# Patient Record
Sex: Female | Born: 1935 | Race: Black or African American | Hispanic: No | Smoking: Never smoker
Health system: Southern US, Community
[De-identification: ages and names within clinical notes are randomized; demographics above are authoritative.]

## PROBLEM LIST (undated history)

## (undated) DIAGNOSIS — IMO0001 Reserved for inherently not codable concepts without codable children: Secondary | ICD-10-CM

## (undated) DIAGNOSIS — F32A Depression, unspecified: Secondary | ICD-10-CM

## (undated) DIAGNOSIS — I701 Atherosclerosis of renal artery: Secondary | ICD-10-CM

## (undated) DIAGNOSIS — M549 Dorsalgia, unspecified: Secondary | ICD-10-CM

## (undated) DIAGNOSIS — D649 Anemia, unspecified: Secondary | ICD-10-CM

## (undated) DIAGNOSIS — I2510 Atherosclerotic heart disease of native coronary artery without angina pectoris: Secondary | ICD-10-CM

## (undated) DIAGNOSIS — F329 Major depressive disorder, single episode, unspecified: Secondary | ICD-10-CM

## (undated) DIAGNOSIS — K219 Gastro-esophageal reflux disease without esophagitis: Secondary | ICD-10-CM

## (undated) DIAGNOSIS — G8929 Other chronic pain: Secondary | ICD-10-CM

## (undated) DIAGNOSIS — E785 Hyperlipidemia, unspecified: Secondary | ICD-10-CM

## (undated) DIAGNOSIS — Z9049 Acquired absence of other specified parts of digestive tract: Secondary | ICD-10-CM

## (undated) DIAGNOSIS — Z794 Long term (current) use of insulin: Secondary | ICD-10-CM

## (undated) DIAGNOSIS — E119 Type 2 diabetes mellitus without complications: Secondary | ICD-10-CM

## (undated) DIAGNOSIS — I509 Heart failure, unspecified: Secondary | ICD-10-CM

## (undated) DIAGNOSIS — Z9089 Acquired absence of other organs: Secondary | ICD-10-CM

## (undated) DIAGNOSIS — I10 Essential (primary) hypertension: Secondary | ICD-10-CM

## (undated) DIAGNOSIS — G44209 Tension-type headache, unspecified, not intractable: Secondary | ICD-10-CM

## (undated) HISTORY — DX: Tension-type headache, unspecified, not intractable: G44.209

## (undated) HISTORY — DX: Hyperlipidemia, unspecified: E78.5

## (undated) HISTORY — DX: Reserved for inherently not codable concepts without codable children: IMO0001

## (undated) HISTORY — DX: Atherosclerosis of renal artery: I70.1

## (undated) HISTORY — DX: Acquired absence of other specified parts of digestive tract: Z90.49

## (undated) HISTORY — DX: Atherosclerotic heart disease of native coronary artery without angina pectoris: I25.10

## (undated) HISTORY — PX: TONSILLECTOMY: SUR1361

## (undated) HISTORY — DX: Dorsalgia, unspecified: M54.9

## (undated) HISTORY — DX: Anemia, unspecified: D64.9

## (undated) HISTORY — DX: Depression, unspecified: F32.A

## (undated) HISTORY — DX: Essential (primary) hypertension: I10

## (undated) HISTORY — DX: Gastro-esophageal reflux disease without esophagitis: K21.9

## (undated) HISTORY — DX: Acquired absence of other organs: Z90.89

## (undated) HISTORY — DX: Other chronic pain: G89.29

## (undated) HISTORY — DX: Major depressive disorder, single episode, unspecified: F32.9

## (undated) HISTORY — PX: CHOLECYSTECTOMY: SHX55

## (undated) HISTORY — DX: Type 2 diabetes mellitus without complications: E11.9

## (undated) HISTORY — DX: Long term (current) use of insulin: Z79.4

---

## 1997-11-25 ENCOUNTER — Ambulatory Visit: Admission: RE | Admit: 1997-11-25 | Discharge: 1997-11-25 | Payer: Self-pay | Admitting: Emergency Medicine

## 1998-03-01 ENCOUNTER — Other Ambulatory Visit: Admission: RE | Admit: 1998-03-01 | Discharge: 1998-03-01 | Payer: Self-pay | Admitting: Obstetrics and Gynecology

## 1998-04-03 ENCOUNTER — Ambulatory Visit (HOSPITAL_COMMUNITY): Admission: RE | Admit: 1998-04-03 | Discharge: 1998-04-03 | Payer: Self-pay | Admitting: Obstetrics and Gynecology

## 1998-05-17 ENCOUNTER — Ambulatory Visit (HOSPITAL_COMMUNITY): Admission: RE | Admit: 1998-05-17 | Discharge: 1998-05-17 | Payer: Self-pay | Admitting: Gastroenterology

## 1999-03-27 ENCOUNTER — Other Ambulatory Visit: Admission: RE | Admit: 1999-03-27 | Discharge: 1999-03-27 | Payer: Self-pay | Admitting: Internal Medicine

## 1999-11-13 ENCOUNTER — Emergency Department (HOSPITAL_COMMUNITY): Admission: EM | Admit: 1999-11-13 | Discharge: 1999-11-13 | Payer: Self-pay | Admitting: Emergency Medicine

## 2000-08-07 ENCOUNTER — Encounter: Admission: RE | Admit: 2000-08-07 | Discharge: 2000-08-07 | Payer: Self-pay | Admitting: Internal Medicine

## 2000-08-07 ENCOUNTER — Encounter: Payer: Self-pay | Admitting: Internal Medicine

## 2001-05-18 ENCOUNTER — Encounter: Payer: Self-pay | Admitting: Internal Medicine

## 2001-05-18 ENCOUNTER — Emergency Department (HOSPITAL_COMMUNITY): Admission: EM | Admit: 2001-05-18 | Discharge: 2001-05-18 | Payer: Self-pay | Admitting: Emergency Medicine

## 2001-08-10 ENCOUNTER — Encounter: Payer: Self-pay | Admitting: Internal Medicine

## 2001-08-10 ENCOUNTER — Encounter: Admission: RE | Admit: 2001-08-10 | Discharge: 2001-08-10 | Payer: Self-pay | Admitting: Internal Medicine

## 2002-08-26 ENCOUNTER — Encounter: Payer: Self-pay | Admitting: Internal Medicine

## 2002-08-26 ENCOUNTER — Encounter: Admission: RE | Admit: 2002-08-26 | Discharge: 2002-08-26 | Payer: Self-pay | Admitting: Internal Medicine

## 2002-09-25 ENCOUNTER — Emergency Department (HOSPITAL_COMMUNITY): Admission: EM | Admit: 2002-09-25 | Discharge: 2002-09-25 | Payer: Self-pay | Admitting: Emergency Medicine

## 2003-02-27 ENCOUNTER — Emergency Department (HOSPITAL_COMMUNITY): Admission: EM | Admit: 2003-02-27 | Discharge: 2003-02-27 | Payer: Self-pay | Admitting: Emergency Medicine

## 2003-02-27 ENCOUNTER — Encounter: Payer: Self-pay | Admitting: Emergency Medicine

## 2003-05-10 ENCOUNTER — Encounter: Payer: Self-pay | Admitting: General Surgery

## 2003-05-17 ENCOUNTER — Encounter: Payer: Self-pay | Admitting: General Surgery

## 2003-05-17 ENCOUNTER — Encounter (INDEPENDENT_AMBULATORY_CARE_PROVIDER_SITE_OTHER): Payer: Self-pay

## 2003-05-17 ENCOUNTER — Observation Stay (HOSPITAL_COMMUNITY): Admission: RE | Admit: 2003-05-17 | Discharge: 2003-05-18 | Payer: Self-pay | Admitting: General Surgery

## 2003-10-12 ENCOUNTER — Encounter: Admission: RE | Admit: 2003-10-12 | Discharge: 2003-10-12 | Payer: Self-pay | Admitting: Internal Medicine

## 2003-10-27 ENCOUNTER — Encounter: Admission: RE | Admit: 2003-10-27 | Discharge: 2003-10-27 | Payer: Self-pay | Admitting: Internal Medicine

## 2003-11-10 ENCOUNTER — Inpatient Hospital Stay (HOSPITAL_COMMUNITY): Admission: EM | Admit: 2003-11-10 | Discharge: 2003-11-12 | Payer: Self-pay | Admitting: Emergency Medicine

## 2004-09-17 ENCOUNTER — Ambulatory Visit: Payer: Self-pay | Admitting: Cardiology

## 2004-09-17 ENCOUNTER — Ambulatory Visit: Payer: Self-pay | Admitting: Internal Medicine

## 2004-09-17 ENCOUNTER — Inpatient Hospital Stay (HOSPITAL_COMMUNITY): Admission: AD | Admit: 2004-09-17 | Discharge: 2004-09-20 | Payer: Self-pay | Admitting: Internal Medicine

## 2004-09-26 ENCOUNTER — Ambulatory Visit: Payer: Self-pay

## 2004-12-05 ENCOUNTER — Encounter: Admission: RE | Admit: 2004-12-05 | Discharge: 2004-12-05 | Payer: Self-pay | Admitting: Internal Medicine

## 2005-01-23 ENCOUNTER — Ambulatory Visit: Payer: Self-pay | Admitting: Internal Medicine

## 2005-01-23 ENCOUNTER — Ambulatory Visit (HOSPITAL_COMMUNITY): Admission: RE | Admit: 2005-01-23 | Discharge: 2005-01-23 | Payer: Self-pay | Admitting: Internal Medicine

## 2005-03-20 ENCOUNTER — Ambulatory Visit: Payer: Self-pay | Admitting: Internal Medicine

## 2005-03-20 ENCOUNTER — Inpatient Hospital Stay (HOSPITAL_COMMUNITY): Admission: EM | Admit: 2005-03-20 | Discharge: 2005-03-23 | Payer: Self-pay | Admitting: Emergency Medicine

## 2005-03-22 ENCOUNTER — Ambulatory Visit: Payer: Self-pay | Admitting: Cardiology

## 2005-03-28 ENCOUNTER — Ambulatory Visit: Payer: Self-pay | Admitting: Cardiology

## 2005-04-01 ENCOUNTER — Ambulatory Visit: Payer: Self-pay | Admitting: Cardiology

## 2005-05-20 ENCOUNTER — Ambulatory Visit: Payer: Self-pay | Admitting: Cardiology

## 2005-05-29 ENCOUNTER — Ambulatory Visit: Payer: Self-pay | Admitting: Internal Medicine

## 2005-06-24 ENCOUNTER — Ambulatory Visit: Payer: Self-pay | Admitting: Cardiology

## 2005-07-18 ENCOUNTER — Ambulatory Visit: Payer: Self-pay | Admitting: Internal Medicine

## 2005-09-04 ENCOUNTER — Ambulatory Visit: Payer: Self-pay | Admitting: Internal Medicine

## 2005-09-11 ENCOUNTER — Ambulatory Visit: Payer: Self-pay | Admitting: Cardiology

## 2005-11-26 ENCOUNTER — Inpatient Hospital Stay (HOSPITAL_COMMUNITY): Admission: EM | Admit: 2005-11-26 | Discharge: 2005-11-28 | Payer: Self-pay | Admitting: Emergency Medicine

## 2005-11-26 ENCOUNTER — Ambulatory Visit: Payer: Self-pay | Admitting: Cardiology

## 2005-12-02 ENCOUNTER — Ambulatory Visit: Payer: Self-pay | Admitting: Internal Medicine

## 2006-02-05 ENCOUNTER — Encounter: Admission: RE | Admit: 2006-02-05 | Discharge: 2006-02-05 | Payer: Self-pay | Admitting: Internal Medicine

## 2006-02-05 LAB — HM MAMMOGRAPHY: HM Mammogram: NORMAL

## 2006-03-31 ENCOUNTER — Ambulatory Visit: Payer: Self-pay | Admitting: Internal Medicine

## 2006-06-19 ENCOUNTER — Ambulatory Visit: Payer: Self-pay | Admitting: Cardiology

## 2006-06-23 ENCOUNTER — Ambulatory Visit: Payer: Self-pay | Admitting: Internal Medicine

## 2006-09-11 ENCOUNTER — Ambulatory Visit: Payer: Self-pay | Admitting: Internal Medicine

## 2006-09-11 LAB — CONVERTED CEMR LAB
Rheumatoid Fact: <20 intl units/mL — ABNORMAL LOW (ref 0.0–20.0)
Total CK: 188 units/L — ABNORMAL HIGH (ref 7–177)

## 2006-10-01 ENCOUNTER — Ambulatory Visit: Payer: Self-pay | Admitting: Internal Medicine

## 2007-01-14 ENCOUNTER — Ambulatory Visit: Payer: Self-pay | Admitting: Internal Medicine

## 2007-02-08 ENCOUNTER — Emergency Department (HOSPITAL_COMMUNITY): Admission: EM | Admit: 2007-02-08 | Discharge: 2007-02-08 | Payer: Self-pay | Admitting: Emergency Medicine

## 2007-02-12 ENCOUNTER — Ambulatory Visit: Payer: Self-pay | Admitting: Internal Medicine

## 2007-03-26 ENCOUNTER — Ambulatory Visit: Payer: Self-pay | Admitting: Internal Medicine

## 2007-03-30 ENCOUNTER — Encounter: Payer: Self-pay | Admitting: Internal Medicine

## 2007-03-30 LAB — CONVERTED CEMR LAB
Collection Interval-CRCL: 24 hr
Creatinine Clearance: 38 mL/min — ABNORMAL LOW (ref 75–115)
Total Protein: 7.5 g/dL (ref 6.0–8.3)

## 2007-07-24 ENCOUNTER — Encounter: Payer: Self-pay | Admitting: *Deleted

## 2007-07-24 DIAGNOSIS — I10 Essential (primary) hypertension: Secondary | ICD-10-CM | POA: Insufficient documentation

## 2007-07-24 DIAGNOSIS — F329 Major depressive disorder, single episode, unspecified: Secondary | ICD-10-CM

## 2007-07-24 DIAGNOSIS — E785 Hyperlipidemia, unspecified: Secondary | ICD-10-CM | POA: Insufficient documentation

## 2007-07-24 DIAGNOSIS — Z794 Long term (current) use of insulin: Secondary | ICD-10-CM

## 2007-07-24 DIAGNOSIS — K219 Gastro-esophageal reflux disease without esophagitis: Secondary | ICD-10-CM

## 2007-07-24 DIAGNOSIS — E119 Type 2 diabetes mellitus without complications: Secondary | ICD-10-CM

## 2007-07-24 DIAGNOSIS — Z9089 Acquired absence of other organs: Secondary | ICD-10-CM

## 2007-09-09 ENCOUNTER — Encounter: Payer: Self-pay | Admitting: Internal Medicine

## 2007-09-17 ENCOUNTER — Ambulatory Visit: Payer: Self-pay | Admitting: Internal Medicine

## 2007-09-17 DIAGNOSIS — M549 Dorsalgia, unspecified: Secondary | ICD-10-CM | POA: Insufficient documentation

## 2007-09-17 DIAGNOSIS — D649 Anemia, unspecified: Secondary | ICD-10-CM | POA: Insufficient documentation

## 2007-09-17 DIAGNOSIS — M109 Gout, unspecified: Secondary | ICD-10-CM

## 2007-09-17 LAB — CONVERTED CEMR LAB
Alkaline Phosphatase: 216 units/L — ABNORMAL HIGH (ref 39–117)
BUN: 41 mg/dL — ABNORMAL HIGH (ref 6–23)
Basophils Relative: 0.2 % (ref 0.0–1.0)
Bilirubin, Direct: 0.1 mg/dL (ref 0.0–0.3)
CO2: 29 meq/L (ref 19–32)
Eosinophils Absolute: 0.2 10*3/uL (ref 0.0–0.6)
GFR calc Af Amer: 35 mL/min
GFR calc non Af Amer: 29 mL/min
Glucose, Bld: 61 mg/dL — ABNORMAL LOW (ref 70–99)
Hemoglobin: 9.9 g/dL — ABNORMAL LOW (ref 12.0–15.0)
Lymphocytes Relative: 19.4 % (ref 12.0–46.0)
MCV: 84.4 fL (ref 78.0–100.0)
Monocytes Absolute: 0.8 10*3/uL — ABNORMAL HIGH (ref 0.2–0.7)
Monocytes Relative: 10.7 % (ref 3.0–11.0)
Neutro Abs: 5 10*3/uL (ref 1.4–7.7)
Potassium: 4.4 meq/L (ref 3.5–5.1)
Total Protein: 7.4 g/dL (ref 6.0–8.3)

## 2007-09-18 ENCOUNTER — Encounter: Payer: Self-pay | Admitting: Internal Medicine

## 2007-10-26 ENCOUNTER — Ambulatory Visit: Payer: Self-pay | Admitting: Internal Medicine

## 2007-12-08 ENCOUNTER — Encounter: Payer: Self-pay | Admitting: Internal Medicine

## 2008-02-02 ENCOUNTER — Ambulatory Visit: Payer: Self-pay | Admitting: Endocrinology

## 2008-02-02 DIAGNOSIS — R109 Unspecified abdominal pain: Secondary | ICD-10-CM | POA: Insufficient documentation

## 2008-02-03 LAB — CONVERTED CEMR LAB
ALT: 166 units/L — ABNORMAL HIGH (ref 0–35)
AST: 170 units/L — ABNORMAL HIGH (ref 0–37)
Albumin: 3.9 g/dL (ref 3.5–5.2)
Amylase: 162 units/L — ABNORMAL HIGH (ref 27–131)
BUN: 47 mg/dL — ABNORMAL HIGH (ref 6–23)
Basophils Relative: 0.3 % (ref 0.0–1.0)
CO2: 30 meq/L (ref 19–32)
Chloride: 109 meq/L (ref 96–112)
Creatinine, Ser: 1.7 mg/dL — ABNORMAL HIGH (ref 0.4–1.2)
Eosinophils Relative: 2.4 % (ref 0.0–5.0)
Glucose, Bld: 100 mg/dL — ABNORMAL HIGH (ref 70–99)
Lymphocytes Relative: 25.7 % (ref 12.0–46.0)
Neutrophils Relative %: 60.6 % (ref 43.0–77.0)
RBC: 3.81 M/uL — ABNORMAL LOW (ref 3.87–5.11)
Total Protein: 7 g/dL (ref 6.0–8.3)
WBC: 5.5 10*3/uL (ref 4.5–10.5)

## 2008-02-10 ENCOUNTER — Encounter: Admission: RE | Admit: 2008-02-10 | Discharge: 2008-02-10 | Payer: Self-pay | Admitting: Endocrinology

## 2008-02-17 ENCOUNTER — Encounter: Payer: Self-pay | Admitting: Internal Medicine

## 2008-04-12 ENCOUNTER — Ambulatory Visit: Payer: Self-pay | Admitting: Cardiology

## 2008-04-12 LAB — CONVERTED CEMR LAB
GFR calc Af Amer: 33 mL/min
GFR calc non Af Amer: 27 mL/min
Glucose, Bld: 57 mg/dL — ABNORMAL LOW (ref 70–99)
HCT: 32.7 % — ABNORMAL LOW (ref 36.0–46.0)
Lymphocytes Relative: 22.7 % (ref 12.0–46.0)
Monocytes Absolute: 0.7 10*3/uL (ref 0.1–1.0)
Monocytes Relative: 10 % (ref 3.0–12.0)
Platelets: 198 10*3/uL (ref 150–400)
Potassium: 4.1 meq/L (ref 3.5–5.1)
RDW: 24.4 % — ABNORMAL HIGH (ref 11.5–14.6)
Sodium: 144 meq/L (ref 135–145)

## 2008-04-13 ENCOUNTER — Encounter: Payer: Self-pay | Admitting: Internal Medicine

## 2008-04-25 ENCOUNTER — Encounter: Payer: Self-pay | Admitting: Cardiology

## 2008-04-25 ENCOUNTER — Encounter: Payer: Self-pay | Admitting: Internal Medicine

## 2008-04-25 ENCOUNTER — Ambulatory Visit: Payer: Self-pay

## 2008-05-19 ENCOUNTER — Ambulatory Visit: Payer: Self-pay

## 2008-06-13 ENCOUNTER — Inpatient Hospital Stay (HOSPITAL_COMMUNITY): Admission: EM | Admit: 2008-06-13 | Discharge: 2008-06-24 | Payer: Self-pay | Admitting: Emergency Medicine

## 2008-06-13 ENCOUNTER — Ambulatory Visit: Payer: Self-pay | Admitting: Critical Care Medicine

## 2008-06-13 ENCOUNTER — Ambulatory Visit: Payer: Self-pay | Admitting: Cardiology

## 2008-06-13 ENCOUNTER — Ambulatory Visit: Payer: Self-pay | Admitting: Internal Medicine

## 2008-07-13 ENCOUNTER — Encounter: Payer: Self-pay | Admitting: Internal Medicine

## 2008-07-22 ENCOUNTER — Ambulatory Visit: Payer: Self-pay | Admitting: Internal Medicine

## 2008-07-22 DIAGNOSIS — G44209 Tension-type headache, unspecified, not intractable: Secondary | ICD-10-CM

## 2008-07-23 ENCOUNTER — Telehealth: Payer: Self-pay | Admitting: Internal Medicine

## 2008-08-15 ENCOUNTER — Ambulatory Visit: Payer: Self-pay | Admitting: Cardiovascular Disease

## 2008-08-25 ENCOUNTER — Encounter: Payer: Self-pay | Admitting: Internal Medicine

## 2008-11-25 ENCOUNTER — Encounter: Payer: Self-pay | Admitting: Internal Medicine

## 2008-12-05 ENCOUNTER — Telehealth: Payer: Self-pay | Admitting: Internal Medicine

## 2008-12-26 ENCOUNTER — Encounter: Payer: Self-pay | Admitting: Internal Medicine

## 2009-02-10 DIAGNOSIS — I701 Atherosclerosis of renal artery: Secondary | ICD-10-CM

## 2009-02-16 ENCOUNTER — Ambulatory Visit: Payer: Self-pay | Admitting: Cardiovascular Disease

## 2009-02-16 DIAGNOSIS — I2510 Atherosclerotic heart disease of native coronary artery without angina pectoris: Secondary | ICD-10-CM | POA: Insufficient documentation

## 2009-02-21 ENCOUNTER — Encounter: Payer: Self-pay | Admitting: Internal Medicine

## 2009-04-18 ENCOUNTER — Encounter: Payer: Self-pay | Admitting: Internal Medicine

## 2009-04-26 LAB — HM DIABETES EYE EXAM

## 2009-06-13 ENCOUNTER — Encounter: Payer: Self-pay | Admitting: Internal Medicine

## 2009-07-18 ENCOUNTER — Ambulatory Visit: Payer: Self-pay | Admitting: Internal Medicine

## 2009-07-19 DIAGNOSIS — N183 Chronic kidney disease, stage 3 (moderate): Secondary | ICD-10-CM

## 2009-07-19 DIAGNOSIS — H409 Unspecified glaucoma: Secondary | ICD-10-CM | POA: Insufficient documentation

## 2009-08-07 ENCOUNTER — Encounter: Payer: Self-pay | Admitting: Internal Medicine

## 2009-09-06 ENCOUNTER — Ambulatory Visit: Payer: Self-pay | Admitting: Cardiovascular Disease

## 2009-09-12 ENCOUNTER — Encounter: Payer: Self-pay | Admitting: Internal Medicine

## 2009-11-09 ENCOUNTER — Encounter: Payer: Self-pay | Admitting: Internal Medicine

## 2010-01-03 ENCOUNTER — Encounter: Payer: Self-pay | Admitting: Internal Medicine

## 2010-02-27 ENCOUNTER — Encounter: Payer: Self-pay | Admitting: Internal Medicine

## 2010-03-13 ENCOUNTER — Ambulatory Visit: Payer: Self-pay | Admitting: Cardiovascular Disease

## 2010-04-11 ENCOUNTER — Encounter: Payer: Self-pay | Admitting: Internal Medicine

## 2010-09-27 ENCOUNTER — Ambulatory Visit: Payer: Self-pay | Admitting: Cardiovascular Disease

## 2010-09-27 ENCOUNTER — Encounter: Payer: Self-pay | Admitting: Cardiovascular Disease

## 2010-09-27 DIAGNOSIS — R0602 Shortness of breath: Secondary | ICD-10-CM | POA: Insufficient documentation

## 2010-09-27 LAB — CONVERTED CEMR LAB
AST: 29 units/L (ref 0–37)
Albumin: 4.3 g/dL (ref 3.5–5.2)
BUN: 41 mg/dL — ABNORMAL HIGH (ref 6–23)
Calcium: 9.9 mg/dL (ref 8.4–10.5)
Cholesterol: 157 mg/dL (ref 0–200)
GFR calc non Af Amer: 37.13 mL/min — ABNORMAL LOW (ref >60.00)
Glucose, Bld: 87 mg/dL (ref 70–99)
HDL: 37 mg/dL — ABNORMAL LOW (ref >39.00)
Potassium: 4.3 meq/L (ref 3.5–5.1)
Sodium: 143 meq/L (ref 135–145)
Total Bilirubin: 0.7 mg/dL (ref 0.3–1.2)
Total CHOL/HDL Ratio: 4
Triglycerides: 233 mg/dL — ABNORMAL HIGH (ref 0.0–149.0)
VLDL: 46.6 mg/dL — ABNORMAL HIGH (ref 0.0–40.0)

## 2010-10-17 ENCOUNTER — Other Ambulatory Visit: Payer: Self-pay | Admitting: Cardiovascular Disease

## 2010-10-17 ENCOUNTER — Ambulatory Visit: Admission: RE | Admit: 2010-10-17 | Discharge: 2010-10-17 | Payer: Self-pay | Source: Home / Self Care

## 2010-10-17 ENCOUNTER — Ambulatory Visit (HOSPITAL_COMMUNITY)
Admission: RE | Admit: 2010-10-17 | Discharge: 2010-10-17 | Payer: Self-pay | Source: Home / Self Care | Attending: Cardiovascular Disease | Admitting: Cardiovascular Disease

## 2010-10-18 ENCOUNTER — Encounter: Payer: Self-pay | Admitting: Internal Medicine

## 2010-11-04 ENCOUNTER — Encounter: Payer: Self-pay | Admitting: Internal Medicine

## 2010-11-04 ENCOUNTER — Encounter: Payer: Self-pay | Admitting: Endocrinology

## 2010-11-15 NOTE — Assessment & Plan Note (Signed)
Summary: F1Y   Visit Type:  Follow-up Primary Provider:  Jacques Navy MD  CC:  ROV; SOB/DOE.  History of Present Illness: [name] is a pleasant 77 year old African American female with a past medical history significant for hypertension, hyperlipidemia, known coronary artery disease status post placement of a drug-eluting stent in the LAD in 2006, renal artery stenosis, diabetes mellitus, anemia, GERD, osteoarthritis, and chronic kidney disease who presents today for routine cardiac follow up. She has been doing well except for her chronic back pain. She has had no chest pain, SOB, palpitations, near syncope or syncope.  She does have mild dyspnea with exertion.  She has no lower ext edema. Overall, feeling well. Her back is her only complaint. I reviewed old records. She had imaging of the L spine in 2008 and had OA/DDD. She has been using Tramadol as needed per Dr. Debby Bud.     Current Medications (verified): 1)  Furosemide 80 Mg  Tabs (Furosemide) .... Take One and One-Half Tablets Daily 2)  Gabapentin 600 Mg  Tabs (Gabapentin) .... Take One Tablet 3 Atimes Daily 3)  Colchicine 0.6 Mg  Tabs (Colchicine) .Marland Kitchen.. 1 Q 2 Hours As Needed For Gout Flare. 4)  Diazepam 5 Mg  Tabs (Diazepam) .... Take As Needed 5)  Nitroquick 0.4 Mg  Subl (Nitroglycerin) .... Take Sl As Needed For Chest Pain 6)  Lantus 100 Unit/ml  Soln (Insulin Glargine) .... 36 Units At Bedtime 7)  Lumigan 0.03 %  Soln (Bimatoprost) .... As Directed 8)  Lactulose   Soln (Lactulose) .... 60 Cc Two Times A Day.  Disp X 14 Days 9)  Hydralazine Hcl 100 Mg Tabs (Hydralazine Hcl) .... Take One Tablet By Mouth Three Times A Day 10)  Metoprolol Tartrate 25 Mg Tabs (Metoprolol Tartrate) .... Take 3 Tablets By Mouth Two Times A Day 11)  Pravastatin Sodium 80 Mg Tabs (Pravastatin Sodium) .... Take One Tablet By Mouth Daily At Bedtime 12)  Zemplar 1 Mcg Caps (Paricalcitol) .... Take 1 By Mouth Once Daily 13)  Humalog 100 Unit/ml Soln  (Insulin Lispro (Human)) .... As Directed 14)  Aspirin 81 Mg Tbec (Aspirin) .... Take One Tablet By Mouth Daily 15)  Calcitriol 0.25 Mcg Caps (Calcitriol) .Marland Kitchen.. 1 Cap Once Daily 16)  Allopurinol 300 Mg Tabs (Allopurinol) .... Take 1 By Mouth Once Daily 17)  Citalopram Hydrobromide 20 Mg Tabs (Citalopram Hydrobromide) .... Take 1 By Mouth Once Daily  Allergies (verified): No Known Drug Allergies  Past History:  Past Medical History: Reviewed history from 07/18/2009 and no changes required. RENAL ARTERY STENOSIS (ICD-440.1) CORONARY ARTERY DISEASE (ICD-414.00) HYPERTENSION (ICD-401.9) DYSLIPIDEMIA (ICD-272.4) GERD (ICD-530.81) HEADACHE, TENSION (ICD-307.81) ABDOMINAL PAIN (ICD-789.00) UNSPECIFIED ANEMIA (ICD-285.9) GOUT (ICD-274.9) BACK PAIN, CHRONIC (ICD-724.5) TONSILLECTOMY, HX OF (ICD-V45.79) CHOLECYSTECTOMY, HX OF (ICD-V45.79) DEPRESSION (ICD-311) IDDM (ICD-250.01)   Physician Roster:             Endo- Kumar             cardo - McAlhaney             opthal - Groat               Social History: Reviewed history from 09/17/2007 and no changes required. 8th grade - went to work married over 50 years 4 sons ; 2 daughters retired.  Review of Systems       The patient complains of joint pain.  The patient denies fatigue, malaise, fever, weight gain/loss, vision loss, decreased hearing, hoarseness, chest  pain, palpitations, shortness of breath, prolonged cough, wheezing, sleep apnea, coughing up blood, abdominal pain, blood in stool, nausea, vomiting, diarrhea, heartburn, incontinence, blood in urine, muscle weakness, leg swelling, rash, skin lesions, headache, fainting, dizziness, depression, anxiety, enlarged lymph nodes, easy bruising or bleeding, and environmental allergies.    Vital Signs:  Patient profile:   77 year old female Height:      62 inches Weight:      217 pounds BMI:     39.83 Pulse rate:   59 / minute Pulse rhythm:   regular BP sitting:   130 / 60   (left arm) Cuff size:   large  Vitals Entered By: Stanton Kidney, EMT-P (September 27, 2010 9:01 AM)  Physical Exam  General:  General: Well developed, well nourished, NAD Musculoskeletal: Muscle strength 5/5 all ext Psychiatric: Mood and affect normal Neck: No JVD Lungs:Clear bilaterally, no wheezes, rhonci, crackles CV: RRR no murmurs, gallops rubs Abdomen: soft, NT, ND, BS present Extremities: Trace bilateral lower ext  edema, pulses 2+.   EKG  Procedure date:  09/27/2010  Findings:      Sinus brady, o/w normal.   Impression & Recommendations:  Problem # 1:  CAD, NATIVE VESSEL (ICD-414.01) Stable. Continue ASA, statin, beta blocker.  Will check echo to assess LV function.   Her updated medication list for this problem includes:    Nitroquick 0.4 Mg Subl (Nitroglycerin) .Marland Kitchen... Take sl as needed for chest pain    Metoprolol Tartrate 25 Mg Tabs (Metoprolol tartrate) .Marland Kitchen... Take 3 tablets by mouth two times a day    Aspirin 81 Mg Tbec (Aspirin) .Marland Kitchen... Take one tablet by mouth daily  Orders: TLB-BMP (Basic Metabolic Panel-BMET) (80048-METABOL) TLB-Hepatic/Liver Function Pnl (80076-HEPATIC) TLB-Lipid Panel (80061-LIPID) Echocardiogram (Echo) EKG w/ Interpretation (93000)  Problem # 2:  DYSPNEA (ICD-786.05) Echo as above.   The following medications were removed from the medication list:    Aldactone 25 Mg Tabs (Spironolactone) .Marland Kitchen... Take 1 tablet by mouth every other day Her updated medication list for this problem includes:    Furosemide 80 Mg Tabs (Furosemide) .Marland Kitchen... Take one and one-half tablets daily    Metoprolol Tartrate 25 Mg Tabs (Metoprolol tartrate) .Marland Kitchen... Take 3 tablets by mouth two times a day    Aspirin 81 Mg Tbec (Aspirin) .Marland Kitchen... Take one tablet by mouth daily  Orders: TLB-BMP (Basic Metabolic Panel-BMET) (80048-METABOL) TLB-Hepatic/Liver Function Pnl (80076-HEPATIC) TLB-Lipid Panel (80061-LIPID) Echocardiogram (Echo) EKG w/ Interpretation (93000)  Problem  # 3:  HYPERTENSION (ICD-401.9) BP well controlled.   The following medications were removed from the medication list:    Aldactone 25 Mg Tabs (Spironolactone) .Marland Kitchen... Take 1 tablet by mouth every other day Her updated medication list for this problem includes:    Furosemide 80 Mg Tabs (Furosemide) .Marland Kitchen... Take one and one-half tablets daily    Hydralazine Hcl 100 Mg Tabs (Hydralazine hcl) .Marland Kitchen... Take one tablet by mouth three times a day    Metoprolol Tartrate 25 Mg Tabs (Metoprolol tartrate) .Marland Kitchen... Take 3 tablets by mouth two times a day    Aspirin 81 Mg Tbec (Aspirin) .Marland Kitchen... Take one tablet by mouth daily  Problem # 4:  DYSLIPIDEMIA (ICD-272.4) Check lipids, BMET, LFTs today.   Her updated medication list for this problem includes:    Pravastatin Sodium 80 Mg Tabs (Pravastatin sodium) .Marland Kitchen... Take one tablet by mouth daily at bedtime  Patient Instructions: 1)  Your physician recommends that you schedule a follow-up appointment in: 6 months. 2)  Your physician  recommends that you continue on your current medications as directed. Please refer to the Current Medication list given to you today. 3)  Your physician has requested that you have an echocardiogram.  Echocardiography is a painless test that uses sound waves to create images of your heart. It provides your doctor with information about the size and shape of your heart and how well your heart's chambers and valves are working.  This procedure takes approximately one hour. There are no restrictions for this procedure. Prescriptions: NITROQUICK 0.4 MG  SUBL (NITROGLYCERIN) Take sl as needed for chest pain  #21 x 3   Entered by:   Stanton Kidney, EMT-P   Authorized by:   Verne Carrow, MD   Signed by:   Stanton Kidney, EMT-P on 09/27/2010   Method used:   Electronically to        [facility] [phone]* (retail)       [address]       [place], [place]  [id number]       Ph: [phone] or [phone]        Fax: [phone]   RxID:   [id number]

## 2010-11-15 NOTE — Assessment & Plan Note (Signed)
Summary: 6 month rov/sl   Visit Type:  6 mo f/u Primary Provider:  Norins  CC:  dizziness.  History of Present Illness: [name] is a pleasant 77 year old African American female with a past medical history significant for hypertension, hyperlipidemia, known coronary artery disease status post placement of a drug-eluting stent in the LAD in 2006, renal artery stenosis, diabetes mellitus, anemia, GERD, osteoarthritis, and chronic kidney disease who presents today for routine cardiac follow up. She has been doing well except for her chronic back pain. She has had no chest pain, SOB, palpitations, near syncope or syncope.  She has rare episodes of dizziness when cleaning her house. Overall, feeling well.   At the last visit, she was found to be on Labetalol and Lopressor. I asked her to stop the Labetalol but she has continued to take this medication. Her HR has been in the low 60s.   Recent LDL 83 in Dr. Emelda Brothers office. Liver function normal. Creatinine 1.9.     Current Medications (verified): 1)  Furosemide 80 Mg  Tabs (Furosemide) .... Take One and One-Half Tablets Daily 2)  Gabapentin 600 Mg  Tabs (Gabapentin) .... Take One Tablet 3 Atimes Daily 3)  Colchicine 0.6 Mg  Tabs (Colchicine) .Marland Kitchen.. 1 Q 2 Hours As Needed For Gout Flare. 4)  Diazepam 5 Mg  Tabs (Diazepam) .... Take As Needed 5)  Nitroquick 0.4 Mg  Subl (Nitroglycerin) .... Take Sl As Needed For Chest Pain 6)  Lantus 100 Unit/ml  Soln (Insulin Glargine) .... 36 Units At Bedtime 7)  Aldactone 25 Mg  Tabs (Spironolactone) .... Take 1 Tablet By Mouth Every Other Day 8)  Lumigan 0.03 %  Soln (Bimatoprost) .... As Directed 9)  Lactulose   Soln (Lactulose) .... 60 Cc Two Times A Day.  Disp X 14 Days 10)  Hydralazine Hcl 100 Mg Tabs (Hydralazine Hcl) .... Take One Tablet By Mouth Three Times A Day 11)  Metoprolol Tartrate 25 Mg Tabs (Metoprolol Tartrate) .... Take 3 Tablets By Mouth Two Times A Day 12)  Pravastatin Sodium 40 Mg Tabs  (Pravastatin Sodium) .... Take One Tablet By Mouth Daily At Bedtime 13)  Zemplar 1 Mcg Caps (Paricalcitol) .... Take 1 By Mouth Once Daily 14)  Tramadol Hcl 50 Mg Tabs (Tramadol Hcl) .Marland Kitchen.. 1 By Mouth Q6 As Needed Uncontrolled Back Pain. 15)  Humalog 100 Unit/ml Soln (Insulin Lispro (Human)) .... As Directed 16)  Aspirin 81 Mg Tbec (Aspirin) .... Take One Tablet By Mouth Daily 17)  Calcitriol 0.25 Mcg Caps (Calcitriol) .Marland Kitchen.. 1 Cap Once Daily  Allergies (verified): No Known Drug Allergies  Past History:  Past Medical History: Reviewed history from 07/18/2009 and no changes required. RENAL ARTERY STENOSIS (ICD-440.1) CORONARY ARTERY DISEASE (ICD-414.00) HYPERTENSION (ICD-401.9) DYSLIPIDEMIA (ICD-272.4) GERD (ICD-530.81) HEADACHE, TENSION (ICD-307.81) ABDOMINAL PAIN (ICD-789.00) UNSPECIFIED ANEMIA (ICD-285.9) GOUT (ICD-274.9) BACK PAIN, CHRONIC (ICD-724.5) TONSILLECTOMY, HX OF (ICD-V45.79) CHOLECYSTECTOMY, HX OF (ICD-V45.79) DEPRESSION (ICD-311) IDDM (ICD-250.01)   Physician Roster:             Endo- Kumar             cardo - McAlhaney             opthal - Groat               Social History: Reviewed history from 09/17/2007 and no changes required. 8th grade - went to work married over 50 years 4 sons ; 2 daughters retired.  Review of Systems  The patient complains of dizziness.  The patient denies fatigue, malaise, fever, weight gain/loss, vision loss, decreased hearing, hoarseness, chest pain, palpitations, shortness of breath, prolonged cough, wheezing, sleep apnea, coughing up blood, abdominal pain, blood in stool, nausea, vomiting, diarrhea, heartburn, incontinence, blood in urine, muscle weakness, joint pain, leg swelling, rash, skin lesions, headache, fainting, depression, anxiety, enlarged lymph nodes, easy bruising or bleeding, and environmental allergies.    Vital Signs:  Patient profile:   77 year old female Height:      63 inches Weight:      223  pounds BMI:     39.65 Pulse rate:   62 / minute Pulse rhythm:   regular BP sitting:   148 / 70  (left arm) Cuff size:   large  Vitals Entered By: Danielle Rankin, CMA (Mar 13, 2010 8:37 AM)  Physical Exam  General:  General: Well developed, well nourished, NAD Musculoskeletal: Muscle strength 5/5 all ext Psychiatric: Mood and affect normal Neck: No JVD Lungs:Clear bilaterally, no wheezes, rhonci, crackles CV: RRR no murmurs, gallops rubs Abdomen: soft, NT, ND, BS present Extremities: Trace bilateral lower ext  edema, pulses 2+.    Impression & Recommendations:  Problem # 1:  CAD, NATIVE VESSEL (ICD-414.01) Stable. Continue ASA, Lopressor, statin. We have asked her to remove the Labetalol from her pill bag.   Her updated medication list for this problem includes:    Nitroquick 0.4 Mg Subl (Nitroglycerin) .Marland Kitchen... Take sl as needed for chest pain    Metoprolol Tartrate 25 Mg Tabs (Metoprolol tartrate) .Marland Kitchen... Take 3 tablets by mouth two times a day    Aspirin 81 Mg Tbec (Aspirin) .Marland Kitchen... Take one tablet by mouth daily  Problem # 2:  HYPERTENSION (ICD-401.9) BP relatively well controlled. Will stop Labetalol today secondary to relative bradycardia with dizziness at home. She will have follow up next week as scheduled with Dr. Debby Bud.   Her updated medication list for this problem includes:    Furosemide 80 Mg Tabs (Furosemide) .Marland Kitchen... Take one and one-half tablets daily    Aldactone 25 Mg Tabs (Spironolactone) .Marland Kitchen... Take 1 tablet by mouth every other day    Hydralazine Hcl 100 Mg Tabs (Hydralazine hcl) .Marland Kitchen... Take one tablet by mouth three times a day    Metoprolol Tartrate 25 Mg Tabs (Metoprolol tartrate) .Marland Kitchen... Take 3 tablets by mouth two times a day    Aspirin 81 Mg Tbec (Aspirin) .Marland Kitchen... Take one tablet by mouth daily  Patient Instructions: 1)  Your physician recommends that you schedule a follow-up appointment in: 6 months 2)  Your physician has recommended you make the following change  in your medication: Stop Labetalol

## 2010-11-15 NOTE — Letter (Signed)
Summary: [facility]  [facility]   Imported By: Sherian Rein 11/13/2009 11:49:10  _____________________________________________________________________  External Attachment:    Type:   Image     Comment:   External Document

## 2010-11-15 NOTE — Letter (Signed)
Summary: [facility]  [facility] Diabetes   Imported By: Sherian Rein 01/13/2010 09:16:48  _____________________________________________________________________  External Attachment:    Type:   Image     Comment:   External Document

## 2010-11-15 NOTE — Letter (Signed)
Summary: [name] MD  [name] MD   Imported By: Lennie Odor 04/17/2010 11:24:52  _____________________________________________________________________  External Attachment:    Type:   Image     Comment:   External Document

## 2010-11-15 NOTE — Letter (Signed)
Summary: [name] MD  [name] MD   Imported By: Lester Inola 03/08/2010 08:43:04  _____________________________________________________________________  External Attachment:    Type:   Image     Comment:   External Document

## 2010-11-15 NOTE — Letter (Signed)
Summary: [facility] Endocrinology [facility] Diabetes  [facility] Endocrinology & Diabetes   Imported By: Sherian Rein 10/30/2010 09:56:06  _____________________________________________________________________  External Attachment:    Type:   Image     Comment:   External Document

## 2011-01-22 ENCOUNTER — Other Ambulatory Visit: Payer: Self-pay | Admitting: Internal Medicine

## 2011-01-22 NOTE — Telephone Encounter (Signed)
Last seen in 2010! Who has been filling Rx up to now.

## 2011-01-22 NOTE — Telephone Encounter (Signed)
Ok for refills prn  

## 2011-01-22 NOTE — Telephone Encounter (Signed)
Please Advise refills 

## 2011-01-22 NOTE — Telephone Encounter (Signed)
Please Advise refills for gabapentin last filled in march.

## 2011-01-25 MED ORDER — GABAPENTIN 600 MG PO TABS: 600.0000 mg | ORAL_TABLET | Freq: Three times a day (TID) | ORAL | Status: DC

## 2011-01-25 NOTE — Telephone Encounter (Signed)
Ok for refillx x 11

## 2011-02-26 NOTE — Assessment & Plan Note (Signed)
[facility]                            CARDIOLOGY OFFICE NOTE   NAME:[name], [name]                     MRN:          [id number]  DATE:08/15/2008                            DOB:          June 28, 1928    PRIMARY CARE PHYSICIAN:  [name]. Norins, MD   HISTORY OF PRESENT ILLNESS:  [name] is a pleasant 77 year old  African American female with a past medical history significant for  hypertension, hyperlipidemia, known coronary artery disease status post  placement of a drug-eluting stent in the LAD in 2006, renal artery  stenosis, diabetes mellitus, anemia, GERD, osteoarthritis, and chronic  kidney disease who presents today for a return visit following discharge  from the hospital 2 months ago.  The patient was admitted to [facility] on June 13, 2008, with diabetic ketoacidosis.  The patient  was unresponsive at home and brought into the hospital where she was  found to have diabetic ketoacidosis with subsequent seizure activity and  respiratory failure.  The patient was discharged from hospital on  June 24, 2008.  During the hospitalization, she did have a mild  elevation of her cardiac enzymes and was seen by Cardiology for this  issue.  It was felt that the cardiac markers were most likely secondary  to her metabolic derangement with the diabetic ketoacidosis.  The  patient had normal stress Myoview performed in July 2009.  She did not  complain of any chest pain during the hospitalization and no further  cardiac workup was obtained at that time.   She comes in today and tells me that she has been doing well since her  discharge from the hospital.  She describes some weakness and fatigue;  however, her dizziness that she complained of at the time of the last  visit in this office has resolved.  She has had no recurrent chest pain,  shortness of breath, near syncope, syncope, orthopnea, PND, or change in  her chronic lower  extremity edema.   PAST MEDICAL HISTORY:  Unchanged and is outlined in detail above.   CURRENT MEDICATIONS:  1. Allopurinol 100 mg once daily.  2. Colchicine 0.6 mg once daily.  3. Spironolactone 25 mg once daily.  4. Tylenol once daily.  5. Gabapentin 600 mg 3 times daily.  6. Zemplar 1 mcg once daily.  7. Diazepam 5 mg once daily.  8. Furosemide 80 mg once daily.  9. Enteric-coated aspirin 81 mg once daily.  10.Amlodipine 10 mg once daily.  11.Hydralazine 100 mg 3 times daily.  12.Metoprolol tartrate 75 mg twice daily.  13.Dilantin 150 mg 3 times daily.  14.Omeprazole 20 mg once daily.   REVIEW OF SYSTEMS:  As stated in the history of present illness and is  otherwise negative.   PHYSICAL EXAMINATION:  VITALS:  Blood pressure 157/60, pulse 63 and  regular, respirations 12 and nonlabored.  GENERAL:  She is a pleasant elderly African American female who is in no  acute distress and is alert and oriented x3.  HEENT:  Normal.  NECK:  No JVD.  No carotid bruits.  No  thyromegaly.  No lymphadenopathy.  LUNGS:  Clear to auscultation bilaterally without wheezes, rhonchi, or  crackles noted.  CARDIOVASCULAR:  Regular rate and rhythm with no murmurs, gallops, or  rubs noted.  ABDOMEN:  Soft, nontender.  Bowel sounds are present.  EXTREMITIES:  There is trace to 1+ bilateral lower extremity edema that  is unchanged since her last visit in this office.  Pulses are 1+ in the  dorsalis pedis and posterior tibial arteries bilaterally.  Bilateral  radial artery pulses are 2+.   ASSESSMENT AND PLAN:  [name] is a pleasant 77 year old African  American female with a past medical history significant for diabetes  mellitus, hypertension, hyperlipidemia, chronic renal insufficiency, and  known coronary artery disease who presents today for routine cardiac  followup.  She describes no symptoms today that were suggestive of  angina, arrhythmias, or congestive heart failure.  She tells me  that she  is feeling much better than she did the last time she was seen in this  office.  She tells me that she was also recently seen by Dr. Debby Bud and  has followup scheduled in his office in several weeks.  I will not make  any changes in her cardiac medications at the current time.  Her blood  pressure is slightly elevated today; however, I would like to have her  had this rechecked when she sees Dr. Debby Bud in several weeks.  She is on  multiple medications for hypertension at the current time.  In regards  to her diabetes mellitus, she says that she has had good control of her  blood sugars recently and has had a home health nurse coming in her  house several times per week to help her out.  I would like to see her  back in this office in 6 months.  She is aware that she should let us  know if she has any change in her clinical status in the meantime.     Verne Carrow, MD  Electronically Signed    CM/MedQ  DD: 08/15/2008  DT: 08/16/2008  Job #: [id number]   cc:   [name]. Norins, MD

## 2011-02-26 NOTE — H&P (Signed)
NAME:  [name], [name]              ACCOUNT NO.:  [id number]   MEDICAL RECORD NO.:  [id number]          PATIENT TYPE:  INP   LOCATION:  2115                         FACILITY:  [facility]   PHYSICIAN:  Santina Evans A. Orlin Hilding, M.D.DATE OF BIRTH:  07-03-1928   DATE OF ADMISSION:  06/13/2008  DATE OF DISCHARGE:                              HISTORY & PHYSICAL   CHIEF COMPLAINT:  Altered mental status/seizure.   HISTORY OF PRESENT ILLNESS:  The patient is a 77 year old African  American woman with past medical history significant for diabetes  mellitus, CAD, HTN, HLD, and depression who presents to [facility] with  altered mental status.  The patient was found by her son to be  unresponsive around 10 am on June 13, 2008.  The patient was  unarousable when he was brought into the hospital.  The patient did have  several seizures and was intubated.  She was put on Dilantin and p.r.n.  Ativan.  The patient was found to be in DKA and was diagnosed with  sepsis.  No mention of possible source of infection, no prior history of  substance abuse, prior stroke, prior seizures, or medication  noncompliance.  Her daughter and patient's son was not sure if she was  taking her insulin or not. Neuro has been consulted for the altered  mental status and the seizures.   PAST MEDICAL HISTORY:  1. CAD.  2. Hypertension.  3. DM.  4. Gout.  5. Hyperlipidemia.  6. GERD.  7. Arthritis.  8. Depression.  9. Glaucoma.  10.Anemia.  11.Previous cholecystectomy in 2004.   ALLERGIES:  No known drug allergies.   HOME MEDICATIONS:  1. Neurontin 600 mg p.o. t.i.d.  2. Labetalol 300 mg b.i.d.  3. Colchicine 0.6 mg p.r.n.  4. Caduet 10/80 mg daily.  5. Allopurinol 100 mg daily.  6. Valium 5 mg.  7. Aldactone 25 mg daily.  8. Zemplar 1 mcg daily.  9. Lasix 80 mg daily.  10.Tylenol.  11.Aspirin.   FAMILY HISTORY:  Brother with CVA and a sister with CVA.   SOCIAL HISTORY:  Per daughter, she denies tobacco,  illicit drugs, or  alcohol usage.   REVIEW OF SYSTEMS:  As mentioned in the HPI.   PHYSICAL EXAMINATION:  VITAL SIGNS:  Temperature 98.8, pulse equals 77,  blood pressure equals 122/58, MAP of 81, O2 sat 100% on FIO2 of 30,  respiratory rate was 16.  Vent settings TRBC, FIO2 equals 30%, PEEP  equals 5, tidal volume equals 550, vent rate equals 16.  GENERAL:  Intubated, tracks with eyes, responses to pain, not following  commands, moves all 4s spontaneously.  HEENT:  AT, [place], intubated.  CV:  S1, S2, regular rate rhythm.  LUNGS:  With breath sounds.  ABDOMEN:  Soft, nontender, positive bowel sounds.  EYES:  Right eye blind, left eye was cataract, pupil responsive in the  left eye.  NEURO:  The patient opens eyes to commands and pain, moves all 4s to  painful stimuli, tracks with eye, not following commands.  No observable  asymmetric deficits.  The Babinski is also down going.  EXTREMITIES:  No edema.  SKIN:  No jaundice, cyanosis, or visible rashes.   ADMISSION LABS:  Sodium 122, potassium 6.5, chloride 104, bicarb of 9,  BUN 65, creatinine 2.9, glucose greater than 700.  Hemoglobin 15,  hematocrit 44, ionized calcium equals 1.18, wbc 13.1, platelets 138, ANC  10.8.  UA; cloudy, glucose greater than 1000, bili negative, ketones  equals 15, blood is moderate, proteins equals 100, nitrite negative,  leukocyte negative, wbc 0-2, rbc 0-2, Dilantin 8.5. Chest x-rays showed  ETT  in good position.  Low lung volumes with retrocardiac atelectasis.  CT of the head showed no acute or focal findings.   ASSESSMENT AND PLAN:  Status epilepticus, resolved.  CT of the head was  negative for any structural defects, acute bleed, or lesion.  Most  likely secondary to diabetic ketoacidosis.  Infection also is a  possibility given increased wbc's and fever to 103.   We will continue Dilantin, p.r.n. Ativan, agree with EEG, continue  antibiotics, and correct diabetic ketoacidosis.  Consider MRI if   symptoms persist after extubation/resolution of underlying  abnormalities.  We will also consider urine drug screen, although  unlikely cause of seizure given other more likely causes.  Consider  benzo withdrawal, although daughter reports the patient was not using  very often. If she responds quickly and returns to baseline without  further complications, would receommend continuing DIlantin for 3 to 6  months and then taper off.      Rufina Falco, M.D.  Electronically Signed      Gustavus Messing. Orlin Hilding, M.D.  Electronically Signed    JY/MEDQ  D:  06/14/2008  T:  06/15/2008  Job:  [id number]

## 2011-02-26 NOTE — Assessment & Plan Note (Signed)
[facility]                            CARDIOLOGY OFFICE NOTE   NAME:[name], [name]                     MRN:          [id number]  DATE:05/19/2008                            DOB:          07-Jan-1928    PRIMARY CARE PHYSICIAN:  [name]. Norins, MD   REASON FOR VISIT:  The patient with coronary artery disease following up  today to review results of recent stress test.   HISTORY OF PRESENT ILLNESS:  [name] is a pleasant 77 year old  African-American female with a past medical history significant for  diabetes mellitus, hypertension, and coronary artery disease, who  presents today to review the results of [name] stress test that was  performed 1 month ago.  [name] was last seen in our office on April 12, 2008, by my colleague, Dr. Nona Dell.  At that time, the patient  had complained of some chest discomfort and dizziness.  [name] does have a  history of coronary artery disease having presenting with unstable  angina in June 2006, at which time, [name] underwent placement of a drug-  eluting stent in the proximal left anterior descending coronary artery.  There had been no ischemic testing done since [name] heart catheterization.  A myocardial perfusion stress study was performed on April 25, 2008.  Adenosine was used for the stress portion of the test.  There was normal  contractility and thickening in all layers of the myocardium.  Overall,  left ventricular function appeared to be normal.  There was normal  perfusion of the myocardium with no evidence of ischemia.  The patient  also had a history of mild anemia.   LABORATORY DATA:  Laboratory values from [name] last visit showed that [name]  hemoglobin was 10.4 and hematocrit was 32.7.  [name] creatinine that had  been 2.2 at a prior test was down to 1.9.  Today, the patient states  that [name] continues to have occasional dizziness.  [name] describes this as  I get shaky when I am cleaning and moving things  around.  [name] denies  any near syncopal or syncopal episodes.  [name] also denies any chest pain,  shortness of breath, palpitations, diaphoresis, nausea, vomiting,  orthopnea, or paroxysmal nocturnal dyspnea.  [name] states that when [name]  becomes shaky while cleaning, [name] sits down and just becomes better  within a matter of few seconds.  [name] does not relate this dizziness to  rising from a seated position.  It seems to occur more when [name] is  bending over with [name] head facing down.  [name] also notes continued mild  swelling of [name] bilateral feet with a slight discoloration of [name] ankles  and feet.   CURRENT MEDICATIONS:  1. Allopurinol 100 mg once daily.  2. Colchicine 0.6 mg daily.  3. Aldactone 25 mg once daily.  4. Tylenol 1 tablet once daily.  5. Gabapentin 600 mg 3 times daily.  6. Zemplar 1 mcg once daily.  7. Diazepam 5 mg once daily.  8. Labetalol 300 mg twice daily.  9. Lasix 81 mg once daily.  10.Enteric-coated aspirin 81 mg  once daily.  11.Amlodipine 10 mg once daily.   PHYSICAL EXAMINATION:  GENERAL:  [name] is a pleasant, well-appearing,  elderly African-American female in no acute distress.  VITAL SIGNS:  [name] blood pressure is 137/65, pulse is 54 and regular, and  respirations are 12 and nonlabored.  NECK:  No JVD.  No carotid bruits noted.  SKIN:  Warm and dry.  There are venostasis changes noted over both lower  extremities extending from the ankle into the dorsum of the feet.  LUNGS:  Clear to auscultation bilaterally with no wheezes, rhonchi, or  crackles noted.  CARDIOVASCULAR:  Bradycardic with normal first and second heart sound.  No murmurs, gallops, or rubs are noted.  ABDOMEN:  Soft.  Bowel sounds are present.  EXTREMITIES:  There is trace to 1+ bilateral lower extremity edema  extending from the ankles into the dorsum of the feet.  There are also  notable venostasis changes in both ankles and both feet.  Pulses are 1-  2+ in the dorsalis pedis and posterior  tibial arteries bilaterally.  Bilateral radial artery pulses are 2+.   DIAGNOSTIC STUDIES:  1. 12-lead electrocardiogram obtained in our office today showed sinus      bradycardia.  There is a right axis deviation noted.  QRS duration      is normal at 84 msec.  PR interval is normal at 158 msec.      Ventricular rate is 54 beats per minute.  QT interval was corrected      at 432 msec.  There is baseline artifact noted on this EKG.  2. Laboratory values from May 12, 2008, showed a white blood cell      count of 6.7, hemoglobin 10.4, hematocrit 32.7, and platelets 198.      Sodium 144, potassium 4.1, creatinine 1.9, and BUN 33.  3. Myocardial perfusion stress study performed on April 25, 2008, with      adenosine shows no significant ST-segment change during adenosine      infusion.  There was normal left ventricular function noted and no      evidence of myocardial ischemia.   ASSESSMENT AND PLAN:  This is a pleasant 77 year old African-American  female with a past medical history significant for diabetes mellitus,  hypertension, and known coronary artery disease, status post placement  of a drug-eluting stent in [name] proximal left anterior descending  coronary artery in June 2006 who returns for a followup visit of [name]  stress test today.  There is no evidence of myocardial ischemia on [name]  stress test.  I think that some of [name] symptoms of shakiness while  working around the house may be related to [name] heart rate of 54 beats  per minute.  I have elected today to decrease the dose of [name] labetalol  to 150 mg twice daily.  I have also encouraged the patient to drink 8  glasses of water per day.  I do not feel that [name] lower extremity edema  is secondary to heart failure.  I have given [name] a prescription for  compression stockings to see if this helps with some venous return of  [name] lower extremities.  [name] understands this and also understands that  [name] should alert our office if [name] has  any changes in  [name] symptoms.  We will plan on seeing [name] on return to the office in  approximately 6 months.  We will continue [name] current medications with  exception of the change in the  labetalol dose.     Verne Carrow, MD  Electronically Signed    CM/MedQ  DD: 05/19/2008  DT: 05/20/2008  Job #: [id number]   cc:   Pricilla Riffle, MD, [facility]  [name]. Norins, MD

## 2011-02-26 NOTE — Procedures (Signed)
CLINICAL HISTORY:  The patient is a 77 year old admitted with altered  mental status, diabetic ketoacidosis, seizures, and acute respiratory  failure.  The patient has had coronary artery disease, hypertension,  hyperlipidemia, and depression.  She is found unresponsive by her son.  Study is being done to look for the presence of seizures. (780.02)   PROCEDURE:  The tracing is carried out on a 32-channel digital Cadwell  recorder reformatted into 16 channel montages with one devoted to EKG.  The patient was awake and drowsy during the recording.  The  International 10/20 system lead placement was used.  Medications include  heparin, Protonix, Dilantin, Zosyn, vancomycin, fentanyl, NovoLog,  Ativan, and Versed.  The International 10/20 system lead placement was  used.   DESCRIPTION OF FINDINGS:  The record begins with the patient drowsy.  Mixed frequency and bifrontal delta range activity of 30-40 microvolts  and centrally predominant 25-30 microvolt 4 Hz activity was seen.  The  patient arouses and has a transition through mixed frequency rhythmic  theta range activity finally to low-voltage alpha range activity with  beta range components.   There is no focal slowing in the background.  There was no interictal  epileptiform activity in the form of spikes or sharp waves.   EKG showed regular sinus rhythm with ventricular response of 72 beats  per minute.  Hyperventilation and photic stimulation were not carried  out in this portable study.   IMPRESSION:  Normal record with the patient awake and asleep.      Deanna Artis. Sharene Skeans, M.D.  Electronically Signed     ZOX:WRUE  D:  06/15/2008 17:18:51  T:  06/16/2008 06:16:20  Job #:  [id number]   cc:   Santina Evans A. Orlin Hilding, M.D.  Fax: [phone]   Charlcie Cradle. Delford Field, MD, FCCP  [address]  [place]  [place] [address]

## 2011-02-26 NOTE — Consult Note (Signed)
NAME:  [name], [name]              ACCOUNT NO.:  [id number]   MEDICAL RECORD NO.:  [id number]          PATIENT TYPE:  INP   LOCATION:  2115                         FACILITY:  [facility]   PHYSICIAN:  Marca Ancona, MD      DATE OF BIRTH:  September 10, 1928   DATE OF CONSULTATION:  06/15/2008  DATE OF DISCHARGE:                                 CONSULTATION   PRIMARY CARDIOLOGIST:  Verne Carrow, MD.   PRIMARY CARE Mairyn Lenahan:  [name]. Norins, MD.   PATIENT PROFILE:  A 77 year old African American female with history of  single-vessel CAD status post stenting of the LAD who presented to the  [facility] ED on June 13, 2008 in the setting of diabetic ketoacidosis  with seizures, acute renal failure, hyperkalemia, and appears to be  noted to have elevated cardiac markers.   PROBLEM LIST:  1. CAD/non-ST elevation MI.      a.     On March 22, 2005, PCI and stenting of LAD with placement of       3.0 x 20 mm TAXUS drug-eluting stent.  She, otherwise, has       nonobstructive disease.  The diagonal which was jailed by the       stent with balloon angioplasty.      b.     On April 25, 2008, adenosine Myoview, normal EF and no       ischemia.  2. Left renal artery stenosis 50% by catheterization on November 11, 2003.  3. Hypertension.  4. Hyperlipidemia.  5. Type 2 diabetes mellitus.  6. Anemia.  7. GERD.  8. Osteoarthritis.  9. Depression.  10.Glaucoma.  11.Chronic kidney disease with baseline creatinine of approximately      1.9.  12.Status post cholecystectomy in 2004.  13.Gout.  14.Seizures during this admission.  15.Left lower lobe pneumonia in this admission.   HISTORY OF PRESENT ILLNESS:  A 77 year old [place] American female with  a history of CAD status post drug-eluting stent to the LAD in June 2006  and PTCA of the jailed diagonal branch.  She recently had a negative  Myoview in July 2009, which was performed secondary to history of chest  pain approximately 2-3 weeks  prior with chronic fatigue.  The patient is  currently intubated; however, her family reports that approximately 3  days prior to admission she began to experience significant diarrhea and  therefore decreased her p.o. intake.  When she was not eating, she  stopped taking her insulin.  She went to bed on Saturday night at about  7 o'clock and she was noted to be sleeping by her son at 7 a.m. the next  morning.  By 10 a.m., she still had not gotten up, so he tried to arouse  her, and she was unarousable.  She was taken to the [facility] ED where  she was noted to have seizures and subsequently intubated.  Lab work  showed blood glucose greater than 700, elevated creatinine,  hyperkalemia, and leukocytosis in the setting of a fever of 103.  The  patient was  admitted to Critical Care Medicine and received vancomycin  and Zosyn.  She has also been treated with insulin drip and has  undergone electrolyte correction.  Chest x-ray from June 14, 2008,  suggested left lower lobe pneumonia and she has been on the above  antibiotics .  Also, blood cultures are normal x1 showing Gram-positive  cocci in clusters.   We were asked to see the patient today secondary to elevated cardiac  markers, peaking on the day of admission with a CK of 58, MB of 10.7,  and troponin I of 1.14.  The family reports that the patient did not  have any complaints of chest discomfort or dyspnea prior to day back to  the weekend.   ALLERGIES:  No known drug allergies.   CURRENT MEDICATIONS:  1. Heparin 5000 mg t.i.d.  2. Protonix 40 mg IV daily.  3. Dilantin 1000 mg IV q.6 h.  4. Zosyn 2.25 mg IV q.8 h.  5. Vancomycin 1 g daily.  6. Insulin drip.   FAMILY HISTORY:  Mother died in her 34s and father died in his 24s.  She  has a brother and a sister with history of CVA.  The daughter is not  really clear how their grandparents died.   SOCIAL HISTORY:  She lives in [name] with her son.  She is retired.  There  is no tobacco, alcohol, or drug use.  She does not routinely  exercise.   REVIEW OF SYSTEMS:  Per patient's family, she has diarrhea with  decreased p.o. intake prior to admission.  There is apparently no  history of chest pain.  Otherwise, all systems are reviewed with the  family and reported negative.   PHYSICAL EXAMINATION:  VITAL SIGNS:  Temperature 99 this morning and  then 99.7, heart rate 65-80, respirations 16, blood pressure 131/55 up  to 170/56, CVP of 9, and pulse oxygen 100% with an FiO2 of 30%.  GENERAL:  Pleasant African American female in no acute distress.  She is  intubated and follows commands.  NEUROLOGIC:  Grossly intact.  SKIN:  Warm and dry without lesions or masses.  HEENT:  Normal.  NECK:  No bruits or JVD.  LUNGS:  Clear to auscultation and unlabored with crackles at the bases  left greater than right.  Scattered rhonchi.  CARDIAC:  Regular S1 and S2.  No S3, S4 murmurs.  ABDOMEN:  Soft, nontender, and nondistended.  Bowel sounds present.  LOWER EXTREMITIES:  No psoriasis, clubbing, cyanosis, or edema.  Dorsalis pedis, posterior tibial pulse is 2+ bilaterally.   Head CT from June 13, 2008, showed no acute abnormal findings.  Chest  x-ray from June 14, 2008 shows  slight diffuse interstitial edema.  Abdominal x-ray showed no acute findings.  EEG is pending.  EKG is sinus  rhythm, rate at 74, normal axis, small inferior Q, and no acute ST-T  changes.   LABORATORY WORK:  Hemoglobin 10.5, hematocrit 32.6, WBC 12.7, and  platelets 116.  Sodium 148, potassium 3.9, chloride 113, CO2 of 17, BUN  44, creatinine 2.23, and glucose 132.  Total bilirubin 2.0, alkaline  phosphatase 146, AST 44, ALT 46, total protein 7.3, and albumin 2.1.  CK  49, MB 8.0, and troponin I 0.34.  Iron 42, TIBC 208, B12 796, folate  5.4, and ferritin is 97.  Urinalysis is negative.  Urine culture is  negative.  Blood culture showed Gram-positive cocci in clusters x1-2.    IMPRESSION:  1. Non-ST elevation  myocardial infarction/coronary artery disease.      The patient presents with elevated cardiac markers in the setting      of diabetic ketoacidosis and renal failure as well as BDIS seizure      activity.  Cardiac markers are trended down during this admission.      Her family noticed her chest pain prior to admission.  She had a      repeat negative Myoview in July 2009.  Her EKG is unremarkable.  In      setting of her other illnesses along with recent nonischemic      Myoview, I would plan medical therapy at this point.  We plan to      add back her aspirin, beta-blockers, and statins.  If she has chest      pain, we will have a low threshold for catheterization at some      point while she is an inpatient or as an outpatient.  2. Hypertension.  Blood pressure has been elevated at times.  She has      been on labetalol as well as Norvasc in the form of Caduet at home.      We have got her back on a beta-blocker which will likely require      titration and then conversion to labetalol.  As we do not want her      pressure to get too high, we will initiate IV nitroglycerin to      maintain systolic less than 140.  3. Diabetic ketoacidosis/diabetes mellitus.  4. Acute-on-chronic kidney disease.  Creatinine improving.  Continue      to hold spironolactone.  5. Hyperlipidemia, continue on statin.  6. Anemia, stable.  7. Gastroesophageal reflux disease.  She is on PPI.  8. Left lower lobe pneumonia, on antibiotics per pulmonary.      Nicolasa Ducking, ANP      Marca Ancona, MD  Electronically Signed    CB/MEDQ  D:  06/15/2008  T:  06/16/2008  Job:  [id number]

## 2011-02-26 NOTE — Discharge Summary (Signed)
NAME:  [name], [name]              ACCOUNT NO.:  [id number]   MEDICAL RECORD NO.:  [id number]          PATIENT TYPE:  INP   LOCATION:  2027                         FACILITY:  [facility]   PHYSICIAN:  [name]. Norins, MD  DATE OF BIRTH:  [birth year]   DATE OF ADMISSION:  06/13/2008  DATE OF DISCHARGE:  06/24/2008                               DISCHARGE SUMMARY   DIAGNOSES AT THE TIME OF DISCHARGE:  1. Diabetes type 2, initially diabetic ketoacidosis on admission.  2. Acute respiratory failure on admission in the setting of diabetic      ketoacidosis, resolved.  3. Seizure, felt secondary to diabetic ketoacidosis.  Dilantin was      increased during this admission.  Will need followup at the skilled      nursing facility.  4. Acute-on-chronic renal insufficiency with baseline creatinine of      1.9, currently at baseline.  5. Hypertension.  6. Dyslipidemia.  7. Normocytic anemia with likely component of chronic disease in the      setting of chronic renal insufficiency.  8. Hypokalemia, resolved status post repletion.  9. Chronic diastolic heart failure, currently euvolemic.  10.History of coronary artery disease, stable on medication      management.  11.Gastroesophageal reflux disease.  No complaints.  Stable at this      time.  12.Osteoarthritis, currently at baseline.  13.History of depression.  Appears stable.  14.History of gout.  15.Metabolic acidosis on admission, felt secondary to diabetic      ketoacidosis and acute respiratory failure, currently resolved.  16.History of secondary hyperparathyroid.   HISTORY OF PRESENT ILLNESS:  [name] is a 77 year old female who was  admitted on June 13, 2008, after being found down at home and  presented with a seizure-type activity and was subsequently intubated.  She was found to be in DKA with renal insufficiency and hypokalemia.  She was admitted to the critical care service.   COURSE OF HOSPITALIZATION:  1. DKA/acute  respiratory failure.  The patient was admitted.  She was      intubated.  She was placed on DKA protocol with a Glucommander      drip.  She also was then transitioned to Lantus with sliding scale      coverage.  She did have some episodes of hypoglycemia during this      admission.  Her Lantus was decreased from 45 units to 35 units      yesterday evening.  She again had some low blood sugars, as low as      47.  We will decrease her Lantus to 30 units subcu q.h.s., and she      will need close monitoring at the  facility and adjustment of her      Lantus as needed.  2. Hypokalemia.  She did have hypokalemia during this admission and      this has been replenished.  She will need followup BMET at the      facility.  Today's potassium is 3.9.  3. History of seizure disorder.  The patient has had no further  seizures this admission.  Her Dilantin was noted to be      subtherapeutic and her dose was increased.  She will need a      followup Dilantin level at the facility.   PHYSICAL EXAMINATION:  VITAL SIGNS:  Blood pressure 132/68, heart rate  64, respiratory rate 18, temperature 99.3, O2 sat 99% on room air.  GENERAL:  The patient is an obese elderly African American female who is  awake, alert, and in no acute distress.  HEENT:  Eyes:  She is noted to have opacification of the right iris and  lens.  Head normocephalic, atraumatic.  CARDIOVASCULAR:  S1 and S2.  Regular rate and rhythm is noted. There is  2+ bilateral lower extremity edema.  RESPIRATORY:  She has breath sounds clear to auscultation bilaterally  without wheezes, rales or rhonchi.  ABDOMEN:  Soft, nontender, nondistended.  Positive bowel sounds are  noted.  NEUROLOGIC:  She is moving all extremities.  Speech is clear.  PSYCHIATRIC:  She is calm and pleasant.  She answers questions  appropriately.   PERTINENT LABORATORIES AT THE TIME OF DISCHARGE:  BUN 12, creatinine  1.76, sodium 138.   DISPOSITION:  She will be  discharged to the skilled nursing facility.   FOLLOWUP:  Follow up on discharge and as needed.  She will need followup  with her primary care Maxim Bedel, Dr. Illene Regulus.   MEDICATIONS AT THE TIME OF DISCHARGE:  1. Heparin 5000 units subcu t.i.d. for DVT prophylaxis.  2. Norvasc 10 mg p.o. daily.  3. Protonix 40 mg p.o. daily.  4. NovoLog sliding scale coverage t.i.d. a.c. meals.  5. Lumigan 0.03% eye drops 1 drop to both eyes daily.  6. Alphagan 0.15% drops to both eyes daily.  7. Timolol 1 drop to both eyes daily.  8. Pilocarpine 1 drop to both eyes daily.  9. Hydralazine 100 mg p.o. q.8 h.  10.Lasix 80 mg p.o. daily.  11.Lantus insulin 30 units subcu q.h.s.  12.Potassium 20 mEq p.o. daily.  13.Dilantin 150 mg p.o. q.8 h.  14.Crestor 40 mg p.o. q.h.s.  15.Aspirin 81 mg p.o. daily.  16.Metoprolol 75 mg p.o. b.i.d.  17.Spironolactone 25 mg p.o. daily.  18.Colchicine 0.6 mg p.o. daily.  19.Allopurinol 100 mg p.o. daily.  20.Tylenol 650 mg p.o. q.6 h p.r.n.   DISPOSITION:  She will be discharged to a skilled nursing facility.   FOLLOWUP:  She will need a followup Dilantin level drawn as well as  followup BMET at the facility and close monitoring of her blood sugars.   Greater than 30 minutes were spent on discharge planning.      Sandford Craze, NP      [name]. Norins, MD  Electronically Signed    MO/MEDQ  D:  06/24/2008  T:  06/24/2008  Job:  [id number]

## 2011-02-26 NOTE — Assessment & Plan Note (Signed)
[facility]                            CARDIOLOGY OFFICE NOTE   NAME:[name], [name]                     MRN:          [id number]  DATE:04/12/2008                            DOB:          May 21, 1928    PRIMARY CARE PHYSICIAN:  [name]. Norins, MD   REASON FOR VISIT:  Coronary artery disease and episode of chest pain.   HISTORY OF PRESENT ILLNESS:  This is my first meeting with [name].  [name] was previously followed by Dr. Samule Ohm and was last seen in the  office back in September 2007.  [name] has a history of coronary artery  disease having presented with unstable angina in June 2006 and underwent  placement at that time of a drug-eluting stent in the proximal left  anterior descending.  [name] was also treated for hypertension, and renal  artery stenosis was ruled out.  I notice that [name] had undergone a  previous Myoview in 2005 that demonstrated anterior ischemia.  [name] has  not had any subsequent ischemic evaluation.   I reviewed the patient's medications today.  [name] is on low-dose aspirin  only.  Dr. Melinda Crutch last note mentions indefinite use of Plavix, and the  patient does not recall any intolerances while taking the medication.  As far as symptoms, [name] states that approximately 2-3 weeks ago,  [name] experienced an episode of chest pain while doing some housework.  [name] states that this resolved in approximately 5 minutes and did not  require any nitroglycerin.  [name] felt a burning sensation in [name] chest.  [name] does feel fatigued, but has not had any recurrent chest pain since  then.  I see a phone message noted in the chart from March 23, 2008,  apparently when the patient called in.  It looks as if [name] was  instructed to call 911, although [name] did not want to do this, and this  office visit was subsequently scheduled.  [name] is comfortable today  without any active chest pain, and [name] electrocardiogram shows sinus  rhythm with occasional  premature atrial complexes and a rightward axis.  Voltage in general is actually better compared to [name] last tracing in  2007.  [name] is not reporting any breathlessness at rest and has had no  cough or hemoptysis.   ALLERGIES:  No known drug allergies.   PRESENT MEDICATIONS:  1. Allopurinol 100 mg p.o. daily.  2. Colchicine 0.6 mg p.o. daily.  3. Spironolactone 25 mg p.o. daily.  4. Tylenol as directed.  5. Gabapentin 600 mg p.o. t.i.d.  6. Zemplar 1 mcg daily.  7. Diazepam 5 mg p.o. daily.  8. Amlodipine 5 mg p.o. daily.  9. Labetalol 300 mg p.o. b.i.d.  10.Lasix 8 mg p.o. daily.  11.Enteric-coated aspirin 81 mg p.o. daily.  12.Nitroglycerin 0.4 mg sublingual p.r.n.   REVIEW OF SYSTEMS:  As described in the present illness.  No  palpitations or syncope.  [name] has chronic lower extremity edema and  venous stasis.  No orthopnea or PND.  No fevers or chills.  Otherwise  negative.   PHYSICAL EXAMINATION:  VITAL SIGNS:  Blood pressure is 183/81, heart  rate is 60, and weight is 209 pounds.  GENERAL:  This is an overweight woman, seated, in no acute distress,  without active chest pain or breathlessness.  HEENT:  Conjunctivae are injected.  Oropharynx clear.  NECK:  Supple.  No elevated jugular venous pressure.  No audible bruits.  LUNGS:  Generally clear with diminished breath sounds.  No wheezing or  labored breathing.  CARDIAC:  Regular rate and rhythm.  Somewhat distant heart sounds.  No  S3, gallop, or pathologic murmur.  ABDOMEN:  Obese.  I cannot palpate liver edge.  Bowel sounds are  present.  EXTREMITIES:  Venous stasis and 1-2+ pitting edema, fairly chronic  appearing, and firm.   IMPRESSION AND RECOMMENDATIONS:  Episode of chest pain approximately 2-3  weeks ago, on a baseline of general fatigue, but no exertional chest  pain in a 77 year old woman with a known history of cardiovascular  disease status post presentation with unstable angina and subsequent   drug-eluting stent placement to the proximal left anterior descending in  June 2006.  [name] has not had followup ischemic testing since that time  and last saw Dr. Samule Ohm in 2007.  Today's electrocardiogram is fairly  nonspecific.  In reviewing Dr. Melinda Crutch previous recommendations, he had  suggested indefinite aspirin and Plavix as of 2007.  In reviewing [name]  chart, I see that [name] was significantly anemic with a hemoglobin of 8.3  in June 2008 and had a BUN and creatinine of 50 and 2.2 at that time.  There is a colonoscopy reported in the chart also from 2005,  demonstrating diverticulosis and internal hemorrhoids.  It is not clear  to me whether the patient's Plavix was stopped due to active bleeding or  not.  The patient does not recall, therefore I will not reinstitute it  at this point.  For further assessment of [name] cardiac status, I have  recommended an adenosine Myoview on medical therapy, and an  echocardiogram.  Based on this, further recommendations can be made.  We  will plan to bring [name] back to the clinic over the next month with Dr.  Clifton James to establish regular cardiology followup going forward given my  transition to the [facility].  In addition, we will also  check a CBC and BMET today in followup of the patient's previously  documented anemia and renal insufficiency.  This may also help guide  futher managament decisions.     Jonelle Sidle, MD  Electronically Signed    SGM/MedQ  DD: 04/12/2008  DT: 04/13/2008  Job #: [id number]   cc:   [name]. Norins, MD

## 2011-02-26 NOTE — H&P (Signed)
NAME:  [name], [name]              ACCOUNT NO.:  [id number]   MEDICAL RECORD NO.:  [id number]          PATIENT TYPE:  INP   LOCATION:  2115                         FACILITY:  [facility]   PHYSICIAN:  Charlcie Cradle. Delford Field, MD, FCCPDATE OF BIRTH:  July 22, 1928   DATE OF ADMISSION:  06/13/2008  DATE OF DISCHARGE:                              HISTORY & PHYSICAL   CHIEF COMPLAINT:  Metabolic acidosis, DKA, and seizure.   HISTORY OF PRESENT ILLNESS:  This is a 77 year old African American  female has previous known history of hypertension, coronary artery  disease, diabetes.  She presented to the emergency room today after  being found out down at home, presented with seizure-type activity and  subsequently required intubation, found to be in DKA with renal  insufficiency and hyperkalemia.  This patient was thus intubated, placed  into the ICU service, and we were asked to admit this patient.   PAST MEDICAL HISTORY:  1. Medical history of known coronary artery disease.  2. Diabetes.  3. Hypertension.  4. Gout.  It appears last hospitalized, February 2007.  5. Hyperlipidemia as well.  6. History of reflux disease.  7. Arthritis.  8. Depression.  9. Glaucoma.  10.Anemia.  11.Previous cholecystectomy in 2004.   MEDICATION ALLERGIES:  None.   MEDICATIONS:  Prior to admission include,  1. Neurontin 600 mg t.i.d.  2. Labetalol 300 mg b.i.d.  3. Colchicine 0.6 mg every 2 hours.  4. Caduet 10/80 daily.  5. Allopurinol 100 mg daily.  6. Valium 5 mg daily.  7. Aldactone 25 mg daily.  8. Zemplar 1 mcg daily.  9. Lasix 80 mg daily.  10.Tylenol.  11.Aspirin.  Also in the medication record is listed in the past on      ACE inhibitor, but she does not appear to be on the ACE inhibitor      at this time.   FAMILY HISTORY:  Otherwise noncontributory.   REVIEW OF SYSTEMS:  Otherwise noncontributory.   PHYSICAL EXAM:  GENERAL:  This is an ill-appearing [place] American  female, orally intubated  on full vent support.  CHEST:  Distant breath sounds, poor air flow.  CARDIAC:  Resting tachycardia without S3, normal S1 and S2.  ABDOMEN:  Soft.  Bowel sounds hypoactive.  EXTREMITIES:  Intermittently is nonfocal.  VITAL SIGNS:  Saturation 100%, pulse 95, blood pressure 145/93, again is  orally intubated.  Vent settings rate of 16, volume at 500, 100% FIO2,  PEEP of 5, PRVC mode.   Chest x-ray showed lower lobe atelectasis, low lung volumes,  endotracheal tube in proper position.   LABORATORY DATA:  Sodium 122, potassium 6.5, chloride 104, BUN 65,  creatinine 2.9, and blood sugars are greater than 700, and ionized  calcium 1.18.  Hemoglobin 15.  PTT 35, INR 1.4, pH 7.28, pCO2 22, and  pO2 517.   IMPRESSION:  Impression is that of status epilepticus, acute respiratory  failure, renal insufficiency, hyperkalemia, diabetic ketoacidosis, fever  with underlying probable sepsis, hyponatremia, and metabolic acidosis.   RECOMMENDATIONS:  Check full sepsis workup.  Gave Valium, gave DKA  protocol, gave the  Zosyn and vancomycin.  Urine cultures have been  obtain.  Blood cultures have been obtained.  We will obtain respiratory  aspirate and tracheal aspirate.  CT of head noted already been negative  and unremarkable.  The patient will be admitted to the Intensive Care  Unit.      Charlcie Cradle Delford Field, MD, [facility]  Electronically Signed     PEW/MEDQ  D:  06/13/2008  T:  06/14/2008  Job:  [id number]   cc:   Georgena Spurling, M.D.

## 2011-03-01 NOTE — Discharge Summary (Signed)
NAME:  Degrazia, Carter              ACCOUNT NO.:  [id number]   MEDICAL RECORD NO.:  [id number]          PATIENT TYPE:  INP   LOCATION:  3735                         FACILITY:  [facility]   PHYSICIAN:  Rene Paci, M.D. LHCDATE OF BIRTH:  13-Jun-1928   DATE OF ADMISSION:  09/17/2004  DATE OF DISCHARGE:  09/20/2004                                 DISCHARGE SUMMARY   DISCHARGE DIAGNOSES:  1.  Left shoulder pain.  2.  Atypical chest pain.  3.  Heme-positive stool.  4.  Anemia.   BRIEF ADMISSION HISTORY:  Ms. Nordling is a 77 year old African-American  female with known coronary disease.  The patient presented to the office  with five to six-day complaints of shoulder pain.  She pointed to her left  upper chest.  This pain radiated to her neck and arm.  It was accompanied by  shortness of breath.   PAST MEDICAL HISTORY:  1.  Coronary artery disease status post catheterization in January of 2005      revealing 60-70% LAD disease that was going to be treated medically.      The patient also had 50% left renal artery stenosis.  2.  Adult-onset diabetes mellitus insulin-requiring.  3.  Gastroesophageal reflux disease.  4.  Hypercholesterolemia.  5.  Arthritis.  6.  Hypertension.  7.  Depression.  8.  History of exploratory laparotomy.  9.  Right corneal implant which failed.  10. Status post cholecystectomy 2004.   HOSPITAL COURSE:  #1 - CARDIOVASCULAR:  The patient presented with known  coronary disease and chest pain.  The patient's serial cardiac enzymes were  elevated, but not indicative of any cardiac ischemia.  However, the patient  has multiple cardiac risk factors including hypertension,  hypercholesterolemia, and diabetes.  The patient also has known coronary  disease that is being treated medically.  It was therefore felt that  cardiology should see the patient.  They did see the patient and recommended  outpatient Cardiolite.  This was to be scheduled by cardiology.   #2  - GASTROINTESTINAL:  The patient had some mild anemia and heme-positive  stool.  Cardiology requested a GI evaluation.  The patient was seen in  consultation by Dr. Marina Goodell.  She underwent a colonoscopy on September 20, 2004  revealing diverticulosis and internal hemorrhoids.  She also had an  endoscopy revealing erosive antral gastritis.  It was recommended she  continue her proton-pump inhibitor.  There was no other pathology and GI  felt that no further GI work-up was required.  They noted that if she turned  out to be H. pylori positive they would recommend treatment.  However, this  is still pending.   #3 - ADULT-ONSET DIABETES MELLITUS:  Stable.   #4 - HYPERCHOLESTEROLEMIA:  The patient's cholesterol was elevated at 209.  The patient noted that she had not been taking her Lipitor for several  weeks.  It appears that cost may be an issue for the patient.   #5 - HYPERTENSION:  Blood pressure was also stable.   DISCHARGE LABORATORIES:  Hemoglobin 10.1, hematocrit 30.1.  CMET was normal  except for an alkaline phosphatase of 153.  Cholesterol 207, triglycerides  192, HDL 35, LDL 134, iron 96, TIBC high at 503, percent saturation mildly  low at 19.  B12, folate, and ferritin were all normal.   DISCHARGE MEDICATIONS:  1.  Prilosec 20 mg daily.  May use over-the-counter Prilosec.  2.  Metformin 500 mg t.i.d.  3.  Labetalol 400 mg b.i.d.  4.  HCTZ 12.5 mg daily.  5.  Quinapril 40 mg daily.  6.  Caduet 10/80 mg daily.  This is new.  7.  Humalog insulin 12 units in the morning, 5 units at lunch, and 10 units      at bedtime.   FOLLOWUP:  Follow up with Dr. Debby Bud in one to two weeks.  Follow up [facility] December 22 at 12 noon.      Laur   LC/MEDQ  D:  09/20/2004  T:  09/21/2004  Job:  [id number]   cc:   Wilmar Bing, M.D.

## 2011-03-01 NOTE — Assessment & Plan Note (Signed)
[facility]                           PRIMARY CARE OFFICE NOTE   NAME:[name], [name]                     MRN:          [id number]  DATE:01/14/2007                            DOB:          1928-01-21    PRIMARY CARE OFFICE NOTE   [name] is a 77 year old African-American woman followed for  multiple medical problems, including hypertension, insulin dependent  diabetes, known glaucoma, headache of a tension type, known degenerative  joint disease.  Her physician roster includes Dr. Lucianne Muss for management  of her diabetes, Dr. Oneita Jolly for gynecology.  The patient was last seen  in the office October 01, 2006 and at that time was doing relatively  well with Neurontin for her spinal stenosis.   The patient reports that she has been having some increasing pain in her  lower back.  It is better when lying down, worse when standing.  She has  had no radiation, no muscle spasm.  She was taking Aleve in addition to  her other medications.   The patient reports that she has had intermittent problems with  abdominal pain, usually in the lower abdominal quadrant, left greater  than right.  It has not hurt her for several weeks.   The patient complains of significant insomnia with sleep latency  problems.   The patient's last hemoglobin A1c on the chart, 7.2% from March of 2007.  This is managed and followed by Dr. Lucianne Muss.   Foot pain, the patient reports she is having pain at the dorsum of her  right foot and there is a small raised area there as well.  She also  complains of having gout-like pain in her great toe where the toe will  get red and hot, and very painful.   CURRENT MEDICATIONS:  1. Metformin 1000 mg b.i.d.  2. Etodolac 500 mg b.i.d.  3. Iron 324 mg t.i.d.  4. Butalbital b.i.d.  5. Lasix 120 mg daily.  6. Lisinopril 40 mg daily.  7. Caduet 10/80 once daily.  8. Humalog as directed.  9. Labetalol 300 mg b.i.d.  10.Gabapentin 600 mg  t.i.d.  11.Aspirin 325 mg daily.  12.Valium 5 mg p.r.n.  13.Hydrocodone 7.5/750 t.i.d. p.r.n.  14.Colchicine on a p.r.n. basis for gout.   REVIEW OF SYSTEMS:  Negative for any fevers, sweats, or chills.  No ENT,  cardiovascular, or respiratory complaints.   LIMITED EXAM:  Temperature was 97.7, blood pressure 156/69, pulse was  49, weight is 215.  GENERAL APPEARANCE:  This is an overweight African-American woman in no  acute distress.  CHEST:  The patient is moving air well with no rales, wheezes, or  rhonchi.  CARDIOVASCULAR:  2+ radial pulses.  Her precordium is quiet.  She had a  regular rate and rhythm.  ABDOMEN:  Obese, soft.  She had positive bowel sounds.  There was no  tenderness to deep palpation.  EXTREMITIES:  The patient has what appears to be a small cyst that is  approximately 2x2.5 cm over the fifth metatarsal of the right foot.  This is slightly flocculent.  The first MTP joint  of the right foot is  without erythema, without heat.  It is nontender at today's visit.   ASSESSMENT AND PLAN:  1. Back pain.  The patient has known spinal stenosis, as well as      degenerative joint disease.  She is already taking both an      antiinflammatory drug as well as the gabapentin.  Would not change      her medications at this time.  I have advised the patient not to      take additional antiinflammatory drugs, specifically, not to take      Aleve, Motrin, or other drugs of that nature.  2. Abdominal pain.  The patient's examination is unrevealing.  No      further evaluation at this time.  3. Insomnia.  The patient with a probable sleep disorder.  Will have      her start taking temazepam 15 mg p.o. nightly for sleep.  4. Diabetes.  The patient is followed by Dr. Lucianne Muss.  Will not order      tests at this time.  5. Blood pressure.  The patient is on a complex regimen with at least      4 agents.  At this time, we will have her continue with these      drugs, although her  blood pressure is slightly elevated at 156.      Will have her check her blood pressure on her own outside the      office to see if her blood pressure is better controlled and      avoiding white coat hypertension.  6. Gout.  The patient does take colchicine p.r.n.  At this point, we      will have her start taking colchicine on a daily basis for      prophylaxis so that she does not have flares of gout.  She does      have a supply on hand at this time.  7. The patient has what appears to be a ganglion cyst on the dorsum of      her right foot.  It is not painful, not interfering with her walk      wearing her shoes.  At this point will not interfere, specifically      avoiding aspiration.   SUMMARY:  The patient with multiple medical problems as outlined above.  Her medications are complicated, but should be giving her adequate  relief.  Again, she is advised not to take additional antiinflammatory  drugs.     [name] Norins, MD  Electronically Signed   MEN/MedQ  DD: 01/14/2007  DT: 01/14/2007  Job #: [id number]   cc:   [name], M.D.

## 2011-03-01 NOTE — Progress Notes (Signed)
[facility]                          PERIPHERAL VASCULAR OFFICE NOTE   NAME:Lovern, BRENDI MCCARROLL                     MRN:          [id number]  DATE:06/19/2006                            DOB:          04-09-1928    HISTORY OF PRESENT ILLNESS:  Ms. Wurth is a 77 year old lady who presented  with unstable angina in June, 2006.  I placed a TAXUS drug-eluting stent in  the left anterior descending artery.  Blood pressure was markedly elevated  at that time and she has had some mild renal insufficiency.  I therefore  performed renal angiography which demonstrated no significant renal artery  stenosis.   Since then, she has done very well from a cardiovascular view point.  She  has had no recurrent chest discomfort.  She denies any exertional dyspnea,  PND, orthopnea, edema, palpitations, syncope and presyncope.   Unfortunately, she continues to be troubled by substantial right leg  sciatica.  In addition, she has had severe discomfort in her left shoulder  for the past 3 days.  It has been constant for these 3 days and is unlike  the discomfort which was her unstable angina.  The pain is worse if she lays  on her left shoulder and markedly worse if she attempts to abduct her arm.  She recalls no trauma.  She has had no jaw claudication or headache.   CURRENT MEDICATIONS:  1. Metformin 1000 mg twice per day.  2. Etodolac 500 mg twice per day.  3. Iron 324 mg three times per day.  4. Butalbital twice per day.  5. Lasix 40 mg per day.  6. Lisinopril 40 mg per day.  7. Caduet 10/80 one per day.  8. Plavix 75 mg per day.  9. Humalog insulin 12 units in the morning, 10 units at lunch and 12 units      in the evening.   PHYSICAL EXAMINATION:  She is generally well appearing, in no distress with  a heart rate 67, blood pressure 120/72 and a weight of 241 pounds.  She has  no jugular venous distention and no thyromegaly.  HEENT:  Normal.  Specifically,  there is no temporal arterial tenderness or  erythema.  LUNGS:  Clear to auscultation.  Respiratory effort is normal.  She has a  nondisplaced point of maximal cardiac impulse.  There is a regular rate and  rhythm without murmur, rub, or gallop.  ABDOMEN:  Obese, soft, nondistended, nontender.  Unable to assess  hepatosplenomegaly due to habitus.  No evident midline pulsatile mass,  though exam is limited due to habitus.  Bowel sounds are normal and there is  no abdominal bruit.  EXTREMITIES:  Warm without clubbing, cyanosis, edema, or ulceration.  Bilateral radial pulses are 2+ bilaterally.  Carotid pulses are 2+  bilaterally without bruit.  DP pulses are 2+ bilaterally.   ELECTROCARDIOGRAM:  Demonstrates normal sinus rhythm with minor nonspecific  ST-T abnormalities.   IMPRESSION RECOMMENDATIONS:  1. Coronary disease:  No prior myocardial infarction and preserved      ejection fraction.  Drug eluting stent in the left anterior descending.  Continue aspirin and Plavix indefinitely.  2. Hypertension:  Nicely controlled.  Continue current medications.  3. Hypercholesterolemia:  Managed by Dr. Debby Bud.  The LDL was close to      goal at check in March.  4. Shoulder pain:  Appears to be musculoskeletal.  Will check erythrocyte      sedimentation rate to exclude polymyalgia rheumatica.  I have arranged      followup with Dr. Debby Bud shortly.                                   Salvadore Farber, MD   WED/MedQ  DD:  06/19/2006  DT:  06/19/2006  Job #:  [id number]   cc:   [name], M.D.  [name] Norins, MD

## 2011-03-01 NOTE — Consult Note (Signed)
NAME:  [name], [name]              ACCOUNT NO.:  [id number]   MEDICAL RECORD NO.:  [id number]          PATIENT TYPE:  INP   LOCATION:  1827                         FACILITY:  [facility]   PHYSICIAN:  [name], M.D.       DATE OF BIRTH:  1928/06/07   DATE OF CONSULTATION:  11/26/2005  DATE OF DISCHARGE:                                   CONSULTATION   CHIEF COMPLAINT:  Low blood sugar.   HISTORY:  This is a 77 year old, African-American lady who is being admitted  by the cardiology service for chest pain.  The patient says she has been  having this chest pain off and on, but this morning she was starting with  chest and intrascapular pain.  The patient apparently arrived at 2:10 a.m.  She thinks her blood sugar was 128 last night, but when her labs were drawn  at 5:44 a.m. her glucose was 25.  She was given dextrose intravenously with  D50 bolus and followed by D5 IV, with which her blood sugars have been just  over 100 as of 11:00 this morning.  The patient says she did not take her  Lantus insulin this morning or any NovoLog.   This patient's diabetes dates back to 50.  She currently has been on  insulin for at least 10 years and seems to have had better control with that  and metformin. She had marked fluctuation in her blood sugars with NPH and  Humalog previously and has been taking Lantus and Humalog for 3-4 months at  least.  Her fasting blood sugars at home are generally near normal, but at  other times they do fluctuate and maybe occasionally are as high as 350.  The patient does not have her recent blood sugar records available at this  time.  She states she does not have excessive hypoglycemia.  She does,  however, understand her instructions somewhat poorly, although she checks  her blood sugar quite regularly.   The patient has had difficulty losing weight.  She weighs about 210 pounds.  She does not exercise much and has a variable diet.  Recently, she states  she has  not had much appetite.   CURRENT MEDICATIONS:  Metformin; Lantus; Humalog; Lipitor; lisinopril;  Norvasc; Lasix; labetalol; aspirin; Plavix.   ALLERGIES:  No known allergies.   FAMILY HISTORY:  Positive for CVA and diabetes.   PERSONAL HISTORY:  She lives by herself.  She is a nonsmoker.   REVIEW OF SYSTEMS:  She has had right eye blindness and problems with  hypercholesterolemia, hypertension, anemia which is partly related to iron  deficiency, and nonspecific joint pains, as well as depression.   PHYSICAL EXAMINATION:  GENERAL:  The patient is alert and oriented.  She is  currently not in acute distress.  She does not look unusually pale.  VITAL SIGNS:  The last blood pressure was 125/60; pulse is 56; respirations  normal; temperature normal.  HEENT:  Mucous membranes are moist.  HEART:  Sounds normal.  LUNGS:  Clear.  ABDOMEN:  Nontender.  EXTREMITIES:  No edema.  ASSESSMENT:  Since this patient has had hypoglycemia last night, her Lantus  dose will have to be reduced, and also because of her decreased appetite her  NovoLog will also be decreased and adjusted as needed while she is in the  hospital.   Thank you for the consultation.           ______________________________  [name], M.D.     AK/MEDQ  D:  11/26/2005  T:  11/27/2005  Job:  [id number]

## 2011-03-01 NOTE — Discharge Summary (Signed)
NAME:  Lamartina, Yeng              ACCOUNT NO.:  [id number]   MEDICAL RECORD NO.:  [id number]          PATIENT TYPE:  INP   LOCATION:  4709                         FACILITY:  [facility]   PHYSICIAN:  Salvadore Farber, M.D. LHCDATE OF BIRTH:  August 03, 1928   DATE OF ADMISSION:  11/26/2005  DATE OF DISCHARGE:  11/28/2005                                 DISCHARGE SUMMARY   PRIMARY CARDIOLOGIST:  Dr. Randa Evens.   PRINCIPAL DIAGNOSES:  1.  Musculoskeletal chest pain.  2.  Insulin-requiring diabetes mellitus.      1.  Marked hypoglycemia on admission.  3.  Renal insufficiency.  4.  Iron deficiency anemia.  5.  Small left pleural effusion.   SECONDARY DIAGNOSES:  1.  Coronary artery disease.      1.  Status post drug eluting stenting left anterior descending June          2006.  2.  Hyperlipidemia.  3.  Hypertension.   PROCEDURES:  None.   REASON FOR ADMISSION:  Ms. Vanrossum is a 77 year old female, with known  coronary artery disease, who presented to Dr. Dietrich Pates in the emergency room  with complaint of mid scapular and shoulder pain.  Her symptoms were felt to  be atypical for active ischemia and she was started on Naprosyn and admitted  for rule out of myocardial infarction.  Of note, she was markedly  hypoglycemic (25) on admission requiring treatment with IV glucose.   HOSPITAL COURSE:  The patient ruled out for myocardial infarction with all  serial cardiac markers within normal limits.   The patient reported a clinical response with Naprosyn for treatment of  musculoskeletal pain.   The patient was referred to her endocrinologist, Dr. Lucianne Muss, for assistance  and management of her diabetes.  Hemoglobin A1c was 6.2 and followup glucose  was 107 by time of discharge.   Dr. Lucianne Muss adjusted the diabetic regimen and put her on Lantus 40 q.h.s.   The patient was noted to have anemia during this brief stay.  Iron profile  notable for decreased iron level 27 with decreased  percent sat 9.  The  patient reports no prior history of anemia and will be sent home with  hemoccult cards.   Of note, the patient had been a statin in the past, but this was  discontinued in the recent past.  LDL was 100 this admission and the patient  will follow up with Dr. Lucianne Muss regarding resumption of the stain.   MEDICATION ADJUSTMENTS THIS ADMISSION:  A down titration of aspirin to 81, a  down titration of Lasix to 40 daily and addition of supplemental iron  sulfate.   MEDICATIONS AT DISCHARGE:  1.  Aspirin 81 daily.  2.  Lasix 40 daily.  3.  Lisinopril 40 daily.  4.  Labetalol 300 b.i.d.  5.  Naprosyn 375 t.i.d. (x5 days).  6.  Dyazide 1 tablet daily.  7.  Iron sulfate 325 t.i.d.  8.  Metformin 500 mg b.i.d.  9.  Lantus insulin 40 units q.h.s.  10. NovoLog sliding scale 6-8 units at breakfast/lunch and 4-6 units at  supper.   INSTRUCTIONS:  1.  Followup BMET on Monday, February 19-results forwarded to Dr. Illene Regulus.  2.  The patient is instructed to arrange follow up with Dr. Illene Regulus      next week.  She will need follow up for her anemia and will be      discharged on hemoccult cards.  3.  Follow up with Dr. [name] on March 6, as previously scheduled.  4.  Follow up with Dr. Randa Evens in 1 year.   DISCHARGE DURATION:  Less than 30 minutes.      Rozell Searing, P.A. [facility]      Salvadore Farber, M.D. [facility]  Electronically Signed    GS/MEDQ  D:  11/28/2005  T:  11/28/2005  Job:  [id number]   cc:   [name]. Norins, M.D. [facility]  [address]  [place]  [place] [address]   [name], M.D.  Fax: [phone]

## 2011-03-01 NOTE — Cardiovascular Report (Signed)
NAME:  [name], [name]              ACCOUNT NO.:  [id number]   MEDICAL RECORD NO.:  [id number]          PATIENT TYPE:  INP   LOCATION:  6522                         FACILITY:  [facility]   PHYSICIAN:  Salvadore Farber, M.D. LHCDATE OF BIRTH:  Mar 03, 1928   DATE OF PROCEDURE:  03/22/2005  DATE OF DISCHARGE:                              CARDIAC CATHETERIZATION   PROCEDURE:  Left heart catheterization, coronary angiography, selective  bilateral renal angiography, intravascular ultrasound of the left anterior  descending, drug eluting stent placement in the proximal left anterior  descending, kissing balloon angioplasty of the first diagonal branch.   INDICATIONS FOR PROCEDURE:  Ms. Worsley is a 77 year old lady with coronary  artery disease.  She underwent cardiac catheterization in January 2005  demonstrating nonobstructive moderate disease, particularly of the proximal  LAD.  She has been managed medically since.  A functional study in the  interim demonstrated a small area of anterior ischemia.  Beginning  approximately three weeks ago, she has experienced 5 out of 10 substernal  chest discomfort associated with nausea and light headedness occurring while  cleaning her house and working in her yard.  The most recent episode lasted  for more than 30 minutes after the cessation of exercise.  She was then  hospitalized and ruled out for myocardial infarction by serial enzymes and  electrocardiograms.  Given her known disease, she was referred for  diagnostic angiography.   PROCEDURE TECHNIQUE:  Informed consent was obtained.  The patient was pre-  treated with sodium bicarbonate to prevent contrast nephropathy.  Under 1%  lidocaine local anesthesia, a 5 French sheath was placed in the right common  femoral artery using the modified Seldinger technique.  Diagnostic  angiography was performed using JL4 and JR4 catheters.  The JR4 catheter was  advanced into the left ventricle.  Pressures were  measured.  This catheter  was then pulled back and used to selectively engage the single renal  arteries bilaterally.  Angiography of these was performed by hand injection.   Diagnostic angiographic images demonstrated a region of at least moderate  and probably severe stenosis of the proximal LAD.  To better assess the  severity of the stenosis, I proceeded to intravascular ultrasound.  Anticoagulation was initiated with 15 units per kg of heparin to achieve an  ACT of greater than 250 seconds.  The sheath was upsized over a wire to 6  [place].  A 6 French TLS 3.5 guide was advanced over a wire and engaged in  the ostium of the left main.  Prowater wire was advanced to the distal LAD  without difficulty.  Intravascular ultrasound was performed by automated  pull back.  This demonstrated diffuse plaque of the proximal LAD with a  focal severe stenosis with residual lumen measuring 1.8 by 1.6 mm.  The  plaque continued up to the origin of the LAD.  The decision was made to  proceed with percutaneous revascularization.   ReoPro was administered.  The ACT was maintained at greater than 200  seconds.  The lesion was directly stented using a 3 by 20 mm Taxus  deployed  at 12 atmospheres.  Repeat angiography demonstrated plaque shift into the  ostium of the jailed diagonal resulting in a 90% stenosis.  The patient had  no pain or electrocardiographic abnormalities.  Flow remained TIMI III in  the diagonal.  A BMW was advanced out the side of the stent into the  diagonal.  I then post dilated the LAD using a 3.25 mm Powersail at 18  atmospheres for two sequential inflations.  The diagonal was then dilated  using a 2.5 by 9 mm Maverick at 8 atmospheres.  I then performed kissing  balloon angioplasty using a 3 mm Maverick in the LAD and a 2.5 mm Maverick  in the diagonal.  With this, the LAD balloon tended to migrate.  I,  therefore, pulled back the diagonal balloon and finished with an inflation   in the LAD only using a 3 mm balloon to 10 atmospheres.  Repeat angiography  demonstrated no residual stenosis in the LAD and less than 50% residual  stenosis in the diagonal.  The patient tolerated the procedure well and was  transferred to the holding room in stable condition.   COMPLICATIONS:  None.   FINDINGS:  1.  Left ventricle 205/18/23.  No left ventriculogram done.  2.  No aortic stenosis on pull back.  3.  Left main:  Angiographically normal.  4.  LAD:  Large vessel giving rise to a very large proximal diagonal that      functions as a ramus intermedius.  There is a 50% stenosis just after      the take off of the diagonal and then an 80% stenosis approximately 8 mm      down the stream.  These were both covered using a Taxus drug eluting      stent resulting in no residual stenosis.  The proximal portion of the      stent is at the ostium of the LAD.  5.  Circumflex:  Moderate size vessel giving rise to a single large obtuse      marginal.  It is angiographically normal.  6.  RCA:  Large, dominant vessel.  It is angiographically normal.  7.  Renal arteries:  Single vessels bilaterally, both are normal.   IMPRESSION/PLAN:  Successful drug eluting stent placement in the proximal  LAD with kissing balloon angioplasty of the jailed diagonal. Will be on  Plavix for at least a year, aspirin will be continued indefinitely.  Will  switch Zocor to Lipitor.  Will work on improving her blood pressure control.       WED/MEDQ  D:  03/22/2005  T:  03/22/2005  Job:  [id number]   cc:   [name]. Norins, M.D. [facility]

## 2011-03-01 NOTE — Discharge Summary (Signed)
NAME:  [name], [name]              ACCOUNT NO.:  [id number]   MEDICAL RECORD NO.:  [id number]          PATIENT TYPE:  INP   LOCATION:  6522                         FACILITY:  [facility]   PHYSICIAN:  Olga Millers, M.D. LHCDATE OF BIRTH:  [birth year]   DATE OF ADMISSION:  03/20/2005  DATE OF DISCHARGE:  03/23/2005                                 DISCHARGE SUMMARY   DISCHARGE DIAGNOSES:  1.  Chest pain with negative cardiac enzymes status post cardiac      catheterization with drug-eluting stent to the proximal left anterior      descending with kissing balloon percutaneous transluminal coronary      angioplasty of gel diagonal.  Plavix therapy x1 year.  2.  History of coronary artery disease with ejection fraction of 65%.  3.  Hypertension.  4.  Hyperlipidemia initiating Lipitor at discharge.  5.  Type 2 diabetes, stable.  6.  Anemia, stable.  7.  Hematuria, questionable secondary to trauma with Foley catheter      insertion.   PAST MEDICAL HISTORY:  1.  GERD.  2.  Arthritis.  3.  Depression.  4.  Glaucoma.  5.  Status post cholecystectomy in 2004.   DISPOSITION:  [name] is being discharged home after being seen by Dr.  Jens Som.  Diet as previous, low fat, low salt, diabetic.  Activity:  No  driving for two days, no lifting over 10 pounds x4 weeks, increase activity  slowly.  Patient may shower, no tub bathing x2 days.   MEDICATIONS:  1.  Lipitor 80 mg daily.  2.  Plavix 75 mg daily x1 year.  3.  Aspirin 325 mg daily.  4.  Lisinopril increase to 40 mg daily.  5.  Labetalol 300 mg p.o. b.i.d.  6.  Triamterene/HCTZ 37.5/25 mg daily.  7.  Patient to use nitroglycerin for chest discomfort.  8.  She may resume her metformin on March 25, 2005 p.m.  9.  She is to continue her insulin as previously taken.  10. We have discontinued her Lasix at this time.  Will follow up outpatient      if needed.   FOLLOWUP:  1.  She is to call our office for any fever, any pain or swelling  from her      cath site.  2.  She is to follow up with Dr. Debby Bud in two to four weeks for her anemia.      She also needs to be worked up for her increased LFTs.  This can be done      with Dr. Debby Bud also.  3.  We will have patient follow up with Dr. Samule Ohm in two weeks and check a      urine also secondary to hematuria.  4.  The patient will be scheduled for a BMET at our office in one week      secondary to mild renal insufficiency.   HOSPITAL COURSE:  [name] is a 77 year old African-American female with  history of CAD, who presented to [facility] with complaints of chest pain.  Initially her blood pressure was mildly  elevated.  Cardiac enzymes were  cycled.  The patient scheduled for cardiac catheterization for further  evaluation of chest discomfort.   Blood work showing a SGOT of 50 and a SGPT of 47 with a BUN of 33 and a  creatinine 1.5 on March 21, 2005.  Total cholesterol 216, triglycerides 294,  HDL of 32 and a LDL of 125.  Thyroid panel normal.   Patient to the cath lab on the 9th results as stated above.  Patient  tolerated the procedure without complications.  Post cath, hemoglobin 9.8  with hematocrit 29.6, BUN 19, creatinine 1.2.  Sinus bradycardia in the 50-  60s.   Patient being discharged home with medications as stated above.  Follow up  with Dr. Debby Bud for elevated LFTs and anemia.  Follow up with Dr. Samule Ohm  post intervention.  Check a UA and BMET in one week.      Mic   MB/MEDQ  D:  03/23/2005  T:  03/23/2005  Job:  [id number]

## 2011-03-01 NOTE — Op Note (Signed)
NAME:  [name], [name]                        ACCOUNT NO.:  [id number]   MEDICAL RECORD NO.:  [id number]                   PATIENT TYPE:  AMB   LOCATION:  DAY                                  FACILITY:  [facility]   PHYSICIAN:  Adolph Pollack, M.D.            DATE OF BIRTH:  1927-11-15   DATE OF PROCEDURE:  05/17/2003  DATE OF DISCHARGE:                                 OPERATIVE REPORT   PREOPERATIVE DIAGNOSIS:  Symptomatic cholelithiasis.   POSTOPERATIVE DIAGNOSIS:  Symptomatic cholecystitis.   PROCEDURE:  Laparoscopic cholecystectomy with intraoperative cholangiogram.   SURGEON:  Adolph Pollack, M.D.   ASSISTANT:  Rose Phi. Maple Hudson, M.D.   ANESTHESIA:  General.   INDICATION:  Ms. Chilton is a 77 year old female with diabetes, who is  having biliary colic type symptoms.  She has known gallstones.  She now  presents for elective cholecystectomy.  The procedure and the risks were  explained to her in detail preoperatively.   TECHNIQUE:  She was seen in the holding area, then brought to the operating  room, placed supine on the operating table, and a general anesthetic was  administered.  Her abdomen was then sterilely prepped and draped.  Local  anesthetic consisting of dilute Marcaine was infiltrated in the subumbilical  region, and a small subumbilical incision was made through the skin and  subcutaneous tissue until midline fascia was identified.  A small incision  was then made in the midline fascia, and the peritoneal cavity was entered  sharply and under direct vision.  A pursestring suture of 0 Vicryl was  placed around the fascial edges.  A Hasson trocar was introduced into the  peritoneal cavity, and pneumoperitoneum was created by insufflation of CO2  gas.   She was then placed in a reverse Trendelenburg position and partial left  lateral decubitus.  The laparoscope was introduced.  Under direct vision, 10  mm trocar was placed through an epigastric incision, and  two 5 mm trocars  were placed in the right abdomen.  The fundus of the gallbladder was  grasped.  No acute inflammation was noted.  It was retracted toward the  right shoulder.  The infundibulum was then grasped and retracted laterally  and using blunt dissection, the infundibulum was mobilized.  The cystic duct  at its junction with the gallbladder was identified and a window created  around it.  A clip was placed just above the cystic duct gallbladder  junction.  I then made an incision at the cystic duct gallbladder junction  and inserted a Cholangiocath into this which came through the anterior  abdominal wall.  A cholangiogram was then performed.   Using dilute contrast material, it was injected into the cystic duct.  It  was a long cystic duct.  The common hepatic, right and left hepatic, and  common bile ducts all opacified, and the contrast spilled rapidly into the  duodenum without obvious  evidence of obstruction.  Final report is pending  the radiologist's interpretation.   The cholangiocatheter was then removed.  The cystic duct was clipped three  times on the staying end side and divided.  I identified an anterior branch  and a posterior branch of the cystic artery, and these were clipped and  divided as well.  I then dissected the gallbladder free from the liver bed  intact with the cautery.  I irrigated out the gallbladder fossa, and then  bleeding points were controlled with cautery.  Once hemostasis was adequate,  I then removed the gallbladder through the subumbilical port and closed the  subumbilical fascial defect under laparoscopic vision by tightening up and  tying down the pursestring suture.  The perihepatic area was then irrigated  and fluid evacuated and was clear.  The remaining trocars were removed.  The  pneumoperitoneum was released, and the skin incisions were closed with 4-0  Monocryl subcuticular stitches.  Steri-Strips and sterile dressings were  applied.   She tolerated the procedure well without any apparent  complications and was taken to the recovery room in satisfactory condition.                                               Adolph Pollack, M.D.    Kari Baars  D:  05/17/2003  T:  05/17/2003  Job:  [id number]   cc:   [name]. Norins, M.D. [facility]

## 2011-03-01 NOTE — Discharge Summary (Signed)
NAME:  [name], [name]                        ACCOUNT NO.:  [id number]   MEDICAL RECORD NO.:  [id number]                   PATIENT TYPE:  INP   LOCATION:  3737                                 FACILITY:  [facility]   PHYSICIAN:  Lonzo Cloud. Kriste Basque, M.D. [facility]            DATE OF BIRTH:  1928/04/09   DATE OF ADMISSION:  11/10/2003  DATE OF DISCHARGE:  11/12/2003                                 DISCHARGE SUMMARY   FINAL DIAGNOSES:  1. Admitted November 10, 2003 by Dr. Debby Bud with chest pain.  Cardiac     catheterization this admission reveals moderate coronary artery disease     with 60-70% left anterior descending artery lesion.  Medical therapy     recommended by cardiology.  2. Atherosclerotic peripheral vascular disease with 50% left renal artery     stenosis also noted at catheterization.  3. Hypertension:  medications adjusted this admission.  4. History of hyperlipidemia with cholesterol 274 and LDL 180 this     admission.  Patient had stopped her Lipitor therapy.  Restarted it at     discharge.  5. Insulin-dependent diabetes mellitus:  She has recently been started on     Humulin N 2 shots a day.  Hemoglobin A1C 6.9 this admission.  6. Anemia:  Hemoglobin 12.3 on admission, 10.5 on recheck with MCV of 84.     This was not further evaluated this admission due to the short     hospitalization and will need to be worked up as an outpatient, where     records are available to indicate previous colon screening, etc.  7. Anxiety:  Patient under moderate stress due to family situation.  8. Status post gallbladder surgery, August, 2004.   BRIEF HISTORY AND PHYSICAL:  Patient is a 77 year old black female patient  of Dr. Debby Bud with history of hypertension, hyperlipidemia, and insulin-  dependent diabetes.  She was admitted on November 10, 2003 with chest pain.  The pain was felt to be atypical and without associated nausea, vomiting, or  diaphoresis.  She was admitted for EKG, enzymes, and  cardiac  catheterization.   PAST MEDICAL HISTORY:  As noted, she has a history of severe hypertension  and has been taking Accupril, labetalol, Norvasc, and Lasix.  The patient  did not know her doses or the names of the medications.  Blood pressure was  elevated on admission, and medications were adjusted this admission.  She  also has a history of insulin-dependent diabetes and has been on two shots  of Humulin N daily.  There is also indication of hyperlipidemia, but she  does not know her values and was on Lipitor but ran out some time ago.  She  does not recall any history of anemia but states she had gallbladder surgery  last summer.  She has been under a good bit of stress due to family  situation.   PHYSICAL EXAMINATION:  VITAL SIGNS:  Blood pressure 166/90, pulse 62 and  regular, respirations 20 per minute, not labored, temperature 98.4.  GENERAL:  An elderly black female in no acute distress.  HEENT:  Unremarkable.  CHEST:  Clear to auscultation and percussion.  HEART:  Normal S1 and S2 without murmurs, rubs or gallops heard.  ABDOMEN:  Soft and nontender without evidence of organomegaly or masses.  EXTREMITIES:  No clubbing, cyanosis or edema.  NEUROLOGIC:  Intact.   LABORATORY DATA:  EKG showed sinus bradycardia and nonspecific ST/T wave  changes.   Chest x-ray showed cardiomegaly and some mild vascular congestion.   Hemoglobin 12.3, repeat 10.5.  Hematocrit 36.9.  MCV 84.  White count 7200  with a normal differential.  Platelet count 194,000.  Pro time 14.6, INR  1.2.  PTT on heparin 191.  Sodium 141, potassium 4.1, chloride 110, CO2 24,  BUN 20, creatinine 11.0, blood sugar 96.  Follow-up blood sugar 128.  Total  bilirubin 0.7.  Alk phos 127.  SGOT 37, SGPT 41, total protein 7.9, albumin  4.2, calcium 9.8.  Hemoglobin A1C 6.9.  CPK 297 with 2% MB and negative  troponin.  Cholesterol 274, triglycerides 262, HDL 42, LDL 180.  TSH 2.25.   HOSPITAL COURSE:  Patient was  admitted with chest pain and ruled out for an  MI.  She was given morphine and Tylenol for discomfort.  She was seen by  cardiology and had a cardiac catheterization by Dr. Samule Ohm.  This revealed  good LV function and moderate coronary artery disease with a 60-70% stenosis  in the LAD.  There was also a 50% stenosis in the left renal artery.  He  recommended medical therapy, and her labetalol and Norvasc were adjusted.   Patient was also started back on her Lipitor and will need careful followup  in the office with modification of her risk factors.   Additional lab studies showed mild anemia with a hemoglobin of 12.3,  decreased to 10.5 after hydration with an MCV of 84.  She did not have  further evaluation due to the short hospitalization but will need workup as  an outpatient.   DISCHARGE MEDICATIONS:  1. Labetalol 200 mg tablets 2 tablets p.o. b.i.d.  2. Accupril 20 mg p.o. q.d.  3. Norvasc 10 mg p.o. q.d.  4. Protonix 40 mg q.d.  5. Lasix 40 mg p.o. q.d.  6. Aspirin 81 mg p.o. q.d.  7. Lipitor 40 mg p.o. q.h.s.  8. Humulin N insulin 10 units in the morning and 20 units in the evening, as     taken before.  9. She will use Tylenol for pain.  10.      Was given nitroglycerin to use as needed for substernal chest pain.   She will follow up with Dr. Debby Bud in one week.   CONDITION ON DISCHARGE:  Improved.                                                Lonzo Cloud. Kriste Basque, M.D. [facility]    SMN/MEDQ  D:  11/12/2003  T:  11/13/2003  Job:  [id number]   cc:   [name]. Norins, M.D. [facility]

## 2011-03-01 NOTE — Consult Note (Signed)
NAME:  [name], [name]              ACCOUNT NO.:  [id number]   MEDICAL RECORD NO.:  [id number]          PATIENT TYPE:  INP   LOCATION:  3735                         FACILITY:  [facility]   PHYSICIAN:  Arturo Morton. Riley Kill, M.D. [facility] OF BIRTH:  February 07, 1928   DATE OF CONSULTATION:  DATE OF DISCHARGE:                                   CONSULTATION   CHIEF COMPLAINT:  Chest pain.   HISTORY OF PRESENT ILLNESS:  The patient is a 77 year old female who  presents with a history of known coronary artery disease in January 2005.  She recently has had some upper left chest pain that happened about thee  weeks while on the treadmill.  It lasted about 45 minutes and was relieved  by rest.  She has had two to thee episodes since, one of then which woke her  two weeks ago.  The pain did not radiate much.  It has been helped by Aleve.  She contacted her primary care doctor and was sent here for admission.  She  has had some dyspnea of effort which has been worse recently and has had  some orthopnea.  She denies any PND.  Importantly, the patient apparently  has stopped taking some medications because she has been unable to afford  her medicines.   PAST MEDICAL HISTORY:  1.  Remarkable for catheterization in January 2005, at which time she had a      60-70% ostial left anterior descending artery and a 50% left renal      artery stenosis.  She has a hx2o f hypertension.  She also has diabetes      which is insulin requiring.  She has dyslipidemia.  2.  The patient has had prior cholecystectomy.  3.  Has had cataracts.   ALLERGIES:  No known drug allergies.   MEDICATIONS:  1.  Aleve.  2.  Labetalol 200 mg 2 tablets p.o. b.i.d.  3.  Valium.  4.  Tylenol.  5.  Humulin N 12 q.a.m., 5 at lunch, and 6 at bedtime.  6.  No other treatment at present.   SOCIAL HISTORY:  She lives in [place] and is a widow.  She has been  financially strapped since the death of her husband two to three years ago.   FAMILY HISTORY:  Mother died in her 30s.  She had no known coronary disease.  Her father died at 63 of an MI.  She had 13 siblings, and the history is  unclear.   REVIEW OF SYSTEMS:  The patient has had a weight gain of about 35 pounds  over the past six months.  She has had some headache.  There has been  shortness of breath and chest pain.  It has been nonradiating.  There are  some arthralgias as well as some depression and a lot of anxiety and stress  related to her bills.  All other review of systems are negative.   Importantly, the patient denies any specific GI complaints.  She has had no  obvious blood in her stool.   PHYSICAL EXAMINATION:  TEMPERATURE:  98.2.  PULSE:  58.  RESPIRATORY RATE:  28.  BLOOD PRESSURE:  204/105; now 150/80.  GENERAL:  She is well developed and slightly obese, in no acute distress.  NECK:  No carotid bruits.  Neck is supple.  LYMPHATIC:  No obvious lymphadenopathy.  LUNGS:  Lung fields are clear.  CARDIAC:  Rhythm is regular, with a PMI that is nondisplaced.  Normal first  and second heart sounds.  ABDOMEN:  Soft.  RECTAL:  Done by Lavella Hammock and is trace heme positive.  EXTREMITIES:  Reveal no obvious edema.   Electrocardiogram demonstrates normal sinus rhythm.  There are minor  nonspecific ST-T abnormalities.  No acute changes noted.   Hemoglobin 10.1, hematocrit 30.4, MCV 80, white count 7,300, platelet count  214.  BUN 13, creatinine 1.1, glucose 91, alkaline phosphatase 153.   IMPRESSION:  1.  Chest pain.  The patient's chest pain could be related to ischemia.  She      has no ischemic changes.  She does have a mild elevation in CK-MB, but      this was present previously, and the troponin is negative.  There is      some exertional component.  This could be related to ischemia.  She is      also anemic.  2.  Anemia, microcytic, with positive stools.  3.  Hypertension.  4.  Hyperlipidemia.  5.  Diabetes.  6.  Elevated alkaline  phosphatase.   RECOMMENDATIONS:  At the present time, I would get a better handle on her  microcytic anemia.  I would get iron studies.  She potentially may need to  have GI evaluation first.  All of this will be important as we try to decide  whether or not she will need repeat catheterization when the GI studies are  complete.  She did not have critical disease previously, but currently could  have some progression of disease.  Finally, some of the symptoms could be  from a combination of both anemia and not taking her medications.  This  issue will need to be addressed.       TDS/MEDQ  D:  09/17/2004  T:  09/17/2004  Job:  [id number]   cc:   [name]. Norins, M.D. [facility]   Salvadore Farber, M.D. [facility]  [address]  [address]  [place]  [place] [address]

## 2011-03-01 NOTE — H&P (Signed)
NAME:  Earnest, [name]              ACCOUNT NO.:  [id number]   MEDICAL RECORD NO.:  [id number]          PATIENT TYPE:  INP   LOCATION:  3735                         FACILITY:  [facility]   PHYSICIAN:  [name]. Norins, M.D. [facility] OF BIRTH:  1928-05-30   DATE OF ADMISSION:  09/17/2004  DATE OF DISCHARGE:                                HISTORY & PHYSICAL   HISTORY OF PRESENT ILLNESS:  Ms. Andalon is a 77 year old, African-American  woman with a known history of coronary artery disease by cardiac  catheterization in January 2005, which revealed a 60-70%% LAD lesion.  There  is also a 60% stenosis in the mid vessel.  The patient presents to the  office today reporting that she has had 5-6 days of what she referred to as  shoulder pain, but points to her left upper chest with radiation of pain to  her neck and arm accompanied by shortness of breath.  She reports that the  discomfort is enough to wake her up at night.  Cardiac risk profile with  known disease of diabetic, hypertensive, overweight and post menopausal.  The patient is now admitted to [facility] for cardiac evaluation  including cardiac consultation.   PAST SURGICAL HISTORY:  1.  Exploratory laparotomy, remote.  2.  Right corneal transplant, remote, which failed.  3.  Cholecystectomy in 2004.   PAST MEDICAL HISTORY:  1.  Usual childhood diseases.  2.  Insulin-dependent diabetes mellitus.  3.  GERD.  4.  Hypercholesterolemia.  5.  Arthritis.  6.  Hypertension.  7.  Depression.   FAMILY HISTORY:  Negative for breast cancer, colon cancer or coronary artery  disease.   HABITS:  No tobacco or alcohol.   CURRENT MEDICATIONS:  1.  Metformin 500 mg t.i.d.  2.  Labetalol 400 mg b.i.d.  3.  Hydrochlorothiazide 12.5 mg q.d.  4.  Tramadol 50 mg q.i.d. for pain.  5.  Quinapril 40 mg q.d.  6.  Humalog 12 units a.m., 5 units at lunch, 10 units q.h.s.  7.  Promethazine as needed.  8.  Diazepam as needed.   SOCIAL  HISTORY:  The patient is married x50 years.  She has two daughters,  four sons and three grandchildren.  She is a Press photographer.   REVIEW OF SYSTEMS:  Otherwise unremarkable.   PHYSICAL EXAMINATION:  VITAL SIGNS:  Temperature 99.7, blood pressure  148/82, pulse 64, weight 221.  GENERAL:  Obese, African-American female looking her stated age in no acute  distress.  HEENT:  Conjunctivae and sclerae clear.  Right lens is opacified.  Left lens  is clear and reactive.  Posterior pharynx without erythema or exudate.  NECK:  Supple without thyromegaly.  No adenopathy was noted in the cervical  or supraclavicular regions.  CHEST:  No CVAT.  LUNGS:  Clear to auscultation and percussion.  BREASTS:  Deferred.  CARDIAC:  2+ radial pulses and quiet precordium with regular rate and  rhythm, no murmurs, rubs or gallops.  ABDOMEN:  Obese, soft with no guarding or rebound, no organomegaly noted.  No tenderness was elicited.  PELVIC:  Deferred.  RECTAL:  Deferred.  EXTREMITIES:  Without clubbing, cyanosis, edema or deformity.  NEUROLOGIC:  Nonfocal.   LABORATORY DATA AND X-RAY FINDINGS:  A 12-lead electrocardiogram revealed a  sinus rhythm with extra systoles, extensive ST and T wave changes suggestive  of ischemia.   ASSESSMENT/PLAN:  1.  Cardiovascular.  The patient presents with unstable angina with known      disease of the left anterior descending and several risk factors.  Plan      is to admit to [facility].  Continue her home medications.      Start her on heparin.  Aspirin will be given.  Will get routine cardiac      labs.  Will have cardiology see the patient as a candidate for possible      repeat cardiac catheterization for progressive disease.  2.  Hypertension.  The patient's blood pressure is fairly well-controlled in      the office today at 148/82, but has been high at home, I suspect,      secondary to pain.  Plan is to continue on her home medications.  3.   Diabetes.  The patient is an insulin-dependent diabetic.  She has been      on Metformin which will be held during her hospitalization.  She will be      treated with sliding scale with moderate sensitivities using Humalog      before meals.       MEN/MEDQ  D:  09/17/2004  T:  09/17/2004  Job:  [id number]

## 2011-03-01 NOTE — Cardiovascular Report (Signed)
NAME:  [name], [name]                        ACCOUNT NO.:  [id number]   MEDICAL RECORD NO.:  [id number]                   PATIENT TYPE:  INP   LOCATION:  3737                                 FACILITY:  [facility]   PHYSICIAN:  Salvadore Farber, M.D.             DATE OF BIRTH:  01-23-1928   DATE OF PROCEDURE:  11/11/2003  DATE OF DISCHARGE:                              CARDIAC CATHETERIZATION   PROCEDURE:  Left heart catheterization, left ventriculography, coronary  angiography, selective left renal angiography.   INDICATIONS:  [name] is a 77 year old lady with hypertension, diabetes  mellitus, and dyslipidemia.  She has had exertional chest, shoulder, and arm  discomfort over the past 3 years.  Symptoms have been somewhat worse over  the past month and are most prominent in the morning.  She has severe  hypertension despite substantial doses of labetalol, Accupril, Norvasc, and  Lasix.  The plan is, therefore, for abdominal aortography and possible  selective renal angiography.   PROCEDURAL TECHNIQUE:  Informed consent was obtained.  Under 1% lidocaine  local anesthesia, a 6-French sheath was placed in the right femoral artery  using the modified Seldinger technique.  Diagnostic angiography was  performed using JL4 and JR4 catheters.  Ventriculography and left heart  catheterization were performed using a pigtail catheter.  The pigtail  catheter was then pulled back to the suprarenal abdominal aorta.  Abdominal  aortography was performed by a power injection.  This demonstrated a normal  right renal artery.  The single left renal artery had at least moderate  stenosis.  Decision was, therefore, made to perform selective left renal  angiography.  The JR4 was reintroduced and selectively engaged in the left  renal artery.  Angiography was performed by a hand injection.  The patient  tolerated the procedure well and was transferred to the holding room in  stable condition.  During  the case intravenous labetalol was administered in  an effort to control blood pressure and thereby facilitate sheath removal.   COMPLICATIONS:  None.   FINDINGS:  1. LV:  214/20/22.  EF 65% without regional wall motion abnormality.  2. No aortic stenosis or mitral regurgitation.  3. Single renal arteries bilaterally.  The right is normal.  The left has     approximately 50% stenosis.  There is less than 20% translesional     gradient when assessed with a 6-French catheter pullback.  4. Left main:  Angiographically normal.  5. LAD:  The LAD gives off a high diagonal versus ramus intermedius.  The     LAD just after the takeoff of this has a 60-70% ostial stenosis.  There     is a 60% stenosis of the mid vessel as well.  6. Ramus intermedius:  Large vessel with luminal irregularities.  7. Circumflex:  Moderate-sized vessel giving rise to a single obtuse     marginal which is angiographically normal.  8. RCA:  Large, dominant vessel.  There is a 20% stenosis of the proximal     vessel.   IMPRESSION/RECOMMENDATIONS:  The patient has moderate coronary artery  disease with 60-70% ostial left anterior descending stenosis with preserved  left ventricular systolic function.  She has extreme hypertension despite  her medicines.  I think that her hypertension is the predominant driver of  her angina.  Will, therefore, plan on  medical therapy of her coronary disease and aggressive medical therapy of  her hypertension.  The renal artery stenosis is not significant on  hemodynamic or angiographic __________.  Will increase her Norvasc to 10 mg  per day and increase her labetalol to 400 mg twice per day.                                               Salvadore Farber, M.D.    WED/MEDQ  D:  11/11/2003  T:  11/11/2003  Job:  [id number]   cc:   [place] Bing, M.D.

## 2011-03-01 NOTE — H&P (Signed)
NAME:  [name], [name]              ACCOUNT NO.:  [id number]   MEDICAL RECORD NO.:  [id number]          PATIENT TYPE:  INP   LOCATION:  1827                         FACILITY:  [facility]   PHYSICIAN:  Caldwell Bing, M.D. [facility] OF BIRTH:  [birth year]   DATE OF ADMISSION:  11/26/2005  DATE OF DISCHARGE:                                HISTORY & PHYSICAL   REFERRING:  Dr. Lynelle Doctor   PRIMARY CARE PHYSICIAN:  Dr. Debby Bud   PRIMARY CARDIOLOGIST:  Dr. Samule Ohm   HISTORY OF PRESENT ILLNESS:  A 77 year old woman who presented to the  emergency department after 4 days of primarily shoulder and back pain. Ms.  [name] has known coronary artery disease, first identified in 2005 when  catheterization revealed moderately-severe left anterior descending coronary  disease with no significant focal disease in the circumflex and RCA.  Although a stress test revealed ischemia in this region, she was treated  medically until June 2006 when she returned with recurrent chest discomfort.  A drug-eluting stent was placed in the LAD with resolution of her symptoms.  She has done well since until approximately 4 days ago when she developed  moderately-severe discomfort in the left subscapular region. This was  exacerbated with movement of the trunk and arm and made it difficult for her  to get out of bed. She subsequently developed pain in the right shoulder as  well. She has had some dull anterior chest discomfort. There has been no  associated dyspnea nor diaphoresis. She came to the emergency room because  of increasing pain and now is markedly improved after initial treatment with  intravenous morphine.   [name] has multiple contributors to her cardiovascular disease  including hypertension, hyperlipidemia and diabetes. Blood sugar was  recorded as 25 in the emergency department. The patient reports burning in  her back, which she feels is her hypoglycemic equivalent. She has been given  D50 with  improvement.   Past medical history is otherwise notable for GERD, arthritis, depression,  glaucoma, anemia and a cholecystectomy in 2004.   The patient has no known allergies.   Recent medications include:  1.  Labetalol 300 mg b.i.d.  2.  Furosemide 80 mg daily.  3.  Dyazide one daily.  4.  Lisinopril 40 mg daily.  5.  Metformin 500 mg b.i.d.  6.  She also uses Demerol occasionally as needed for headache.  7.  Humulin N 12 units q.a.m. and 40 units q.p.m.   She previously was treated with a statin drug, but reports Dr. Lucianne Muss advised  her to discontinue it.   SOCIAL HISTORY:  Lives in [place] alone; six adult children; no tobacco  nor alcohol use.   FAMILY HISTORY:  Father died at age 55 with CVA; mother had no chronic  illnesses. Multiple siblings, some of whom have diabetes and/or cardiac  disease.   REVIEW OF SYSTEMS:  Visual loss in the right eye; occasional migraine  headache. Relatively sedentary lifestyle. All other systems reviewed and are  negative.   PHYSICAL EXAMINATION:  GENERAL:  Pleasant overweight woman in no acute  distress.  VITAL SIGNS:  The blood pressure is 150/65, heart rate 65 and regular,  respirations 16.  HEENT:  EOMs full; sclera anicteric.  NECK:  No jugular venous distension; normal carotid upstrokes without  bruits.  ENDOCRINE:  No thyromegaly.  HEMATOPOIETIC:  No adenopathy.  SKIN:  No significant lesions.  LUNGS:  Clear.  CARDIAC:  Normal first and second heart sounds; normal PMI; fourth heart  sound present.  ABDOMEN:  Soft and nontender; obese; no organomegaly; normal bowel sounds  without bruits.  EXTREMITIES:  Distal pulses intact; one-half-plus ankle edema.  NEUROMUSCULAR:  Symmetric strength and tone; normal cranial nerves.  MUSCULOSKELETAL:  Movement with slight discomfort; tenderness in the left  infrascapular region; full range of motion in the shoulders without  discomfort.  PSYCHIATRIC:  Normal affect; normal  orientation.   EKG:  Normal sinus rhythm with PACs; otherwise, unremarkable.   Initial laboratory shows a white blood cell count elevated at 13,100 and  anemia with a hemoglobin of 10.3; hematocrit of 30.4; and an MCV of 79.  Electrolytes are normal. Creatinine is 1.5. BNP 68. The point-of-care  markers are negative.   IMPRESSION:  [name] presents with noncardiac back and shoulder pain.  Her symptoms are most consistent with a musculoskeletal etiology. Because  she requires pain control and because of associated dull, substernal aching,  she will be admitted for serial cardiac markers, EKGs and initial medical  therapy. Naprosyn will be started at a dose of 375 mg t.i.d.. Renal function  must be watched since creatinine is borderline. Additional morphine will be  given as needed. If symptoms persist, x-ray studies and orthopedic  consultation will be obtained as appropriate.   [name] was reportedly hypoglycemic in the emergency department. She  will be treated with intravenous D5W for a few hours and then all  intravenous fluids will be discontinued. We will monitor capillary blood  glucose values off therapy. Hemoglobin A1c is pending. Metformin will be  discontinued due to marginal regional function. We will ask Dr. Lucianne Muss to see  the patient to readjust her insulin regimen.   Control of hypertension appears slightly suboptimal at present. Medications  will be adjusted as appropriate.   In all likelihood, she would benefit from treatment with a statin  medication. A fasting lipid profile will be obtained. We will seek Dr.  Remus Blake records to determine if he actually told her to discontinue this  class of drug. If not, one will be added to her medical regime.   The patient has a moderate anemia. Hemoglobin and hematocrit were similar in  June 2006. This requires evaluation. Stool for hemoccult testing and iron studies will be obtained. She may require hemoglobin  electrophoresis or  hematologic consultation.      [place] Bing, M.D. [facility]  Electronically Signed     RR/MEDQ  D:  11/26/2005  T:  11/26/2005  Job:  [id number]

## 2011-03-01 NOTE — H&P (Signed)
NAME:  [name], [name].                       ACCOUNT NO.:  [id number]   MEDICAL RECORD NO.:  [id number]                   PATIENT TYPE:  INP   LOCATION:                                       FACILITY:  [facility]   PHYSICIAN:  [name]. Norins, M.D. [facility]         DATE OF BIRTH:  10/26/1927   DATE OF ADMISSION:  11/10/2003  DATE OF DISCHARGE:  11/12/2003                                HISTORY & PHYSICAL   ADMISSION DIAGNOSIS:  Unstable angina.   HISTORY OF PRESENT ILLNESS:  [name] is a 77 year old African-American  woman who presented to the office complaining of a four to five-week history  of pain in her left shoulder with radiation to her jaw and her left arm  accompanied by substernal chest pressure and shortness of breath.  The  patient reports that she does have increased discomfort with exertion.  She  reports that over the last several weeks she has increasing discomfort with  less exertion.   CARDIAC RISK FACTORS:  Postmenopausal, female, obese, with hypertension,  hyperlipidemia, diabetes.   The patient did have a stress Cardiolite in June 2004, which was read out as  normal with an ejection fraction of 60%.  The patient now has high risk,  presenting with unstable angina, and is admitted for cardiac evaluation and  probable cardiac catheterization.   PAST MEDICAL HISTORY:  Surgical:  Exploratory laparotomy, remote; right  corneal transplant, remote; cholecystectomy in 2004.   Medical illnesses:  The usual childhood diseases.  The patient had  noninsulin-dependent diabetes, GERD, hypercholesterolemia, arthritis,  hypertension, and history of depression.   FAMILY HISTORY:  Negative for breast cancer, colon cancer, or coronary  artery disease.  Positive for diabetes.   HABITS:  Tobacco:  None.  Alcohol:  None.   MEDICATIONS:  1. Hydrochlorothiazide with triamterene 37.5/25 mg once daily.  2. Humalog 6 units q.a.m., 30 units q.p.m.  3. Labetalol 300 mg b.i.d.  4.  Accupril 20 mg daily.  5. Norvasc 5 mg daily.  6. Prevacid 30 mg daily.  7. Lasix 40 mg daily.   SOCIAL HISTORY:  The patient has been married for 50 years.  She has two  daughters, four sons, three grandchildren.  She is a Press photographer.   REVIEW OF SYSTEMS:  Negative for any constitutional, cardiovascular,  respiratory, GI, or GU problems except as noted above.   PHYSICAL EXAMINATION:  VITAL SIGNS:  Temperature was 99.1, blood pressure  160/90, pulse 68, weight 211.  GENERAL:  This is an obese black female who looks her stated age.  She is in  no acute distress.  HEENT:  Normocephalic, atraumatic. EACs and TMs were normal.  The patient's  oropharynx was without lesions.  Conjunctivae are somewhat injected.  Pupils  equal, round, and reactive.  NECK:  Supple.  There was no thyromegaly.  The patient does have an enlarged  submental salivary gland which is nontender.  Posterior pharynx  without  erythema or exudate.  Neck supple without thyromegaly.  NODES:  No adenopathy was noted in the cervical or supraclavicular regions.  CHEST:  No CVA tenderness.  Lungs were clear to auscultation and percussion  with good breath sounds.  CARDIOVASCULAR:  2+ radial pulses, no JVD, no carotid bruits.  There is a  quiet precordium with a regular rate and rhythm but her heart sounds were  distant.  ABDOMEN:  Obese, nontender, with no guarding or rebound.  No  organosplenomegaly.  PELVIC, RECTAL:  Deferred.  EXTREMITIES:  Without clubbing, cyanosis, edema, or deformity.  NEUROLOGIC:  Nonfocal.   Twelve-lead electrocardiogram revealed a sinus rhythm.  It looks like she  had an interventricular conduction delay.  There is no acute change, but  there is evidence of ST-T wave flattening suggestive of a possible inferior  or lateral ischemia.   </ASSESSMENT AND PLAN>  1. Cardiovascular:  The patient is at high risk with multiple risk factors     as listed above, who presents with symptoms that  are classic for     exertional angina.  Plan:  The patient is admitted to a telemetry unit.     I will get routine laboratories.  Will start her on IV nitroglycerin.     Will add beta blocker, aspirin, heparin.  Cardiology has been notified     and will see her in consultation.  2. Diabetes:  The patient is followed by [name], M.D.  Last hemoglobin     A1C in October 2004 was 8.4%.  Plan:  The patient will be put on a     sliding scale while in hospital.  3. Hypertension:  The patient has been poorly controlled.  At this time will     continue her present medications with final adjustments at time of     discharge.                                                [name] Norins, M.D. [facility]    MEN/MEDQ  D:  11/10/2003  T:  11/10/2003  Job:  [id number]

## 2011-03-01 NOTE — H&P (Signed)
NAME:  [name], [name]              ACCOUNT NO.:  [id number]   MEDICAL RECORD NO.:  [id number]          PATIENT TYPE:  EMS   LOCATION:  MAJO                         FACILITY:  [facility]   PHYSICIAN:  Olga Millers, M.D. LHCDATE OF BIRTH:  1927-12-16   DATE OF ADMISSION:  03/20/2005  DATE OF DISCHARGE:                                HISTORY & PHYSICAL   PRIMARY CARE PHYSICIAN:  Dr. Illene Regulus.   PRIMARY CARDIOLOGIST:  Dr. Randa Evens.   PATIENT PROFILE:  A 77 year old African-American female with history of CAD  who presents with chest pain.   PROBLEM LIST/PAST MEDICAL HISTORY:  1.  CAD.      1.  November 11, 2003 cardiac catheterization:  EF 65%; left renal artery          50%; left main normal; LAD 60-70% ostial, 60% mid; ramus minor          irregularity; circumflex normal; OM-1 normal; RCA dominant with 20%          lesion proximally.      2.  September 26, 2004 adenosine Myoview:  EF 61%, mild reversibility in          the anterior wall.  2.  Hypertension.  3.  Hyperlipidemia.  4.  Type 2 diabetes mellitus.  5.  GERD.  6.  Arthritis.  7.  Depression.  8.  Glaucoma.  9.  Anemia.  10. Status post cholecystectomy in 2004.   HISTORY OF PRESENT ILLNESS:  A 77 year old African-American female with  history of CAD, hypertension, hyperlipidemia, and diabetes who was evaluated  with catheterization January 2005 with catheterization showing medically-  manageable disease. She presented again in December 2005 and ruled out and  was discharged and underwent an outpatient functional study which suggested  a small area of anterior ischemia. She never followed up after this. She  says that she did well since December until approximately 3 weeks ago when  she experienced 5/10 substernal chest tightness associated with nausea and  lightheadedness that occurred while cleaning up around her house. Symptoms  lasted about 30 minutes and were relieved with rest. She is not real clear  as  to how frequently she has been having symptoms since then, but just last  night she had just finished mowing and then raking her yard and developed  5/10 substernal chest pressure and tightness associated with nausea and  lightheadedness that lasted about 30 or 60 minutes and were relieved with  rest. This morning she saw her PCP and she was sent on to the ED for  evaluation. She is currently pain free.   ALLERGIES:  No known drug allergies.   HOME MEDICATIONS:  1.  Labetalol 300 mg b.i.d.  2.  Lasix 80 mg daily.  3.  Hydrochlorothiazide/triamterene 25/37.5 mg daily.  4.  Valium 5 mg daily.  5.  Accupril 20 mg daily.  6.  Metformin 500 mg b.i.d.  7.  Bromfenex one tablet b.i.d. p.r.n.   FAMILY HISTORY:  Mother died at age 89 of old age. Father died at age 22 of  CVA. Siblings:  She  had 12 brothers and sisters. There was diabetes and  heart disease in her siblings.   SOCIAL HISTORY:  She lives in [place] by herself and is retired. She has  six grown children. She never smoked or used alcohol or drugs. She says she  uses a StairMaster twice a week.   REVIEW OF SYSTEMS:  Positive for right eye vision loss status post retinal  transplant which failed. She has glaucoma in her left eye. Positive for  chest pain, positive for nausea, positive for diabetes. All other systems  reviewed and are negative.   PHYSICAL EXAMINATION:  VITAL SIGNS:  Temperature 97.1, heart rate 51,  respirations 16, blood pressure 147/67, pulse oximetry 97% on room air.  GENERAL:  Pleasant African-American female in no acute distress. Awake,  alert, and oriented x3.  HEENT:  She has erythema noted of the left eye/sclera. The pupil is clouded  on the right. Otherwise, normocephalic and atraumatic.  NECK:  Normal carotid upstrokes. No bruits or JVD.  LUNGS:  Respirations regular and unlabored, clear to auscultation.  CARDIAC:  Regular, S1, S2. No S3, S4, murmurs.  ABDOMEN:  Obese, soft, nontender,  nondistended. Bowel sounds present x4.  EXTREMITIES:  Warm, dry, pink, with trace lower extremity edema. Dorsalis  pedis and posterior tibial pulses 2+ and equal bilaterally. No femoral  bruits are noted.   Chest x-ray is pending. ECG shows sinus rhythm. All of her labs are pending.   ASSESSMENT AND PLAN:  1.  Chest pain/coronary artery disease. The patient has been having      exertional chest pain with nausea and lightheadedness that is relieved      with rest. Symptoms are concerning for unstable angina. She does have      known LAD disease with a mildly positive functional study in December      2005 which she never followed up on. Will plan to rule her out and admit      her with plans for catheterization in the a.m. Will add aspirin, statin,      and heparin. Continue beta blocker, ACE inhibitor. Will hold her      Glucophage.  2.  Hypertension. Blood pressure is mildly elevated. Will continue her home      regimen and titrate if necessary. We are going to hold all of her      diuretics at this time - it is not clear why she is on three diuretics.  3.  Hyperlipidemia.  Will check her lipid status and add statin therapy. It      is not clear why this diabetic with history of CAD was not on statin      previously.  4.  Type 2 diabetes mellitus. Check hemoglobin A1c. Will hold her metformin      and add sliding scale insulin.  5.  Depression, stable. She does not currently take anything at home for      this.  6.  Anemia. Her labs are currently pending but will follow this closely.  7.  Glaucoma. The patient is not sure of what eyedrops she takes but will      have to resume these. Her son is going to bring a list.  8.  Arthritis stable without complaints.      CRB/MEDQ  D:  03/20/2005  T:  03/20/2005  Job:  [id number]

## 2011-03-01 NOTE — Consult Note (Signed)
NAME:  [name]                        ACCOUNT NO.:  [id number]   MEDICAL RECORD NO.:  [id number]                   PATIENT TYPE:  INP   LOCATION:  3737                                 FACILITY:  [facility]   PHYSICIAN:  Lincolnia Bing, M.D.               DATE OF BIRTH:  01-May-1928   DATE OF CONSULTATION:  11/10/2003  DATE OF DISCHARGE:                                   CONSULTATION   HISTORY OF PRESENT ILLNESS:  This is a 77 year old woman with multiple  cardiovascular risk factors including hypertension, diabetes and  hyperlipidemia, presenting with recurrent episodes of chest tightness  associated with left neck, left shoulder and left arm discomfort.  Ms.  [name] dates the onset of her symptoms to approximately three years ago.  She typically experiences discomfort when she is anxious or upset.  She has  a sedentary lifestyle, but there is no relationship to exertion.  There is  no associated dyspnea, diaphoresis nor nausea.  The pain is relieved by  arising from a recumbent position and moving about.  She frequently  experiences these symptoms in the morning - they resolve within one hour or  so.  A stress Cardiolite study was performed in 2004 and was reportedly  negative.  She is unable to characterize exactly how often she experiences  symptoms, but it is well less than daily.  She called yesterday to report  increasing discomfort and was seen in the office today and admitted  specifically for cardiac catheterization.   CURRENT MEDICATIONS:  1. Aspirin 325 mg q. d.  2. Labetalol 300 mg b.i.d.  3. Accupril 20 mg q. d.  4. Norvasc 5 mg q. d.  5. Protonix 40 mg q. d.  6. Furosemide 40 mg q. d.   Hypertension has been present for the past ten years or so.  She has had  diabetes for 20 years.  She reports hyperlipidemia, but does not seem to be  receiving pharmacologic therapy.  She has chronic back pain.   PAST SURGICAL HISTORY:  Cholecystectomy last year.   SOCIAL  HISTORY:  She lives in [place] with her son; widowed; homemaker.  She has six children.  No prior use of tobacco products nor significant  alcohol.   FAMILY HISTORY:  Mother died at an advanced age; father died at age 35 due  to myocardial infarction.  She has 13 siblings, none of whom have known  coronary disease.   REVIEW OF SYMPTOMS:  PSYCHOLOGIC:  Notable for depression and anxiety,  particularly since the death of her husband three years ago.  EXTREMITIES:  She has occasional muscle cramps.  She notes some ankle edema.  GENERAL:  She has occasional dizziness, particularly when she is upset.  All other  systems have been reviewed and are negative.   PHYSICAL EXAMINATION:  GENERAL:  This is an overweight, pleasant woman in no  acute distress.  TEMPERATURE:  98.4  HEART RATE:  60 and regular.  RESPIRATIONS:  20  BLOOD PRESSURE:  165/90  HEENT:  Clouding of left cornea.  NECK:  No jugulovenous distension; no carotid bruits.  HEMATOPOIETIC:  No adenopathy.  ENDOCRINE:  No thyromegaly.  CARDIOVASCULAR:  Normal first and second heart sounds; modest early systolic  ejection murmur.  LUNGS:  Clear with decreased breath sounds at the bases.  ABDOMEN:  Soft and nontender; no organomegaly; aortic pulsation not  appreciated; no bruits.  SKIN:  No significant lesions.  NEUROMUSCULAR:  Symmetric strength and tone.  MUSCULOSKELETAL:  Full range of motion in all joints.   INITIAL LABORATORY:  Notable for normal CBC.  Cardiac markers are pending.  EKG:  normal sinus rhythm; within normal limits.   IMPRESSION:  [name] presents with chest pain with a worrisome quality,  but nonetheless which must be considered atypical due to the fact that it is  not related to exertion.  Her symptoms have resisted empiric therapy and she  has already had a negative stress Cardiolite study, but required admission  for her symptoms.  Accordingly, coronary angiography is warranted to  establish a  definite diagnosis and to guide therapy.  The risks and benefits  of this procedure were discussed with [name].  She is not eager to  undergo the test, but agrees to proceed.   We will reassess her cardiovascular risk factors and adjust her medications  accordingly.  If she does have any significant hyperlipidemia, she certainly  merits treatment with a statin.  Blood pressure control is suboptimal, but  she is experiencing considerable stress in the emergency department.  No  change will be made in her medication at present.  Initial therapy will be  heparin, intravenous nitroglycerin and continuation of aspirin.  Bradycardia  precludes significant beta blocker use.                                               Shiloh Bing, M.D.    RR/MEDQ  D:  11/10/2003  T:  11/11/2003  Job:  [id number]

## 2011-04-02 ENCOUNTER — Encounter: Payer: Self-pay | Admitting: Cardiovascular Disease

## 2011-04-03 ENCOUNTER — Encounter: Payer: Self-pay | Admitting: Cardiovascular Disease

## 2011-04-03 ENCOUNTER — Ambulatory Visit (INDEPENDENT_AMBULATORY_CARE_PROVIDER_SITE_OTHER): Payer: Medicare Other | Admitting: Cardiovascular Disease

## 2011-04-03 VITALS — BP 136/64 | HR 65 | Ht 64.0 in | Wt 212.0 lb

## 2011-04-03 DIAGNOSIS — I2510 Atherosclerotic heart disease of native coronary artery without angina pectoris: Secondary | ICD-10-CM

## 2011-04-03 NOTE — Patient Instructions (Signed)
Your physician recommends that you schedule a follow-up appointment in: 1 year  

## 2011-04-03 NOTE — Assessment & Plan Note (Signed)
Stable. Continue current meds including statin, ASA and beta blocker.

## 2011-04-03 NOTE — Progress Notes (Signed)
History of Present Illness:[name] is a pleasant 77 year old African American female with a past medical history significant for  hypertension, hyperlipidemia, known coronary artery disease status post placement of a drug-eluting stent in the LAD in 2006, renal artery stenosis, diabetes mellitus, anemia, GERD, osteoarthritis, and chronic kidney disease who presents today for routine cardiac follow up.   She has been doing well except for her chronic back pain. She notes soreness all over her body. She has had no chest pain, SOB, palpitations, near syncope or syncope. She does have mild dyspnea with exertion. She has no lower ext edema. Overall, feeling well.  I reviewed old records. She had imaging of the L spine in 2008 and had OA/DDD. She has been using Tramadol as needed per Dr. Debby Bud. She also uses Tylenol as needed.    Past Medical History  Diagnosis Date  . Coronary artery disease   . Renal artery stenosis   . Hypertension   . Dyslipidemia   . GERD (gastroesophageal reflux disease)   . Tension headache   . Abdominal pain   . Anemia, unspecified   . Gout   . Back pain, chronic   . History of tonsillectomy   . History of cholecystectomy   . Depression   . IDDM (insulin dependent diabetes mellitus)     Past Surgical History  Procedure Date  . Tonsillectomy   . Cholecystectomy     Current Outpatient Prescriptions  Medication Sig Dispense Refill  . allopurinol (ZYLOPRIM) 300 MG tablet Take 300 mg by mouth daily.        Marland Kitchen aspirin 81 MG tablet Take 81 mg by mouth daily.        . bimatoprost (LUMIGAN) 0.03 % ophthalmic solution Take as directed.       Marland Kitchen CALCITRIOL PO Take by mouth daily.        . citalopram (CELEXA) 20 MG tablet Take 20 mg by mouth daily.        . colchicine 0.6 MG tablet Take 0.6 mg by mouth daily.        . diazepam (VALIUM) 5 MG tablet Take 5 mg by mouth every 6 (six) hours as needed.        . ergocalciferol (VITAMIN D2) 50000 UNITS capsule Take 50,000  Units by mouth once a week.        . furosemide (LASIX) 80 MG tablet Take 80 mg by mouth. 1 and 1/2 po daily       . gabapentin (NEURONTIN) 600 MG tablet Take 1 tablet (600 mg total) by mouth 3 (three) times daily.  90 tablet  2  . hydrALAZINE (APRESOLINE) 100 MG tablet Take 100 mg by mouth 3 (three) times daily.        . insulin glargine (LANTUS) 100 UNIT/ML injection Inject 36 Units into the skin at bedtime.        . insulin lispro (HUMALOG) 100 UNIT/ML injection Inject into the skin. Take as directed.       . metoprolol tartrate (LOPRESSOR) 25 MG tablet Take 75 mg by mouth 2 (two) times daily.        . nitroGLYCERIN (NITROSTAT) 0.4 MG SL tablet Place 0.4 mg under the tongue as needed. For chest pain.       . pravastatin (PRAVACHOL) 80 MG tablet Take 80 mg by mouth at bedtime.        Marland Kitchen DISCONTD: Lactulose SOLN 60 oz by Does not apply route 2 (two) times daily. Disp x14 days.       Marland Kitchen  DISCONTD: paricalcitol (ZEMPLAR) 1 MCG capsule Take 1 mcg by mouth daily.          No Known Allergies  History   Social History  . Marital Status: Widowed    Spouse Name: N/A    Number of Children: 6  . Years of Education: N/A   Occupational History  . Retired    Social History Main Topics  . Smoking status: Not on file  . Smokeless tobacco: Not on file  . Alcohol Use:   . Drug Use:   . Sexually Active:    Other Topics Concern  . Not on file   Social History Narrative   Married over 50 years.8th grade-went to work.    Family History  Problem Relation Age of Onset  . Coronary artery disease Father   . Hypertension Father   . Heart attack Father   . Diabetes Sister   . Hypertension Sister   . Hypertension Brother   . Diabetes Brother   . Cancer Neg Hx     Breast and colon    Review of Systems:  As stated in the HPI and otherwise negative.   BP 136/64  Pulse 65  Ht 5\' 4"  (1.626 m)  Wt 212 lb (96.163 kg)  BMI 36.39 kg/m2  Physical Examination: General: Well developed, well  nourished, NAD HEENT: OP clear, mucus membranes moist SKIN: warm, dry. No rashes. Neuro: No focal deficits Musculoskeletal: Muscle strength 5/5 all ext Psychiatric: Mood and affect normal Neck: No JVD, no carotid bruits, no thyromegaly, no lymphadenopathy. Lungs:Clear bilaterally, no wheezes, rhonci, crackles Cardiovascular: Regular rate and rhythm. No murmurs, gallops or rubs. Abdomen:Soft. Bowel sounds present. Non-tender.  Extremities: No lower extremity edema. Pulses are 2 + in the bilateral DP/PT.

## 2011-04-08 ENCOUNTER — Ambulatory Visit (INDEPENDENT_AMBULATORY_CARE_PROVIDER_SITE_OTHER)
Admission: RE | Admit: 2011-04-08 | Discharge: 2011-04-08 | Disposition: A | Discharge status: Home / Self Care | Payer: Medicare Other | Source: Ambulatory Visit | Attending: Internal Medicine | Admitting: Internal Medicine

## 2011-04-08 ENCOUNTER — Ambulatory Visit (INDEPENDENT_AMBULATORY_CARE_PROVIDER_SITE_OTHER): Payer: Medicare Other | Admitting: Internal Medicine

## 2011-04-08 ENCOUNTER — Telehealth: Payer: Self-pay | Admitting: *Deleted

## 2011-04-08 ENCOUNTER — Encounter: Payer: Self-pay | Admitting: Internal Medicine

## 2011-04-08 VITALS — BP 144/78 | HR 58 | Temp 99.2°F | Resp 16 | Wt 223.8 lb

## 2011-04-08 DIAGNOSIS — M549 Dorsalgia, unspecified: Secondary | ICD-10-CM

## 2011-04-08 DIAGNOSIS — I10 Essential (primary) hypertension: Secondary | ICD-10-CM

## 2011-04-08 DIAGNOSIS — E109 Type 1 diabetes mellitus without complications: Secondary | ICD-10-CM

## 2011-04-08 MED ORDER — DIAZEPAM 5 MG PO TABS: 5.0000 mg | ORAL_TABLET | Freq: Four times a day (QID) | ORAL | Status: DC | PRN

## 2011-04-08 NOTE — Telephone Encounter (Signed)
Pharm called - Pt was given lorazepam bid # 30 from Dr Lucianne Muss. Spoke w/Dr Norins and patient. She is clear that both meds are not to be taken together and she must choose one. She will complete lorazepam RX and then fill diazepam. She will no longer take lorazepam after this current rx. Pharmacist is aware.

## 2011-04-08 NOTE — Progress Notes (Signed)
Subjective:    Patient ID: [name], female    DOB: September 25, 1928, 77 y.o.   MRN: [id number]  HPI Mrs. Uvaldo Rising presents for multiple problems. She has chronic back pain that is getting worse. The pain interferes with sleep and prevents her from doing many of her ADLs, especially housework. She has not had an evaluation by surgery. Last xrays lumbar spine in 2008. She also c/o dizziness  and finds that she will have to sit down. She has not fallen. She reports that she will have headaches with the dizziness.   For her diabetes she follows with Dr. Lucianne Muss. He checks her A1C and other related labs. She reports that her blood sugar has been all right. She would like to see a dietician to help with a weight loss program.  Past Medical History  Diagnosis Date  . Coronary artery disease   . Renal artery stenosis   . Hypertension   . Dyslipidemia   . GERD (gastroesophageal reflux disease)   . Tension headache   . Abdominal pain   . Anemia, unspecified   . Gout   . Back pain, chronic   . History of tonsillectomy   . History of cholecystectomy   . Depression   . IDDM (insulin dependent diabetes mellitus)    Past Surgical History  Procedure Date  . Tonsillectomy   . Cholecystectomy    Family History  Problem Relation Age of Onset  . Coronary artery disease Father   . Hypertension Father   . Heart attack Father   . Hyperlipidemia Father   . Diabetes Sister   . Hypertension Sister   . Hyperlipidemia Sister   . Hypertension Brother   . Diabetes Brother   . Hyperlipidemia Brother   . Cancer Neg Hx     Breast and colon   History   Social History  . Marital Status: Widowed    Spouse Name: N/A    Number of Children: 6  . Years of Education: N/A   Occupational History  . Retired    Social History Main Topics  . Smoking status: Never Smoker   . Smokeless tobacco: Not on file  . Alcohol Use: No  . Drug Use: No  . Sexually Active: Not on file   Other Topics Concern  .  Not on file   Social History Narrative   Married over 50 years.8th grade-went to work.Physician Roster: Endo- KumarCardo- McAlhaneyOpthal- Groat      Review of Systems Review of Systems  Constitutional:  Negative for fever, chills, activity change and unexpected weight change.  HEENT:  Negative for hearing loss, ear pain, congestion, neck stiffness and postnasal drip. Negative for sore throat or swallowing problems. Negative for dental complaints.   Eyes: Positive  for vision loss or change in visual acuity due to cataracts. She is to have cataract surgery in August '12.  Respiratory: Negative for chest tightness and wheezing. DOE  Cardiovascular: Negative for chest pain and palpitation. No decreased exercise tolerance Gastrointestinal: No change in bowel habit. No bloating or gas. No reflux or indigestion Genitourinary: Negative for urgency, frequency, flank pain and difficulty urinating.  Musculoskeletal: Negative for myalgias, back pain, arthralgias and gait problem.  Neurological: Positive for dizziness. Negative tremors, weakness. Positive for headaches.  Hematological: Negative for adenopathy.  Psychiatric/Behavioral: Negative for behavioral problems and dysphoric mood.       Objective:   Physical Exam Vitals noted - very overweight Gen'l - obese AA woman in no distress Chest -  no deformity Pulmonary - normal breath sounds, no increased WOB or shortness of breath. Cor - RRR       Assessment & Plan:

## 2011-04-08 NOTE — Patient Instructions (Signed)
Diabetes- will leave testing and medication management to Dr. Lucianne Muss  Weight management - ask Dr. Lucianne Muss to refer you to the diabetic dietician he has been working with.  Back pain - will get x-rays today. Last x-rays in '08. Will look to see if there is anything that can be done to relieve your pain.   Blood pressure is ok. Continue you medications.   Anxiety (nerves) will call in a refill for your valium.

## 2011-04-09 NOTE — Assessment & Plan Note (Signed)
BP Readings from Last 3 Encounters:  04/08/11 144/78  04/03/11 136/64  09/27/10 130/60   Adequate control of BP on present medications

## 2011-04-09 NOTE — Assessment & Plan Note (Signed)
Per Dr. Lucianne Muss - she is advised to discuss diabetic diet and weight loss with him. There is a dietician associated with his practice that can provide her diet/weight loss counseling

## 2011-04-14 ENCOUNTER — Encounter: Payer: Self-pay | Admitting: Internal Medicine

## 2011-04-15 ENCOUNTER — Other Ambulatory Visit: Payer: Self-pay | Admitting: Internal Medicine

## 2011-06-04 ENCOUNTER — Inpatient Hospital Stay (HOSPITAL_COMMUNITY)
Admission: EM | Admit: 2011-06-04 | Discharge: 2011-06-05 | DRG: 313 | Disposition: A | Discharge status: Home / Self Care | Payer: Medicare Other | Attending: Internal Medicine | Admitting: Internal Medicine

## 2011-06-04 ENCOUNTER — Emergency Department (HOSPITAL_COMMUNITY): Payer: Medicare Other

## 2011-06-04 ENCOUNTER — Inpatient Hospital Stay (HOSPITAL_COMMUNITY): Payer: Medicare Other

## 2011-06-04 ENCOUNTER — Encounter (HOSPITAL_COMMUNITY): Payer: Self-pay

## 2011-06-04 DIAGNOSIS — I509 Heart failure, unspecified: Secondary | ICD-10-CM | POA: Diagnosis present

## 2011-06-04 DIAGNOSIS — R222 Localized swelling, mass and lump, trunk: Secondary | ICD-10-CM | POA: Diagnosis present

## 2011-06-04 DIAGNOSIS — I5032 Chronic diastolic (congestive) heart failure: Secondary | ICD-10-CM | POA: Diagnosis present

## 2011-06-04 DIAGNOSIS — R0602 Shortness of breath: Secondary | ICD-10-CM

## 2011-06-04 DIAGNOSIS — I129 Hypertensive chronic kidney disease with stage 1 through stage 4 chronic kidney disease, or unspecified chronic kidney disease: Secondary | ICD-10-CM | POA: Diagnosis present

## 2011-06-04 DIAGNOSIS — Z9861 Coronary angioplasty status: Secondary | ICD-10-CM

## 2011-06-04 DIAGNOSIS — R0789 Other chest pain: Principal | ICD-10-CM | POA: Diagnosis present

## 2011-06-04 DIAGNOSIS — N189 Chronic kidney disease, unspecified: Secondary | ICD-10-CM | POA: Diagnosis present

## 2011-06-04 DIAGNOSIS — E785 Hyperlipidemia, unspecified: Secondary | ICD-10-CM | POA: Diagnosis present

## 2011-06-04 DIAGNOSIS — M109 Gout, unspecified: Secondary | ICD-10-CM | POA: Diagnosis present

## 2011-06-04 DIAGNOSIS — M1990 Unspecified osteoarthritis, unspecified site: Secondary | ICD-10-CM | POA: Diagnosis present

## 2011-06-04 DIAGNOSIS — E669 Obesity, unspecified: Secondary | ICD-10-CM | POA: Diagnosis present

## 2011-06-04 DIAGNOSIS — E119 Type 2 diabetes mellitus without complications: Secondary | ICD-10-CM | POA: Diagnosis present

## 2011-06-04 DIAGNOSIS — I517 Cardiomegaly: Secondary | ICD-10-CM

## 2011-06-04 DIAGNOSIS — K219 Gastro-esophageal reflux disease without esophagitis: Secondary | ICD-10-CM | POA: Diagnosis present

## 2011-06-04 DIAGNOSIS — Z79899 Other long term (current) drug therapy: Secondary | ICD-10-CM

## 2011-06-04 DIAGNOSIS — I2510 Atherosclerotic heart disease of native coronary artery without angina pectoris: Secondary | ICD-10-CM | POA: Diagnosis present

## 2011-06-04 DIAGNOSIS — R609 Edema, unspecified: Secondary | ICD-10-CM

## 2011-06-04 HISTORY — DX: Heart failure, unspecified: I50.9

## 2011-06-04 LAB — CARDIAC PANEL(CRET KIN+CKTOT+MB+TROPI)
Relative Index: INVALID (ref 0.0–2.5)
Relative Index: INVALID (ref 0.0–2.5)
Relative Index: INVALID (ref 0.0–2.5)
Total CK: 97 U/L (ref 7–177)
Total CK: 89 U/L (ref 7–177)
Total CK: 80 U/L (ref 7–177)
Troponin I: <0.3 ng/mL (ref <0.30)

## 2011-06-04 LAB — URINALYSIS, ROUTINE W REFLEX MICROSCOPIC
Bilirubin Urine: NEGATIVE
Glucose, UA: NEGATIVE mg/dL
Hgb urine dipstick: NEGATIVE
Protein, ur: 30 mg/dL — AB
Urobilinogen, UA: 0.2 mg/dL (ref 0.0–1.0)

## 2011-06-04 LAB — POCT I-STAT TROPONIN I: Troponin i, poc: 0.02 ng/mL (ref 0.00–0.08)

## 2011-06-04 LAB — CBC
MCHC: 33.6 g/dL (ref 30.0–36.0)
Platelets: 156 10*3/uL (ref 150–400)
RDW: 14.8 % (ref 11.5–15.5)

## 2011-06-04 LAB — COMPREHENSIVE METABOLIC PANEL
ALT: 26 U/L (ref 0–35)
AST: 31 U/L (ref 0–37)
Alkaline Phosphatase: 141 U/L — ABNORMAL HIGH (ref 39–117)
CO2: 29 mEq/L (ref 19–32)
GFR calc Af Amer: 39 mL/min — ABNORMAL LOW (ref >60)
GFR calc non Af Amer: 32 mL/min — ABNORMAL LOW (ref >60)
Glucose, Bld: 101 mg/dL — ABNORMAL HIGH (ref 70–99)
Potassium: 4.4 mEq/L (ref 3.5–5.1)
Sodium: 142 mEq/L (ref 135–145)
Total Protein: 7.8 g/dL (ref 6.0–8.3)

## 2011-06-04 LAB — DIFFERENTIAL
Basophils Absolute: 0 10*3/uL (ref 0.0–0.1)
Basophils Relative: 0 % (ref 0–1)
Eosinophils Absolute: 0.1 10*3/uL (ref 0.0–0.7)
Eosinophils Relative: 2 % (ref 0–5)
Monocytes Absolute: 0.7 10*3/uL (ref 0.1–1.0)
Neutro Abs: 5.2 10*3/uL (ref 1.7–7.7)

## 2011-06-04 LAB — PROTIME-INR
INR: 1.02 (ref 0.00–1.49)
Prothrombin Time: 13.6 seconds (ref 11.6–15.2)

## 2011-06-04 LAB — URIC ACID: Uric Acid, Serum: 4.2 mg/dL (ref 2.4–7.0)

## 2011-06-04 LAB — URINE MICROSCOPIC-ADD ON

## 2011-06-04 LAB — HEMOGLOBIN A1C: Mean Plasma Glucose: 151 mg/dL — ABNORMAL HIGH (ref <117)

## 2011-06-04 LAB — TSH: TSH: 4.464 u[IU]/mL (ref 0.350–4.500)

## 2011-06-04 LAB — GLUCOSE, CAPILLARY: Glucose-Capillary: 159 mg/dL — ABNORMAL HIGH (ref 70–99)

## 2011-06-05 ENCOUNTER — Telehealth: Payer: Self-pay | Admitting: *Deleted

## 2011-06-05 DIAGNOSIS — R079 Chest pain, unspecified: Secondary | ICD-10-CM

## 2011-06-05 DIAGNOSIS — E119 Type 2 diabetes mellitus without complications: Secondary | ICD-10-CM

## 2011-06-05 DIAGNOSIS — N390 Urinary tract infection, site not specified: Secondary | ICD-10-CM

## 2011-06-05 LAB — CBC
HCT: 35.2 % — ABNORMAL LOW (ref 36.0–46.0)
Hemoglobin: 11.2 g/dL — ABNORMAL LOW (ref 12.0–15.0)
MCH: 30.1 pg (ref 26.0–34.0)
MCV: 94.6 fL (ref 78.0–100.0)
Platelets: 157 10*3/uL (ref 150–400)
RBC: 3.72 MIL/uL — ABNORMAL LOW (ref 3.87–5.11)
WBC: 5.9 10*3/uL (ref 4.0–10.5)

## 2011-06-05 LAB — COMPREHENSIVE METABOLIC PANEL
AST: 29 U/L (ref 0–37)
BUN: 45 mg/dL — ABNORMAL HIGH (ref 6–23)
CO2: 28 mEq/L (ref 19–32)
Calcium: 9.8 mg/dL (ref 8.4–10.5)
Chloride: 107 mEq/L (ref 96–112)
Creatinine, Ser: 1.6 mg/dL — ABNORMAL HIGH (ref 0.50–1.10)
GFR calc Af Amer: 37 mL/min — ABNORMAL LOW (ref >60)
GFR calc non Af Amer: 31 mL/min — ABNORMAL LOW (ref >60)
Glucose, Bld: 88 mg/dL (ref 70–99)
Total Bilirubin: 0.5 mg/dL (ref 0.3–1.2)

## 2011-06-05 LAB — GLUCOSE, CAPILLARY: Glucose-Capillary: 122 mg/dL — ABNORMAL HIGH (ref 70–99)

## 2011-06-05 NOTE — Telephone Encounter (Signed)
Spoke with resident at pts home and informed him that pt needs to call ov. Pt is needing hosp follow up for next week

## 2011-06-06 LAB — URINE CULTURE: Culture  Setup Time: 201208220243

## 2011-06-11 NOTE — Discharge Summary (Signed)
NAME:  [name] [name]              ACCOUNT NO.:  [id number]  MEDICAL RECORD NO.:  [id number]  LOCATION:  2040                         FACILITY:  [facility]  PHYSICIAN:  [name] Gess. Crystina Borrayo, MD  DATE OF BIRTH:  Sep 27, 1928  DATE OF ADMISSION:  06/04/2011 DATE OF DISCHARGE:  06/05/2011                              DISCHARGE SUMMARY   ADMITTING DIAGNOSES: 1. Chest pain, rule out myocardial infarction. 2. Diabetes. 3. Abnormal chest x-ray. 4. Gastroesophageal reflux disease.  DISCHARGE DIAGNOSES: 1. Chest pain, rule out myocardial infarction. 2. Diabetes. 3. Abnormal chest x-ray. 4. Gastroesophageal reflux disease.  CONSULTANTS:  None.  PROCEDURES: 1. Admission chest x-ray, which revealed the patient had widening of     the hilum, worrisome for possible mass. 2. CT of the chest, August 21, read out showing possible small     bilateral hilar lymph nodes.  No large hilar mass noted.  Small     mediastinal lymph nodes, which do not meet pathologic CT size     criteria.  Bilateral lower lobe atelectasis.  HISTORY OF PRESENT ILLNESS:  The patient is an 77 year old woman, well known to the practice, followed for CAD, status post stenting, hypertension, hyperlipidemia, gout, diabetes, obesity.  She had been feeling ill for approximately a week, started to feel better, but then on the day of admission, came to the emergency department for chest discomfort.  She was found to be hypertensive, but this was relieved with nitroglycerin paste and morphine.  The patient because of her chest pain was discussed with  Cardiology, recommended medical admission.  Please see the H and P for past medical history, medications, family history, social history, and admission exam.  Although, see attached epic office notes.  HOSPITAL COURSE: 1. Cardiovascular:  The patient was admitted to a telemetry unit.  Her     chest pain was resolved in the emergency department and never     recurred.  The  patient had cardiac enzymes cycled.  Initial point-     of-care troponin was 0.09.  Subsequent troponin in the emergency     department was 0.02.  Cardiac panel #1 with a creatinine 97,     troponin less than 0.3.  Cardiac panels #2, creatinine 89, troponin     less than 0.3.  Cardiac panel #3, CK was 80, troponin less than     0.3.  On telemetry, the patient was unremarkable.  With the patient     being chest pain free with her cardiac enzymes being negative x5,     she is thought to be stable and ready for discharge.  The patient     did have 2-D echo this admission which revealed an ejection     fraction of 55% with mild LVH.  She had a grade 1 diastolic     dysfunction.  It was difficult to assess wall motion abnormalities     due to her obesity.  The patient also had lower extremity venous     Doppler exam which was negative. 2. Diabetes.  The patient was on sliding scale Hospital.  She will     resume her home medications at discharge. 3. Abnormal chest x-ray.  CT was ordered for follow-up of an abnormal     chest x-ray with results as noted above. 4. GERD.  The patient does have a history of GERD.  She will be     continued on proton pump inhibitor.  In summary, this is a very nice woman with multiple cardiac risk factors who was ruled out for MI.  She will be seen in the office in 7-10 days for follow-up.  We will consider discussing outpatient Myoview stress test for dobutamine stress test.  The patient's condition at the time of discharge dictation is stable and improved.     [name] Gess Lido Maske, MD     MEN/MEDQ  D:  06/05/2011  T:  06/05/2011  Job:  [id number]  cc:   [facility] [name], M.D.  Electronically Signed by Illene Regulus MD on 06/11/2011 05:01:17 PM

## 2011-06-18 NOTE — H&P (Signed)
NAME:  [name], [name]              ACCOUNT NO.:  [id number]  MEDICAL RECORD NO.:  [id number]  LOCATION:  2040                         FACILITY:  [facility]  PHYSICIAN:  Eduard Clos, MDDATE OF BIRTH:  1928-01-03  DATE OF ADMISSION:  06/04/2011 DATE OF DISCHARGE:                             HISTORY & PHYSICAL   PRIMARY CARE PHYSICIAN:  [name]. Norins, MD  PRIMARY CARDIOLOGIST:  Verne Carrow, MD of [facility].  CHIEF COMPLAINT:  Generalized body ache and chest pain.  HISTORY OF PRESENT ILLNESS:  An 77 year old female with known history of CAD status post stenting, history of hypertension, hyperlipidemia, gout, diabetes mellitus type 2, obesity, who has been experiencing generalized body ache for the last 1 week which the patient stated got better over the last 2-3 days, but worsened over the last 24 hours and she decided to come to the ER.  In the ER, the patient was found to have high blood pressure which improved after she got some nitroglycerin paste and morphine for pain.  The patient's cardiac enzymes have been negative. Chest x-ray shows possibility of her interstitial edema.  Cardiology on-call was called as patient is known to [facility]. At this time, as the patient's cardiac enzymes have been negative, they requested medical admission.  The patient denies any nausea, vomiting.  Denies any cough or phlegm. Denies any shortness of breath.  Denies any abdominal pain, dysuria, discharge, or diarrhea.  Denies any dizziness, loss of conscious, or any focal deficit.  The patient states the pain is mostly generalized, then it also started to become more localized to the left side of her chest which sometimes radiates to the stomach which is more like a pressure like.  This comes off and on, lasts for a few minutes.  Presently, she is pain free after she got some morphine and nitroglycerin paste.  PAST MEDICAL HISTORY:  History of CAD status  post stenting, history of chronic kidney disease, history of hypertension, history of dyslipidemia, history of chronic diastolic heart failure, last EF measured in January 2012 was 55-65%, history of GERD, history of osteoarthritis, history of gout, history of seizures secondary to DKA, history of respiratory failure requiring ventilator.  PAST SURGICAL HISTORY:  Stent placement for carotid disease and cholecystectomy.  MEDICATIONS PRIOR TO ADMISSION:  The patient is on: 1. Alphagan eye drops. 2. Vitamin D2 50,000 units. 3. Dorzolamide eye drops. 4. Lumigan eye drops. 5. Pilocarpine eye drops. 6. Colchicine 0.6 mg as needed daily p.r.n. for gout. 7. Metoprolol tartrate 50 mg 1-1/2 tablet twice daily. 8. Nitroglycerin. 9. Allopurinol 300 mg daily. 10.Furosemide 80 mg daily. 11.Lorazepam 0.5 mg twice daily. 12.Gabapentin 600 mg 3 times daily. 13.Hydralazine 100 mg 3 times daily.  ALLERGIES:  No known drug allergies.  SOCIAL HISTORY:  The patient lives with her son.  Denies smoking cigarettes, drinking alcohol, or using illegal drugs.  FAMILY HISTORY:  Nothing contributory.  REVIEW OF SYSTEMS:  As per history of presenting illness, nothing else significant.  PHYSICAL EXAMINATION:  GENERAL:  The patient was examined at the bedside, not in acute distress. VITAL SIGNS:  Blood pressure 160/69, pulse is 54 per minute, temperature 97.2, respirations 20 per  minute, O2 sat is 96%. 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  Left eye has vision.  There is no facial asymmetry.  Tongue is midline.  No discharge from ears, eyes, nose or mouth. NECK:  No neck rigidity. CHEST:  Bilateral air entry present.  No rhonchi, no crepitation. HEART:  S1 and S2 heard. ABDOMEN:  Soft, nontender.  Bowel sounds heard. CNS:  The patient is alert, awake, and oriented to time, place, and person.  Moves upper and  lower extremities. EXTREMITIES:  There is 1-2+ edema in both lower extremities.  There is no acute ischemic changes, cyanosis, or clubbing.  LABS:  EKG shows normal sinus rhythm with heart rate around 61 beats per minute with nonspecific ST-T changes.  Chest x-ray shows mildly increased interstitial markings with vascular congestion, vascular crowding, reflecting mild interstitial edema, right hilar prominence, raising concern for underlying mass.  No lymphadenopathy.  CT of the chest would be helpful for further.  CBC; WBC 7.7, hemoglobin 12.9, hematocrit 38.4, platelets 156, neutrophils 68%.  PT/INR 13.6 and 1. Urine straw colored, glucose is negative, ketones negative, nitrites negative, leukocyte esterase trace, epithelial cells few, rbc's 3-6. Basic metabolic panel; sodium 142, potassium 4.4, chloride 106, carbon dioxide 29, anion gap is 7, glucose 101, BUN 45, creatinine 1.5, calcium 10.4.  Total protein 7.8, albumin is 4, AST 31, ALT not available.  BNP is 477.4.  ASSESSMENT: 1. Chest pain. 2. Axillary hypertension. 3. Right hilar prominence per chest x-ray. 4. History of coronary artery disease status post stenting. 5. History of chronic kidney disease. 6. History of gout. 7. Diabetes mellitus type 2. 8. Hyperlipidemia. 9. Obesity.  PLAN: 1. At this time, we will admit the patient to telemetry. 2. For her chest pain, at this time the patient is chest pain free     after the patient was placed on nitroglycerin paste and has     received morphine.  We will cycle cardiac markers.  We will     continue with aspirin.  We will continue present medications     including metoprolol and hydralazine. 3. Per chest x-ray, the patient has right hilar prominence, for which     I am ordering a CT chest without contrast as the patient's     creatinine is high and the patient also has chronic kidney disease.     Further recommendations on this based on the CT chest. 4. Diabetes  mellitus type 2.  At this time, the patient states she     takes Lantus 32 units at bedtime with sliding scale coverage.  I     will be placing him on the same dose.  We need to verify her Lantus     dose and we will closely follow her CBG and hemoglobin A1c. 5. Possibility of UTI.  We will get urine cultures.  At this time, I     am going to empirically place the patient on ceftriaxone.  Further     antibiotic course based on the culture and sensitivity. 6. We are also going to add a lipase level to the labs. 7. Further recommendations based on the test order and clinical     course.     Eduard Clos, MD     ANK/MEDQ  D:  06/04/2011  T:  06/04/2011  Job:  [id number]  cc:   Verne Carrow, MD  Electronically Signed  by Midge Minium MD on 06/18/2011 09:31:05 AM

## 2011-07-03 ENCOUNTER — Other Ambulatory Visit: Payer: Self-pay | Admitting: Cardiovascular Disease

## 2011-07-10 ENCOUNTER — Other Ambulatory Visit: Payer: Self-pay | Admitting: Internal Medicine

## 2011-07-10 NOTE — Telephone Encounter (Signed)
Please Advise refills 

## 2011-07-12 MED ORDER — GABAPENTIN 600 MG PO TABS: 600.0000 mg | ORAL_TABLET | Freq: Three times a day (TID) | ORAL | Status: DC

## 2011-07-12 NOTE — Telephone Encounter (Signed)
k x 11

## 2011-07-17 LAB — GLUCOSE, CAPILLARY
Glucose-Capillary: 100 — ABNORMAL HIGH
Glucose-Capillary: 100 — ABNORMAL HIGH
Glucose-Capillary: 102 — ABNORMAL HIGH
Glucose-Capillary: 104 — ABNORMAL HIGH
Glucose-Capillary: 104 — ABNORMAL HIGH
Glucose-Capillary: 104 — ABNORMAL HIGH
Glucose-Capillary: 108 — ABNORMAL HIGH
Glucose-Capillary: 113 — ABNORMAL HIGH
Glucose-Capillary: 119 — ABNORMAL HIGH
Glucose-Capillary: 121 — ABNORMAL HIGH
Glucose-Capillary: 123 — ABNORMAL HIGH
Glucose-Capillary: 123 — ABNORMAL HIGH
Glucose-Capillary: 123 — ABNORMAL HIGH
Glucose-Capillary: 123 — ABNORMAL HIGH
Glucose-Capillary: 126 — ABNORMAL HIGH
Glucose-Capillary: 126 — ABNORMAL HIGH
Glucose-Capillary: 128 — ABNORMAL HIGH
Glucose-Capillary: 129 — ABNORMAL HIGH
Glucose-Capillary: 130 — ABNORMAL HIGH
Glucose-Capillary: 130 — ABNORMAL HIGH
Glucose-Capillary: 133 — ABNORMAL HIGH
Glucose-Capillary: 133 — ABNORMAL HIGH
Glucose-Capillary: 134 — ABNORMAL HIGH
Glucose-Capillary: 135 — ABNORMAL HIGH
Glucose-Capillary: 135 — ABNORMAL HIGH
Glucose-Capillary: 135 — ABNORMAL HIGH
Glucose-Capillary: 141 — ABNORMAL HIGH
Glucose-Capillary: 141 — ABNORMAL HIGH
Glucose-Capillary: 142 — ABNORMAL HIGH
Glucose-Capillary: 143 — ABNORMAL HIGH
Glucose-Capillary: 143 — ABNORMAL HIGH
Glucose-Capillary: 144 — ABNORMAL HIGH
Glucose-Capillary: 145 — ABNORMAL HIGH
Glucose-Capillary: 149 — ABNORMAL HIGH
Glucose-Capillary: 151 — ABNORMAL HIGH
Glucose-Capillary: 159 — ABNORMAL HIGH
Glucose-Capillary: 163 — ABNORMAL HIGH
Glucose-Capillary: 181 — ABNORMAL HIGH
Glucose-Capillary: 183 — ABNORMAL HIGH
Glucose-Capillary: 185 — ABNORMAL HIGH
Glucose-Capillary: 195 — ABNORMAL HIGH
Glucose-Capillary: 206 — ABNORMAL HIGH
Glucose-Capillary: 305 — ABNORMAL HIGH
Glucose-Capillary: 65 — ABNORMAL LOW
Glucose-Capillary: 79
Glucose-Capillary: 86
Glucose-Capillary: 92
Glucose-Capillary: 93

## 2011-07-17 LAB — BASIC METABOLIC PANEL
BUN: 13
BUN: 20
BUN: 32 — ABNORMAL HIGH
BUN: 48 — ABNORMAL HIGH
BUN: 50 — ABNORMAL HIGH
BUN: 53 — ABNORMAL HIGH
CO2: 14 — ABNORMAL LOW
CO2: 16 — ABNORMAL LOW
CO2: 16 — ABNORMAL LOW
CO2: 17 — ABNORMAL LOW
Calcium: 8.1 — ABNORMAL LOW
Calcium: 8.2 — ABNORMAL LOW
Calcium: 8.3 — ABNORMAL LOW
Calcium: 8.5
Calcium: 8.6
Calcium: 8.7
Chloride: 108
Chloride: 111
Chloride: 111
Chloride: 111
Chloride: 112
Chloride: 115 — ABNORMAL HIGH
Chloride: 120 — ABNORMAL HIGH
Creatinine, Ser: 1.76 — ABNORMAL HIGH
Creatinine, Ser: 2.26 — ABNORMAL HIGH
Creatinine, Ser: 2.31 — ABNORMAL HIGH
Creatinine, Ser: 2.33 — ABNORMAL HIGH
Creatinine, Ser: 2.42 — ABNORMAL HIGH
GFR calc Af Amer: 23 — ABNORMAL LOW
GFR calc Af Amer: 24 — ABNORMAL LOW
GFR calc Af Amer: 28 — ABNORMAL LOW
GFR calc Af Amer: 32 — ABNORMAL LOW
GFR calc Af Amer: 34 — ABNORMAL LOW
GFR calc non Af Amer: 19 — ABNORMAL LOW
GFR calc non Af Amer: 20 — ABNORMAL LOW
GFR calc non Af Amer: 20 — ABNORMAL LOW
GFR calc non Af Amer: 23 — ABNORMAL LOW
GFR calc non Af Amer: 26 — ABNORMAL LOW
GFR calc non Af Amer: 28 — ABNORMAL LOW
Glucose, Bld: 107 — ABNORMAL HIGH
Glucose, Bld: 131 — ABNORMAL HIGH
Glucose, Bld: 132 — ABNORMAL HIGH
Glucose, Bld: 142 — ABNORMAL HIGH
Glucose, Bld: 166 — ABNORMAL HIGH
Glucose, Bld: 79
Potassium: 3.4 — ABNORMAL LOW
Potassium: 3.5
Potassium: 3.6
Potassium: 3.9
Sodium: 135
Sodium: 138
Sodium: 138
Sodium: 139
Sodium: 139
Sodium: 139
Sodium: 141
Sodium: 142

## 2011-07-17 LAB — RENAL FUNCTION PANEL
Albumin: 2.1 — ABNORMAL LOW
CO2: 17 — ABNORMAL LOW
Calcium: 8.2 — ABNORMAL LOW
Chloride: 113 — ABNORMAL HIGH
Chloride: 114 — ABNORMAL HIGH
Creatinine, Ser: 2.47 — ABNORMAL HIGH
GFR calc Af Amer: 23 — ABNORMAL LOW
GFR calc Af Amer: 26 — ABNORMAL LOW
GFR calc non Af Amer: 19 — ABNORMAL LOW
GFR calc non Af Amer: 21 — ABNORMAL LOW
Glucose, Bld: 132 — ABNORMAL HIGH
Potassium: 3.9
Sodium: 138

## 2011-07-17 LAB — CBC
HCT: 28.2 — ABNORMAL LOW
HCT: 29.4 — ABNORMAL LOW
HCT: 34.8 — ABNORMAL LOW
Hemoglobin: 10.5 — ABNORMAL LOW
Hemoglobin: 11.4 — ABNORMAL LOW
Hemoglobin: 9.2 — ABNORMAL LOW
Hemoglobin: 9.5 — ABNORMAL LOW
Hemoglobin: 9.5 — ABNORMAL LOW
MCHC: 31.6
MCHC: 32.6
MCV: 80
Platelets: 131 — ABNORMAL LOW
Platelets: 161
Platelets: 184
RBC: 3.59 — ABNORMAL LOW
RBC: 3.92
RBC: 3.98
RBC: 4.34
RDW: 23.6 — ABNORMAL HIGH
RDW: 23.6 — ABNORMAL HIGH
RDW: 24.2 — ABNORMAL HIGH
WBC: 10.2
WBC: 11.2 — ABNORMAL HIGH
WBC: 12.3 — ABNORMAL HIGH
WBC: 12.7 — ABNORMAL HIGH

## 2011-07-17 LAB — RETICULOCYTES
RBC.: 4.22
Retic Ct Pct: 1.5

## 2011-07-17 LAB — POCT I-STAT 3, ART BLOOD GAS (G3+)
Acid-base deficit: 8 — ABNORMAL HIGH
Bicarbonate: 16.7 — ABNORMAL LOW
O2 Saturation: 100
Patient temperature: 99.1
TCO2: 18
pCO2 arterial: 27.8 — ABNORMAL LOW
pO2, Arterial: 170 — ABNORMAL HIGH

## 2011-07-17 LAB — PHOSPHORUS
Phosphorus: 2.3
Phosphorus: 3.6

## 2011-07-17 LAB — CARDIAC PANEL(CRET KIN+CKTOT+MB+TROPI)
CK, MB: 8 — ABNORMAL HIGH
Relative Index: INVALID
Relative Index: INVALID
Total CK: 54
Total CK: 98
Troponin I: 0.84
Troponin I: 1.14

## 2011-07-17 LAB — IRON AND TIBC
Iron: 42
TIBC: 208 — ABNORMAL LOW

## 2011-07-17 LAB — MAGNESIUM: Magnesium: 2.1

## 2011-07-17 LAB — VITAMIN B12: Vitamin B-12: 796 (ref 211–911)

## 2011-07-17 LAB — HEMOGLOBIN A1C: Mean Plasma Glucose: 341

## 2011-10-10 ENCOUNTER — Other Ambulatory Visit: Payer: Self-pay | Admitting: Cardiovascular Disease

## 2011-11-11 ENCOUNTER — Other Ambulatory Visit: Payer: Self-pay | Admitting: Internal Medicine

## 2011-11-16 ENCOUNTER — Other Ambulatory Visit: Payer: Self-pay | Admitting: Internal Medicine

## 2012-01-16 ENCOUNTER — Other Ambulatory Visit: Payer: Self-pay | Admitting: Cardiovascular Disease

## 2012-02-07 ENCOUNTER — Other Ambulatory Visit: Payer: Self-pay | Admitting: Internal Medicine

## 2012-04-30 ENCOUNTER — Other Ambulatory Visit: Payer: Self-pay | Admitting: Internal Medicine

## 2012-06-10 ENCOUNTER — Other Ambulatory Visit: Payer: Self-pay | Admitting: Cardiovascular Disease

## 2012-07-22 ENCOUNTER — Other Ambulatory Visit: Payer: Self-pay | Admitting: Internal Medicine

## 2012-09-28 ENCOUNTER — Other Ambulatory Visit: Payer: Self-pay | Admitting: Cardiovascular Disease

## 2012-10-13 ENCOUNTER — Other Ambulatory Visit: Payer: Self-pay | Admitting: Internal Medicine

## 2012-11-03 ENCOUNTER — Telehealth: Payer: Self-pay | Admitting: Cardiovascular Disease

## 2012-11-03 NOTE — Telephone Encounter (Signed)
Left message to call back  

## 2012-11-03 NOTE — Telephone Encounter (Signed)
[facility] calling with questions re pt being in the heart care program

## 2012-11-11 NOTE — Telephone Encounter (Signed)
Spoke with nurse at [facility] who report pt is being seen in their Heart Failure Program.  I told her pt has not been seen in office since June 2012. Year follow up was planned at that time.  I told her pt did not have diagnosis of CHF at last office visit. [employer] will follow up with pt and ask her to contact our office for appt.

## 2012-12-06 ENCOUNTER — Other Ambulatory Visit: Payer: Self-pay | Admitting: Cardiovascular Disease

## 2012-12-21 ENCOUNTER — Other Ambulatory Visit: Payer: Self-pay | Admitting: Internal Medicine

## 2012-12-30 ENCOUNTER — Encounter (HOSPITAL_COMMUNITY): Payer: Self-pay | Admitting: Emergency Medicine

## 2012-12-30 ENCOUNTER — Telehealth: Payer: Self-pay | Admitting: Cardiovascular Disease

## 2012-12-30 ENCOUNTER — Emergency Department (HOSPITAL_COMMUNITY): Payer: Medicare Other

## 2012-12-30 ENCOUNTER — Inpatient Hospital Stay (HOSPITAL_COMMUNITY)
Admission: EM | Admit: 2012-12-30 | Discharge: 2013-01-13 | DRG: 689 | Disposition: A | Discharge status: Skilled Nursing Facility | Payer: Medicare Other | Attending: Internal Medicine | Admitting: Internal Medicine

## 2012-12-30 DIAGNOSIS — R109 Unspecified abdominal pain: Secondary | ICD-10-CM | POA: Diagnosis present

## 2012-12-30 DIAGNOSIS — N179 Acute kidney failure, unspecified: Secondary | ICD-10-CM | POA: Diagnosis present

## 2012-12-30 DIAGNOSIS — I120 Hypertensive chronic kidney disease with stage 5 chronic kidney disease or end stage renal disease: Secondary | ICD-10-CM | POA: Diagnosis present

## 2012-12-30 DIAGNOSIS — J810 Acute pulmonary edema: Secondary | ICD-10-CM

## 2012-12-30 DIAGNOSIS — IMO0001 Reserved for inherently not codable concepts without codable children: Secondary | ICD-10-CM | POA: Diagnosis present

## 2012-12-30 DIAGNOSIS — G9340 Encephalopathy, unspecified: Secondary | ICD-10-CM | POA: Diagnosis present

## 2012-12-30 DIAGNOSIS — K219 Gastro-esophageal reflux disease without esophagitis: Secondary | ICD-10-CM | POA: Diagnosis present

## 2012-12-30 DIAGNOSIS — N182 Chronic kidney disease, stage 2 (mild): Secondary | ICD-10-CM

## 2012-12-30 DIAGNOSIS — M109 Gout, unspecified: Secondary | ICD-10-CM

## 2012-12-30 DIAGNOSIS — N390 Urinary tract infection, site not specified: Secondary | ICD-10-CM

## 2012-12-30 DIAGNOSIS — N186 End stage renal disease: Secondary | ICD-10-CM | POA: Diagnosis present

## 2012-12-30 DIAGNOSIS — Z79899 Other long term (current) drug therapy: Secondary | ICD-10-CM

## 2012-12-30 DIAGNOSIS — Z794 Long term (current) use of insulin: Secondary | ICD-10-CM

## 2012-12-30 DIAGNOSIS — I509 Heart failure, unspecified: Secondary | ICD-10-CM | POA: Diagnosis present

## 2012-12-30 DIAGNOSIS — R197 Diarrhea, unspecified: Secondary | ICD-10-CM

## 2012-12-30 DIAGNOSIS — E109 Type 1 diabetes mellitus without complications: Secondary | ICD-10-CM

## 2012-12-30 DIAGNOSIS — I701 Atherosclerosis of renal artery: Secondary | ICD-10-CM | POA: Diagnosis present

## 2012-12-30 DIAGNOSIS — D649 Anemia, unspecified: Secondary | ICD-10-CM

## 2012-12-30 DIAGNOSIS — H409 Unspecified glaucoma: Secondary | ICD-10-CM | POA: Diagnosis present

## 2012-12-30 DIAGNOSIS — E785 Hyperlipidemia, unspecified: Secondary | ICD-10-CM | POA: Diagnosis present

## 2012-12-30 DIAGNOSIS — M549 Dorsalgia, unspecified: Secondary | ICD-10-CM

## 2012-12-30 DIAGNOSIS — E119 Type 2 diabetes mellitus without complications: Secondary | ICD-10-CM | POA: Diagnosis present

## 2012-12-30 DIAGNOSIS — Z992 Dependence on renal dialysis: Secondary | ICD-10-CM

## 2012-12-30 DIAGNOSIS — I10 Essential (primary) hypertension: Secondary | ICD-10-CM | POA: Diagnosis present

## 2012-12-30 DIAGNOSIS — E876 Hypokalemia: Secondary | ICD-10-CM | POA: Diagnosis present

## 2012-12-30 DIAGNOSIS — I2510 Atherosclerotic heart disease of native coronary artery without angina pectoris: Secondary | ICD-10-CM | POA: Diagnosis present

## 2012-12-30 LAB — COMPREHENSIVE METABOLIC PANEL
ALT: 16 U/L (ref 0–35)
AST: 28 U/L (ref 0–37)
Albumin: 3.9 g/dL (ref 3.5–5.2)
Alkaline Phosphatase: 123 U/L — ABNORMAL HIGH (ref 39–117)
BUN: 34 mg/dL — ABNORMAL HIGH (ref 6–23)
CO2: 20 mEq/L (ref 19–32)
Calcium: 10 mg/dL (ref 8.4–10.5)
Chloride: 102 mEq/L (ref 96–112)
Creatinine, Ser: 1.77 mg/dL — ABNORMAL HIGH (ref 0.50–1.10)
GFR calc Af Amer: 30 mL/min — ABNORMAL LOW (ref >90)
GFR calc non Af Amer: 26 mL/min — ABNORMAL LOW (ref >90)
Glucose, Bld: 185 mg/dL — ABNORMAL HIGH (ref 70–99)
Potassium: 2.7 mEq/L — CL (ref 3.5–5.1)
Sodium: 141 mEq/L (ref 135–145)
Total Bilirubin: 0.5 mg/dL (ref 0.3–1.2)
Total Protein: 7.3 g/dL (ref 6.0–8.3)

## 2012-12-30 LAB — CG4 I-STAT (LACTIC ACID): Lactic Acid, Venous: 1.16 mmol/L (ref 0.5–2.2)

## 2012-12-30 LAB — CBC
HCT: 39.8 % (ref 36.0–46.0)
Hemoglobin: 13.3 g/dL (ref 12.0–15.0)
MCH: 27.5 pg (ref 26.0–34.0)
MCHC: 33.4 g/dL (ref 30.0–36.0)
MCV: 82.2 fL (ref 78.0–100.0)
Platelets: 145 10*3/uL — ABNORMAL LOW (ref 150–400)
RBC: 4.84 MIL/uL (ref 3.87–5.11)
RDW: 19.9 % — ABNORMAL HIGH (ref 11.5–15.5)
WBC: 5.8 10*3/uL (ref 4.0–10.5)

## 2012-12-30 LAB — URINALYSIS, ROUTINE W REFLEX MICROSCOPIC
Glucose, UA: NEGATIVE mg/dL
Hgb urine dipstick: NEGATIVE
Ketones, ur: 15 mg/dL — AB
Nitrite: NEGATIVE
Protein, ur: 30 mg/dL — AB
Specific Gravity, Urine: 1.017 (ref 1.005–1.030)
Urobilinogen, UA: 0.2 mg/dL (ref 0.0–1.0)
pH: 5 (ref 5.0–8.0)

## 2012-12-30 LAB — URINE MICROSCOPIC-ADD ON

## 2012-12-30 LAB — LIPASE, BLOOD: Lipase: 143 U/L — ABNORMAL HIGH (ref 11–59)

## 2012-12-30 MED ORDER — ONDANSETRON HCL 4 MG/2ML IJ SOLN
4.0000 mg | Freq: Once | INTRAMUSCULAR | Status: AC
  Administered 2012-12-30: 4 mg via INTRAVENOUS
  Filled 2012-12-30: qty 2

## 2012-12-30 MED ORDER — POTASSIUM CHLORIDE 10 MEQ/100ML IV SOLN
10.0000 meq | Freq: Once | INTRAVENOUS | Status: AC
  Administered 2012-12-30: 10 meq via INTRAVENOUS
  Filled 2012-12-30: qty 100

## 2012-12-30 MED ORDER — DEXTROSE 5 % IV SOLN
1.0000 g | Freq: Once | INTRAVENOUS | Status: AC
  Administered 2012-12-31: 01:00:00 via INTRAVENOUS
  Filled 2012-12-30: qty 10

## 2012-12-30 MED ORDER — ONDANSETRON HCL 4 MG/2ML IJ SOLN
INTRAMUSCULAR | Status: AC
  Filled 2012-12-30: qty 2

## 2012-12-30 MED ORDER — POTASSIUM CHLORIDE CRYS ER 20 MEQ PO TBCR
60.0000 meq | EXTENDED_RELEASE_TABLET | Freq: Once | ORAL | Status: AC
  Administered 2012-12-30: 60 meq via ORAL
  Filled 2012-12-30: qty 3

## 2012-12-30 MED ORDER — SODIUM CHLORIDE 0.9 % IV BOLUS (SEPSIS)
500.0000 mL | Freq: Once | INTRAVENOUS | Status: AC
  Administered 2012-12-30: 500 mL via INTRAVENOUS

## 2012-12-30 MED ORDER — IOHEXOL 300 MG/ML  SOLN
50.0000 mL | Freq: Once | INTRAMUSCULAR | Status: AC | PRN
  Administered 2012-12-30: 50 mL via ORAL

## 2012-12-30 MED ORDER — HYDROMORPHONE HCL PF 1 MG/ML IJ SOLN
1.0000 mg | Freq: Once | INTRAMUSCULAR | Status: AC
  Administered 2012-12-30: 1 mg via INTRAVENOUS
  Filled 2012-12-30: qty 1

## 2012-12-30 MED ORDER — ONDANSETRON HCL 4 MG/2ML IJ SOLN
4.0000 mg | Freq: Once | INTRAMUSCULAR | Status: AC
  Administered 2012-12-30: 4 mg via INTRAVENOUS

## 2012-12-30 NOTE — Telephone Encounter (Signed)
Spoke with Elson Clan at [facility] who is asking if we received prescription for Dr. Clifton James to sign for heart failure program provided by [facility].  I told her we had not received this. I gave her information from phone call dated 11/03/2012 from [facility].

## 2012-12-30 NOTE — ED Provider Notes (Signed)
History    79 year old female with abdominal pain. Gradual onset about a week ago. Progressively worsening. Pain is in the left lower quadrant. Initially intermittent, but now more or less constant. Achy to sharp in character. No appreciable exacerbating/relieving factors. Associated with nausea, vomiting and diarrhea since last night. Nonbilious, nonbloody emesis. No blood in her stool. No melena. No fevers or chills. Feels very weak. No urinary complaints. No sick contacts. No intervention prior to arrival.  CSN: [id number]  Arrival date & time 12/30/12  1914   First MD Initiated Contact with Patient 12/30/12 1924      Chief Complaint  Patient presents with  . Abdominal Pain    (Consider location/radiation/quality/duration/timing/severity/associated sxs/prior treatment) HPI  Past Medical History  Diagnosis Date  . Coronary artery disease   . Renal artery stenosis   . Hypertension   . Dyslipidemia   . GERD (gastroesophageal reflux disease)   . Tension headache   . Abdominal pain   . Anemia, unspecified   . Gout   . Back pain, chronic   . History of tonsillectomy   . History of cholecystectomy   . Depression   . IDDM (insulin dependent diabetes mellitus)   . Diabetes mellitus   . CHF (congestive heart failure)     Past Surgical History  Procedure Laterality Date  . Tonsillectomy    . Cholecystectomy      Family History  Problem Relation Age of Onset  . Coronary artery disease Father   . Hypertension Father   . Heart attack Father   . Hyperlipidemia Father   . Diabetes Sister   . Hypertension Sister   . Hyperlipidemia Sister   . Hypertension Brother   . Diabetes Brother   . Hyperlipidemia Brother   . Cancer Neg Hx     Breast and colon    History  Substance Use Topics  . Smoking status: Never Smoker   . Smokeless tobacco: Not on file  . Alcohol Use: No    OB History   Grav Para Term Preterm Abortions TAB SAB Ect Mult Living                   Review of Systems  All systems reviewed and negative, other than as noted in HPI.   Allergies  Review of patient's allergies indicates no known allergies.  Home Medications   Current Outpatient Rx  Name  Route  Sig  Dispense  Refill  . allopurinol (ZYLOPRIM) 300 MG tablet   Oral   Take 300 mg by mouth daily.           Marland Kitchen amLODipine (NORVASC) 5 MG tablet   Oral   Take 5 mg by mouth daily.         Marland Kitchen aspirin 81 MG tablet   Oral   Take 81 mg by mouth daily.           . furosemide (LASIX) 80 MG tablet      TAKE 1 TABLET EVERY MORNING   30 tablet   10   . hydrALAZINE (APRESOLINE) 100 MG tablet      TAKE 1 TABLET BY MOUTH 3 TIMES A DAY   90 tablet   0   . insulin glargine (LANTUS) 100 UNIT/ML injection   Subcutaneous   Inject 36 Units into the skin at bedtime.           . insulin lispro (HUMALOG) 100 UNIT/ML injection   Subcutaneous   Inject into the skin. Take  as directed.         . metoprolol tartrate (LOPRESSOR) 25 MG tablet      TAKE 3 TABLETS 2 TIMES A DAY   180 tablet   1   . nitroGLYCERIN (NITROSTAT) 0.4 MG SL tablet   Sublingual   Place 0.4 mg under the tongue as needed. For chest pain.          . pantoprazole (PROTONIX) 40 MG tablet      TAKE 1 TABLET BY MOUTH EVERY MORNING   30 tablet   1     PT NEEDS TO SCHEDULE PHYSICAL EXAM FOR FURTHER REF ...   . pravastatin (PRAVACHOL) 80 MG tablet   Oral   Take 80 mg by mouth at bedtime.           . bimatoprost (LUMIGAN) 0.03 % ophthalmic solution      Take as directed.          Marland Kitchen CALCITRIOL PO   Oral   Take by mouth daily.           . citalopram (CELEXA) 20 MG tablet   Oral   Take 20 mg by mouth daily.           . colchicine 0.6 MG tablet   Oral   Take 0.6-1.2 mg by mouth daily. Takes 1-2 tablets daily as needed for gout flareup.         . diazepam (VALIUM) 5 MG tablet   Oral   Take 1 tablet (5 mg total) by mouth every 6 (six) hours as needed.   30 tablet   5    . ergocalciferol (VITAMIN D2) 50000 UNITS capsule   Oral   Take 50,000 Units by mouth once a week.           . gabapentin (NEURONTIN) 600 MG tablet      TAKE 1 TABLET 3 TIMES A DAY   90 tablet   2   . gabapentin (NEURONTIN) 600 MG tablet   Oral   Take 1 tablet (600 mg total) by mouth 3 (three) times daily.   90 tablet   2   . gabapentin (NEURONTIN) 600 MG tablet      TAKE 1 TABLET 3 TIMES A DAY   90 tablet   2     BP 141/79  Pulse 58  Temp(Src) 98.6 F (37 C) (Oral)  Resp 15  SpO2 96%  Physical Exam  Nursing note and vitals reviewed. Constitutional:  Sitting in bed. Mildly uncomfortable appearing. Obese.  HENT:  Head: Normocephalic and atraumatic.  Eyes: Conjunctivae are normal. Pupils are equal, round, and reactive to light. Right eye exhibits no discharge. Left eye exhibits no discharge.  Neck: Neck supple.  Cardiovascular: Normal rate, regular rhythm and normal heart sounds.  Exam reveals no gallop and no friction rub.   No murmur heard. Pulmonary/Chest: Effort normal and breath sounds normal. No respiratory distress.  Abdominal: Soft. She exhibits no distension and no mass. There is tenderness. There is no rebound.  Left lower quadrant pain without rebound or guarding. No distention.  Genitourinary:  No costovertebral angle tenderness  Musculoskeletal: She exhibits no edema and no tenderness.  Neurological: She is alert.  Skin: Skin is warm and dry.  Psychiatric: She has a normal mood and affect. Her behavior is normal. Thought content normal.    ED Course  Procedures (including critical care time)  Angiocath insertion Performed by: Raeford Razor  Consent: Verbal consent obtained. Risks and benefits:  risks, benefits and alternatives were discussed Time out: Immediately prior to procedure a "time out" was called to verify the correct patient, procedure, equipment, support staff and site/side marked as required.  Preparation: Patient was prepped  and draped in the usual sterile fashion.  Vein Location: L EJ  Gauge: 20g  Positional, but blood return and flush without difficulty  Patient tolerance: Patient tolerated the procedure well with no immediate complications.     Labs Reviewed  CBC - Abnormal; Notable for the following:    RDW 19.9 (*)    Platelets 145 (*)    All other components within normal limits  URINALYSIS, ROUTINE W REFLEX MICROSCOPIC - Abnormal; Notable for the following:    APPearance HAZY (*)    Bilirubin Urine SMALL (*)    Ketones, ur 15 (*)    Protein, ur 30 (*)    Leukocytes, UA LARGE (*)    All other components within normal limits  COMPREHENSIVE METABOLIC PANEL - Abnormal; Notable for the following:    Potassium 2.7 (*)    Glucose, Bld 185 (*)    BUN 34 (*)    Creatinine, Ser 1.77 (*)    Alkaline Phosphatase 123 (*)    GFR calc non Af Amer 26 (*)    GFR calc Af Amer 30 (*)    All other components within normal limits  LIPASE, BLOOD - Abnormal; Notable for the following:    Lipase 143 (*)    All other components within normal limits  URINE MICROSCOPIC-ADD ON - Abnormal; Notable for the following:    Squamous Epithelial / LPF MANY (*)    Bacteria, UA MANY (*)    Casts HYALINE CASTS (*)    All other components within normal limits  URINE CULTURE  CG4 I-STAT (LACTIC ACID)   Ct Abdomen Pelvis Wo Contrast  12/31/2012  *RADIOLOGY REPORT*  Clinical Data: 1-week history of abdominal pain.  1-day history of nausea/vomiting and diarrhea.  Surgical history includes cholecystectomy.  CT ABDOMEN AND PELVIS WITHOUT CONTRAST  Technique:  Multidetector CT imaging of the abdomen and pelvis was performed following the standard protocol without intravenous contrast.  Oral contrast was administered.  Comparison: CT abdomen pelvis 09/11/2005 [facility].  Findings: Small hiatal hernia, slightly increased in size since the prior examination.  Stomach otherwise normal in appearance.  Normal- appearing small  bowel.  Colon relatively decompressed, containing liquid stool, consistent with given history of diarrhea.  Diffuse colonic diverticulosis without evidence of acute diverticulitis. Appendix not clearly visualized, but no pericecal inflammation.  No ascites.  Normal unenhanced appearance of the liver, spleen, and adrenal glands.  Mild pancreatic atrophy without focal parenchymal abnormality.  No biliary ductal dilation.  Mild scarring involving both kidneys, without evidence of focal parenchymal abnormality, allowing for the unenhanced technique.  Moderate to severe aorto- iliofemoral atherosclerosis without aneurysm.  No significant lymphadenopathy.  Uterus atrophic consistent with age, containing a calcified, degenerated fundal fibroid.  No adnexal masses or free pelvic fluid.  Urinary bladder unremarkable.  Numerous pelvic phleboliths.  Bone window images demonstrate degenerative changes throughout the lower thoracic and lumbar spine, generalized osseous demineralization, and mild degenerative changes involving the sacroiliac joints.  Scar and atelectasis involving the visualized lung bases.  Heart enlarged.  IMPRESSION:  1.  No acute abnormalities involving the abdomen or pelvis. 2.  Small hiatal hernia. 3.  Diffuse colonic diverticulosis without evidence of acute diverticulitis. 4.  Stable mild scarring involving both kidneys. 5.  Stable pancreatic atrophy. 6.  Atrophic uterus containing a calcified, degenerated fundal fibroid.   Original Report Authenticated By: Hulan Saas, M.D.      1. Abdominal pain   2. Hypokalemia   3. UTI (urinary tract infection)   4. Nausea vomiting and diarrhea       MDM  79 year old female with abdominal pain. CT abdomen pelvis does not show any acute abnormality. UA is not an ideal specimen with multiple squamous cells, but is somewhat suggestive of UTI. Given patient's abdominal pain will treat. Nausea, vomiting diarrhea maybe viral illness. Patient treated  symptomatically. Hypokalemia. This was repleted. Discussed with hospitalist for admission.        Raeford Razor, MD 12/31/12 [phone]

## 2012-12-30 NOTE — Telephone Encounter (Signed)
Plz return call to [facility] Nurse  [phone] ext [phone] regarding documentation

## 2012-12-30 NOTE — ED Notes (Signed)
EMS called out for abdominal pain that had been going on for a week. Became increasingly worse over the last week. N/V/D started last night. Denies chills and fever.

## 2012-12-31 ENCOUNTER — Observation Stay (HOSPITAL_COMMUNITY): Payer: Medicare Other

## 2012-12-31 DIAGNOSIS — I10 Essential (primary) hypertension: Secondary | ICD-10-CM

## 2012-12-31 DIAGNOSIS — E109 Type 1 diabetes mellitus without complications: Secondary | ICD-10-CM

## 2012-12-31 DIAGNOSIS — N182 Chronic kidney disease, stage 2 (mild): Secondary | ICD-10-CM

## 2012-12-31 DIAGNOSIS — N390 Urinary tract infection, site not specified: Principal | ICD-10-CM

## 2012-12-31 DIAGNOSIS — E876 Hypokalemia: Secondary | ICD-10-CM

## 2012-12-31 DIAGNOSIS — R109 Unspecified abdominal pain: Secondary | ICD-10-CM

## 2012-12-31 DIAGNOSIS — H409 Unspecified glaucoma: Secondary | ICD-10-CM

## 2012-12-31 LAB — BASIC METABOLIC PANEL
BUN: 36 mg/dL — ABNORMAL HIGH (ref 6–23)
CO2: 24 mEq/L (ref 19–32)
Calcium: 9.7 mg/dL (ref 8.4–10.5)
GFR calc non Af Amer: 23 mL/min — ABNORMAL LOW (ref >90)
Glucose, Bld: 192 mg/dL — ABNORMAL HIGH (ref 70–99)
Potassium: 3.5 mEq/L (ref 3.5–5.1)
Sodium: 141 mEq/L (ref 135–145)

## 2012-12-31 LAB — CBC
HCT: 37.8 % (ref 36.0–46.0)
Hemoglobin: 12.3 g/dL (ref 12.0–15.0)
MCH: 27 pg (ref 26.0–34.0)
MCHC: 32.5 g/dL (ref 30.0–36.0)
RBC: 4.55 MIL/uL (ref 3.87–5.11)

## 2012-12-31 LAB — GLUCOSE, CAPILLARY
Glucose-Capillary: 161 mg/dL — ABNORMAL HIGH (ref 70–99)
Glucose-Capillary: 165 mg/dL — ABNORMAL HIGH (ref 70–99)
Glucose-Capillary: 196 mg/dL — ABNORMAL HIGH (ref 70–99)
Glucose-Capillary: 205 mg/dL — ABNORMAL HIGH (ref 70–99)

## 2012-12-31 LAB — HEMOGLOBIN A1C: Mean Plasma Glucose: 154 mg/dL — ABNORMAL HIGH (ref <117)

## 2012-12-31 MED ORDER — POLYVINYL ALCOHOL 1.4 % OP SOLN
1.0000 [drp] | Freq: Two times a day (BID) | OPHTHALMIC | Status: DC
  Administered 2012-12-31 – 2013-01-13 (×28): 1 [drp] via OPHTHALMIC
  Filled 2012-12-31: qty 15

## 2012-12-31 MED ORDER — ATORVASTATIN CALCIUM 20 MG PO TABS
20.0000 mg | ORAL_TABLET | Freq: Every day | ORAL | Status: DC
  Administered 2012-12-31 – 2013-01-12 (×9): 20 mg via ORAL
  Filled 2012-12-31 (×14): qty 1

## 2012-12-31 MED ORDER — DORZOLAMIDE HCL-TIMOLOL MAL 2-0.5 % OP SOLN: 1.0000 [drp] | Freq: Two times a day (BID) | OPHTHALMIC | Status: DC

## 2012-12-31 MED ORDER — DIAZEPAM 5 MG PO TABS: 5.0000 mg | ORAL_TABLET | Freq: Four times a day (QID) | ORAL | Status: DC | PRN

## 2012-12-31 MED ORDER — COLCHICINE 0.6 MG PO TABS
0.6000 mg | ORAL_TABLET | Freq: Two times a day (BID) | ORAL | Status: DC | PRN
  Filled 2012-12-31: qty 1

## 2012-12-31 MED ORDER — CITALOPRAM HYDROBROMIDE 20 MG PO TABS
20.0000 mg | ORAL_TABLET | Freq: Every day | ORAL | Status: DC
  Administered 2012-12-31 – 2013-01-13 (×10): 20 mg via ORAL
  Filled 2012-12-31 (×14): qty 1

## 2012-12-31 MED ORDER — METOPROLOL TARTRATE 50 MG PO TABS
75.0000 mg | ORAL_TABLET | Freq: Two times a day (BID) | ORAL | Status: DC
  Administered 2012-12-31 (×2): 75 mg via ORAL
  Filled 2012-12-31 (×5): qty 1

## 2012-12-31 MED ORDER — INSULIN GLARGINE 100 UNIT/ML ~~LOC~~ SOLN
6.0000 [IU] | Freq: Every day | SUBCUTANEOUS | Status: DC
  Administered 2012-12-31 – 2013-01-12 (×12): 6 [IU] via SUBCUTANEOUS
  Filled 2012-12-31 (×15): qty 0.06

## 2012-12-31 MED ORDER — ALLOPURINOL 300 MG PO TABS
300.0000 mg | ORAL_TABLET | Freq: Every day | ORAL | Status: DC
  Administered 2012-12-31 – 2013-01-03 (×4): 300 mg via ORAL
  Filled 2012-12-31 (×4): qty 1

## 2012-12-31 MED ORDER — TRAMADOL HCL 50 MG PO TABS
50.0000 mg | ORAL_TABLET | Freq: Four times a day (QID) | ORAL | Status: DC
  Administered 2012-12-31 – 2013-01-03 (×8): 50 mg via ORAL
  Filled 2012-12-31 (×21): qty 1

## 2012-12-31 MED ORDER — DIFLUPREDNATE 0.05 % OP EMUL
1.0000 [drp] | Freq: Four times a day (QID) | OPHTHALMIC | Status: DC
  Administered 2012-12-31 – 2013-01-04 (×19): 1 [drp] via OPHTHALMIC
  Filled 2012-12-31: qty 0.1

## 2012-12-31 MED ORDER — GABAPENTIN 600 MG PO TABS
600.0000 mg | ORAL_TABLET | Freq: Three times a day (TID) | ORAL | Status: DC
  Administered 2012-12-31 – 2013-01-03 (×10): 600 mg via ORAL
  Filled 2012-12-31 (×12): qty 1

## 2012-12-31 MED ORDER — ATROPINE SULFATE 1 % OP SOLN
1.0000 [drp] | Freq: Two times a day (BID) | OPHTHALMIC | Status: DC
  Administered 2012-12-31 – 2013-01-13 (×28): 1 [drp] via OPHTHALMIC
  Filled 2012-12-31: qty 2

## 2012-12-31 MED ORDER — AMLODIPINE BESYLATE 5 MG PO TABS
5.0000 mg | ORAL_TABLET | Freq: Every day | ORAL | Status: DC
  Administered 2012-12-31 – 2013-01-03 (×4): 5 mg via ORAL
  Filled 2012-12-31 (×4): qty 1

## 2012-12-31 MED ORDER — ASPIRIN EC 81 MG PO TBEC
81.0000 mg | DELAYED_RELEASE_TABLET | Freq: Every day | ORAL | Status: DC
  Administered 2012-12-31 – 2013-01-13 (×9): 81 mg via ORAL
  Filled 2012-12-31 (×15): qty 1

## 2012-12-31 MED ORDER — SIMVASTATIN 40 MG PO TABS
40.0000 mg | ORAL_TABLET | Freq: Every day | ORAL | Status: DC
  Filled 2012-12-31: qty 1

## 2012-12-31 MED ORDER — CEFUROXIME AXETIL 250 MG PO TABS
250.0000 mg | ORAL_TABLET | Freq: Two times a day (BID) | ORAL | Status: DC
  Administered 2012-12-31 – 2013-01-03 (×7): 250 mg via ORAL
  Filled 2012-12-31 (×10): qty 1

## 2012-12-31 MED ORDER — HEPARIN SODIUM (PORCINE) 5000 UNIT/ML IJ SOLN
5000.0000 [IU] | Freq: Three times a day (TID) | INTRAMUSCULAR | Status: DC
  Administered 2012-12-31 – 2013-01-05 (×16): 5000 [IU] via SUBCUTANEOUS
  Filled 2012-12-31 (×19): qty 1

## 2012-12-31 MED ORDER — POTASSIUM CHLORIDE 10 MEQ/100ML IV SOLN
10.0000 meq | INTRAVENOUS | Status: AC
Start: 2012-12-31 — End: 2012-12-31
  Administered 2012-12-31 (×4): 10 meq via INTRAVENOUS
  Filled 2012-12-31 (×4): qty 100

## 2012-12-31 MED ORDER — DORZOLAMIDE HCL-TIMOLOL MAL 2-0.5 % OP SOLN
1.0000 [drp] | Freq: Two times a day (BID) | OPHTHALMIC | Status: DC
  Administered 2012-12-31 – 2013-01-13 (×28): 1 [drp] via OPHTHALMIC
  Filled 2012-12-31: qty 10

## 2012-12-31 MED ORDER — INSULIN ASPART 100 UNIT/ML ~~LOC~~ SOLN
0.0000 [IU] | SUBCUTANEOUS | Status: DC
  Administered 2012-12-31: 1 [IU] via SUBCUTANEOUS
  Administered 2012-12-31 (×4): 2 [IU] via SUBCUTANEOUS
  Administered 2013-01-01: 5 [IU] via SUBCUTANEOUS
  Administered 2013-01-01: 2 [IU] via SUBCUTANEOUS
  Administered 2013-01-01 (×2): 3 [IU] via SUBCUTANEOUS
  Administered 2013-01-02 – 2013-01-03 (×11): 2 [IU] via SUBCUTANEOUS
  Administered 2013-01-03: 3 [IU] via SUBCUTANEOUS
  Administered 2013-01-04 – 2013-01-05 (×7): 2 [IU] via SUBCUTANEOUS
  Administered 2013-01-05 (×2): 3 [IU] via SUBCUTANEOUS
  Administered 2013-01-05 – 2013-01-06 (×2): 1 [IU] via SUBCUTANEOUS
  Administered 2013-01-06: 2 [IU] via SUBCUTANEOUS
  Administered 2013-01-06 – 2013-01-09 (×8): 1 [IU] via SUBCUTANEOUS
  Administered 2013-01-09 (×2): 2 [IU] via SUBCUTANEOUS
  Administered 2013-01-10: 3 [IU] via SUBCUTANEOUS
  Administered 2013-01-10: 5 [IU] via SUBCUTANEOUS
  Administered 2013-01-10 – 2013-01-11 (×2): 2 [IU] via SUBCUTANEOUS
  Administered 2013-01-11: 3 [IU] via SUBCUTANEOUS
  Administered 2013-01-11 (×2): 2 [IU] via SUBCUTANEOUS
  Administered 2013-01-12: 1 [IU] via SUBCUTANEOUS
  Administered 2013-01-12: 3 [IU] via SUBCUTANEOUS
  Administered 2013-01-12: 1 [IU] via SUBCUTANEOUS
  Administered 2013-01-12: 2 [IU] via SUBCUTANEOUS
  Administered 2013-01-12: 3 [IU] via SUBCUTANEOUS
  Administered 2013-01-13: 2 [IU] via SUBCUTANEOUS
  Administered 2013-01-13: 1 [IU] via SUBCUTANEOUS
  Administered 2013-01-13: 2 [IU] via SUBCUTANEOUS

## 2012-12-31 MED ORDER — PANTOPRAZOLE SODIUM 40 MG PO TBEC
40.0000 mg | DELAYED_RELEASE_TABLET | Freq: Every day | ORAL | Status: DC
  Administered 2012-12-31 – 2013-01-03 (×4): 40 mg via ORAL
  Filled 2012-12-31 (×3): qty 1

## 2012-12-31 MED ORDER — DEXTROSE 5 % IV SOLN: 1.0000 g | INTRAVENOUS | Status: DC

## 2012-12-31 MED ORDER — BIMATOPROST 0.01 % OP SOLN
1.0000 [drp] | Freq: Every day | OPHTHALMIC | Status: DC
  Administered 2012-12-31 – 2013-01-13 (×14): 1 [drp] via OPHTHALMIC
  Filled 2012-12-31: qty 2.5

## 2012-12-31 MED ORDER — HYDRALAZINE HCL 50 MG PO TABS
100.0000 mg | ORAL_TABLET | Freq: Three times a day (TID) | ORAL | Status: DC
  Administered 2012-12-31 – 2013-01-03 (×9): 100 mg via ORAL
  Filled 2012-12-31 (×12): qty 2

## 2012-12-31 MED ORDER — ONDANSETRON HCL 4 MG/2ML IJ SOLN
4.0000 mg | Freq: Four times a day (QID) | INTRAMUSCULAR | Status: DC | PRN
  Administered 2013-01-01 – 2013-01-11 (×4): 4 mg via INTRAVENOUS
  Filled 2012-12-31 (×4): qty 2

## 2012-12-31 NOTE — Progress Notes (Signed)
INITIAL NUTRITION ASSESSMENT  DOCUMENTATION CODES Per approved criteria  -Obesity Unspecified   INTERVENTION: Suspect pt will not be able to tolerate oral nutrition supplements at this time. RD to continue to follow nutrition care plan and reassess need for oral nutrition supplements at follow-up.  NUTRITION DIAGNOSIS: Inadequate oral intade related to GI distress as evidenced by pt report.   Goal: Intake to meet >90% of estimated nutrition needs.  Monitor:  weight trends, lab trends, I/O's, PO intake, supplement tolerance  Reason for Assessment: Malnutrition Screening Tool  79 y.o. female  Admitting Dx: UTI (lower urinary tract infection)  ASSESSMENT: Admitted with abdomen pain x 1 week, n/v/d x 1 day. Work-up reveals UTI. Unable to tolerate PO's. CT negative.  Currently advanced to Full Liquids, she states that she has not been able to tolerate her Clear Liquids, unable to keep anything down except for water. Confirmed this with RN. Pt is unable to report usual weight. No recent weights in EPIC, most recent is from 2 years ago. Pt states that she has lost weight but is unable to provide additional details. Family member at bedside is not contributing any information.  Pt is at risk for malnutrition given declining po intake and suspected weight loss.  Height: Ht Readings from Last 1 Encounters:  12/31/12 5\' 5"  (1.651 m)    Weight: Wt Readings from Last 1 Encounters:  12/31/12 193 lb 3.2 oz (87.635 kg)    Ideal Body Weight: 125 lb  % Ideal Body Weight: 155%  Wt Readings from Last 10 Encounters:  12/31/12 193 lb 3.2 oz (87.635 kg)  04/08/11 223 lb 12 oz (101.492 kg)  04/03/11 212 lb (96.163 kg)  09/27/10 217 lb (98.431 kg)  03/13/10 223 lb (101.152 kg)  09/06/09 218 lb (98.884 kg)  07/18/09 220 lb (99.791 kg)  02/16/09 211 lb (95.709 kg)  07/22/08 192 lb (87.091 kg)  02/02/08 217 lb (98.431 kg)    Usual Body Weight: 220 lb  % Usual Body Weight: 87%  BMI:   Body mass index is 32.15 kg/(m^2). Obese Class I.  Estimated Nutritional Needs: Kcal: 1500 - 1650 kcal Protein: 80 - 90 Fluid: 1.8 - 2 liters  Skin: intact  Diet Order: Full Liquid  EDUCATION NEEDS: -No education needs identified at this time  No intake or output data in the 24 hours ending 12/31/12 1029  Last BM: 3/19  Labs:   Recent Labs Lab 12/30/12 2054 12/31/12 0440  NA 141 141  K 2.7* 3.5  CL 102 102  CO2 20 24  BUN 34* 36*  CREATININE 1.77* 1.93*  CALCIUM 10.0 9.7  GLUCOSE 185* 192*    CBG (last 3)   Recent Labs  12/31/12 0227 12/31/12 0411 12/31/12 0751  GLUCAP 196* 205* 165*    Scheduled Meds: . allopurinol  300 mg Oral Daily  . amLODipine  5 mg Oral Daily  . aspirin EC  81 mg Oral Daily  . atropine  1 drop Left Eye BID  . bimatoprost  1 drop Left Eye Daily  . cefUROXime  250 mg Oral BID WC  . citalopram  20 mg Oral Daily  . Difluprednate  1 drop Ophthalmic QID  . dorzolamide-timolol  1 drop Both Eyes BID  . gabapentin  600 mg Oral TID  . heparin  5,000 Units Subcutaneous Q8H  . hydrALAZINE  100 mg Oral TID  . insulin aspart  0-9 Units Subcutaneous Q4H  . insulin glargine  6 Units Subcutaneous QHS  .  metoprolol tartrate  75 mg Oral BID  . pantoprazole  40 mg Oral Daily  . polyvinyl alcohol  1 drop Left Eye BID  . simvastatin  40 mg Oral q1800    Continuous Infusions:   Past Medical History  Diagnosis Date  . Coronary artery disease   . Renal artery stenosis   . Hypertension   . Dyslipidemia   . GERD (gastroesophageal reflux disease)   . Tension headache   . Abdominal pain   . Anemia, unspecified   . Gout   . Back pain, chronic   . History of tonsillectomy   . History of cholecystectomy   . Depression   . IDDM (insulin dependent diabetes mellitus)   . Diabetes mellitus   . CHF (congestive heart failure)     Past Surgical History  Procedure Laterality Date  . Tonsillectomy    . Cholecystectomy      Jarold Motto  MS, RD, LDN Pager: [phone] After-hours pager: [phone]

## 2012-12-31 NOTE — ED Notes (Signed)
Pt. Vomited small amount of clear liquid. MD made aware and Zofran ordered.

## 2012-12-31 NOTE — Progress Notes (Signed)
Pt transferred from the ED around 0230, admitted to Rm 6706. Pt comes from home with son. She is alert and orientedx4 with delayed responses at times. Ambulatory with standby assist, states she does not use any canes or walkers at home but very weak at this time. No skin breakdown noted. Impaired vision to Right eye, pt has cataracts bilaterally. Oriented to room, instructed to call for assistance before getting out of bed. Resting comfortably at this time, will continue to monitor

## 2012-12-31 NOTE — H&P (Signed)
[facility] History and Physical  [name] ZOX:[id number] DOB: [birth year] DOA: 12/30/2012  Referring physician: ED PCP: Illene Regulus, MD  Specialists: Corinda [facility] cards  Chief Complaint: Abdominal pain  HPI: [name] is a 79 y.o. female who presents with a 1 week history of left sided abdomen and flank pain.  Symptoms have progressively worsened over that time.  Nothing makes it better nor worse.  Developed N/V/D since last night, non bilious non bloody.  No sick contacts.  In the ED patient was found to have a UTI and started on rocephin, hospitalist has been asked to admit.  Review of Systems: 12 systems reviewed and otherwise negative.  Past Medical History  Diagnosis Date  . Coronary artery disease   . Renal artery stenosis   . Hypertension   . Dyslipidemia   . GERD (gastroesophageal reflux disease)   . Tension headache   . Abdominal pain   . Anemia, unspecified   . Gout   . Back pain, chronic   . History of tonsillectomy   . History of cholecystectomy   . Depression   . IDDM (insulin dependent diabetes mellitus)   . Diabetes mellitus   . CHF (congestive heart failure)    Past Surgical History  Procedure Laterality Date  . Tonsillectomy    . Cholecystectomy     Social History:  reports that she has never smoked. She does not have any smokeless tobacco history on file. She reports that she does not drink alcohol or use illicit drugs.   No Known Allergies  Family History  Problem Relation Age of Onset  . Coronary artery disease Father   . Hypertension Father   . Heart attack Father   . Hyperlipidemia Father   . Diabetes Sister   . Hypertension Sister   . Hyperlipidemia Sister   . Hypertension Brother   . Diabetes Brother   . Hyperlipidemia Brother   . Cancer Neg Hx     Breast and colon    Prior to Admission medications   Medication Sig Start Date End Date Taking? Authorizing Provider  allopurinol (ZYLOPRIM) 300 MG tablet Take 300 mg  by mouth daily.     Yes Historical Provider, MD  amLODipine (NORVASC) 5 MG tablet Take 5 mg by mouth daily.   Yes Historical Provider, MD  aspirin 81 MG tablet Take 81 mg by mouth daily.     Yes Historical Provider, MD  atropine 1 % ophthalmic solution Place 1 drop into the left eye 2 (two) times daily.   Yes Historical Provider, MD  bimatoprost (LUMIGAN) 0.03 % ophthalmic solution Place 1 drop into the left eye daily.    Yes Historical Provider, MD  CALCITRIOL PO Take by mouth daily.     Yes Historical Provider, MD  citalopram (CELEXA) 20 MG tablet Take 20 mg by mouth daily.     Yes Historical Provider, MD  colchicine 0.6 MG tablet Take 0.6 mg by mouth 2 (two) times daily. Takes 1-2 tablets daily as needed for gout flareup.   Yes Historical Provider, MD  diazepam (VALIUM) 5 MG tablet Take 5 mg by mouth every 6 (six) hours as needed for anxiety. 04/08/11  Yes Jacques Navy, MD  Difluprednate (DUREZOL) 0.05 % EMUL Apply 1 drop to eye 4 (four) times daily.   Yes Historical Provider, MD  dorzolamide-timolol (COSOPT) 22.3-6.8 MG/ML ophthalmic solution Place 1 drop into both eyes 2 (two) times daily.   Yes Historical Provider, MD  ergocalciferol (VITAMIN D2)  50000 UNITS capsule Take 50,000 Units by mouth once a week.     Yes Historical Provider, MD  furosemide (LASIX) 80 MG tablet Take 80 mg by mouth daily.   Yes Historical Provider, MD  gabapentin (NEURONTIN) 600 MG tablet Take 1 tablet (600 mg total) by mouth 3 (three) times daily. 07/12/11  Yes Jacques Navy, MD  hydrALAZINE (APRESOLINE) 100 MG tablet Take 100 mg by mouth 3 (three) times daily.   Yes Historical Provider, MD  insulin glargine (LANTUS) 100 UNIT/ML injection Inject 6 Units into the skin at bedtime.   Yes Historical Provider, MD  insulin lispro (HUMALOG) 100 UNIT/ML injection Inject 4 Units into the skin 3 (three) times daily before meals.   Yes Historical Provider, MD  metoprolol tartrate (LOPRESSOR) 25 MG tablet Take 75 mg by mouth  2 (two) times daily.   Yes Historical Provider, MD  nitroGLYCERIN (NITROSTAT) 0.4 MG SL tablet Place 0.4 mg under the tongue as needed. For chest pain.    Yes Historical Provider, MD  pantoprazole (PROTONIX) 40 MG tablet Take 40 mg by mouth daily.   Yes Historical Provider, MD  pravastatin (PRAVACHOL) 80 MG tablet Take 80 mg by mouth at bedtime.     Yes Historical Provider, MD  sodium chloride (MURO 128) 5 % ophthalmic solution Place 1 drop into the left eye 2 (two) times daily.   Yes Historical Provider, MD   Physical Exam: Filed Vitals:   12/30/12 2030 12/30/12 2130 12/30/12 2230 12/30/12 2330  BP: 146/58 112/46 109/41 107/47  Pulse: 58 50 51 50  Temp:      TempSrc:      Resp: 22 19 15 14   SpO2: 96% 93% 98% 98%    General:  NAD, sick and vomiting while I am in the room Eyes: EOMI, R eye with cloudy cornea ENT: mucous membranes moist Neck: supple w/o JVD Cardiovascular: RRR w/o MRG Respiratory: CTA B Abdomen: soft, LLQ pain with no rebound nor guarding, nd, bs+, no CVA tenderness Skin: no rash nor lesion Musculoskeletal: MAE, full ROM all 4 extremities Psychiatric: normal tone and affect Neurologic: AAOx3, grossly non-focal  Labs on Admission:  Basic Metabolic Panel:  Recent Labs Lab 12/30/12 2054  NA 141  K 2.7*  CL 102  CO2 20  GLUCOSE 185*  BUN 34*  CREATININE 1.77*  CALCIUM 10.0   Liver Function Tests:  Recent Labs Lab 12/30/12 2054  AST 28  ALT 16  ALKPHOS 123*  BILITOT 0.5  PROT 7.3  ALBUMIN 3.9    Recent Labs Lab 12/30/12 2054  LIPASE 143*   No results found for this basename: AMMONIA,  in the last 168 hours CBC:  Recent Labs Lab 12/30/12 2103  WBC 5.8  HGB 13.3  HCT 39.8  MCV 82.2  PLT 145*   Cardiac Enzymes: No results found for this basename: CKTOTAL, CKMB, CKMBINDEX, TROPONINI,  in the last 168 hours  BNP (last 3 results) No results found for this basename: PROBNP,  in the last 8760 hours CBG: No results found for this  basename: GLUCAP,  in the last 168 hours  Radiological Exams on Admission: Ct Abdomen Pelvis Wo Contrast  12/31/2012  *RADIOLOGY REPORT*  Clinical Data: 1-week history of abdominal pain.  1-day history of nausea/vomiting and diarrhea.  Surgical history includes cholecystectomy.  CT ABDOMEN AND PELVIS WITHOUT CONTRAST  Technique:  Multidetector CT imaging of the abdomen and pelvis was performed following the standard protocol without intravenous contrast.  Oral contrast was  administered.  Comparison: CT abdomen pelvis 09/11/2005 [facility].  Findings: Small hiatal hernia, slightly increased in size since the prior examination.  Stomach otherwise normal in appearance.  Normal- appearing small bowel.  Colon relatively decompressed, containing liquid stool, consistent with given history of diarrhea.  Diffuse colonic diverticulosis without evidence of acute diverticulitis. Appendix not clearly visualized, but no pericecal inflammation.  No ascites.  Normal unenhanced appearance of the liver, spleen, and adrenal glands.  Mild pancreatic atrophy without focal parenchymal abnormality.  No biliary ductal dilation.  Mild scarring involving both kidneys, without evidence of focal parenchymal abnormality, allowing for the unenhanced technique.  Moderate to severe aorto- iliofemoral atherosclerosis without aneurysm.  No significant lymphadenopathy.  Uterus atrophic consistent with age, containing a calcified, degenerated fundal fibroid.  No adnexal masses or free pelvic fluid.  Urinary bladder unremarkable.  Numerous pelvic phleboliths.  Bone window images demonstrate degenerative changes throughout the lower thoracic and lumbar spine, generalized osseous demineralization, and mild degenerative changes involving the sacroiliac joints.  Scar and atelectasis involving the visualized lung bases.  Heart enlarged.  IMPRESSION:  1.  No acute abnormalities involving the abdomen or pelvis. 2.  Small hiatal hernia. 3.   Diffuse colonic diverticulosis without evidence of acute diverticulitis. 4.  Stable mild scarring involving both kidneys. 5.  Stable pancreatic atrophy. 6.  Atrophic uterus containing a calcified, degenerated fundal fibroid.   Original Report Authenticated By: Hulan Saas, M.D.     EKG: Independently reviewed.  Assessment/Plan Principal Problem:   UTI (lower urinary tract infection) Active Problems:   IDDM   HYPERTENSION   ABDOMINAL PAIN   1. UTI with abdominal pain - treating empirically with rocephin, admitting patient, fluids at 75 cc/hr for now and holding her home lasix since she is not tolerating anything PO.  CT didn't show any other obvious cause of her abdominal pain.  Could also be cramps due to the hypokalemia. 2. IDDM - holding home insulin and putting on low dose SSI q4h at the moment since she is essentially NPO 3. Hypokalemia - got 1 run of K in ED, giving 4 additional runs, recheck potassium with AM BMP 4. HTN - continue home meds other than lasix 5. Glaucoma - continue home meds    Code Status: Full Code (must indicate code status--if unknown or must be presumed, indicate so) Family Communication: No family in room (indicate person spoken with, if applicable, with phone number if by telephone) Disposition Plan: Admit to inpatient (indicate anticipated LOS)  Time spent: 70 min  GARDNER, JARED M. [facility] Pager [phone]  If 7PM-7AM, please contact night-coverage [email or web address] Password [email or web address] 12/31/2012, 1:11 AM

## 2012-12-31 NOTE — Progress Notes (Signed)
Subjective: [name] has not been to the office since August '12. She is followed by Dr. Lucianne Muss for diabetes and she reports she has been doing well.   She presented with abdominal pain, poor po intake and weakness. She was found to have a UTI and was admitted for IV antibiotic, potassium replacement and IV fluids. This Am she is awake and reports that she feels better. Is willing to try a diet.   Objective: Lab: Lab Results  Component Value Date   WBC 5.5 12/31/2012   HGB 12.3 12/31/2012   HCT 37.8 12/31/2012   MCV 83.1 12/31/2012   PLT 150 12/31/2012   BMET    Component Value Date/Time   NA 141 12/31/2012 0440   K 3.5 12/31/2012 0440   CL 102 12/31/2012 0440   CO2 24 12/31/2012 0440   GLUCOSE 192* 12/31/2012 0440   BUN 36* 12/31/2012 0440   CREATININE 1.93* 12/31/2012 0440   CALCIUM 9.7 12/31/2012 0440   GFRNONAA 23* 12/31/2012 0440   GFRAA 27* 12/31/2012 0440     Imaging: CT abd/pelvis 12/30/12: IMPRESSION:  1. No acute abnormalities involving the abdomen or pelvis.  2. Small hiatal hernia.  3. Diffuse colonic diverticulosis without evidence of acute  diverticulitis.  4. Stable mild scarring involving both kidneys.  5. Stable pancreatic atrophy.  6. Atrophic uterus containing a calcified, degenerated fundal  fibroid.  Scheduled Meds: . allopurinol  300 mg Oral Daily  . amLODipine  5 mg Oral Daily  . aspirin EC  81 mg Oral Daily  . atropine  1 drop Left Eye BID  . bimatoprost  1 drop Left Eye Daily  . cefTRIAXone (ROCEPHIN)  IV  1 g Intravenous Q24H  . citalopram  20 mg Oral Daily  . Difluprednate  1 drop Ophthalmic QID  . dorzolamide-timolol  1 drop Both Eyes BID  . gabapentin  600 mg Oral TID  . heparin  5,000 Units Subcutaneous Q8H  . hydrALAZINE  100 mg Oral TID  . insulin aspart  0-9 Units Subcutaneous Q4H  . metoprolol tartrate  75 mg Oral BID  . pantoprazole  40 mg Oral Daily  . polyvinyl alcohol  1 drop Left Eye BID  . potassium chloride  10 mEq Intravenous Q1  Hr x 4  . simvastatin  40 mg Oral q1800   Continuous Infusions:  PRN Meds:.colchicine, diazepam, ondansetron (ZOFRAN) IV   Physical Exam: Filed Vitals:   12/31/12 0413  BP: 132/51  Pulse: 53  Temp: 97.4 F (36.3 C)  Resp: 18  elderly AA woman in no distress HEENT- C&S clear Cor - quiet precordium, RRR PUlm - normal respirations Abd - protuberant, BS +, soft, no guarding or rebound       Assessment/Plan: 1. ID - patient with UTI. Normal WBC. Urine culture pending.  Plan  will convert to oral ceftin 250 mg bid.  2. GI - abdominal cramping and loss of appetite. CT negative. Exam benign  Plan Will advance to a full liquid diet.  3. DM - will check A1C  Resume low dose lantus  4. HTN - BP ok will continue to hold lasix.    Illene Regulus Ponce IM (o) [phone]; (c) [phone] Call-grp - Patsi Sears IM  Tele: [phone]  12/31/2012, 7:03 AM

## 2012-12-31 NOTE — Progress Notes (Deleted)
Physical Therapy Treatment Patient Details Name: [name] MRN: [id number] DOB: 03/28/1928 Today's Date: 12/31/2012 Time: 7829-5621 PT Time Calculation (min): 18 min  PT Assessment / Plan / Recommendation Comments on Treatment Session       Follow Up Recommendations   (TBD with incr activity)     Does the patient have the potential to tolerate intense rehabilitation     Barriers to Discharge None      Equipment Recommendations  None recommended by PT    Recommendations for Other Services    Frequency Min 3X/week   Plan      Precautions / Restrictions     Pertinent Vitals/Pain abd pain, # not rated; incr with rolling and pt deferred further activity, requesting to lie still    Mobility  Bed Mobility Bed Mobility: Rolling Left Rolling Left: 5: Set up;With rail Details for Bed Mobility Assistance: Pt under many layers of blankets and needed help removing these; then able to roll to her left with rail; pt then complained of stomach pain and refused further activity    Exercises General Exercises - Lower Extremity Ankle Circles/Pumps: AROM;Both;10 reps;Supine Short Arc Quad: AROM;Both;5 reps;Supine Heel Slides: AROM;Both;Supine (3 reps)   PT Diagnosis: Acute pain  PT Problem List: Decreased activity tolerance;Decreased mobility;Decreased knowledge of use of DME;Pain PT Treatment Interventions: DME instruction;Gait training;Functional mobility training;Therapeutic activities;Therapeutic exercise;Patient/family education   PT Goals Acute Rehab PT Goals PT Goal Formulation: With patient Time For Goal Achievement: 01/07/13 Potential to Achieve Goals: Good Pt will Roll Supine to Left Side: with modified independence PT Goal: Rolling Supine to Left Side - Progress: Goal set today Pt will go Supine/Side to Sit: with modified independence PT Goal: Supine/Side to Sit - Progress: Goal set today Pt will go Sit to Supine/Side: with modified independence PT Goal: Sit to  Supine/Side - Progress: Goal set today Pt will go Sit to Stand: with modified independence PT Goal: Sit to Stand - Progress: Goal set today Pt will Ambulate: 51 - 150 feet;with modified independence;with least restrictive assistive device PT Goal: Ambulate - Progress: Goal set today  Visit Information  Last PT Received On: 12/31/12 Assistance Needed: +1    Subjective Data  Subjective: Reports she has gotten up to [facility] and to sit on side of bed today with nursing. Patient Stated Goal: feel better and return home   Cognition  Cognition Overall Cognitive Status: Appears within functional limits for tasks assessed/performed    Balance     End of Session PT - End of Session Activity Tolerance: Treatment limited secondary to medical complications (Comment) (abd pain) Patient left: in bed;with call bell/phone within reach (HOB elevated to 50)   GP Functional Assessment Tool Used: clinical judgement Functional Limitation: Changing and maintaining body position Changing and Maintaining Body Position Current Status (H0865): At least 40 percent but less than 60 percent impaired, limited or restricted Changing and Maintaining Body Position Goal Status (H8469): At least 1 percent but less than 20 percent impaired, limited or restricted   [name] 12/31/2012, 2:30 PM  12/31/2012 Veda Canning, PT Pager: [phone]

## 2013-01-01 DIAGNOSIS — I2510 Atherosclerotic heart disease of native coronary artery without angina pectoris: Secondary | ICD-10-CM

## 2013-01-01 LAB — GLUCOSE, CAPILLARY
Glucose-Capillary: 206 mg/dL — ABNORMAL HIGH (ref 70–99)
Glucose-Capillary: 254 mg/dL — ABNORMAL HIGH (ref 70–99)

## 2013-01-01 LAB — BASIC METABOLIC PANEL
BUN: 39 mg/dL — ABNORMAL HIGH (ref 6–23)
Creatinine, Ser: 2.8 mg/dL — ABNORMAL HIGH (ref 0.50–1.10)
GFR calc Af Amer: 17 mL/min — ABNORMAL LOW (ref >90)
GFR calc non Af Amer: 15 mL/min — ABNORMAL LOW (ref >90)
Potassium: 3.7 mEq/L (ref 3.5–5.1)

## 2013-01-01 MED ORDER — METOPROLOL TARTRATE 25 MG PO TABS
25.0000 mg | ORAL_TABLET | Freq: Two times a day (BID) | ORAL | Status: DC
  Administered 2013-01-01 – 2013-01-03 (×4): 25 mg via ORAL
  Filled 2013-01-01 (×5): qty 1

## 2013-01-01 NOTE — Progress Notes (Signed)
Subjective: [name] reports that she feels to weak to be able to go home. She has not had any abdominal pain, she has tolerate a full liquid diet.  Objective: Lab: Lab Results  Component Value Date   WBC 5.5 12/31/2012   HGB 12.3 12/31/2012   HCT 37.8 12/31/2012   MCV 83.1 12/31/2012   PLT 150 12/31/2012   BMET    Component Value Date/Time   NA 141 01/01/2013 0440   K 3.7 01/01/2013 0440   CL 104 01/01/2013 0440   CO2 25 01/01/2013 0440   GLUCOSE 163* 01/01/2013 0440   BUN 39* 01/01/2013 0440   CREATININE 2.80* 01/01/2013 0440   CALCIUM 9.6 01/01/2013 0440   GFRNONAA 15* 01/01/2013 0440   GFRAA 17* 01/01/2013 0440     Imaging: Renal U/S - normal Scheduled Meds: . allopurinol  300 mg Oral Daily  . amLODipine  5 mg Oral Daily  . aspirin EC  81 mg Oral Daily  . atorvastatin  20 mg Oral q1800  . atropine  1 drop Left Eye BID  . bimatoprost  1 drop Left Eye Daily  . cefUROXime  250 mg Oral BID WC  . citalopram  20 mg Oral Daily  . Difluprednate  1 drop Ophthalmic QID  . dorzolamide-timolol  1 drop Both Eyes BID  . gabapentin  600 mg Oral TID  . heparin  5,000 Units Subcutaneous Q8H  . hydrALAZINE  100 mg Oral TID  . insulin aspart  0-9 Units Subcutaneous Q4H  . insulin glargine  6 Units Subcutaneous QHS  . metoprolol tartrate  75 mg Oral BID  . pantoprazole  40 mg Oral Daily  . polyvinyl alcohol  1 drop Left Eye BID  . traMADol  50 mg Oral Q6H   Continuous Infusions:  PRN Meds:.diazepam, ondansetron (ZOFRAN) IV   Physical Exam: Filed Vitals:   01/01/13 0948  BP: 127/52  Pulse: 49  Temp: 97.9 F (36.6 C)  Resp: 18  No intake or output data in the 24 hours ending 01/01/13 1026  Gen'l - Elderly AA woman in no distress but appears weak HEENT- occlusive cataract right eye.  Cor - 1+ radial pulse, bradycardic but regular PUlm - normal respirations Abd- protuberant, BS +, no guarding or ebound Neuro - A&O, speech is clear.       Assessment/Plan: 1. ID - UTI,  WBC normal, no fever. Ucx - E.Coli - SS pending. Day #2 ceftin Plan Continue ceftin  2. GI - improved with decreased symptoms. Tolerating full liquids PLan Advance to carb modified diet.  3. DM -  CBG (last 3)   Recent Labs  01/01/13 0412 01/01/13 0558 01/01/13 0800  GLUCAP 156* 157* 174*    Adquate control on present regimen  4. HTN - stable. Bradycardic Will hold next dose of metoprolol and reduce dose to 25 mg bid.  Dispo - will advance diet, will get OOB to chair and ambulate to BR. HOme in AM   [employer] IM (o) [phone]; (c) [phone] Call-grp - Patsi Sears IM  Tele: [phone]  01/01/2013, 10:17 AM

## 2013-01-01 NOTE — Progress Notes (Signed)
PT Cancellation Note  Patient Details Name: [name] MRN: [id number] DOB: Nov 24, 1927   Cancelled Treatment:    Reason Eval/Treat Not Completed: Medical issues which prohibited therapy (Pt began vomiting)   Tung Pustejovsky 01/01/2013, 1:42 PM

## 2013-01-01 NOTE — Progress Notes (Signed)
Physical Therapy Evaluation (late entry for 12/31/12; treatment note pulled instead of eval note)   12/31/12 1419  PT Visit Information  Last PT Received On 12/31/12  Assistance Needed +1  PT Time Calculation  PT Start Time 1359  PT Stop Time 1417  PT Time Calculation (min) 18 min  Subjective Data  Subjective Reports she has gotten up to [facility] and to sit on side of bed today with nursing.  Patient Stated Goal feel better and return home  Home Living  Lives With Son  Available Help at Discharge Family;Available 24 hours/day  Type of Home House  Home Access Level entry  Home Layout One level  Bathroom Accessibility Yes  How Accessible Accessible via walker  Home Adaptive Equipment Walker - rolling;Straight cane  Prior Function  Level of Independence Independent with assistive device(s)  Driving Yes  Comments only uses RW when she goes out of house  Communication  Communication No difficulties  Cognition  Overall Cognitive Status Appears within functional limits for tasks assessed/performed  Right Lower Extremity Assessment  RLE ROM/Strength/Tone WFL for tasks assessed  RLE Sensation WFL - Light Touch  RLE Coordination WFL - gross motor  Left Lower Extremity Assessment  LLE ROM/Strength/Tone WFL for tasks assessed  LLE Sensation WFL - Light Touch  LLE Coordination WFL - gross motor  Bed Mobility  Bed Mobility Rolling Left  Rolling Left 5: Set up;With rail  Details for Bed Mobility Assistance Pt under many layers of blankets and needed help removing these; then able to roll to her left with rail; pt then complained of stomach pain and refused further activity  Exercises  Exercises General Lower Extremity  General Exercises - Lower Extremity  Ankle Circles/Pumps AROM;Both;10 reps;Supine  Short Arc Quad AROM;Both;5 reps;Supine  Heel Slides AROM;Both;Supine (3 reps)  PT - End of Session  Activity Tolerance Treatment limited secondary to medical complications (Comment) (abd  pain)  Patient left in bed;with call bell/phone within reach (HOB elevated to 50)  PT Assessment  Clinical Impression Statement 79 yo adm for abd pain with N/V. She reports several day h/o limited mobility due to illness/weakness. Normally is very independent. Will benefit from further PT assessment to determine any d/c needs (assist, DME, or HHPT?).  PT Recommendation/Assessment Patient needs continued PT services  PT Problem List Decreased activity tolerance;Decreased mobility;Decreased knowledge of use of DME;Pain  Barriers to Discharge None  PT Therapy Diagnosis  Acute pain  PT Plan  PT Frequency Min 3X/week  PT Treatment/Interventions DME instruction;Gait training;Functional mobility training;Therapeutic activities;Therapeutic exercise;Patient/family education  PT Recommendation  Follow Up Recommendations (TBD with incr activity)  PT equipment None recommended by PT  Individuals Consulted  Consulted and Agree with Results and Recommendations Patient  Acute Rehab PT Goals  PT Goal Formulation With patient  Time For Goal Achievement 01/07/13  Potential to Achieve Goals Good  Pt will Roll Supine to Left Side with modified independence  PT Goal: Rolling Supine to Left Side - Progress Goal set today  Pt will go Supine/Side to Sit with modified independence  PT Goal: Supine/Side to Sit - Progress Goal set today  Pt will go Sit to Supine/Side with modified independence  PT Goal: Sit to Supine/Side - Progress Goal set today  Pt will go Sit to Stand with modified independence  PT Goal: Sit to Stand - Progress Goal set today  Pt will Ambulate 51 - 150 feet;with modified independence;with least restrictive assistive device  PT Goal: Ambulate - Progress Goal set today  PT G-Codes **NOT FOR INPATIENT CLASS**  Functional Assessment Tool Used clinical judgement  Functional Limitation Changing and maintaining body position  Changing and Maintaining Body Position Current Status (Z6109) CK   Changing and Maintaining Body Position Goal Status (U0454) CI  PT General Charges  $$ ACUTE PT VISIT 1 Procedure  PT Evaluation  $Initial PT Evaluation Tier I 1 Procedure  PT Treatments  $Therapeutic Exercise 8-22 mins   01/01/2013 Veda Canning, PT Pager: [phone]

## 2013-01-01 NOTE — Progress Notes (Signed)
OT Cancellation Note  Patient Details Name: [name] MRN: [id number] DOB: Dec 12, 1927   Cancelled Treatment:    Reason Eval/Treat Not Completed: Medical issues which prohibited therapy--pt with emesis as we entered the room. Will try back at a later date/time  Evette Georges [phone] 01/01/2013, 2:36 PM

## 2013-01-02 DIAGNOSIS — R197 Diarrhea, unspecified: Secondary | ICD-10-CM

## 2013-01-02 DIAGNOSIS — R112 Nausea with vomiting, unspecified: Secondary | ICD-10-CM

## 2013-01-02 LAB — URINE CULTURE: Colony Count: >=100000

## 2013-01-02 LAB — GLUCOSE, CAPILLARY
Glucose-Capillary: 183 mg/dL — ABNORMAL HIGH (ref 70–99)
Glucose-Capillary: 193 mg/dL — ABNORMAL HIGH (ref 70–99)
Glucose-Capillary: 196 mg/dL — ABNORMAL HIGH (ref 70–99)

## 2013-01-02 MED ORDER — SODIUM CHLORIDE 0.9 % IV SOLN
INTRAVENOUS | Status: DC
  Administered 2013-01-02 – 2013-01-04 (×5): via INTRAVENOUS

## 2013-01-02 NOTE — Progress Notes (Signed)
Subjective: Mrs. Uvaldo Rising reports that she continues to have nausea and did have emesis last night. She has been able to eat and reports that her abdominal pain is better. She has been up to the bathroom but reports that she is too weak to manage at home.  Patient has not been able to particiate with PT - no final recommendations made.   Objective: Lab: Lab Results  Component Value Date   WBC 5.5 12/31/2012   HGB 12.3 12/31/2012   HCT 37.8 12/31/2012   MCV 83.1 12/31/2012   PLT 150 12/31/2012   BMET    Component Value Date/Time   NA 141 01/01/2013 0440   K 3.7 01/01/2013 0440   CL 104 01/01/2013 0440   CO2 25 01/01/2013 0440   GLUCOSE 163* 01/01/2013 0440   BUN 39* 01/01/2013 0440   CREATININE 2.80* 01/01/2013 0440   CALCIUM 9.6 01/01/2013 0440   GFRNONAA 15* 01/01/2013 0440   GFRAA 17* 01/01/2013 0440     Imaging:  Scheduled Meds: . allopurinol  300 mg Oral Daily  . amLODipine  5 mg Oral Daily  . aspirin EC  81 mg Oral Daily  . atorvastatin  20 mg Oral q1800  . atropine  1 drop Left Eye BID  . bimatoprost  1 drop Left Eye Daily  . cefUROXime  250 mg Oral BID WC  . citalopram  20 mg Oral Daily  . Difluprednate  1 drop Ophthalmic QID  . dorzolamide-timolol  1 drop Both Eyes BID  . gabapentin  600 mg Oral TID  . heparin  5,000 Units Subcutaneous Q8H  . hydrALAZINE  100 mg Oral TID  . insulin aspart  0-9 Units Subcutaneous Q4H  . insulin glargine  6 Units Subcutaneous QHS  . metoprolol tartrate  25 mg Oral BID  . pantoprazole  40 mg Oral Daily  . polyvinyl alcohol  1 drop Left Eye BID  . traMADol  50 mg Oral Q6H   Continuous Infusions:  PRN Meds:.diazepam, ondansetron (ZOFRAN) IV   Physical Exam: Filed Vitals:   01/02/13 0800  BP: 112/62  Pulse: 64  Temp: 98.1 F (36.7 C)  Resp: 18    Intake/Output Summary (Last 24 hours) at 01/02/13 1016 Last data filed at 01/02/13 0810  Gross per 24 hour  Intake    120 ml  Output      0 ml  Net    120 ml  No I&Os for previous  days in EPIC  gen'l - obese elderly AA woman who is in no acute distress HEENT - no change Cor - II/VI systolic mm heard best at the LSB, regular Pulm - normal respirations. Abd - obese, BS+, soft w/o guarding or rebound Neuro - Awake, lethargic     Assessment/Plan: 1. ID - day #3 ceftin. Micro - e. Coli, ss to ceftriaxone. No fever. Plan Continue ceftin  2. GI - taking a diet. Had an episode of emesis Plan - continue present diet  3. DM -  CBG (last 3)   Recent Labs  01/02/13 0006 01/02/13 0402 01/02/13 0735  GLUCAP 193* 198* 183*    Running CBGs that are a little high.  4. HTN - stable  5. Renal - significant bump in Creatinine 1.9 to 2.8. Question of prerenal Plan Urine Na & Cr, f/u Bmet, 24 hr Urine for Creatinine clearance  Resume IV fluids  May need renal consult  Illene Regulus [place] IM (o) [phone]; (c) [phone] Call-grp - Patsi Sears IM  Tele:  [phone]  01/02/2013, 10:10 AM

## 2013-01-02 NOTE — Progress Notes (Signed)
Physical Therapy Treatment Patient Details Name: [name] MRN: [id number] DOB: [birth year] Today's Date: 01/02/2013 Time:  -     PT Assessment / Plan / Recommendation Comments on Treatment Session  Pt with increased willingness to participate, some very modest progress compared to initial session. Pt still presents with significant deficits in functional mobility. Rec SNF for continued skilled PT to address deficits and maximize function.    Follow Up Recommendations  SNF     Does the patient have the potential to tolerate intense rehabilitation     Barriers to Discharge        Equipment Recommendations  None recommended by PT    Recommendations for Other Services    Frequency Min 3X/week   Plan Discharge plan needs to be updated    Precautions / Restrictions Restrictions Weight Bearing Restrictions: No   Pertinent Vitals/Pain NAD    Mobility  Bed Mobility Bed Mobility: Rolling Right;Rolling Left;Left Sidelying to Sit;Sitting - Scoot to [place] of Bed;Sit to Supine Rolling Right: 5: Set up Rolling Left: 5: Set up Left Sidelying to Sit: 3: Mod assist Sitting - Scoot to Edge of Bed: 4: Min assist Sit to Supine: 4: Min assist Details for Bed Mobility Assistance: Pt with difficulty coordinating movements Transfers Transfers: Sit to Stand;Stand to Sit Sit to Stand: 2: Max assist Stand to Sit: 2: Max assist Details for Transfer Assistance: Pt with one successful attempt out of 4 trials still requiring max assist to complete. Ambulation/Gait Ambulation/Gait Assistance: Not tested (comment)    Exercises General Exercises - Lower Extremity Ankle Circles/Pumps: AROM;Both;10 reps;Supine Short Arc Quad: AROM;Both;5 reps;Supine Long Arc Quad: AROM;Both;5 reps;Seated (EOB) Heel Slides: AROM;Both;5 reps;Supine    PT Goals Acute Rehab PT Goals PT Goal: Rolling Supine to Left Side - Progress: Progressing toward goal PT Goal: Supine/Side to Sit - Progress: Progressing  toward goal PT Goal: Sit to Supine/Side - Progress: Progressing toward goal PT Goal: Sit to Stand - Progress: Progressing toward goal  Visit Information  Last PT Received On: 01/02/13 Assistance Needed: +1 (may need +2 for ambulation)    Subjective Data  Subjective: Pt states I feel very weak Patient Stated Goal: to go home   Cognition  Cognition Overall Cognitive Status: Appears within functional limits for tasks assessed/performed Arousal/Alertness: Awake/alert    Balance  Balance Balance Assessed: Yes Static Sitting Balance Static Sitting - Balance Support: Feet supported Static Sitting - Level of Assistance: 3: Mod assist Static Sitting - Comment/# of Minutes: 4 minutes, patient very shakey and required max multimodal cues to keep feet on floor Static Standing Balance Static Standing - Balance Support: Bilateral upper extremity supported Static Standing - Level of Assistance: 2: Max assist Static Standing - Comment/# of Minutes: approx 20 seconds before knees buckled and patient sat on bed  End of Session PT - End of Session Equipment Utilized During Treatment: Gait belt Activity Tolerance: Patient limited by fatigue Patient left: in bed;with call bell/phone within reach   GP     Fabio Asa 01/02/2013, 12:47 PM Charlotte Crumb, PT DPT  [phone]

## 2013-01-03 DIAGNOSIS — N179 Acute kidney failure, unspecified: Secondary | ICD-10-CM

## 2013-01-03 DIAGNOSIS — M109 Gout, unspecified: Secondary | ICD-10-CM

## 2013-01-03 LAB — SODIUM, URINE, RANDOM: Sodium, Ur: 14 mEq/L

## 2013-01-03 LAB — URINE MICROSCOPIC-ADD ON

## 2013-01-03 LAB — BASIC METABOLIC PANEL
BUN: 52 mg/dL — ABNORMAL HIGH (ref 6–23)
Calcium: 9.5 mg/dL (ref 8.4–10.5)
GFR calc non Af Amer: 6 mL/min — ABNORMAL LOW (ref >90)
Glucose, Bld: 168 mg/dL — ABNORMAL HIGH (ref 70–99)

## 2013-01-03 LAB — CREATININE, URINE, RANDOM
Creatinine, Urine: 191.4 mg/dL
Creatinine, Urine: 259.52 mg/dL

## 2013-01-03 LAB — URINALYSIS, ROUTINE W REFLEX MICROSCOPIC
Glucose, UA: 100 mg/dL — AB
Hgb urine dipstick: NEGATIVE
Ketones, ur: 15 mg/dL — AB
Protein, ur: NEGATIVE mg/dL

## 2013-01-03 LAB — GLUCOSE, CAPILLARY: Glucose-Capillary: 155 mg/dL — ABNORMAL HIGH (ref 70–99)

## 2013-01-03 MED ORDER — SODIUM CHLORIDE 0.9 % IV BOLUS (SEPSIS)
1000.0000 mL | Freq: Once | INTRAVENOUS | Status: AC
  Administered 2013-01-03: 1000 mL via INTRAVENOUS

## 2013-01-03 MED ORDER — ALLOPURINOL 100 MG PO TABS
200.0000 mg | ORAL_TABLET | Freq: Every day | ORAL | Status: DC
  Administered 2013-01-04 – 2013-01-13 (×6): 200 mg via ORAL
  Filled 2013-01-03 (×11): qty 2

## 2013-01-03 MED ORDER — GABAPENTIN 600 MG PO TABS
600.0000 mg | ORAL_TABLET | Freq: Two times a day (BID) | ORAL | Status: DC
  Filled 2013-01-03: qty 1

## 2013-01-03 NOTE — Progress Notes (Signed)
Subjective: Pharmacy recommendations noted: allopurinol decreased to 200 mg daily; gabapentin stepped down to bid and will reduce to qd if no significant increase in pain.   PT eval noted and appreciated.  Mrs. Vanwyk is more lethargic than usual with slower mentation. No signs of distress  Objective: Lab: Lab Results  Component Value Date   WBC 5.5 12/31/2012   HGB 12.3 12/31/2012   HCT 37.8 12/31/2012   MCV 83.1 12/31/2012   PLT 150 12/31/2012   BMET    Component Value Date/Time   NA 137 01/03/2013 0440   K 4.4 01/03/2013 0440   CL 97 01/03/2013 0440   CO2 22 01/03/2013 0440   GLUCOSE 168* 01/03/2013 0440   BUN 52* 01/03/2013 0440   CREATININE 6.24* 01/03/2013 0440   CALCIUM 9.5 01/03/2013 0440   GFRNONAA 6* 01/03/2013 0440   GFRAA 6* 01/03/2013 0440   Urine for creatinine clearance pending, urine lytes pending.   Imaging:  Scheduled Meds: . [START ON 01/04/2013] allopurinol  200 mg Oral Daily  . amLODipine  5 mg Oral Daily  . aspirin EC  81 mg Oral Daily  . atorvastatin  20 mg Oral q1800  . atropine  1 drop Left Eye BID  . bimatoprost  1 drop Left Eye Daily  . cefUROXime  250 mg Oral BID WC  . citalopram  20 mg Oral Daily  . Difluprednate  1 drop Ophthalmic QID  . dorzolamide-timolol  1 drop Both Eyes BID  . gabapentin  600 mg Oral BID  . heparin  5,000 Units Subcutaneous Q8H  . hydrALAZINE  100 mg Oral TID  . insulin aspart  0-9 Units Subcutaneous Q4H  . insulin glargine  6 Units Subcutaneous QHS  . metoprolol tartrate  25 mg Oral BID  . pantoprazole  40 mg Oral Daily  . polyvinyl alcohol  1 drop Left Eye BID  . traMADol  50 mg Oral Q6H   Continuous Infusions: . sodium chloride 50 mL/hr at 01/02/13 2203   PRN Meds:.diazepam, ondansetron (ZOFRAN) IV   Physical Exam: Filed Vitals:   01/03/13 0402  BP: 124/43  Pulse: 58  Temp: 97.4 F (36.3 C)  Resp: 19    Intake/Output Summary (Last 24 hours) at 01/03/13 1109 Last data filed at 01/02/13 2300  Gross per 24  hour  Intake 714.17 ml  Output      0 ml  Net 714.17 ml   Total this adm: + 834 Gen'l - elderly AA woman in no acute distress HEENT_ opaque right lens, heavy cataract OS, edentulous Cor- 2+ radial pulse RRR Pulm - normal respirations, no rales or wheezes Abd- morbidly obese, non-tender Neuro - psycho-motor retardation, myoclonic jerks w/o asterixis Derm - Clear       Assessment/Plan: 1. ID - day #4 ceftin. No fever. No leukocytosis  2. GI - taking a diet. She does not complain of abdominal pain today  3. DM -  CBG (last 3)   Recent Labs  01/03/13 0026 01/03/13 0401 01/03/13 0759  GLUCAP 203* 169* 155*    Continue sliding sclae  4. HTN - stable  5. Renal - no urine output recorded!! Acute renal failure. Question of post-renal with obstruction. Urine studies pending. In '08 Creatinine clearance was 38 m//min.  Plan Reduce IVF to [facility]  Foley catheter now.  Renal consult placed.  Dispo - per PT will plan for SNF when medically stable for d/c.   Casimiro Needle [facility] [place] IM ([phone]; [phone] Call-grp -  Andrey Farmer  Tele: [phone]  01/03/2013, 10:57 AM

## 2013-01-03 NOTE — Consult Note (Signed)
SHIREE ALTEMUS 01/03/2013 Akansha Wyche D Requesting Physician:  Dr Debby Bud  Reason for Consult:  Acute on chronic renal failure HPI: The patient is a 79 y.o. year-old with hx of CKD, HTN, gout, DM2 presented on 3/20 with abdominal and flank pain, with N/V/D.  In ED UA showed possible UTI and pt was admitted and treated with Rocephin then switched to po Ceftin.   Creat on admission was 1.9, the next day up to 2.8, no lab yesterday and today creat is 6.24.  A foley was placed today returning about 200cc of dark amber urine. Patient is lethargic after Tramadol, was interacting with staff prior to Tramadol, not answering questions at this time.   During hospital stay over last 3 days, BP's have been normal, lowest in the 100's, no fever, WBC normal.  Inpatient meds have been norvasc, allopurinol, asa, lipitor, eyedrops, Rocephin IV > po Ceftin, Celexa, Neurontin 600 tid, sq heparin, hydralazine,insulin, metoprolol po, PPI and tramadol. She had CT scan on 3/19 with oral contrast only. No shock, no NSAID"S , no ACEI or ARB.   ROS  not available   Past Medical History:  Past Medical History  Diagnosis Date  . Coronary artery disease   . Renal artery stenosis   . Hypertension   . Dyslipidemia   . GERD (gastroesophageal reflux disease)   . Tension headache   . Abdominal pain   . Anemia, unspecified   . Gout   . Back pain, chronic   . History of tonsillectomy   . History of cholecystectomy   . Depression   . IDDM (insulin dependent diabetes mellitus)   . Diabetes mellitus   . CHF (congestive heart failure)     Past Surgical History:  Past Surgical History  Procedure Laterality Date  . Tonsillectomy    . Cholecystectomy      Family History:  Family History  Problem Relation Age of Onset  . Coronary artery disease Father   . Hypertension Father   . Heart attack Father   . Hyperlipidemia Father   . Diabetes Sister   . Hypertension Sister   . Hyperlipidemia Sister   .  Hypertension Brother   . Diabetes Brother   . Hyperlipidemia Brother   . Cancer Neg Hx     Breast and colon   Social History:  reports that she has never smoked. She does not have any smokeless tobacco history on file. She reports that she does not drink alcohol or use illicit drugs.  Allergies: No Known Allergies  Home medications: Prior to Admission medications   Medication Sig Start Date End Date Taking? Authorizing Provider  allopurinol (ZYLOPRIM) 300 MG tablet Take 300 mg by mouth daily.     Yes Historical Provider, MD  amLODipine (NORVASC) 5 MG tablet Take 5 mg by mouth daily.   Yes Historical Provider, MD  aspirin 81 MG tablet Take 81 mg by mouth daily.     Yes Historical Provider, MD  atropine 1 % ophthalmic solution Place 1 drop into the left eye 2 (two) times daily.   Yes Historical Provider, MD  bimatoprost (LUMIGAN) 0.03 % ophthalmic solution Place 1 drop into the left eye daily.    Yes Historical Provider, MD  citalopram (CELEXA) 20 MG tablet Take 20 mg by mouth daily.     Yes Historical Provider, MD  colchicine 0.6 MG tablet Take 0.6 mg by mouth 2 (two) times daily. Takes 1-2 tablets daily as needed for gout flareup.   Yes Historical  Provider, MD  diazepam (VALIUM) 5 MG tablet Take 5 mg by mouth every 6 (six) hours as needed for anxiety. 04/08/11  Yes Jacques Navy, MD  Difluprednate (DUREZOL) 0.05 % EMUL Apply 1 drop to eye 4 (four) times daily.   Yes Historical Provider, MD  dorzolamide-timolol (COSOPT) 22.3-6.8 MG/ML ophthalmic solution Place 1 drop into both eyes 2 (two) times daily.   Yes Historical Provider, MD  ergocalciferol (VITAMIN D2) 50000 UNITS capsule Take 50,000 Units by mouth once a week.     Yes Historical Provider, MD  furosemide (LASIX) 80 MG tablet Take 80 mg by mouth daily.   Yes Historical Provider, MD  gabapentin (NEURONTIN) 600 MG tablet Take 1 tablet (600 mg total) by mouth 3 (three) times daily. 07/12/11  Yes Jacques Navy, MD  hydrALAZINE  (APRESOLINE) 100 MG tablet Take 100 mg by mouth 3 (three) times daily.   Yes Historical Provider, MD  insulin glargine (LANTUS) 100 UNIT/ML injection Inject 6 Units into the skin at bedtime.   Yes Historical Provider, MD  insulin lispro (HUMALOG) 100 UNIT/ML injection Inject 4 Units into the skin 3 (three) times daily before meals.   Yes Historical Provider, MD  metoprolol tartrate (LOPRESSOR) 25 MG tablet Take 75 mg by mouth 2 (two) times daily.   Yes Historical Provider, MD  nitroGLYCERIN (NITROSTAT) 0.4 MG SL tablet Place 0.4 mg under the tongue as needed. For chest pain.    Yes Historical Provider, MD  pantoprazole (PROTONIX) 40 MG tablet Take 40 mg by mouth daily.   Yes Historical Provider, MD  pravastatin (PRAVACHOL) 80 MG tablet Take 80 mg by mouth at bedtime.     Yes Historical Provider, MD  sodium chloride (MURO 128) 5 % ophthalmic solution Place 1 drop into the left eye 2 (two) times daily.   Yes Historical Provider, MD    Labs: Basic Metabolic Panel:  Recent Labs Lab 12/30/12 2054 12/31/12 0440 01/01/13 0440 01/03/13 0440  NA 141 141 141 137  K 2.7* 3.5 3.7 4.4  CL 102 102 104 97  CO2 20 24 25 22   GLUCOSE 185* 192* 163* 168*  BUN 34* 36* 39* 52*  CREATININE 1.77* 1.93* 2.80* 6.24*  CALCIUM 10.0 9.7 9.6 9.5   Liver Function Tests:  Recent Labs Lab 12/30/12 2054  AST 28  ALT 16  ALKPHOS 123*  BILITOT 0.5  PROT 7.3  ALBUMIN 3.9    Recent Labs Lab 12/30/12 2054  LIPASE 143*   No results found for this basename: AMMONIA,  in the last 168 hours CBC:  Recent Labs Lab 12/30/12 2103 12/31/12 0440  WBC 5.8 5.5  HGB 13.3 12.3  HCT 39.8 37.8  MCV 82.2 83.1  PLT 145* 150   PT/INR: @LABRCNTIP (inr:5) Cardiac Enzymes: ) Recent Labs Lab 12/31/12 0114  TROPONINI <0.30   CBG:  Recent Labs Lab 01/02/13 2034 01/03/13 0026 01/03/13 0401 01/03/13 0759 01/03/13 1155  GLUCAP 171* 203* 169* 155* 183*     Physical Exam:  Blood pressure 104/41, pulse  72, temperature 97.4 F (36.3 C), temperature source Oral, resp. rate 18, height 5\' 5"  (1.651 m), weight 90.4 kg (199 lb 4.7 oz), SpO2 92.00%.  Gen: lethargic after Tramadol Skin: no rash, cyanosis HEENT:  EOMI, sclera anicteric, throat moist Neck: no JVD, no LAN, +transmitted murmur to R carotid Chest: crackles R base, L clear , no wheezing CV: regular, no rub or gallop, 2-3/6 SEM RUSB to neck, pedal pulses weak bilat Abdomen: soft, obese, nontender, +  BS, no HSM or ascites Ext: trace-1+ pretibial edema bilat, no joint effusion or deformity, +brawny skin changes with darkening discoloration bilat, no gangrene or ulceration Neuro: sedated after pain meds, moves all 4 ext, mild myoclonic jerking with attempt to move arms  UA 3/19 1.017, 5.0, 30 prot, 21-50 wbc, no rbc, many bact and epi's CT abd 3/20 for abd pain, NO contrast >> mild panc atrophy, mild scarring bilat kidneys w/o hydro, diverticulosis, no ascites, atrophic uterus with fibroid, mod-severe aorto-iliofemoral disease ECHO Jan '12 >> normal LVEF, mild pulm HTN 44mm Renal US 12/31/12 >> 9.9/10.4 cm, no hydro, mild bilat cortical thinning  Date  Creat  eGFR 2009  1.7-2.5  19-28 2012  1.5-1.6  31-32 12/30/12 1.77  26 01/02/13 2.80  15 01/03/13 6.24  6   Impression/Plan 1. Acute on chronic renal failure- unclear etiology, no nephrotoxins, BP relatively low and prob is volume depleted. No IV contrast. Could have ATN, AIN due to meds (PPI, ceph) or vol depletion alone. No obstruction. Plan stop all BP meds, d/c PPI and Ceftin for ?AIN, increase IVF's, foley placed, UNa, Ucreat, repeat UA, check cpk, d/c sedating meds (neurontin, valium), cautious with tramadol. Will follow closely. 2. UTI E Coli 3. DM2 4. HTN on multiple BP meds at home   Vinson Moselle  MD [facility] [phone] pgr    [phone] cell 01/03/2013, 2:51 PM

## 2013-01-04 ENCOUNTER — Inpatient Hospital Stay (HOSPITAL_COMMUNITY): Payer: Medicare Other

## 2013-01-04 LAB — RENAL FUNCTION PANEL
CO2: 20 mEq/L (ref 19–32)
Calcium: 8.9 mg/dL (ref 8.4–10.5)
Chloride: 100 mEq/L (ref 96–112)
GFR calc Af Amer: 5 mL/min — ABNORMAL LOW (ref >90)
GFR calc non Af Amer: 4 mL/min — ABNORMAL LOW (ref >90)
Sodium: 139 mEq/L (ref 135–145)

## 2013-01-04 LAB — GLUCOSE, CAPILLARY
Glucose-Capillary: 185 mg/dL — ABNORMAL HIGH (ref 70–99)
Glucose-Capillary: 162 mg/dL — ABNORMAL HIGH (ref 70–99)
Glucose-Capillary: 189 mg/dL — ABNORMAL HIGH (ref 70–99)

## 2013-01-04 MED ORDER — BOOST / RESOURCE BREEZE PO LIQD
1.0000 | Freq: Two times a day (BID) | ORAL | Status: DC
  Administered 2013-01-04 – 2013-01-13 (×10): 1 via ORAL

## 2013-01-04 NOTE — Progress Notes (Signed)
I spoke to pts daughter [name], son Ivar Drape and son Jonny Ruiz as well as a daughter in law regarding situation.  Ms. Hollice Gong was living at home independently PTA with just needing help with heavy household tasks.   I am still hopeful that kidney function will eventually recover, however, given that she has been oliguric for almost 48 hours and has decreased MS almost certainly related to uremia, family is agreeable that if no significant UOP over the next 12 hours that they wish to proceed with short term hemodialysis until recovery.  On the chance that she does not recover renal function, the family is quite certain that she would not opt for chronic hemodialysis therapy as it would change her lifestyle too much.  I also took the opportunity to speak with them about code status because they were quite confident  that Ms. Giarratano would not want to be "hooked to machines"  They have agreed to DNR status  Jeannene Tschetter A

## 2013-01-04 NOTE — Progress Notes (Signed)
Subjective: Arousable but lethargic. Denies any pain: no abdominal or chest pain.  Objective: Lab: Lab Results  Component Value Date   WBC 5.5 12/31/2012   HGB 12.3 12/31/2012   HCT 37.8 12/31/2012   MCV 83.1 12/31/2012   PLT 150 12/31/2012   BMET    Component Value Date/Time   NA 137 01/03/2013 0440   K 4.4 01/03/2013 0440   CL 97 01/03/2013 0440   CO2 22 01/03/2013 0440   GLUCOSE 168* 01/03/2013 0440   BUN 52* 01/03/2013 0440   CREATININE 6.24* 01/03/2013 0440   CALCIUM 9.5 01/03/2013 0440   GFRNONAA 6* 01/03/2013 0440   GFRAA 6* 01/03/2013 0440   U Na 14, Urine Cr  260  FE Na   0.25% = prerenal  Imaging:  Scheduled Meds: . allopurinol  200 mg Oral Daily  . aspirin EC  81 mg Oral Daily  . atorvastatin  20 mg Oral q1800  . atropine  1 drop Left Eye BID  . bimatoprost  1 drop Left Eye Daily  . citalopram  20 mg Oral Daily  . Difluprednate  1 drop Ophthalmic QID  . dorzolamide-timolol  1 drop Both Eyes BID  . heparin  5,000 Units Subcutaneous Q8H  . insulin aspart  0-9 Units Subcutaneous Q4H  . insulin glargine  6 Units Subcutaneous QHS  . polyvinyl alcohol  1 drop Left Eye BID  . traMADol  50 mg Oral Q6H   Continuous Infusions: . sodium chloride 125 mL/hr at 01/03/13 2219   PRN Meds:.ondansetron (ZOFRAN) IV   Physical Exam: Filed Vitals:   01/04/13 0353  BP: 113/62  Pulse: 53  Temp: 98.4 F (36.9 C)  Resp: 20    Intake/Output Summary (Last 24 hours) at 01/04/13 1610 Last data filed at 01/04/13 0300  Gross per 24 hour  Intake   2235 ml  Output    200 ml  Net   2035 ml   Total this adm - 2,869 Gen'l - elderly AA woman looking older than her stated age HEENT - Opague lens OD, cataract OS; lips are bright red, edentulous Neck - no thyromegaly Cor - 2+ radial pulse, quiet precordium, distant heart sounds, RRR, II/VII systolic murmur best at RSB Pul - no increased WOB, difficult exam - poor breath sounds, no rales or wheezes Abd- obese, soft, no guarding or  rebound Neuro - somnolent but arousable, knows she is in the hospital. Has frequent myoclonic movements. Derm - distal LE with changes of chronic venous statis, no skin breakdown.      Assessment/Plan: 1. ID - #4/4 ceftin for UTI - antibiotics stopped secondary to acute renal failure. No fever.   2. GI - had been taking a diet. No complaints of abdominal pain  3. DM -  CBG (last 3)   Recent Labs  01/03/13 2004 01/04/13 01/04/13 0352  GLUCAP 186* 172* 185*    Continue sliding scale.  4. HTN - meds held secondary to acute renal failure. BP is soft.  Plan - continue to hold meds  5. Renal - AM labs pending. U cr, Na back: FE-Na = <1% suggesting prerenal source of failure. Renal U/S done several days ago - normal. Has been getting IVF at 125 but UOP is negligible. She has increased mytonic movements but no uremic frost. Question of uremic encephalopathy. She is approximately 3 liters ahead  Plan PCXR given poor breath sounds and positive IO.  Continue to hold all possible offending agents  Per Renal consult.  Of note patient when asked had never considered HD and has no ACP documents.   Illene Regulus [place] [facility] (o) [phone]; (c) [phone] Call-grp - Patsi Sears IM  Tele: [phone]  01/04/2013, 6:20 AM

## 2013-01-04 NOTE — Progress Notes (Signed)
[facility] - PROGRESS NOTE Resident Note   Please see below for attending addendum to resident note.  Subjective:   79 year old African American female sleeping but briefly arousable this morning. Afebrile. Denies any chest or abdominal pain. Foley output dark amber in color. BP 104-124/ 41-62. Cr today up to 7.53 from 6.24 yesterday. Ceph and BP meds d/c'd yesterday due to worsening Cr. Not arousable to my exam. Not much UOP  Objective:    Vital Signs:   Temp:  [97.4 F (36.3 C)-98.4 F (36.9 C)] 98.4 F (36.9 C) (03/24 0353) Pulse Rate:  [52-72] 53 (03/24 0353) Resp:  [18-20] 20 (03/24 0353) BP: (104-124)/(41-62) 113/62 mmHg (03/24 0353) SpO2:  [91 %-94 %] 94 % (03/24 0353) Last BM Date: 01/01/13  Weight trends: Filed Weights   12/31/12 0234 01/01/13 2200  Weight: 87.635 kg (193 lb 3.2 oz) 90.4 kg (199 lb 4.7 oz)    Intake/Output:  03/23 0701 - 03/24 0700 In: 2735 [P.O.:60; I.V.:2675] Out: 200 [Urine:200]  Physical Exam: General: Vital signs reviewed and noted. Well-developed, well-nourished, in no acute distress; lethargic and drowsy on examination.  HEENT Normocephalic, atraumatic, edentulous  Neck No thyromegaly, no JVD appreciated.  Lungs:  Difficult- diminished breath sounds; no crackles or wheezes appreciated.  Heart: Distant heart sounds; RRR. II/VI systolic murmur best heart at the RSB.  Abdomen:  Obese, soft, nondistended.  No masses or organomegaly.  Extremities: Pretibial hyperpigmentation bilaterally, no edema.     Labs: Basic Metabolic Panel:  Recent Labs Lab 01/01/13 0440 01/03/13 0440 01/04/13 0535  NA 141 137 139  K 3.7 4.4 4.5  CL 104 97 100  CO2 25 22 20   GLUCOSE 163* 168* 170*  BUN 39* 52* 61*  CREATININE 2.80* 6.24* 7.53*  CALCIUM 9.6 9.5 8.9  PHOS  --   --  8.9*    Liver Function Tests:  Recent Labs Lab 12/30/12 2054 01/04/13 0535  AST 28  --   ALT 16  --   ALKPHOS 123*  --   BILITOT 0.5  --   PROT 7.3  --    ALBUMIN 3.9 3.4*    Recent Labs Lab 12/30/12 2054  LIPASE 143*   No results found for this basename: AMMONIA,  in the last 168 hours  CBC:  Recent Labs Lab 12/30/12 2103 12/31/12 0440  WBC 5.8 5.5  HGB 13.3 12.3  HCT 39.8 37.8  MCV 82.2 83.1  PLT 145* 150    Cardiac Enzymes:  Recent Labs Lab 12/31/12 0114 01/03/13 0440  CKTOTAL  --  101  TROPONINI <0.30  --     BNP: No components found with this basename: POCBNP,   CBG:  Recent Labs Lab 01/03/13 1155 01/03/13 1636 01/03/13 2004 01/04/13 01/04/13 0352  GLUCAP 183* 163* 186* 172* 185*    Microbiology: Results for orders placed during the hospital encounter of 12/30/12  URINE CULTURE     Status: None   Collection Time    12/30/12 10:12 PM      Result Value Range Status   Specimen Description URINE, CATHETERIZED   Final   Special Requests NONE   Final   Culture  Setup Time 12/30/2012 23:10   Final   Colony Count >=100,000 COLONIES/ML   Final   Culture ESCHERICHIA COLI   Final   Report Status 01/02/2013 FINAL   Final   Organism ID, Bacteria ESCHERICHIA COLI   Final    Coagulation Studies: No results found for this basename: LABPROT,  INR,  in the last 72 hours  Urinalysis:    Component Value Date/Time   COLORURINE ORANGE* 01/03/2013 1650   APPEARANCEUR CLOUDY* 01/03/2013 1650   LABSPEC 1.028 01/03/2013 1650   PHURINE 5.0 01/03/2013 1650   GLUCOSEU 100* 01/03/2013 1650   HGBUR NEGATIVE 01/03/2013 1650   BILIRUBINUR MODERATE* 01/03/2013 1650   KETONESUR 15* 01/03/2013 1650   PROTEINUR NEGATIVE 01/03/2013 1650   UROBILINOGEN 0.2 01/03/2013 1650   NITRITE NEGATIVE 01/03/2013 1650   LEUKOCYTESUR MODERATE* 01/03/2013 1650     Imaging: Dg Chest Port 1 View  01/04/2013  *RADIOLOGY REPORT*  Clinical Data: Shortness of breath.  PORTABLE CHEST - 1 VIEW  Comparison: Chest x-ray 06/04/2011.  Findings: Lung volumes are low.  There are extensive midlung and bibasilar opacities which may reflect areas of  atelectasis and/or consolidation.  Probable small bilateral pleural effusions. Pulmonary venous congestion.  Slight indistinctness of interstitial markings.  Mild cardiomegaly. The patient is rotated to the left on today's exam, resulting in distortion of the mediastinal contours and reduced diagnostic sensitivity and specificity for mediastinal pathology.  Atherosclerosis in the thoracic aorta.  IMPRESSION: 1.  Overall, the appearance of chest as detailed above is most suggestive of congestive heart failure.  However, the possibility of superimposed multilobar pneumonia or sequelae of aspiration is not excluded given the appearance.  Clinical correlation is recommended.   Original Report Authenticated By: Trudie Reed, M.D.       Medications:    Infusions: . sodium chloride 125 mL/hr at 01/04/13 1478    Scheduled Medications: . allopurinol  200 mg Oral Daily  . aspirin EC  81 mg Oral Daily  . atorvastatin  20 mg Oral q1800  . atropine  1 drop Left Eye BID  . bimatoprost  1 drop Left Eye Daily  . citalopram  20 mg Oral Daily  . Difluprednate  1 drop Ophthalmic QID  . dorzolamide-timolol  1 drop Both Eyes BID  . heparin  5,000 Units Subcutaneous Q8H  . insulin aspart  0-9 Units Subcutaneous Q4H  . insulin glargine  6 Units Subcutaneous QHS  . polyvinyl alcohol  1 drop Left Eye BID  . traMADol  50 mg Oral Q6H    PRN Medications: ondansetron (ZOFRAN) IV   Assessment/ Plan:    Pt is a 79 y.o. yo female with a PMHx of CKD, HTN, DM II, gout who was admitted on 12/30/2012 for abdominal/flank pain and UTI. Renal service is consulted for worsening Cr.  1) A/C Renal Failure. Thought maybe due to hypotension vs UTI  Continue to hold BP meds and neurontin 2/2 renal failure. Continue to hold abx 2/2 renal failure. Fena = (0.3%) = <1% suggesting prerenal source. Pt has been getting IV fluids at 125 but UOP is negligible. ? Encephalopathy. No uremic frost or evidence of bleeding. GFR = 6, may  consider HD. This is certainly troubling that she is oliguric and creatinine cont to rise.  She does not have acute indications for HD but her MS is most likely due to uremia.  I have made contact with one son today Fayrene Fearing and have told him that I would like to try IVF another 24 hours but if that is not successful I want to meet with he and his siblings tomorrow to discuss the future.  I think patient would be marginal candidate for chronic dialysis but need to feel the family out on this.    2) HTN/vol- agree with continuing IVF at 125 per hour,  does not seem wet to me.   3) Anemia- not an issue that has required treatment yet.    Length of Stay: 5 days  Patient history and plan of care reviewed with attending, Dr. Kathrene Bongo.   Winnifred Friar, Student-PA  01/04/2013, 8:51 AM  Patient seen and examined, agree with above note with above modifications.  Annie Sable, MD 01/04/2013

## 2013-01-04 NOTE — Progress Notes (Signed)
PT Cancellation Note  Patient Details Name: [name] MRN: [id number] DOB: [birth year]   Cancelled Treatment:    Reason Eval/Treat Not Completed: Patient's level of consciousness. Pt very sleepy today, Unable to rouse for PT. Nursing aware. Will check back tomorrow   Donnetta Hail 01/04/2013, 11:03 AM

## 2013-01-04 NOTE — Progress Notes (Signed)
NUTRITION FOLLOW UP  DOCUMENTATION CODES  Per approved criteria  -Obesity Unspecified   Intervention:   - Resource Breeze BID. Each supplement provides 250 kcal and 9 grams of protein.  - Suspect intake to not improve until uremia resolves.  Nutrition Dx:   Inadequate oral intade related to GI distress as evidenced by pt report. - ongoing   Goal:   Pt to meet >90% of estimated nutrition needs - unmet   Monitor:   weight trends, lab trends, I/O's, PO intake, supplement tolerance   Assessment:   Pt admitted with abdomen pain x 1 week, n/v/d x 1 day. Pt admitted with UTI. Pt with acute on chronic renal failure with PMH of CKD, HTN, gout, and T2DM. Pt currently with symptoms of uremia.   Dietetic Intern visited pt.  Pt confused and unable to answer questions. Pt with lunch tray untouched. Intern spoke with Nurse tech about intake; tech reports pt has not been eating or drinking today. Pt's po intake declining since the weekend.   Suspect ongoing lack of oral intake likely due to uremia.    Height: Ht Readings from Last 1 Encounters:  12/31/12 5\' 5"  (1.651 m)    Weight Status:   Wt Readings from Last 1 Encounters:  01/01/13 199 lb 4.7 oz (90.4 kg)  12/31/12 193 lbs  Re-estimated needs:  Kcal: 1500-1650 kcal   Protein: 80-90 gm Fluid: 1.8 - 2 L   Skin: intact   Diet Order: Carb Control   Intake/Output Summary (Last 24 hours) at 01/04/13 1307 Last data filed at 01/04/13 0900  Gross per 24 hour  Intake   2735 ml  Output    200 ml  Net   2535 ml    Last BM: 01/01/2013   Labs:   Recent Labs Lab 01/01/13 0440 01/03/13 0440 01/04/13 0535  NA 141 137 139  K 3.7 4.4 4.5  CL 104 97 100  CO2 25 22 20   BUN 39* 52* 61*  CREATININE 2.80* 6.24* 7.53*  CALCIUM 9.6 9.5 8.9  PHOS  --   --  8.9*  GLUCOSE 163* 168* 170*    CBG (last 3)   Recent Labs  01/04/13 0352 01/04/13 0856 01/04/13 1117  GLUCAP 185* 162* 187*    Scheduled Meds: . allopurinol  200 mg  Oral Daily  . aspirin EC  81 mg Oral Daily  . atorvastatin  20 mg Oral q1800  . atropine  1 drop Left Eye BID  . bimatoprost  1 drop Left Eye Daily  . citalopram  20 mg Oral Daily  . Difluprednate  1 drop Ophthalmic QID  . dorzolamide-timolol  1 drop Both Eyes BID  . heparin  5,000 Units Subcutaneous Q8H  . insulin aspart  0-9 Units Subcutaneous Q4H  . insulin glargine  6 Units Subcutaneous QHS  . polyvinyl alcohol  1 drop Left Eye BID    Continuous Infusions: . sodium chloride 125 mL/hr at 01/04/13 1610    Belenda Cruise  Dietetic Intern Pager: [phone]  I agree with the above information. Jarold Motto MS, RD, LDN Pager: [phone] After-hours pager: [phone]

## 2013-01-04 NOTE — Progress Notes (Signed)
OT Cancellation Note  Patient Details Name: [name] MRN: [id number] DOB: [birth year]   Cancelled Treatment:    Reason Eval/Treat Not Completed: Other (comment) Pt to D/C to SNF. Will defer OT  To SNF. If eval needed, or D/C plan changes to home, please reorder. Thank you. [facility] Carroll Lingelbach, OTR/L  [phone] 01/04/2013 01/04/2013, 10:16 AM

## 2013-01-04 NOTE — Clinical Social Work Note (Signed)
CSW received consult regarding patient needing SNF for rehab. CSW will follow-up with patient/family to assess regarding SNF placement.  Genelle Bal, MSW, LCSW [phone]

## 2013-01-05 ENCOUNTER — Inpatient Hospital Stay (HOSPITAL_COMMUNITY): Payer: Medicare Other

## 2013-01-05 ENCOUNTER — Encounter (HOSPITAL_COMMUNITY): Payer: Self-pay | Admitting: Radiology

## 2013-01-05 DIAGNOSIS — I129 Hypertensive chronic kidney disease with stage 1 through stage 4 chronic kidney disease, or unspecified chronic kidney disease: Secondary | ICD-10-CM

## 2013-01-05 DIAGNOSIS — J810 Acute pulmonary edema: Secondary | ICD-10-CM

## 2013-01-05 LAB — APTT: aPTT: 35 seconds (ref 24–37)

## 2013-01-05 LAB — GLUCOSE, CAPILLARY
Glucose-Capillary: 137 mg/dL — ABNORMAL HIGH (ref 70–99)
Glucose-Capillary: 186 mg/dL — ABNORMAL HIGH (ref 70–99)
Glucose-Capillary: 193 mg/dL — ABNORMAL HIGH (ref 70–99)
Glucose-Capillary: 203 mg/dL — ABNORMAL HIGH (ref 70–99)

## 2013-01-05 LAB — CBC
HCT: 31.7 % — ABNORMAL LOW (ref 36.0–46.0)
Hemoglobin: 10.5 g/dL — ABNORMAL LOW (ref 12.0–15.0)
MCH: 27.5 pg (ref 26.0–34.0)
MCHC: 33.1 g/dL (ref 30.0–36.0)
MCV: 83 fL (ref 78.0–100.0)

## 2013-01-05 LAB — PROTIME-INR: INR: 1.2 (ref 0.00–1.49)

## 2013-01-05 LAB — RENAL FUNCTION PANEL
BUN: 79 mg/dL — ABNORMAL HIGH (ref 6–23)
CO2: 16 mEq/L — ABNORMAL LOW (ref 19–32)
Calcium: 9.1 mg/dL (ref 8.4–10.5)
Creatinine, Ser: 8.59 mg/dL — ABNORMAL HIGH (ref 0.50–1.10)
GFR calc non Af Amer: 4 mL/min — ABNORMAL LOW (ref >90)
Glucose, Bld: 217 mg/dL — ABNORMAL HIGH (ref 70–99)

## 2013-01-05 MED ORDER — ALTEPLASE 2 MG IJ SOLR: 2.0000 mg | Freq: Once | INTRAMUSCULAR | Status: DC | PRN

## 2013-01-05 MED ORDER — SODIUM CHLORIDE 0.9 % IV SOLN: 100.0000 mL | INTRAVENOUS | Status: DC | PRN

## 2013-01-05 MED ORDER — LIDOCAINE-PRILOCAINE 2.5-2.5 % EX CREA: 1.0000 "application " | TOPICAL_CREAM | CUTANEOUS | Status: DC | PRN

## 2013-01-05 MED ORDER — HEPARIN SODIUM (PORCINE) 1000 UNIT/ML DIALYSIS
20.0000 [IU]/kg | INTRAMUSCULAR | Status: DC | PRN
  Filled 2013-01-05: qty 2

## 2013-01-05 MED ORDER — LIDOCAINE HCL (PF) 1 % IJ SOLN: 5.0000 mL | INTRAMUSCULAR | Status: DC | PRN

## 2013-01-05 MED ORDER — NEPRO/CARBSTEADY PO LIQD: 237.0000 mL | ORAL | Status: DC | PRN

## 2013-01-05 MED ORDER — FENTANYL CITRATE 0.05 MG/ML IJ SOLN
INTRAMUSCULAR | Status: AC | PRN
  Administered 2013-01-05: 25 ug via INTRAVENOUS

## 2013-01-05 MED ORDER — CEFAZOLIN SODIUM-DEXTROSE 2-3 GM-% IV SOLR
2.0000 g | INTRAVENOUS | Status: AC
  Administered 2013-01-05: 2 g via INTRAVENOUS
  Filled 2013-01-05: qty 50

## 2013-01-05 MED ORDER — HEPARIN SODIUM (PORCINE) 1000 UNIT/ML DIALYSIS
1000.0000 [IU] | INTRAMUSCULAR | Status: DC | PRN
  Filled 2013-01-05: qty 1

## 2013-01-05 MED ORDER — PENTAFLUOROPROP-TETRAFLUOROETH EX AERO: 1.0000 "application " | INHALATION_SPRAY | CUTANEOUS | Status: DC | PRN

## 2013-01-05 MED ORDER — HEPARIN SODIUM (PORCINE) 5000 UNIT/ML IJ SOLN
5000.0000 [IU] | Freq: Three times a day (TID) | INTRAMUSCULAR | Status: DC
  Administered 2013-01-06 – 2013-01-13 (×21): 5000 [IU] via SUBCUTANEOUS
  Filled 2013-01-05 (×27): qty 1

## 2013-01-05 MED ORDER — DIFLUPREDNATE 0.05 % OP EMUL
1.0000 [drp] | Freq: Four times a day (QID) | OPHTHALMIC | Status: DC
  Administered 2013-01-05 – 2013-01-13 (×31): 1 [drp] via OPHTHALMIC

## 2013-01-05 MED ORDER — MIDAZOLAM HCL 2 MG/2ML IJ SOLN
INTRAMUSCULAR | Status: AC | PRN
  Administered 2013-01-05: 1 mg via INTRAVENOUS

## 2013-01-05 MED ORDER — ALTEPLASE 2 MG IJ SOLR
2.0000 mg | Freq: Once | INTRAMUSCULAR | Status: DC | PRN
  Filled 2013-01-05: qty 2

## 2013-01-05 MED ORDER — DEXTROSE 5 % IV SOLN
1.0000 g | INTRAVENOUS | Status: AC
  Administered 2013-01-05 – 2013-01-08 (×4): 1 g via INTRAVENOUS
  Filled 2013-01-05 (×4): qty 10

## 2013-01-05 NOTE — H&P (Signed)
[name] is an 79 y.o. female.   Chief Complaint: Worsening renal functions- 79/8.6 Oliguric x 2-3 days Wbc 12.1; afeb Need for urgent dialysis  MD ordered and family agree to tunneled HD cath [facility] for recovery May not pursue permanent dialysis if needed HPI: CAD; HTN; HLD; IDDM; CHF; renal failure  Past Medical History  Diagnosis Date  . Coronary artery disease   . Renal artery stenosis   . Hypertension   . Dyslipidemia   . GERD (gastroesophageal reflux disease)   . Tension headache   . Abdominal pain   . Anemia, unspecified   . Gout   . Back pain, chronic   . History of tonsillectomy   . History of cholecystectomy   . Depression   . IDDM (insulin dependent diabetes mellitus)   . Diabetes mellitus   . CHF (congestive heart failure)     Past Surgical History  Procedure Laterality Date  . Tonsillectomy    . Cholecystectomy      Family History  Problem Relation Age of Onset  . Coronary artery disease Father   . Hypertension Father   . Heart attack Father   . Hyperlipidemia Father   . Diabetes Sister   . Hypertension Sister   . Hyperlipidemia Sister   . Hypertension Brother   . Diabetes Brother   . Hyperlipidemia Brother   . Cancer Neg Hx     Breast and colon   Social History:  reports that she has never smoked. She does not have any smokeless tobacco history on file. She reports that she does not drink alcohol or use illicit drugs.  Allergies: No Known Allergies  Medications Prior to Admission  Medication Sig Dispense Refill  . allopurinol (ZYLOPRIM) 300 MG tablet Take 300 mg by mouth daily.        Marland Kitchen amLODipine (NORVASC) 5 MG tablet Take 5 mg by mouth daily.      Marland Kitchen aspirin 81 MG tablet Take 81 mg by mouth daily.        Marland Kitchen atropine 1 % ophthalmic solution Place 1 drop into the left eye 2 (two) times daily.      . bimatoprost (LUMIGAN) 0.03 % ophthalmic solution Place 1 drop into the left eye daily.       . citalopram (CELEXA) 20 MG tablet Take 20 mg  by mouth daily.        . colchicine 0.6 MG tablet Take 0.6 mg by mouth 2 (two) times daily. Takes 1-2 tablets daily as needed for gout flareup.      . diazepam (VALIUM) 5 MG tablet Take 5 mg by mouth every 6 (six) hours as needed for anxiety.      . Difluprednate (DUREZOL) 0.05 % EMUL Apply 1 drop to eye 4 (four) times daily.      . dorzolamide-timolol (COSOPT) 22.3-6.8 MG/ML ophthalmic solution Place 1 drop into both eyes 2 (two) times daily.      . ergocalciferol (VITAMIN D2) 50000 UNITS capsule Take 50,000 Units by mouth once a week.        . furosemide (LASIX) 80 MG tablet Take 80 mg by mouth daily.      Marland Kitchen gabapentin (NEURONTIN) 600 MG tablet Take 1 tablet (600 mg total) by mouth 3 (three) times daily.  90 tablet  2  . hydrALAZINE (APRESOLINE) 100 MG tablet Take 100 mg by mouth 3 (three) times daily.      . insulin glargine (LANTUS) 100 UNIT/ML injection Inject 6 Units into  the skin at bedtime.      . insulin lispro (HUMALOG) 100 UNIT/ML injection Inject 4 Units into the skin 3 (three) times daily before meals.      . metoprolol tartrate (LOPRESSOR) 25 MG tablet Take 75 mg by mouth 2 (two) times daily.      . nitroGLYCERIN (NITROSTAT) 0.4 MG SL tablet Place 0.4 mg under the tongue as needed. For chest pain.       . pantoprazole (PROTONIX) 40 MG tablet Take 40 mg by mouth daily.      . pravastatin (PRAVACHOL) 80 MG tablet Take 80 mg by mouth at bedtime.        . sodium chloride (MURO 128) 5 % ophthalmic solution Place 1 drop into the left eye 2 (two) times daily.        Results for orders placed during the hospital encounter of 12/30/12 (from the past 48 hour(s))  CREATININE, URINE, RANDOM     Status: None   Collection Time    01/03/13 11:41 AM      Result Value Range   Creatinine, Urine 191.40    GLUCOSE, CAPILLARY     Status: Abnormal   Collection Time    01/03/13 11:55 AM      Result Value Range   Glucose-Capillary 183 (*) 70 - 99 mg/dL   Comment 1 Documented in Chart     Comment  2 Notify RN    GLUCOSE, CAPILLARY     Status: Abnormal   Collection Time    01/03/13  4:36 PM      Result Value Range   Glucose-Capillary 163 (*) 70 - 99 mg/dL   Comment 1 Notify RN    SODIUM, URINE, RANDOM     Status: None   Collection Time    01/03/13  4:50 PM      Result Value Range   Sodium, Ur 14    URINALYSIS, ROUTINE W REFLEX MICROSCOPIC     Status: Abnormal   Collection Time    01/03/13  4:50 PM      Result Value Range   Color, Urine ORANGE (*) YELLOW   Comment: BIOCHEMICALS MAY BE AFFECTED BY COLOR   APPearance CLOUDY (*) CLEAR   Specific Gravity, Urine 1.028  1.005 - 1.030   pH 5.0  5.0 - 8.0   Glucose, UA 100 (*) NEGATIVE mg/dL   Hgb urine dipstick NEGATIVE  NEGATIVE   Bilirubin Urine MODERATE (*) NEGATIVE   Ketones, ur 15 (*) NEGATIVE mg/dL   Protein, ur NEGATIVE  NEGATIVE mg/dL   Urobilinogen, UA 0.2  0.0 - 1.0 mg/dL   Nitrite NEGATIVE  NEGATIVE   Leukocytes, UA MODERATE (*) NEGATIVE  CREATININE, URINE, RANDOM     Status: None   Collection Time    01/03/13  4:50 PM      Result Value Range   Creatinine, Urine 259.52    URINE MICROSCOPIC-ADD ON     Status: None   Collection Time    01/03/13  4:50 PM      Result Value Range   Squamous Epithelial / LPF RARE  RARE   WBC, UA 0-2  <3 WBC/hpf   Bacteria, UA RARE  RARE   Urine-Other LESS THAN 10 mL OF URINE SUBMITTED     Comment: MICROSCOPIC EXAM PERFORMED ON UNCONCENTRATED URINE  GLUCOSE, CAPILLARY     Status: Abnormal   Collection Time    01/03/13  8:04 PM      Result Value Range  Glucose-Capillary 186 (*) 70 - 99 mg/dL  GLUCOSE, CAPILLARY     Status: Abnormal   Collection Time    01/04/13 12:00 AM      Result Value Range   Glucose-Capillary 172 (*) 70 - 99 mg/dL  GLUCOSE, CAPILLARY     Status: Abnormal   Collection Time    01/04/13  3:52 AM      Result Value Range   Glucose-Capillary 185 (*) 70 - 99 mg/dL   Comment 1 Notify RN    RENAL FUNCTION PANEL     Status: Abnormal   Collection Time     01/04/13  5:35 AM      Result Value Range   Sodium 139  135 - 145 mEq/L   Potassium 4.5  3.5 - 5.1 mEq/L   Chloride 100  96 - 112 mEq/L   CO2 20  19 - 32 mEq/L   Glucose, Bld 170 (*) 70 - 99 mg/dL   BUN 61 (*) 6 - 23 mg/dL   Creatinine, Ser 5.28 (*) 0.50 - 1.10 mg/dL   Calcium 8.9  8.4 - 41.3 mg/dL   Phosphorus 8.9 (*) 2.3 - 4.6 mg/dL   Albumin 3.4 (*) 3.5 - 5.2 g/dL   GFR calc non Af Amer 4 (*) >90 mL/min   GFR calc Af Amer 5 (*) >90 mL/min   Comment:            The eGFR has been calculated     using the CKD EPI equation.     This calculation has not been     validated in all clinical     situations.     eGFR's persistently     <90 mL/min signify     possible Chronic Kidney Disease.  GLUCOSE, CAPILLARY     Status: Abnormal   Collection Time    01/04/13  8:56 AM      Result Value Range   Glucose-Capillary 162 (*) 70 - 99 mg/dL   Comment 1 Documented in Chart     Comment 2 Notify RN    GLUCOSE, CAPILLARY     Status: Abnormal   Collection Time    01/04/13 11:17 AM      Result Value Range   Glucose-Capillary 187 (*) 70 - 99 mg/dL   Comment 1 Documented in Chart     Comment 2 Notify RN    GLUCOSE, CAPILLARY     Status: Abnormal   Collection Time    01/04/13  4:20 PM      Result Value Range   Glucose-Capillary 187 (*) 70 - 99 mg/dL   Comment 1 Documented in Chart     Comment 2 Notify RN    GLUCOSE, CAPILLARY     Status: Abnormal   Collection Time    01/04/13  8:27 PM      Result Value Range   Glucose-Capillary 189 (*) 70 - 99 mg/dL  GLUCOSE, CAPILLARY     Status: Abnormal   Collection Time    01/05/13 12:52 AM      Result Value Range   Glucose-Capillary 186 (*) 70 - 99 mg/dL  GLUCOSE, CAPILLARY     Status: Abnormal   Collection Time    01/05/13  4:14 AM      Result Value Range   Glucose-Capillary 193 (*) 70 - 99 mg/dL  RENAL FUNCTION PANEL     Status: Abnormal   Collection Time    01/05/13  5:45 AM      Result  Value Range   Sodium 138  135 - 145 mEq/L    Potassium 5.3 (*) 3.5 - 5.1 mEq/L   Chloride 101  96 - 112 mEq/L   CO2 16 (*) 19 - 32 mEq/L   Glucose, Bld 217 (*) 70 - 99 mg/dL   BUN 79 (*) 6 - 23 mg/dL   Creatinine, Ser 9.14 (*) 0.50 - 1.10 mg/dL   Calcium 9.1  8.4 - 78.2 mg/dL   Phosphorus 9.1 (*) 2.3 - 4.6 mg/dL   Albumin 3.3 (*) 3.5 - 5.2 g/dL   GFR calc non Af Amer 4 (*) >90 mL/min   GFR calc Af Amer 4 (*) >90 mL/min   Comment:            The eGFR has been calculated     using the CKD EPI equation.     This calculation has not been     validated in all clinical     situations.     eGFR's persistently     <90 mL/min signify     possible Chronic Kidney Disease.  CBC     Status: Abnormal   Collection Time    01/05/13  5:45 AM      Result Value Range   WBC 12.1 (*) 4.0 - 10.5 K/uL   RBC 3.82 (*) 3.87 - 5.11 MIL/uL   Hemoglobin 10.5 (*) 12.0 - 15.0 g/dL   HCT 95.6 (*) 21.3 - 08.6 %   MCV 83.0  78.0 - 100.0 fL   MCH 27.5  26.0 - 34.0 pg   MCHC 33.1  30.0 - 36.0 g/dL   RDW 57.8 (*) 46.9 - 62.9 %   Platelets 139 (*) 150 - 400 K/uL  GLUCOSE, CAPILLARY     Status: Abnormal   Collection Time    01/05/13  7:57 AM      Result Value Range   Glucose-Capillary 203 (*) 70 - 99 mg/dL   Dg Chest Port 1 View  01/04/2013  *RADIOLOGY REPORT*  Clinical Data: Shortness of breath.  PORTABLE CHEST - 1 VIEW  Comparison: Chest x-ray 06/04/2011.  Findings: Lung volumes are low.  There are extensive midlung and bibasilar opacities which may reflect areas of atelectasis and/or consolidation.  Probable small bilateral pleural effusions. Pulmonary venous congestion.  Slight indistinctness of interstitial markings.  Mild cardiomegaly. The patient is rotated to the left on today's exam, resulting in distortion of the mediastinal contours and reduced diagnostic sensitivity and specificity for mediastinal pathology.  Atherosclerosis in the thoracic aorta.  IMPRESSION: 1.  Overall, the appearance of chest as detailed above is most suggestive of congestive  heart failure.  However, the possibility of superimposed multilobar pneumonia or sequelae of aspiration is not excluded given the appearance.  Clinical correlation is recommended.   Original Report Authenticated By: Trudie Reed, M.D.     Review of Systems  Constitutional: Negative for fever.  Respiratory: Positive for shortness of breath.   Gastrointestinal: Negative for nausea and vomiting.  Neurological: Positive for weakness.  Psychiatric/Behavioral:       AMS    Blood pressure 136/36, pulse 58, temperature 98.1 F (36.7 C), temperature source Axillary, resp. rate 16, height 5\' 5"  (1.651 m), weight 207 lb 10.8 oz (94.2 kg), SpO2 94.00%. Physical Exam  Cardiovascular: Normal rate, regular rhythm and normal heart sounds.   No murmur heard. Respiratory: Effort normal. She has wheezes.  GI: Soft. Bowel sounds are normal. She exhibits distension. There is no tenderness.  Neurological:  Pt  groggy and lethargic; barely responding  Skin: Skin is warm.     Assessment/Plan Worsening renal failure Oliguric Scheduled for tunneled HD cath now pts family aware of procedure benefits and risks and agreeable to proceed Consent to be signed by dtr (RN knows consent is in front of chart) Will hold Hep inj rest of day   [name] A 01/05/2013, 11:10 AM

## 2013-01-05 NOTE — Progress Notes (Signed)
Physical Therapy Treatment Patient Details Name: MAURISHA MONGEAU MRN: [id number] DOB: [birth year] Today's Date: 01/05/2013 Time: 9562-1308 PT Time Calculation (min): 24 min  PT Assessment / Plan / Recommendation Comments on Treatment Session  pt presents with UTI and N/V.  pt with decreased level of arousal affecting participation in mobility.      Follow Up Recommendations  SNF     Does the patient have the potential to tolerate intense rehabilitation     Barriers to Discharge        Equipment Recommendations  None recommended by PT    Recommendations for Other Services    Frequency Min 2X/week   Plan Discharge plan remains appropriate;Frequency needs to be updated    Precautions / Restrictions Precautions Precautions: Fall Restrictions Weight Bearing Restrictions: No   Pertinent Vitals/Pain Does not indicate pain.      Mobility  Bed Mobility Bed Mobility: Supine to Sit;Sitting - Scoot to [place] of Bed;Sit to Supine;Scooting to [facility] Supine to Sit: 1: +2 Total assist Supine to Sit: Patient Percentage: 0% Sitting - Scoot to Edge of Bed: 1: +2 Total assist Sitting - Scoot to Edge of Bed: Patient Percentage: 0% Sit to Supine: 1: +2 Total assist Sit to Supine: Patient Percentage: 10% Scooting to HOB: 1: +2 Total assist Scooting to [facility]: Patient Percentage: 0% Details for Bed Mobility Assistance: pt with very limited participation due to arousal level.   Transfers Transfers: Not assessed Ambulation/Gait Ambulation/Gait Assistance: Not tested (comment) Stairs: No Wheelchair Mobility Wheelchair Mobility: No    Exercises     PT Diagnosis:    PT Problem List:   PT Treatment Interventions:     PT Goals Acute Rehab PT Goals Time For Goal Achievement: 01/07/13 Potential to Achieve Goals: Fair PT Goal: Supine/Side to Sit - Progress: Progressing toward goal PT Goal: Sit to Supine/Side - Progress: Progressing toward goal  Visit Information  Last PT Received On:  01/05/13 Assistance Needed: +2    Subjective Data  Subjective: pt with only minimal responses while sitting and no verbalizations in supine.     Cognition  Cognition Overall Cognitive Status: Difficult to assess Difficult to assess due to: Level of arousal    Balance  Balance Balance Assessed: Yes Static Sitting Balance Static Sitting - Balance Support: Bilateral upper extremity supported;Feet supported Static Sitting - Level of Assistance: 4: Min assist;3: Mod assist Static Sitting - Comment/# of Minutes: pt sat EOB x with fluctuating levels of arousal and A level required to maintain balance.  pt follows a few simple directions once sitting, but needs constant stimulation to maintain arousal level.  pt with posterior/right jerking motion intermittantly while sitting.    End of Session PT - End of Session Equipment Utilized During Treatment: Oxygen Activity Tolerance: Treatment limited secondary to medical complications (Comment) (Decreased arousal.) Patient left: in bed;with call bell/phone within reach;with nursing in room Nurse Communication: Mobility status   GP     Sunny Schlein, [place] [phone] 01/05/2013, 9:37 AM

## 2013-01-05 NOTE — Progress Notes (Signed)
Subjective: Appreciate Dr. Jon Gills help. Code status noted as well as plans for short term HD if needed.  Patient is very hard to awaken - with sternal rub she will arouse briefly.   Objective: Lab: Lab Results  Component Value Date   WBC 5.5 12/31/2012   HGB 12.3 12/31/2012   HCT 37.8 12/31/2012   MCV 83.1 12/31/2012   PLT 150 12/31/2012   BMET    Component Value Date/Time   NA 139 01/04/2013 0535   K 4.5 01/04/2013 0535   CL 100 01/04/2013 0535   CO2 20 01/04/2013 0535   GLUCOSE 170* 01/04/2013 0535   BUN 61* 01/04/2013 0535   CREATININE 7.53* 01/04/2013 0535   CALCIUM 8.9 01/04/2013 0535   GFRNONAA 4* 01/04/2013 0535   GFRAA 5* 01/04/2013 0535     Imaging: cxr 3/24 - IMPRESSION:  1. Overall, the appearance of chest as detailed above is most  suggestive of congestive heart failure. However, the possibility  of superimposed multilobar pneumonia or sequelae of aspiration is  not excluded given the appearance. Clinical correlation is  recommended.  Scheduled Meds: . allopurinol  200 mg Oral Daily  . aspirin EC  81 mg Oral Daily  . atorvastatin  20 mg Oral q1800  . atropine  1 drop Left Eye BID  . bimatoprost  1 drop Left Eye Daily  . citalopram  20 mg Oral Daily  . Difluprednate  1 drop Ophthalmic QID  . dorzolamide-timolol  1 drop Both Eyes BID  . feeding supplement  1 Container Oral BID BM  . heparin  5,000 Units Subcutaneous Q8H  . insulin aspart  0-9 Units Subcutaneous Q4H  . insulin glargine  6 Units Subcutaneous QHS  . polyvinyl alcohol  1 drop Left Eye BID   Continuous Infusions: . sodium chloride 125 mL/hr at 01/04/13 1639   PRN Meds:.ondansetron (ZOFRAN) IV   Physical Exam: Filed Vitals:   01/05/13 0545  BP: 136/36  Pulse: 58  Temp: 98.1 F (36.7 C)  Resp: 16    Intake/Output Summary (Last 24 hours) at 01/05/13 0650 Last data filed at 01/04/13 1639  Gross per 24 hour  Intake 1706.25 ml  Output     50 ml  Net 1656.25 ml   Gen'l=- elderly AA  woman who is not arousable except with sternal rub HEENT- red lips, C&S clear Cor - RRR, III/VI systolic mm best at RSB Pulm - slow respirations - 12, no rales, no wheezing, no increased WOB Abd- protuberant, BS+ x 4, no guarding or rebound Ext- no pitting edema, chronic stasis skin changes Neuro - not arousable     Assessment/Plan: 1. ID - off antibiotics no fever, WBC 3/24 nl  2. GI - poor intake. Normal exam of abdomen  3. DM   CBG (last 3)   Recent Labs  01/04/13 2027 01/05/13 0052 01/05/13 0414  GLUCAP 189* 186* 193*    Continue slidding scale  4. HTN - BP is stable  5. Renal - remains anuric. AM labs are pending.   Plan  Per Nephrology - for short term HD if needed.  6. Cardiac - ahead on fluids. CXR c/w pulmonary edema. No clinical evidence to suggest pneumonia.  Plan Decrease IVF to [facility]  HD to mobilize fluid with f/u CXR   Illene Regulus [facility] (o) [phone]; (c) [phone] Call-grp - [facility]  Tele: [phone]  01/05/2013, 6:40 AM

## 2013-01-05 NOTE — Clinical Social Work Psychosocial (Signed)
Clinical Social Work Department BRIEF PSYCHOSOCIAL ASSESSMENT 01/05/2013  Patient:  [name], [name]     Account Number:  [id number]     Admit date:  12/30/2012  Clinical Social Worker:  Delmer Islam  Date/Time:  01/05/2013 02:51 PM  Referred by:  Physician  Date Referred:  01/04/2013 Referred for  SNF Placement   Other Referral:   Interview type:  Family Other interview type:   Patient's Daughter: [name] [phone])    PSYCHOSOCIAL DATA Living Status:  WITH ADULT CHILDREN Admitted from facility:   Level of care:   Primary support name:  [name] Primary support relationship to patient:  CHILD, ADULT Degree of support available:   Patient's daughter [name] and grandson [name] at beside. Family appears to show a great deal of concern for the patient's heatlh and plans for discharge.    CURRENT CONCERNS Current Concerns  Post-Acute Placement   Other Concerns:    SOCIAL WORK ASSESSMENT / PLAN CSW intern advised by MD to speak with the patient's family about SNF placement. CSW intern introduced self and acknowledged the patient. Patient appeared to be sleep with no interaction with CSW intern. Daughter [name] and [name] at bedside. CSW intern spoke with patient's daughter about discharge plans and informed her of MD recommendation for SNF placement. Patient's daughter is agreeable to short term rehab for the patient and advised CSW intern to seek placement in [facility]. Daughter expressed to CSW intern that the patient has previously been at [facility] for short term rehab however, she does not want the patient to return to this facility. No facility preferences provided at this time. CSW intern informed the patient's daughter that CSW will send the patient's information to facilities in [facility]. CSW intern will then follow up to provide facility responses for the patient.   Assessment/plan status:  Psychosocial Support/Ongoing  Assessment of Needs Other assessment/ plan:   CSW intern will follow up with patient and family to provide bed offers.   Information/referral to community resources:   CSW intern provided the patient's daughter with a list of SNF in [place].    PATIENT'S/FAMILY'S RESPONSE TO PLAN OF CARE: Family was very appreciative of the services provided by CSW intern.     Fernande Boyden, Social Work Intern 01/05/2013

## 2013-01-05 NOTE — Progress Notes (Signed)
Utilization review completed.  

## 2013-01-05 NOTE — Procedures (Signed)
Successful placement of tunneled HD catheter with tips terminating within the superior aspect of the right atrium.   The patient tolerated the procedure well without immediate post procedural complication. The catheter is ready for immediate use.  

## 2013-01-05 NOTE — Clinical Social Work Placement (Addendum)
Clinical Social Work Department CLINICAL SOCIAL WORK PLACEMENT NOTE 01/05/2013  Patient:  [name], [name]  Account Number:  [id number] Admit date:  12/30/2012  Clinical Social Worker:  [name] Bal, LCSW  Date/time:  01/05/2013 03:16 PM  Clinical Social Work is seeking post-discharge placement for this patient at the following level of care:   SKILLED NURSING   (*CSW will update this form in Epic as items are completed)   01/05/2013  Patient/family provided with [facility] Department of Clinical Social Work's list of facilities offering this level of care within the geographic area requested by the patient (or if unable, by the patient's family).  01/05/2013  Patient/family informed of their freedom to choose among providers that offer the needed level of care, that participate in Medicare, Medicaid or managed care program needed by the patient, have an available bed and are willing to accept the patient.    Patient/family informed of [facility]' ownership interest in [facility], as well as of the fact that they are under no obligation to receive care at this facility.  PASARR submitted to EDS on [id number] PASARR number received from EDS on [id number]  FL2 transmitted to all facilities in geographic area requested by pt/family on   FL2 transmitted to all facilities within larger geographic area on   Patient informed that his/her managed care company has contracts with or will negotiate with  certain facilities, including the following:     Patient/family informed of bed offers received: 01/07/13  Patient chooses bed at [facility] Physician recommends and patient chooses bed at    Patient to be transferred to [facility] on 01/13/13 - family aware of discharge. Patient to be transferred to facility by ambulance  The following physician request were entered in Epic:   Additional Comments: 01/07/13 - CSW Intern Fernande Boyden gave offers to daughter at the bedside and  daughter Bradie Sangiovanni by phone. 01/11/13: CSW contacted [facility] after talking with daughter Pragya Lofaso for SNF decision and [facility] can they take patient. Probable d/c date per MD is Tuesday, 01/12/13. 01/12/13: Patient is medically stable for discharge today, 01/12/2013. Facility notified and family contacted to complete paperwork at the facility. 01/12/2013:  Discharge delayed. Patient and family informed. Discharge summary dictated by MD but is not yet available. Facility deadline for discharge summary was 4:00pm.    Fernande Boyden, Social Work Intern 01/05/2013

## 2013-01-05 NOTE — Progress Notes (Signed)
[facility] - PROGRESS NOTE Resident Note   Please see below for attending addendum to resident note.  Subjective:   79 year old BF not arousable to exam this morning. Afebrile. Diastolic pressures low (36-51). Not much UOP. BUN 79, Cr 8.59 (up from 7.53 yesterday), both continue to rise. Weight continues to rise.  Need to have PC placed and start HD today, family [name]) aware  Objective:    Vital Signs:   Temp:  [97.9 F (36.6 C)-98.1 F (36.7 C)] 98.1 F (36.7 C) (03/25 0545) Pulse Rate:  [49-58] 58 (03/25 0545) Resp:  [16-20] 16 (03/25 0545) BP: (114-136)/(36-51) 136/36 mmHg (03/25 0545) SpO2:  [93 %-95 %] 94 % (03/25 0545) Weight:  [94.2 kg (207 lb 10.8 oz)] 94.2 kg (207 lb 10.8 oz) (03/24 2029) Last BM Date: 01/01/13  24-hour weight change: Weight change:   Weight trends: Filed Weights   12/31/12 0234 01/01/13 2200 01/04/13 2029  Weight: 87.635 kg (193 lb 3.2 oz) 90.4 kg (199 lb 4.7 oz) 94.2 kg (207 lb 10.8 oz)    Intake/Output:  03/24 0701 - 03/25 0700 In: 2206.3 [I.V.:2206.3] Out: 50 [Urine:50]  Physical Exam: General: Vital signs reviewed and noted. Well-developed, well-nourished, in no acute distress; obtunded on exam  Neck No JVD appreciated  Lungs:  Difficult exam- diminished breath sounds, no crackles or wheezes.  Heart: Distant heart sounds; RRR. II/VI systolic murmur best hear at RSB.   Abdomen:  BS normoactive. Soft, Nondistended, non-tender.  No masses or organomegaly.  Extremities: Pretibial hyperpigmentation BL, no edema.      Labs: Basic Metabolic Panel:  Recent Labs Lab 01/03/13 0440 01/04/13 0535 01/05/13 0545  NA 137 139 138  K 4.4 4.5 5.3*  CL 97 100 101  CO2 22 20 16*  GLUCOSE 168* 170* 217*  BUN 52* 61* 79*  CREATININE 6.24* 7.53* 8.59*  CALCIUM 9.5 8.9 9.1  PHOS  --  8.9* 9.1*    Liver Function Tests:  Recent Labs Lab 12/30/12 2054 01/04/13 0535 01/05/13 0545  AST 28  --   --   ALT 16  --   --    ALKPHOS 123*  --   --   BILITOT 0.5  --   --   PROT 7.3  --   --   ALBUMIN 3.9 3.4* 3.3*    Recent Labs Lab 12/30/12 2054  LIPASE 143*   No results found for this basename: AMMONIA,  in the last 168 hours  CBC:  Recent Labs Lab 12/30/12 2103 12/31/12 0440 01/05/13 0545  WBC 5.8 5.5 12.1*  HGB 13.3 12.3 10.5*  HCT 39.8 37.8 31.7*  MCV 82.2 83.1 83.0  PLT 145* 150 139*    Cardiac Enzymes:  Recent Labs Lab 12/31/12 0114 01/03/13 0440  CKTOTAL  --  101  TROPONINI <0.30  --     BNP: No components found with this basename: POCBNP,   CBG:  Recent Labs Lab 01/04/13 1620 01/04/13 2027 01/05/13 0052 01/05/13 0414 01/05/13 0757  GLUCAP 187* 189* 186* 193* 203*    Microbiology: Results for orders placed during the hospital encounter of 12/30/12  URINE CULTURE     Status: None   Collection Time    12/30/12 10:12 PM      Result Value Range Status   Specimen Description URINE, CATHETERIZED   Final   Special Requests NONE   Final   Culture  Setup Time 12/30/2012 23:10   Final   Colony Count >=100,000 COLONIES/ML  Final   Culture ESCHERICHIA COLI   Final   Report Status 01/02/2013 FINAL   Final   Organism ID, Bacteria ESCHERICHIA COLI   Final    Coagulation Studies: No results found for this basename: LABPROT, INR,  in the last 72 hours  Urinalysis:    Component Value Date/Time   COLORURINE ORANGE* 01/03/2013 1650   APPEARANCEUR CLOUDY* 01/03/2013 1650   LABSPEC 1.028 01/03/2013 1650   PHURINE 5.0 01/03/2013 1650   GLUCOSEU 100* 01/03/2013 1650   HGBUR NEGATIVE 01/03/2013 1650   BILIRUBINUR MODERATE* 01/03/2013 1650   KETONESUR 15* 01/03/2013 1650   PROTEINUR NEGATIVE 01/03/2013 1650   UROBILINOGEN 0.2 01/03/2013 1650   NITRITE NEGATIVE 01/03/2013 1650   LEUKOCYTESUR MODERATE* 01/03/2013 1650     Imaging: Dg Chest Port 1 View  01/04/2013  *RADIOLOGY REPORT*  Clinical Data: Shortness of breath.  PORTABLE CHEST - 1 VIEW  Comparison: Chest x-ray  06/04/2011.  Findings: Lung volumes are low.  There are extensive midlung and bibasilar opacities which may reflect areas of atelectasis and/or consolidation.  Probable small bilateral pleural effusions. Pulmonary venous congestion.  Slight indistinctness of interstitial markings.  Mild cardiomegaly. The patient is rotated to the left on today's exam, resulting in distortion of the mediastinal contours and reduced diagnostic sensitivity and specificity for mediastinal pathology.  Atherosclerosis in the thoracic aorta.  IMPRESSION: 1.  Overall, the appearance of chest as detailed above is most suggestive of congestive heart failure.  However, the possibility of superimposed multilobar pneumonia or sequelae of aspiration is not excluded given the appearance.  Clinical correlation is recommended.   Original Report Authenticated By: Trudie Reed, M.D.       Medications:    Infusions: . sodium chloride 125 mL/hr at 01/05/13 0700    Scheduled Medications: . allopurinol  200 mg Oral Daily  . aspirin EC  81 mg Oral Daily  . atorvastatin  20 mg Oral q1800  . atropine  1 drop Left Eye BID  . bimatoprost  1 drop Left Eye Daily  . citalopram  20 mg Oral Daily  . Difluprednate  1 drop Ophthalmic QID  . dorzolamide-timolol  1 drop Both Eyes BID  . feeding supplement  1 Container Oral BID BM  . heparin  5,000 Units Subcutaneous Q8H  . insulin aspart  0-9 Units Subcutaneous Q4H  . insulin glargine  6 Units Subcutaneous QHS  . polyvinyl alcohol  1 drop Left Eye BID    PRN Medications: ondansetron (ZOFRAN) IV   Assessment/ Plan:    Pt is a 79 y.o. yo female with a PMHx of CKD, HTN, DM II, gout who was admitted on 12/30/2012 for abdominal/flank pain and UTI. Renal service is consulted for worsening Cr.  1) A/C renal failure. Thought maybe due to hypotension vs. UTI. Pt is still oliguric and BUN/Cr continue to rise, likely with encephalopathy. Pt does not have acute lab indications for HD but MS is  likely due to uremia. She is scheduled to have perm cath placed for short term HD, HD today and tomorrow for clearance. If the patient does not recover, the family has agreed that she would not opt for chronic HD therapy as it would alter her lifestyle too much.  2) HTN/vol- overloaded, anuric/oliguric- original thought is that she may be dry.  Will keep on the overloaded side to assist with eventual renal recovery 3) Anemia- had not been issue but now is hydrated, hgb 10.5.  Will watch closely for ESA need 4)  UTI- will resume ceftriaxone now that culture is back and UTI may not be fully treated 5) Dispo- have spoken with family, they agree to short term HD support but likely think pt would not want chronic HD support.    Length of Stay: 6 days  Patient history and plan of care reviewed with attending, Dr. Annie Sable.   Winnifred Friar, Student-PA  01/05/2013, 10:23 AM  Patient seen and examined, agree with above note with above modifications. Unfortunately pt remains oliguric and labs continue to worsen.  Did speak with family last night and they desire at least short term HD support so I have called IR to get PC followed by HD today, next treatment tomorrow Annie Sable, MD 01/05/2013

## 2013-01-06 ENCOUNTER — Inpatient Hospital Stay (HOSPITAL_COMMUNITY): Payer: Medicare Other

## 2013-01-06 LAB — CBC
Hemoglobin: 9.6 g/dL — ABNORMAL LOW (ref 12.0–15.0)
MCH: 27.6 pg (ref 26.0–34.0)
MCHC: 33 g/dL (ref 30.0–36.0)
Platelets: 148 10*3/uL — ABNORMAL LOW (ref 150–400)
RDW: 20.5 % — ABNORMAL HIGH (ref 11.5–15.5)

## 2013-01-06 LAB — HEPATITIS B SURFACE ANTIBODY,QUALITATIVE: Hep B S Ab: NONREACTIVE

## 2013-01-06 LAB — RENAL FUNCTION PANEL
Albumin: 3 g/dL — ABNORMAL LOW (ref 3.5–5.2)
Chloride: 102 mEq/L (ref 96–112)
GFR calc Af Amer: 6 mL/min — ABNORMAL LOW (ref >90)
GFR calc non Af Amer: 5 mL/min — ABNORMAL LOW (ref >90)
Potassium: 4.5 mEq/L (ref 3.5–5.1)
Sodium: 139 mEq/L (ref 135–145)

## 2013-01-06 LAB — HEPATITIS B SURFACE ANTIGEN: Hepatitis B Surface Ag: NEGATIVE

## 2013-01-06 LAB — GLUCOSE, CAPILLARY
Glucose-Capillary: 141 mg/dL — ABNORMAL HIGH (ref 70–99)
Glucose-Capillary: 141 mg/dL — ABNORMAL HIGH (ref 70–99)
Glucose-Capillary: 152 mg/dL — ABNORMAL HIGH (ref 70–99)

## 2013-01-06 MED ORDER — DARBEPOETIN ALFA-POLYSORBATE 60 MCG/0.3ML IJ SOLN
60.0000 ug | INTRAMUSCULAR | Status: DC
  Administered 2013-01-07: 60 ug via INTRAVENOUS
  Filled 2013-01-06: qty 0.3

## 2013-01-06 MED FILL — Heparin Sodium (Porcine) Inj 1000 Unit/ML: INTRAMUSCULAR | Qty: 10 | Status: AC

## 2013-01-06 NOTE — Procedures (Signed)
Patient was seen on dialysis just after ended and the procedure was supervised.  BFR 250  Via PC BP is  155/73.   Patient appears to be tolerating treatment well  Eder Macek A 01/06/2013

## 2013-01-06 NOTE — Progress Notes (Signed)
[facility] - PROGRESS NOTE Resident Note   Please see below for attending addendum to resident note.  Subjective:   Pt not arousable to my exam. Lying in bed in HD unit.   Objective:    Vital Signs:   Temp:  [97.5 F (36.4 C)-99.3 F (37.4 C)] 99.3 F (37.4 C) (03/26 0624) Pulse Rate:  [57-75] 67 (03/26 0900) Resp:  [8-18] 16 (03/26 0634) BP: (121-161)/(45-80) 145/80 mmHg (03/26 0900) SpO2:  [96 %-100 %] 96 % (03/26 0624) Weight:  [93.7 kg (206 lb 9.1 oz)-95.3 kg (210 lb 1.6 oz)] 95.3 kg (210 lb 1.6 oz) (03/26 0624) Last BM Date: 01/01/13  24-hour weight change: Weight change: -0.4 kg (-14.1 oz)  Weight trends: Filed Weights   01/05/13 1823 01/05/13 2258 01/06/13 0624  Weight: 93.7 kg (206 lb 9.1 oz) 95.3 kg (210 lb 1.6 oz) 95.3 kg (210 lb 1.6 oz)    Intake/Output:  03/25 0701 - 03/26 0700 In: 50 [IV Piggyback:50] Out: 50 [Urine:50]  Physical Exam: General: Vital signs reviewed and noted. Well-developed, well-nourished, in no acute distress; still obtunded mainly  Lungs:  Normal respiratory effort. Clear to auscultation BL without crackles or wheezes.  Heart: RRR. S1 and S2 normal without gallop, murmur, or rubs.  Abdomen:  BS normoactive. Soft, Nondistended, non-tender.  No masses or organomegaly.  Extremities: 2+ pretibial edema.     Labs: Basic Metabolic Panel:  Recent Labs Lab 01/04/13 0535 01/05/13 0545 01/06/13 0545  NA 139 138 139  K 4.5 5.3* 4.5  CL 100 101 102  CO2 20 16* 23  GLUCOSE 170* 217* 145*  BUN 61* 79* 67*  CREATININE 7.53* 8.59* 6.69*  CALCIUM 8.9 9.1 9.1  PHOS 8.9* 9.1* 7.1*    Liver Function Tests:  Recent Labs Lab 12/30/12 2054 01/04/13 0535 01/05/13 0545 01/06/13 0545  AST 28  --   --   --   ALT 16  --   --   --   ALKPHOS 123*  --   --   --   BILITOT 0.5  --   --   --   PROT 7.3  --   --   --   ALBUMIN 3.9 3.4* 3.3* 3.0*    Recent Labs Lab 12/30/12 2054  LIPASE 143*   No results found for this  basename: AMMONIA,  in the last 168 hours  CBC:  Recent Labs Lab 12/31/12 0440 01/05/13 0545 01/06/13 0639  WBC 5.5 12.1* 10.6*  HGB 12.3 10.5* 9.6*  HCT 37.8 31.7* 29.1*  MCV 83.1 83.0 83.6  PLT 150 139* 148*    Cardiac Enzymes:  Recent Labs Lab 12/31/12 0114 01/03/13 0440  CKTOTAL  --  101  TROPONINI <0.30  --     BNP: No components found with this basename: POCBNP,   CBG:  Recent Labs Lab 01/05/13 0757 01/05/13 1133 01/05/13 2006 01/06/13 0004 01/06/13 0402  GLUCAP 203* 181* 137* 152* 142*    Microbiology: Results for orders placed during the hospital encounter of 12/30/12  URINE CULTURE     Status: None   Collection Time    12/30/12 10:12 PM      Result Value Range Status   Specimen Description URINE, CATHETERIZED   Final   Special Requests NONE   Final   Culture  Setup Time 12/30/2012 23:10   Final   Colony Count >=100,000 COLONIES/ML   Final   Culture ESCHERICHIA COLI   Final   Report Status 01/02/2013 FINAL  Final   Organism ID, Bacteria ESCHERICHIA COLI   Final    Coagulation Studies:  Recent Labs  01/05/13 1210  LABPROT 15.0  INR 1.20    Urinalysis:    Component Value Date/Time   COLORURINE ORANGE* 01/03/2013 1650   APPEARANCEUR CLOUDY* 01/03/2013 1650   LABSPEC 1.028 01/03/2013 1650   PHURINE 5.0 01/03/2013 1650   GLUCOSEU 100* 01/03/2013 1650   HGBUR NEGATIVE 01/03/2013 1650   BILIRUBINUR MODERATE* 01/03/2013 1650   KETONESUR 15* 01/03/2013 1650   PROTEINUR NEGATIVE 01/03/2013 1650   UROBILINOGEN 0.2 01/03/2013 1650   NITRITE NEGATIVE 01/03/2013 1650   LEUKOCYTESUR MODERATE* 01/03/2013 1650     Imaging: Ir Fluoro Guide Cv Line Right  01/05/2013  *RADIOLOGY REPORT*  Indication: End-stage renal disease, in need of intravenous access for initiation of hemodialysis  TUNNELED CENTRAL VENOUS HEMODIALYSIS CATHETER PLACEMENT WITH ULTRASOUND AND FLUOROSCOPIC GUIDANCE  Medications: Versed 2 mg IV; Fentanyl 50 mcg IV; Ancef 1 gm IV; The IV  antibiotic was given in an appropriate time interval prior to skin puncture.  Contrast: None  Total Moderate Sedation Time: 20 minutes.  Fluoroscopy Time: 0.7 minutes.  Complications:  None immediate  Findings / Procedure:  Informed written consent was obtained from the patient's daughter after a discussion of the risks, benefits, and alternatives to treatment.  Questions regarding the procedure were encouraged and answered.  The right neck and chest were prepped with chlorhexidine in a sterile fashion, and a sterile drape was applied covering the operative field.  Maximum barrier sterile technique with sterile gowns and gloves were used for the procedure.  A timeout was performed prior to the initiation of the procedure.  After creating a small venotomy incision, a micropuncture kit was utilized to access the right internal jugular vein under direct, real-time ultrasound guidance after the overlying soft tissues were anesthetized with 1% lidocaine with epinephrine.  Ultrasound image documentation was performed.  The microwire was kinked to measure appropriate catheter length.  A stiff glidewire was advanced to the level of the IVC and the micropuncture sheath was exchanged for a peel-away sheath.  A Hemosplit tunneled hemodialysis catheter measuring 19 cm from tip to cuff was tunneled in a retrograde fashion from the anterior chest wall to the venotomy incision.  The catheter was then placed through the peel-away sheath with tips ultimately positioned within the superior aspect of the right atrium.  Final catheter positioning was confirmed and documented with a spot radiographic image.  The catheter aspirates and flushes normally.  The catheter was flushed with appropriate volume heparin dwells.  The catheter exit site was secured with a 0-Prolene retention suture.  The venotomy incision was closed with an interrupted 4-0 Vicryl, Dermabond and Steri-strips.  Dressings were applied.  The patient tolerated the  procedure well without immediate post procedural complication.  IMPRESSION:  Successful placement of 19 cm tip to cuff tunneled hemodialysis catheter via the right internal jugular vein with tips terminating within the superior aspect of the right atrium.  The catheter is ready for immediate use.   Original Report Authenticated By: Tacey Ruiz, MD    Ir US Guide Vasc Access Right  01/05/2013  *RADIOLOGY REPORT*  Indication: End-stage renal disease, in need of intravenous access for initiation of hemodialysis  TUNNELED CENTRAL VENOUS HEMODIALYSIS CATHETER PLACEMENT WITH ULTRASOUND AND FLUOROSCOPIC GUIDANCE  Medications: Versed 2 mg IV; Fentanyl 50 mcg IV; Ancef 1 gm IV; The IV antibiotic was given in an appropriate time interval prior to skin puncture.  Contrast: None  Total Moderate Sedation Time: 20 minutes.  Fluoroscopy Time: 0.7 minutes.  Complications:  None immediate  Findings / Procedure:  Informed written consent was obtained from the patient's daughter after a discussion of the risks, benefits, and alternatives to treatment.  Questions regarding the procedure were encouraged and answered.  The right neck and chest were prepped with chlorhexidine in a sterile fashion, and a sterile drape was applied covering the operative field.  Maximum barrier sterile technique with sterile gowns and gloves were used for the procedure.  A timeout was performed prior to the initiation of the procedure.  After creating a small venotomy incision, a micropuncture kit was utilized to access the right internal jugular vein under direct, real-time ultrasound guidance after the overlying soft tissues were anesthetized with 1% lidocaine with epinephrine.  Ultrasound image documentation was performed.  The microwire was kinked to measure appropriate catheter length.  A stiff glidewire was advanced to the level of the IVC and the micropuncture sheath was exchanged for a peel-away sheath.  A Hemosplit tunneled hemodialysis catheter  measuring 19 cm from tip to cuff was tunneled in a retrograde fashion from the anterior chest wall to the venotomy incision.  The catheter was then placed through the peel-away sheath with tips ultimately positioned within the superior aspect of the right atrium.  Final catheter positioning was confirmed and documented with a spot radiographic image.  The catheter aspirates and flushes normally.  The catheter was flushed with appropriate volume heparin dwells.  The catheter exit site was secured with a 0-Prolene retention suture.  The venotomy incision was closed with an interrupted 4-0 Vicryl, Dermabond and Steri-strips.  Dressings were applied.  The patient tolerated the procedure well without immediate post procedural complication.  IMPRESSION:  Successful placement of 19 cm tip to cuff tunneled hemodialysis catheter via the right internal jugular vein with tips terminating within the superior aspect of the right atrium.  The catheter is ready for immediate use.   Original Report Authenticated By: Tacey Ruiz, MD       Medications:    Infusions: . sodium chloride 125 mL/hr at 01/05/13 0700    Scheduled Medications: . allopurinol  200 mg Oral Daily  . aspirin EC  81 mg Oral Daily  . atorvastatin  20 mg Oral q1800  . atropine  1 drop Left Eye BID  . bimatoprost  1 drop Left Eye Daily  . cefTRIAXone (ROCEPHIN)  IV  1 g Intravenous Q24H  . citalopram  20 mg Oral Daily  . Difluprednate  1 drop Left Eye QID  . dorzolamide-timolol  1 drop Both Eyes BID  . feeding supplement  1 Container Oral BID BM  . heparin  5,000 Units Subcutaneous Q8H  . insulin aspart  0-9 Units Subcutaneous Q4H  . insulin glargine  6 Units Subcutaneous QHS  . polyvinyl alcohol  1 drop Left Eye BID    PRN Medications: ondansetron (ZOFRAN) IV   Assessment/ Plan:    Pt is a 79 y.o. yo female with a PMHx of CKD, HTN, DM II, Gout who was admitted on 12/30/2012 for abdominal/flnak pain and UTI. Renal service is consulted  for worsening Cr.  1) A/C renal failure. PT currently undergoing HD this morning.  S/P HD 3/25 and 3/26- unfortunately no significant change in MS yet, will plan on another HD treatment tomorrow. Cr is 6.69 (from 8.59 yesterday) and BUN is 67 (from 79 yesterday). Still no signif UOP  2) HTN/vol -  hold IV fluids. Keep slightly overloaded  may help with eventual renal recovery.  3) Anemia - hgb 9.6 (from 10.5 yesterday). Will check iron stores and start aranesp  4) Dispo - pt family will not have chronic HD support; short term HD is ok. We will follow for hopeful recovery.  5) UTI- back on rocephin for E coli   Length of Stay: 7 days  Patient history and plan of care reviewed with attending, Dr. Annie Sable.   Winnifred Friar, Student-PA  01/06/2013, 9:03 AM

## 2013-01-06 NOTE — Progress Notes (Signed)
Subjective: Patient in dialysis. She is more awake than yesterday but still very somnolent and does not answer any questions  Objective: Lab: Lab Results  Component Value Date   WBC 12.1* 01/05/2013   HGB 10.5* 01/05/2013   HCT 31.7* 01/05/2013   MCV 83.0 01/05/2013   PLT 139* 01/05/2013   BMET    Component Value Date/Time   NA 139 01/06/2013 0545   K 4.5 01/06/2013 0545   CL 102 01/06/2013 0545   CO2 23 01/06/2013 0545   GLUCOSE 145* 01/06/2013 0545   BUN 67* 01/06/2013 0545   CREATININE 6.69* 01/06/2013 0545   CALCIUM 9.1 01/06/2013 0545   GFRNONAA 5* 01/06/2013 0545   GFRAA 6* 01/06/2013 0545     Imaging:  Scheduled Meds: . allopurinol  200 mg Oral Daily  . aspirin EC  81 mg Oral Daily  . atorvastatin  20 mg Oral q1800  . atropine  1 drop Left Eye BID  . bimatoprost  1 drop Left Eye Daily  . cefTRIAXone (ROCEPHIN)  IV  1 g Intravenous Q24H  . citalopram  20 mg Oral Daily  . Difluprednate  1 drop Left Eye QID  . dorzolamide-timolol  1 drop Both Eyes BID  . feeding supplement  1 Container Oral BID BM  . heparin  5,000 Units Subcutaneous Q8H  . insulin aspart  0-9 Units Subcutaneous Q4H  . insulin glargine  6 Units Subcutaneous QHS  . polyvinyl alcohol  1 drop Left Eye BID   Continuous Infusions: . sodium chloride 125 mL/hr at 01/05/13 0700   PRN Meds:.ondansetron (ZOFRAN) IV   Physical Exam: Filed Vitals:   01/06/13 0634  BP: 140/68  Pulse: 70  Temp:   Resp: 16    Intake/Output Summary (Last 24 hours) at 01/06/13 0655 Last data filed at 01/05/13 2004  Gross per 24 hour  Intake   1050 ml  Output     50 ml  Net   1000 ml   Gen'l - obese AA woman who is somnolent but does not appear to be in distress HEENT- puffy face, blind OD, OS - hard to visualize Cor- 2+ radial pulse, feet warm, precordium quiet, RRR Pulm - no increased WOB, no rales or wheeze. HD catheter/vascular access right upper chest Abd - obese, BS+, soft, no guarding Neuro - very somnolent but  arousable to verbal stimuli        Assessment/Plan: 1. ID - patient's antibiotics were stopped for 2 days. Ceftriaxone was restarted yesterday. She has elevated WBC but no fever today. Plan Continue ceftiraxone.  2. GI - normal abdominal exam  3. DM -  CBG (last 3)   Recent Labs  01/05/13 2006 01/06/13 0004 01/06/13 0402  GLUCAP 137* 152* 142*    Continue sliding scale  4. HTN - stalbe  5. Renal - prerenal failure. Having second HD treatment. Etiology of acute renal failure not clear. Patient is not a candidate for long term HD. Plan Per nephrology.  6. Cardiac - no evidence of acute failure. CXR without pul edema 3/24 Plan Fluid management per Nephrology   Illene Regulus [place] [facility] (o) [phone]; (c) [phone] Call-grp - P[facility]  Tele: [phone]  01/06/2013, 6:54 AM

## 2013-01-07 DIAGNOSIS — M549 Dorsalgia, unspecified: Secondary | ICD-10-CM

## 2013-01-07 LAB — CBC WITH DIFFERENTIAL/PLATELET
Eosinophils Absolute: 0 10*3/uL (ref 0.0–0.7)
Eosinophils Relative: 0 % (ref 0–5)
HCT: 28.4 % — ABNORMAL LOW (ref 36.0–46.0)
Hemoglobin: 9.5 g/dL — ABNORMAL LOW (ref 12.0–15.0)
Lymphs Abs: 0.5 10*3/uL — ABNORMAL LOW (ref 0.7–4.0)
MCH: 27.1 pg (ref 26.0–34.0)
MCV: 80.9 fL (ref 78.0–100.0)
Monocytes Absolute: 1 10*3/uL (ref 0.1–1.0)
Monocytes Relative: 12 % (ref 3–12)
Platelets: 144 10*3/uL — ABNORMAL LOW (ref 150–400)
RBC: 3.51 MIL/uL — ABNORMAL LOW (ref 3.87–5.11)

## 2013-01-07 LAB — GLUCOSE, CAPILLARY
Glucose-Capillary: 146 mg/dL — ABNORMAL HIGH (ref 70–99)
Glucose-Capillary: 137 mg/dL — ABNORMAL HIGH (ref 70–99)
Glucose-Capillary: 113 mg/dL — ABNORMAL HIGH (ref 70–99)
Glucose-Capillary: 118 mg/dL — ABNORMAL HIGH (ref 70–99)

## 2013-01-07 LAB — RENAL FUNCTION PANEL
BUN: 58 mg/dL — ABNORMAL HIGH (ref 6–23)
Glucose, Bld: 143 mg/dL — ABNORMAL HIGH (ref 70–99)
Phosphorus: 5.6 mg/dL — ABNORMAL HIGH (ref 2.3–4.6)
Potassium: 4.1 mEq/L (ref 3.5–5.1)

## 2013-01-07 LAB — IRON AND TIBC
Saturation Ratios: 46 % (ref 20–55)
UIBC: 112 ug/dL — ABNORMAL LOW (ref 125–400)

## 2013-01-07 MED ORDER — ACETAMINOPHEN 650 MG RE SUPP
650.0000 mg | Freq: Four times a day (QID) | RECTAL | Status: DC | PRN
  Administered 2013-01-07 – 2013-01-08 (×3): 650 mg via RECTAL
  Filled 2013-01-07 (×3): qty 1

## 2013-01-07 MED ORDER — DARBEPOETIN ALFA-POLYSORBATE 60 MCG/0.3ML IJ SOLN
INTRAMUSCULAR | Status: AC
  Filled 2013-01-07: qty 0.3

## 2013-01-07 MED ORDER — HYDRALAZINE HCL 20 MG/ML IJ SOLN
10.0000 mg | INTRAMUSCULAR | Status: DC | PRN
  Filled 2013-01-07: qty 1

## 2013-01-07 MED ORDER — ACETAMINOPHEN 80 MG RE SUPP
80.0000 mg | Freq: Four times a day (QID) | RECTAL | Status: DC | PRN
  Filled 2013-01-07: qty 1

## 2013-01-07 NOTE — Progress Notes (Addendum)
PT's temperature 100.2.Dr. Debby Bud was notified,orders were received.keep monitoring pt. Closely.

## 2013-01-07 NOTE — Progress Notes (Signed)
Subjective: Mrs. Lafortune is very somnolent. She will open her eyes to verbal stimulus. Seems comfortable and in no distress  Objective: Lab: Lab Results  Component Value Date   WBC 10.6* 01/06/2013   HGB 9.6* 01/06/2013   HCT 29.1* 01/06/2013   MCV 83.6 01/06/2013   PLT 148* 01/06/2013   BMET    Component Value Date/Time   NA 139 01/06/2013 0545   K 4.5 01/06/2013 0545   CL 102 01/06/2013 0545   CO2 23 01/06/2013 0545   GLUCOSE 145* 01/06/2013 0545   BUN 67* 01/06/2013 0545   CREATININE 6.69* 01/06/2013 0545   CALCIUM 9.1 01/06/2013 0545   GFRNONAA 5* 01/06/2013 0545   GFRAA 6* 01/06/2013 0545     Imaging:  Scheduled Meds: . allopurinol  200 mg Oral Daily  . aspirin EC  81 mg Oral Daily  . atorvastatin  20 mg Oral q1800  . atropine  1 drop Left Eye BID  . bimatoprost  1 drop Left Eye Daily  . cefTRIAXone (ROCEPHIN)  IV  1 g Intravenous Q24H  . citalopram  20 mg Oral Daily  . darbepoetin (ARANESP) injection - DIALYSIS  60 mcg Intravenous Q Thu-HD  . Difluprednate  1 drop Left Eye QID  . dorzolamide-timolol  1 drop Both Eyes BID  . feeding supplement  1 Container Oral BID BM  . heparin  5,000 Units Subcutaneous Q8H  . insulin aspart  0-9 Units Subcutaneous Q4H  . insulin glargine  6 Units Subcutaneous QHS  . polyvinyl alcohol  1 drop Left Eye BID   Continuous Infusions: . sodium chloride 20 mL/hr at 01/06/13 1700   PRN Meds:.ondansetron (ZOFRAN) IV   Physical Exam: Filed Vitals:   01/07/13 0410  BP: 174/73  Pulse: 80  Temp: 99.3 F (37.4 C)  Resp: 20    Intake/Output Summary (Last 24 hours) at 01/07/13 0750 Last data filed at 01/07/13 0649  Gross per 24 hour  Intake    160 ml  Output   1300 ml  Net  -1140 ml  this adm: +4.4 liters.  Urine output 3/26 300 cc Gen'l- overweight AA woman HEENT- [place]/AT Cor- 2+ radial pulse. Precordium is quiet. RRR. Feet are warm Pulm - no incrased WOB, no rales or wheeze Abd - BS+, soft, no guarding or rebound Exd - no deformity.  Chronic stasis changes both distal LE Neuro - barely arousable, mumbles but it is not intelligible.      Assessment/Plan: 1. ID - Day # 2 Ceftriaxone. WBC down. No fever. Urine culture with E.Coli ss to ceftriaxone Plan - continue antibiotic therapy  2. GI - normal exam. Good BS  3. DM  -  CBG (last 3)   Recent Labs  01/06/13 2001 01/06/13 2352 01/07/13 0408  GLUCAP 128* 141* 146*    Continue sliding scale  4. HTN  Mildly elevated this morning.  Plan Hydralazine prn for SBP > 180  5. Renal - has had HD two days in a row. Remains oliguric Plan Per nephrology  6. Cardiac - stable  Very slow progress. Prognosis remains guarded.    Illene Regulus [facility] (o) [phone]; (c) [phone] Call-grp - [facility]  Tele: [phone]  01/07/2013, 7:45 AM

## 2013-01-07 NOTE — Progress Notes (Signed)
[name] - PROGRESS NOTE Resident Note   Please see below for attending addendum to resident note.  Subjective:   Pt still somnolent- she opens eyes to verbal stimulus but does not respond. Otherwise she appears comfortable. More alert, able to say her name for me.  Had HD Tues and Wed, for third treatment today.  Making more urine !  Objective:    Vital Signs:   Temp:  [97.3 F (36.3 C)-100.2 F (37.9 C)] 100.2 F (37.9 C) (03/27 0852) Pulse Rate:  [69-82] 82 (03/27 0852) Resp:  [16-20] 16 (03/27 0852) BP: (156-174)/(60-73) 169/66 mmHg (03/27 0852) SpO2:  [95 %-100 %] 98 % (03/27 0852) Weight:  [94.6 kg (208 lb 8.9 oz)] 94.6 kg (208 lb 8.9 oz) (03/26 2118) Last BM Date: 01/01/13  24-hour weight change: Weight change: 0.8 kg (1 lb 12.2 oz)  Weight trends: Filed Weights   01/05/13 2258 01/06/13 0624 01/06/13 2118  Weight: 95.3 kg (210 lb 1.6 oz) 95.3 kg (210 lb 1.6 oz) 94.6 kg (208 lb 8.9 oz)    Intake/Output:  03/26 0701 - 03/27 0700 In: 160 [I.V.:160] Out: 1300 [Urine:300]  Physical Exam: General: Vital signs reviewed and noted. Well-developed, well-nourished, in no acute distress.  Lungs:  Normal respiratory effort. Clear to auscultation BL without crackles or wheezes.  Heart: RRR. S1 and S2 normal without gallop, murmur, or rubs.  Abdomen:  BS normoactive. Soft, Nondistended, non-tender.  No masses or organomegaly.  Extremities: No pretibial edema.     More arousable  Labs: Basic Metabolic Panel:  Recent Labs Lab 01/05/13 0545 01/06/13 0545 01/07/13 0630  NA 138 139 140  K 5.3* 4.5 4.1  CL 101 102 102  CO2 16* 23 22  GLUCOSE 217* 145* 143*  BUN 79* 67* 58*  CREATININE 8.59* 6.69* 4.46*  CALCIUM 9.1 9.1 9.2  PHOS 9.1* 7.1* 5.6*    Liver Function Tests:  Recent Labs Lab 01/05/13 0545 01/06/13 0545 01/07/13 0630  ALBUMIN 3.3* 3.0* 2.8*   No results found for this basename: LIPASE, AMYLASE,  in the last 168 hours No results  found for this basename: AMMONIA,  in the last 168 hours  CBC:  Recent Labs Lab 01/05/13 0545 01/06/13 0639  WBC 12.1* 10.6*  HGB 10.5* 9.6*  HCT 31.7* 29.1*  MCV 83.0 83.6  PLT 139* 148*    Cardiac Enzymes:  Recent Labs Lab 01/03/13 0440  CKTOTAL 101    BNP: No components found with this basename: POCBNP,   CBG:  Recent Labs Lab 01/06/13 1614 01/06/13 2001 01/06/13 2352 01/07/13 0408 01/07/13 0742  GLUCAP 141* 128* 141* 146* 137*    Microbiology: Results for orders placed during the hospital encounter of 12/30/12  URINE CULTURE     Status: None   Collection Time    12/30/12 10:12 PM      Result Value Range Status   Specimen Description URINE, CATHETERIZED   Final   Special Requests NONE   Final   Culture  Setup Time 12/30/2012 23:10   Final   Colony Count >=100,000 COLONIES/ML   Final   Culture ESCHERICHIA COLI   Final   Report Status 01/02/2013 FINAL   Final   Organism ID, Bacteria ESCHERICHIA COLI   Final    Coagulation Studies:  Recent Labs  01/05/13 1210  LABPROT 15.0  INR 1.20    Urinalysis:    Component Value Date/Time   COLORURINE ORANGE* 01/03/2013 1650   APPEARANCEUR CLOUDY* 01/03/2013 1650   LABSPEC  1.028 01/03/2013 1650   PHURINE 5.0 01/03/2013 1650   GLUCOSEU 100* 01/03/2013 1650   HGBUR NEGATIVE 01/03/2013 1650   BILIRUBINUR MODERATE* 01/03/2013 1650   KETONESUR 15* 01/03/2013 1650   PROTEINUR NEGATIVE 01/03/2013 1650   UROBILINOGEN 0.2 01/03/2013 1650   NITRITE NEGATIVE 01/03/2013 1650   LEUKOCYTESUR MODERATE* 01/03/2013 1650     Imaging: Ir Fluoro Guide Cv Line Right  01/05/2013  *RADIOLOGY REPORT*  Indication: End-stage renal disease, in need of intravenous access for initiation of hemodialysis  TUNNELED CENTRAL VENOUS HEMODIALYSIS CATHETER PLACEMENT WITH ULTRASOUND AND FLUOROSCOPIC GUIDANCE  Medications: Versed 2 mg IV; Fentanyl 50 mcg IV; Ancef 1 gm IV; The IV antibiotic was given in an appropriate time interval prior to skin  puncture.  Contrast: None  Total Moderate Sedation Time: 20 minutes.  Fluoroscopy Time: 0.7 minutes.  Complications:  None immediate  Findings / Procedure:  Informed written consent was obtained from the patient's daughter after a discussion of the risks, benefits, and alternatives to treatment.  Questions regarding the procedure were encouraged and answered.  The right neck and chest were prepped with chlorhexidine in a sterile fashion, and a sterile drape was applied covering the operative field.  Maximum barrier sterile technique with sterile gowns and gloves were used for the procedure.  A timeout was performed prior to the initiation of the procedure.  After creating a small venotomy incision, a micropuncture kit was utilized to access the right internal jugular vein under direct, real-time ultrasound guidance after the overlying soft tissues were anesthetized with 1% lidocaine with epinephrine.  Ultrasound image documentation was performed.  The microwire was kinked to measure appropriate catheter length.  A stiff glidewire was advanced to the level of the IVC and the micropuncture sheath was exchanged for a peel-away sheath.  A Hemosplit tunneled hemodialysis catheter measuring 19 cm from tip to cuff was tunneled in a retrograde fashion from the anterior chest wall to the venotomy incision.  The catheter was then placed through the peel-away sheath with tips ultimately positioned within the superior aspect of the right atrium.  Final catheter positioning was confirmed and documented with a spot radiographic image.  The catheter aspirates and flushes normally.  The catheter was flushed with appropriate volume heparin dwells.  The catheter exit site was secured with a 0-Prolene retention suture.  The venotomy incision was closed with an interrupted 4-0 Vicryl, Dermabond and Steri-strips.  Dressings were applied.  The patient tolerated the procedure well without immediate post procedural complication.   IMPRESSION:  Successful placement of 19 cm tip to cuff tunneled hemodialysis catheter via the right internal jugular vein with tips terminating within the superior aspect of the right atrium.  The catheter is ready for immediate use.   Original Report Authenticated By: Tacey Ruiz, MD    Ir US Guide Vasc Access Right  01/05/2013  *RADIOLOGY REPORT*  Indication: End-stage renal disease, in need of intravenous access for initiation of hemodialysis  TUNNELED CENTRAL VENOUS HEMODIALYSIS CATHETER PLACEMENT WITH ULTRASOUND AND FLUOROSCOPIC GUIDANCE  Medications: Versed 2 mg IV; Fentanyl 50 mcg IV; Ancef 1 gm IV; The IV antibiotic was given in an appropriate time interval prior to skin puncture.  Contrast: None  Total Moderate Sedation Time: 20 minutes.  Fluoroscopy Time: 0.7 minutes.  Complications:  None immediate  Findings / Procedure:  Informed written consent was obtained from the patient's daughter after a discussion of the risks, benefits, and alternatives to treatment.  Questions regarding the procedure were encouraged and  answered.  The right neck and chest were prepped with chlorhexidine in a sterile fashion, and a sterile drape was applied covering the operative field.  Maximum barrier sterile technique with sterile gowns and gloves were used for the procedure.  A timeout was performed prior to the initiation of the procedure.  After creating a small venotomy incision, a micropuncture kit was utilized to access the right internal jugular vein under direct, real-time ultrasound guidance after the overlying soft tissues were anesthetized with 1% lidocaine with epinephrine.  Ultrasound image documentation was performed.  The microwire was kinked to measure appropriate catheter length.  A stiff glidewire was advanced to the level of the IVC and the micropuncture sheath was exchanged for a peel-away sheath.  A Hemosplit tunneled hemodialysis catheter measuring 19 cm from tip to cuff was tunneled in a retrograde  fashion from the anterior chest wall to the venotomy incision.  The catheter was then placed through the peel-away sheath with tips ultimately positioned within the superior aspect of the right atrium.  Final catheter positioning was confirmed and documented with a spot radiographic image.  The catheter aspirates and flushes normally.  The catheter was flushed with appropriate volume heparin dwells.  The catheter exit site was secured with a 0-Prolene retention suture.  The venotomy incision was closed with an interrupted 4-0 Vicryl, Dermabond and Steri-strips.  Dressings were applied.  The patient tolerated the procedure well without immediate post procedural complication.  IMPRESSION:  Successful placement of 19 cm tip to cuff tunneled hemodialysis catheter via the right internal jugular vein with tips terminating within the superior aspect of the right atrium.  The catheter is ready for immediate use.   Original Report Authenticated By: Tacey Ruiz, MD    Dg Chest Port 1 View  01/06/2013  *RADIOLOGY REPORT*  Clinical Data: Possible pneumonia versus pulmonary edema after hemodialysis  PORTABLE CHEST - 1 VIEW  Comparison: 01/05/2012  Findings: Cardiomegaly with mild interstitial edema, improved. Bilateral lower lobe opacities, likely atelectasis. No pleural effusion or pneumothorax.  Right IJ dual lumen dialysis catheter with its distal tip in the right atrium.  IMPRESSION: Cardiomegaly with mild interstitial edema, improved.  Bilateral lower lobe opacities, likely atelectasis.   Original Report Authenticated By: Charline Bills, M.D.       Medications:    Infusions: . sodium chloride 20 mL/hr at 01/06/13 1700    Scheduled Medications: . allopurinol  200 mg Oral Daily  . aspirin EC  81 mg Oral Daily  . atorvastatin  20 mg Oral q1800  . atropine  1 drop Left Eye BID  . bimatoprost  1 drop Left Eye Daily  . cefTRIAXone (ROCEPHIN)  IV  1 g Intravenous Q24H  . citalopram  20 mg Oral Daily  .  darbepoetin (ARANESP) injection - DIALYSIS  60 mcg Intravenous Q Thu-HD  . Difluprednate  1 drop Left Eye QID  . dorzolamide-timolol  1 drop Both Eyes BID  . feeding supplement  1 Container Oral BID BM  . heparin  5,000 Units Subcutaneous Q8H  . insulin aspart  0-9 Units Subcutaneous Q4H  . insulin glargine  6 Units Subcutaneous QHS  . polyvinyl alcohol  1 drop Left Eye BID    PRN Medications: hydrALAZINE, ondansetron (ZOFRAN) IV   Assessment/ Plan:    Pt is a 79 y.o. yo female with a PMHx of CKD, HTN, DM II, Gout who was admitted on 12/30/2012 for abdominal/flank pain and UTI. Renal service is consulted for worsening Cr.  1) A/C renal failure. Thought due to UTI and hemodynamic changes with low BP. Remains oliguric but UOP improving. PT s/p two HD treatments (3/25, 3/26).  MS improved slightly. will have another HD treatment today. Cr. Is improving: 4.46 today, from 6.69 yesterday. BUN also improving: 58 today, from 67 yesterday. Had some UOP over the last 24 hours (300 mL/kg/hr)-is an improvement  2. HTN/Vol - IVF held. BP is now increasing where it had been low.  I will write for slightly more UF today with HD to help, may need resumption of some of her BP meds as well, will follow.    3. Anemia - iron stores need to be drawn- will order with HD today; aranesp ordered.   4. UTI - continue on Rocephin for E. Coli  5. Dispo - pt family will not have chronic HD support; short term HD is ok. We will follow for hopeful recovery.   Length of Stay: 8 days  Patient history and plan of care reviewed with attending, Dr. Annie Sable.   Winnifred Friar, Student-PA  01/07/2013, 9:04 AM  Patient seen and examined, agree with above note with above modifications.  Mental status is improving with HD.  She is making more urine which hopefully is a good sign.  Regarding BP, UF with HD should improve but we may need to restart some of her home BP meds.  Again, looking only at short term  dialysis for this patient and that has been discussed with the family.  Annie Sable, MD 01/07/2013

## 2013-01-08 LAB — CBC
Hemoglobin: 10.7 g/dL — ABNORMAL LOW (ref 12.0–15.0)
MCH: 27.6 pg (ref 26.0–34.0)
MCHC: 34.2 g/dL (ref 30.0–36.0)
Platelets: 116 10*3/uL — ABNORMAL LOW (ref 150–400)
RBC: 3.88 MIL/uL (ref 3.87–5.11)

## 2013-01-08 LAB — RENAL FUNCTION PANEL
Albumin: 2.7 g/dL — ABNORMAL LOW (ref 3.5–5.2)
CO2: 28 mEq/L (ref 19–32)
Chloride: 102 mEq/L (ref 96–112)
GFR calc Af Amer: 17 mL/min — ABNORMAL LOW (ref >90)
GFR calc non Af Amer: 15 mL/min — ABNORMAL LOW (ref >90)
Sodium: 143 mEq/L (ref 135–145)

## 2013-01-08 LAB — GLUCOSE, CAPILLARY
Glucose-Capillary: 116 mg/dL — ABNORMAL HIGH (ref 70–99)
Glucose-Capillary: 114 mg/dL — ABNORMAL HIGH (ref 70–99)
Glucose-Capillary: 121 mg/dL — ABNORMAL HIGH (ref 70–99)

## 2013-01-08 MED ORDER — AMLODIPINE BESYLATE 5 MG PO TABS
5.0000 mg | ORAL_TABLET | Freq: Every day | ORAL | Status: DC
  Administered 2013-01-09 – 2013-01-13 (×5): 5 mg via ORAL
  Filled 2013-01-08 (×6): qty 1

## 2013-01-08 MED ORDER — FUROSEMIDE 40 MG PO TABS
40.0000 mg | ORAL_TABLET | Freq: Two times a day (BID) | ORAL | Status: DC
  Administered 2013-01-09 – 2013-01-13 (×9): 40 mg via ORAL
  Filled 2013-01-08 (×13): qty 1

## 2013-01-08 NOTE — Progress Notes (Signed)
Utilization review completed.  

## 2013-01-08 NOTE — Progress Notes (Signed)
Physical Therapy Treatment Patient Details Name: [name] MRN: [id number] DOB: 1928-02-04 Today's Date: 01/08/2013 Time: 4098-1191 PT Time Calculation (min): 14 min  PT Assessment / Plan / Recommendation Comments on Treatment Session  Pt presents with UTI and N/V.  Pt also now with renal failure and initiated HD this week.  Pt more awake today but very weak.      Follow Up Recommendations  SNF     Does the patient have the potential to tolerate intense rehabilitation     Barriers to Discharge        Equipment Recommendations  None recommended by PT    Recommendations for Other Services    Frequency Min 2X/week   Plan Discharge plan remains appropriate;Frequency remains appropriate    Precautions / Restrictions Precautions Precautions: Fall Restrictions Weight Bearing Restrictions: No   Pertinent Vitals/Pain Denies pain.    Mobility  Bed Mobility Supine to Sit: 1: +2 Total assist Supine to Sit: Patient Percentage: 20% Sitting - Scoot to Edge of Bed: 1: +2 Total assist Sitting - Scoot to Edge of Bed: Patient Percentage: 10% Sit to Supine: 1: +2 Total assist Sit to Supine: Patient Percentage: 10%    Exercises General Exercises - Lower Extremity Ankle Circles/Pumps: AROM;Both;10 reps;Seated   PT Diagnosis:    PT Problem List:   PT Treatment Interventions:     PT Goals Acute Rehab PT Goals PT Goal Formulation: Patient unable to participate in goal setting Time For Goal Achievement: 01/22/13 Potential to Achieve Goals: Fair Pt will Roll Supine to Left Side: with mod assist PT Goal: Rolling Supine to Left Side - Progress: Revised due to lack of progress Pt will go Supine/Side to Sit: with mod assist PT Goal: Supine/Side to Sit - Progress: Revised due to lack of progress Pt will go Sit to Supine/Side: with mod assist PT Goal: Sit to Supine/Side - Progress: Revised due to lack of progress Pt will go Sit to Stand: with +2 total assist PT Goal: Sit to Stand -  Progress: Revised due to lack of progress Pt will Ambulate: 1 - 15 feet;with +2 total assist PT Goal: Ambulate - Progress: Revised due to lack of progress  Visit Information  Last PT Received On: 01/08/13 Assistance Needed: +2    Subjective Data  Subjective: Pt answering questions appropriately   Cognition  Cognition Arousal/Alertness: Awake/alert Behavior During Session: [facility] for tasks performed Cognition - Other Comments: Awake with verbal stimuli and following 1 step commands.    Balance  Static Sitting Balance Static Sitting - Balance Support: Bilateral upper extremity supported Static Sitting - Level of Assistance: 4: Min assist;3: Mod assist Static Sitting - Comment/# of Minutes: Sat EOB x 7-8 minutes.  Initially required mod A due to posterior lean but once pt place hands on my knees when I was seated in front of her she was able to maintain with min A.  End of Session PT - End of Session Equipment Utilized During Treatment: Oxygen Activity Tolerance: Patient limited by fatigue Patient left: in bed;with call bell/phone within reach;with nursing in room Nurse Communication: Mobility status   GP     [facility] 01/08/2013, 1:49 PM  [facility] PT [phone]

## 2013-01-08 NOTE — Progress Notes (Signed)
Subjective: Much more awake: verbalizes. Has no specific complaint  Objective: Lab: Lab Results  Component Value Date   WBC 8.6 01/07/2013   HGB 9.5* 01/07/2013   HCT 28.4* 01/07/2013   MCV 80.9 01/07/2013   PLT 144* 01/07/2013   BMET    Component Value Date/Time   NA 143 01/08/2013 0645   K 3.7 01/08/2013 0645   CL 102 01/08/2013 0645   CO2 28 01/08/2013 0645   GLUCOSE 118* 01/08/2013 0645   BUN 38* 01/08/2013 0645   CREATININE 2.75* 01/08/2013 0645   CALCIUM 9.5 01/08/2013 0645   GFRNONAA 15* 01/08/2013 0645   GFRAA 17* 01/08/2013 0645     Imaging:  Scheduled Meds: . allopurinol  200 mg Oral Daily  . aspirin EC  81 mg Oral Daily  . atorvastatin  20 mg Oral q1800  . atropine  1 drop Left Eye BID  . bimatoprost  1 drop Left Eye Daily  . cefTRIAXone (ROCEPHIN)  IV  1 g Intravenous Q24H  . citalopram  20 mg Oral Daily  . darbepoetin (ARANESP) injection - DIALYSIS  60 mcg Intravenous Q Thu-HD  . Difluprednate  1 drop Left Eye QID  . dorzolamide-timolol  1 drop Both Eyes BID  . feeding supplement  1 Container Oral BID BM  . heparin  5,000 Units Subcutaneous Q8H  . insulin aspart  0-9 Units Subcutaneous Q4H  . insulin glargine  6 Units Subcutaneous QHS  . polyvinyl alcohol  1 drop Left Eye BID   Continuous Infusions: . sodium chloride 20 mL/hr at 01/06/13 1700   PRN Meds:.acetaminophen, hydrALAZINE, ondansetron (ZOFRAN) IV   Physical Exam: Filed Vitals:   01/08/13 0951  BP: 186/60  Pulse: 71  Temp: 98.2 F (36.8 C)  Resp: 18    Intake/Output Summary (Last 24 hours) at 01/08/13 1001 Last data filed at 01/08/13 0700  Gross per 24 hour  Intake    220 ml  Output   1625 ml  Net  -1405 ml  No urine output for 3/28 recorded. Foley bag with minimal urine  HEENT- OS with clear conjunctiva, cataract; edentulous Cor- 2+ radial, quiet precordium, RRR, feet warm Pum - no increased WOB, no wheeze Abd- obese, soft, not tender to palpation Ext - no deformity Drm - chronic leg  changes, no skin breakdown heels or legs.       Assessment/Plan: 1. ID - Day #3 resumed ceftriaxone. No fever. Plan F/u CBC in AM  2. GI - normal  3. DM -  CBG (last 3)   Recent Labs  01/08/13 0007 01/08/13 0419 01/08/13 0733  GLUCAP 116* 129* 114*    4. HTN - trending up. With renal issues will continue prn hydralazine  5. Renal - creatinine coming down but no significant UOP today. Plan Per nephrology  6. Cardiac - stable  Very slow progress. Spoke with patient's son.   Illene Regulus [facility] (o) [phone]; (c) [phone] Call-grp - [facility]  Tele: [phone]  01/08/2013, 10:00 AM

## 2013-01-08 NOTE — Progress Notes (Signed)
The RN call the On call MD(Dr. Allie Dimmer) from the [facility] group an asked about the NPO status and the PO medications that were order today,after revising the case,the MD. York Spaniel to keep pt. NPO until Dr. Debby Bud see the pt. Tomorrow.Keep monitoring pt. Closely and assessing her needs.

## 2013-01-08 NOTE — Progress Notes (Signed)
NUTRITION FOLLOW UP  DOCUMENTATION CODES  Per approved criteria  -Obesity Unspecified   Intervention:   - If suspected continued slow progress and waxing and waning of mental status, strongly recommend initiation of enteral nutrition given pt with prolonged suboptimal oral intake since admission  - Consider SLP evaluation to determine most appropriate diet texture and liquid consistency   Nutrition Dx:   Inadequate oral intake now related to altered mental status as evidenced by NPO status. - ongoing   Goal:   Pt to meet >90% of estimated nutrition needs - unmet   Monitor:   Weight trends, lab trends, I/O's  Assessment:   Pt admitted with abdomen pain x 1 week, n/v/d x 1 day. Pt admitted with UTI. Pt with acute on chronic renal failure with PMH of CKD, HTN, gout, and T2DM. Pt currently with symptoms of uremia. Pt's ongoing lack of oral intake likely due to uremia.   Pt with HD tx on 3/25, 3/26, 3/27. Per nephrology, Cr, BUN, and urine output improving. At this point, HD is only necessary short term.   Pt admitted 3/19 and was on carbohydrate modified diet. Pt with poor po intake. Supplements ordered but likely not consumed by pt due to decreased appetite from uremia. Pt is now receiving HD treatments and has been NPO for past 2 days.     Height: Ht Readings from Last 1 Encounters:  12/31/12 5\' 5"  (1.651 m)    Weight Status:   Wt Readings from Last 1 Encounters:  01/07/13 201 lb 4.5 oz (91.3 kg)  01/07/13 204 lbs 01/06/13 208 lbs 01/06/13 210 lbs   01/05/13 210 lbs 01/05/13 206 lbs  01/04/13 207 lbs  01/01/13 199 lbs  12/31/12 193 lbs   - Weight fluctuations likely related to fluid. Pt is +2,980 ml since admission but pt is also still undergoing HD treatment.   Re-estimated needs:  Kcal: 1500-1650  Protein: 90 - 105 gm Fluid: 1.5 L   Skin: intact   Diet Order: NPO   Intake/Output Summary (Last 24 hours) at 01/08/13 0956 Last data filed at 01/08/13 0700  Gross  per 24 hour  Intake    220 ml  Output   1625 ml  Net  -1405 ml    Last BM: 01/07/2013    Labs:   Recent Labs Lab 01/06/13 0545 01/07/13 0630 01/08/13 0645  NA 139 140 143  K 4.5 4.1 3.7  CL 102 102 102  CO2 23 22 28   BUN 67* 58* 38*  CREATININE 6.69* 4.46* 2.75*  CALCIUM 9.1 9.2 9.5  PHOS 7.1* 5.6* 3.6  GLUCOSE 145* 143* 118*    CBG (last 3)   Recent Labs  01/08/13 0007 01/08/13 0419 01/08/13 0733  GLUCAP 116* 129* 114*    Scheduled Meds: . allopurinol  200 mg Oral Daily  . aspirin EC  81 mg Oral Daily  . atorvastatin  20 mg Oral q1800  . atropine  1 drop Left Eye BID  . bimatoprost  1 drop Left Eye Daily  . cefTRIAXone (ROCEPHIN)  IV  1 g Intravenous Q24H  . citalopram  20 mg Oral Daily  . darbepoetin (ARANESP) injection - DIALYSIS  60 mcg Intravenous Q Thu-HD  . Difluprednate  1 drop Left Eye QID  . dorzolamide-timolol  1 drop Both Eyes BID  . feeding supplement  1 Container Oral BID BM  . heparin  5,000 Units Subcutaneous Q8H  . insulin aspart  0-9 Units Subcutaneous Q4H  . insulin  glargine  6 Units Subcutaneous QHS  . polyvinyl alcohol  1 drop Left Eye BID    Continuous Infusions: . sodium chloride 20 mL/hr at 01/06/13 1700    Belenda Cruise  Dietetic Intern Pager: [phone]  I agree with the above information. Jarold Motto MS, RD, LDN Pager: [phone] After-hours pager: [phone]

## 2013-01-08 NOTE — Progress Notes (Signed)
[facility] - PROGRESS NOTE Resident Note   Please see below for attending addendum to resident note.  Subjective:   Pt somnolent but responsive today to verbal stimulus. Unaware of environment but able to say her name, age, and address for me. Had HD treatments on 3/25, 3/26, 3/27. Still making some urine (325). BP 176/74.  Objective:    Vital Signs:   Temp:  [98.6 F (37 C)-100.2 F (37.9 C)] 98.8 F (37.1 C) (03/28 0421) Pulse Rate:  [64-82] 73 (03/28 0421) Resp:  [16-18] 18 (03/28 0421) BP: (156-186)/(56-84) 176/74 mmHg (03/28 0421) SpO2:  [94 %-100 %] 95 % (03/28 0421) Weight:  [91.3 kg (201 lb 4.5 oz)-92.7 kg (204 lb 5.9 oz)] 91.3 kg (201 lb 4.5 oz) (03/27 2026) Last BM Date: 01/07/13  24-hour weight change: Weight change: -1.9 kg (-4 lb 3 oz)  Weight trends: Filed Weights   01/07/13 1300 01/07/13 1714 01/07/13 2026  Weight: 92.7 kg (204 lb 5.9 oz) 91.5 kg (201 lb 11.5 oz) 91.3 kg (201 lb 4.5 oz)    Intake/Output:  03/27 0701 - 03/28 0700 In: 220 [I.V.:120; IV Piggyback:100] Out: 1625 [Urine:325]  Physical Exam: General: Vital signs reviewed and noted. Well-developed, well-nourished, in no acute distress; alert to name  Lungs:  Normal respiratory effort. Clear to auscultation BL without crackles or wheezes.  Heart: RRR. Distant heart sounds. II/VI systolic murmur best heard at RSB.   Abdomen:  BS normoactive. Soft, Nondistended, non-tender.  No masses or organomegaly.  Extremities: Pretibial hyperpigmentation BL. No pretibial edema.     Labs: Basic Metabolic Panel:  Recent Labs Lab 01/06/13 0545 01/07/13 0630 01/08/13 0645  NA 139 140 143  K 4.5 4.1 3.7  CL 102 102 102  CO2 23 22 28   GLUCOSE 145* 143* 118*  BUN 67* 58* 38*  CREATININE 6.69* 4.46* 2.75*  CALCIUM 9.1 9.2 9.5  PHOS 7.1* 5.6* 3.6    Liver Function Tests:  Recent Labs Lab 01/06/13 0545 01/07/13 0630 01/08/13 0645  ALBUMIN 3.0* 2.8* 2.7*   No results found for  this basename: LIPASE, AMYLASE,  in the last 168 hours No results found for this basename: AMMONIA,  in the last 168 hours  CBC:  Recent Labs Lab 01/05/13 0545 01/06/13 0639 01/07/13 1300  WBC 12.1* 10.6* 8.6  NEUTROABS  --   --  7.1  HGB 10.5* 9.6* 9.5*  HCT 31.7* 29.1* 28.4*  MCV 83.0 83.6 80.9  PLT 139* 148* 144*    Cardiac Enzymes:  Recent Labs Lab 01/03/13 0440  CKTOTAL 101    BNP: No components found with this basename: POCBNP,   CBG:  Recent Labs Lab 01/07/13 1737 01/07/13 2023 01/08/13 0007 01/08/13 0419 01/08/13 0733  GLUCAP 115* 118* 116* 129* 114*    Microbiology: Results for orders placed during the hospital encounter of 12/30/12  URINE CULTURE     Status: None   Collection Time    12/30/12 10:12 PM      Result Value Range Status   Specimen Description URINE, CATHETERIZED   Final   Special Requests NONE   Final   Culture  Setup Time 12/30/2012 23:10   Final   Colony Count >=100,000 COLONIES/ML   Final   Culture ESCHERICHIA COLI   Final   Report Status 01/02/2013 FINAL   Final   Organism ID, Bacteria ESCHERICHIA COLI   Final  CULTURE, BLOOD (ROUTINE X 2)     Status: None   Collection Time  01/07/13  1:25 PM      Result Value Range Status   Specimen Description BLOOD HEMODIALYSIS CATHETER   Final   Special Requests BOTTLES DRAWN AEROBIC AND ANAEROBIC 10CC   Final   Culture  Setup Time 01/07/2013 18:04   Final   Culture     Final   Value:        BLOOD CULTURE RECEIVED NO GROWTH TO DATE CULTURE WILL BE HELD FOR 5 DAYS BEFORE ISSUING A FINAL NEGATIVE REPORT   Report Status PENDING   Incomplete  CULTURE, BLOOD (ROUTINE X 2)     Status: None   Collection Time    01/07/13  1:30 PM      Result Value Range Status   Specimen Description BLOOD HEMODIALYSIS CATHETER   Final   Special Requests BOTTLES DRAWN AEROBIC AND ANAEROBIC 10CC   Final   Culture  Setup Time 01/07/2013 18:04   Final   Culture     Final   Value:        BLOOD CULTURE RECEIVED  NO GROWTH TO DATE CULTURE WILL BE HELD FOR 5 DAYS BEFORE ISSUING A FINAL NEGATIVE REPORT   Report Status PENDING   Incomplete    Coagulation Studies:  Recent Labs  01/05/13 1210  LABPROT 15.0  INR 1.20    Urinalysis:    Component Value Date/Time   COLORURINE ORANGE* 01/03/2013 1650   APPEARANCEUR CLOUDY* 01/03/2013 1650   LABSPEC 1.028 01/03/2013 1650   PHURINE 5.0 01/03/2013 1650   GLUCOSEU 100* 01/03/2013 1650   HGBUR NEGATIVE 01/03/2013 1650   BILIRUBINUR MODERATE* 01/03/2013 1650   KETONESUR 15* 01/03/2013 1650   PROTEINUR NEGATIVE 01/03/2013 1650   UROBILINOGEN 0.2 01/03/2013 1650   NITRITE NEGATIVE 01/03/2013 1650   LEUKOCYTESUR MODERATE* 01/03/2013 1650     Imaging: Dg Chest Port 1 View  01/06/2013  *RADIOLOGY REPORT*  Clinical Data: Possible pneumonia versus pulmonary edema after hemodialysis  PORTABLE CHEST - 1 VIEW  Comparison: 01/05/2012  Findings: Cardiomegaly with mild interstitial edema, improved. Bilateral lower lobe opacities, likely atelectasis. No pleural effusion or pneumothorax.  Right IJ dual lumen dialysis catheter with its distal tip in the right atrium.  IMPRESSION: Cardiomegaly with mild interstitial edema, improved.  Bilateral lower lobe opacities, likely atelectasis.   Original Report Authenticated By: Charline Bills, M.D.       Medications:    Infusions: . sodium chloride 20 mL/hr at 01/06/13 1700    Scheduled Medications: . allopurinol  200 mg Oral Daily  . aspirin EC  81 mg Oral Daily  . atorvastatin  20 mg Oral q1800  . atropine  1 drop Left Eye BID  . bimatoprost  1 drop Left Eye Daily  . cefTRIAXone (ROCEPHIN)  IV  1 g Intravenous Q24H  . citalopram  20 mg Oral Daily  . darbepoetin (ARANESP) injection - DIALYSIS  60 mcg Intravenous Q Thu-HD  . Difluprednate  1 drop Left Eye QID  . dorzolamide-timolol  1 drop Both Eyes BID  . feeding supplement  1 Container Oral BID BM  . heparin  5,000 Units Subcutaneous Q8H  . insulin aspart  0-9 Units  Subcutaneous Q4H  . insulin glargine  6 Units Subcutaneous QHS  . polyvinyl alcohol  1 drop Left Eye BID    PRN Medications: acetaminophen, hydrALAZINE, ondansetron (ZOFRAN) IV   Assessment/ Plan:    Pt is a 79 y.o. yo female with a PMHx of CKD, HTN, DM II, Gout who was admitted on 12/30/2012 for abdominal/flank  pain and UTI. Renal service is consulted for Worsening Cr and AMS.  1) A/C renal failure. Thought due to UTI and hemodynamic changes with low BP. Remains oliguric but UOP improving. PT s/p 3 HD treatments (3/25, 3/26, 3/27). MS continues to improve slowly but surely. Will watch daily and reeval for HD need. Had some UOP over the last 24 hours (325 mL/kg/hr)-- improvement!  2) HTN/Vol - IVF held. BP continues to stay a bit elevated likely due to the fact that we were keeping her slightly overloaded to help with eventual recovery. Have chosen to resume her norvasc and will add lasix as well to encourage more UOP and as a treatment for HTN  3) Anemia - Iron stores: T sat 46%, Fe 95, Ferritin 79; will continue Aranesp once a week with dialysis.   4) UTI - Continues on rocephin.   5) Dispo - Short term HD ok, but not for chronic OP dialysis. We will follow for hopeful recovery.  Length of Stay: 9 days  Patient history and plan of care reviewed with attending, Dr. Annie Sable.   Winnifred Friar, Student-PA  01/08/2013, 8:50 AM  Patient seen and examined, agree with above note with above modifications. Mental status improved.  Making some urine but not a lot.  No indications for HD today, will plan to watch daily labs and UOP to determine if further support is needed.  Regarding BP, will add back norvasc and also lasix and follow Annie Sable, MD 01/08/2013

## 2013-01-09 LAB — GLUCOSE, CAPILLARY
Glucose-Capillary: 122 mg/dL — ABNORMAL HIGH (ref 70–99)
Glucose-Capillary: 130 mg/dL — ABNORMAL HIGH (ref 70–99)
Glucose-Capillary: 119 mg/dL — ABNORMAL HIGH (ref 70–99)
Glucose-Capillary: 117 mg/dL — ABNORMAL HIGH (ref 70–99)
Glucose-Capillary: 173 mg/dL — ABNORMAL HIGH (ref 70–99)

## 2013-01-09 LAB — RENAL FUNCTION PANEL
Albumin: 2.8 g/dL — ABNORMAL LOW (ref 3.5–5.2)
BUN: 50 mg/dL — ABNORMAL HIGH (ref 6–23)
Calcium: 9.7 mg/dL (ref 8.4–10.5)
Glucose, Bld: 112 mg/dL — ABNORMAL HIGH (ref 70–99)
Phosphorus: 3.6 mg/dL (ref 2.3–4.6)

## 2013-01-09 MED ORDER — BISACODYL 10 MG RE SUPP
10.0000 mg | Freq: Once | RECTAL | Status: AC
  Administered 2013-01-09: 10 mg via RECTAL
  Filled 2013-01-09: qty 1

## 2013-01-09 MED ORDER — SENNOSIDES 8.8 MG/5ML PO SYRP
10.0000 mL | ORAL_SOLUTION | Freq: Every day | ORAL | Status: DC
  Administered 2013-01-09 – 2013-01-12 (×4): 10 mL via ORAL
  Filled 2013-01-09 (×5): qty 10

## 2013-01-09 NOTE — Progress Notes (Signed)
[facility] - PROGRESS NOTE Resident Note   Please see below for attending addendum to resident note.  Subjective:   Much more alert, eating breakfast, son at bedside.  UOP only recorded as 400 but seem to have missed some, foley with 600 at least in bag.  BP cont to be high  Objective:    Vital Signs:   Temp:  [98.1 F (36.7 C)-98.2 F (36.8 C)] 98.1 F (36.7 C) (03/29 1052) Pulse Rate:  [62-73] 68 (03/29 1052) Resp:  [18] 18 (03/29 1052) BP: (171-190)/(68-76) 184/70 mmHg (03/29 1052) SpO2:  [94 %-100 %] 100 % (03/29 1052) Last BM Date: 01/07/13  24-hour weight change: Weight change:   Weight trends: Filed Weights   01/07/13 1300 01/07/13 1714 01/07/13 2026  Weight: 92.7 kg (204 lb 5.9 oz) 91.5 kg (201 lb 11.5 oz) 91.3 kg (201 lb 4.5 oz)    Intake/Output:  03/28 0701 - 03/29 0700 In: 330 [I.V.:330] Out: 402 [Urine:400; Stool:2]  Physical Exam: General: Vital signs reviewed and noted. Well-developed, well-nourished, in no acute distress; alert to name  Lungs:  Normal respiratory effort. Clear to auscultation BL without crackles or wheezes.  Heart: RRR. Distant heart sounds. II/VI systolic murmur best heard at RSB.   Abdomen:  BS normoactive. Soft, Nondistended, non-tender.  No masses or organomegaly.  Extremities: Pretibial hyperpigmentation BL. No pretibial edema.     Labs: Basic Metabolic Panel:  Recent Labs Lab 01/07/13 0630 01/08/13 0645 01/09/13 0500  NA 140 143 142  K 4.1 3.7 3.4*  CL 102 102 104  CO2 22 28 26   GLUCOSE 143* 118* 112*  BUN 58* 38* 50*  CREATININE 4.46* 2.75* 2.85*  CALCIUM 9.2 9.5 9.7  PHOS 5.6* 3.6 3.6    Liver Function Tests:  Recent Labs Lab 01/07/13 0630 01/08/13 0645 01/09/13 0500  ALBUMIN 2.8* 2.7* 2.8*   No results found for this basename: LIPASE, AMYLASE,  in the last 168 hours No results found for this basename: AMMONIA,  in the last 168 hours  CBC:  Recent Labs Lab 01/06/13 0639  01/07/13 1300 01/08/13 1030  WBC 10.6* 8.6 8.7  NEUTROABS  --  7.1  --   HGB 9.6* 9.5* 10.7*  HCT 29.1* 28.4* 31.3*  MCV 83.6 80.9 80.7  PLT 148* 144* 116*    Cardiac Enzymes:  Recent Labs Lab 01/03/13 0440  CKTOTAL 101    BNP: No components found with this basename: POCBNP,   CBG:  Recent Labs Lab 01/08/13 1653 01/08/13 2015 01/09/13 0003 01/09/13 0405 01/09/13 0833  GLUCAP 120* 130* 122* 119* 117*    Microbiology: Results for orders placed during the hospital encounter of 12/30/12  URINE CULTURE     Status: None   Collection Time    12/30/12 10:12 PM      Result Value Range Status   Specimen Description URINE, CATHETERIZED   Final   Special Requests NONE   Final   Culture  Setup Time 12/30/2012 23:10   Final   Colony Count >=100,000 COLONIES/ML   Final   Culture ESCHERICHIA COLI   Final   Report Status 01/02/2013 FINAL   Final   Organism ID, Bacteria ESCHERICHIA COLI   Final  CULTURE, BLOOD (ROUTINE X 2)     Status: None   Collection Time    01/07/13  1:25 PM      Result Value Range Status   Specimen Description BLOOD HEMODIALYSIS CATHETER   Final   Special Requests BOTTLES DRAWN AEROBIC  AND ANAEROBIC 10CC   Final   Culture  Setup Time 01/07/2013 18:04   Final   Culture     Final   Value:        BLOOD CULTURE RECEIVED NO GROWTH TO DATE CULTURE WILL BE HELD FOR 5 DAYS BEFORE ISSUING A FINAL NEGATIVE REPORT   Report Status PENDING   Incomplete  CULTURE, BLOOD (ROUTINE X 2)     Status: None   Collection Time    01/07/13  1:30 PM      Result Value Range Status   Specimen Description BLOOD HEMODIALYSIS CATHETER   Final   Special Requests BOTTLES DRAWN AEROBIC AND ANAEROBIC 10CC   Final   Culture  Setup Time 01/07/2013 18:04   Final   Culture     Final   Value:        BLOOD CULTURE RECEIVED NO GROWTH TO DATE CULTURE WILL BE HELD FOR 5 DAYS BEFORE ISSUING A FINAL NEGATIVE REPORT   Report Status PENDING   Incomplete    Coagulation Studies: No results  found for this basename: LABPROT, INR,  in the last 72 hours  Urinalysis:    Component Value Date/Time   COLORURINE ORANGE* 01/03/2013 1650   APPEARANCEUR CLOUDY* 01/03/2013 1650   LABSPEC 1.028 01/03/2013 1650   PHURINE 5.0 01/03/2013 1650   GLUCOSEU 100* 01/03/2013 1650   HGBUR NEGATIVE 01/03/2013 1650   BILIRUBINUR MODERATE* 01/03/2013 1650   KETONESUR 15* 01/03/2013 1650   PROTEINUR NEGATIVE 01/03/2013 1650   UROBILINOGEN 0.2 01/03/2013 1650   NITRITE NEGATIVE 01/03/2013 1650   LEUKOCYTESUR MODERATE* 01/03/2013 1650     Imaging: No results found.    Medications:    Infusions: . sodium chloride 20 mL/hr at 01/08/13 1600    Scheduled Medications: . allopurinol  200 mg Oral Daily  . amLODipine  5 mg Oral Daily  . aspirin EC  81 mg Oral Daily  . atorvastatin  20 mg Oral q1800  . atropine  1 drop Left Eye BID  . bimatoprost  1 drop Left Eye Daily  . citalopram  20 mg Oral Daily  . darbepoetin (ARANESP) injection - DIALYSIS  60 mcg Intravenous Q Thu-HD  . Difluprednate  1 drop Left Eye QID  . dorzolamide-timolol  1 drop Both Eyes BID  . feeding supplement  1 Container Oral BID BM  . furosemide  40 mg Oral BID  . heparin  5,000 Units Subcutaneous Q8H  . insulin aspart  0-9 Units Subcutaneous Q4H  . insulin glargine  6 Units Subcutaneous QHS  . polyvinyl alcohol  1 drop Left Eye BID  . sennosides  10 mL Oral QHS    PRN Medications: acetaminophen, hydrALAZINE, ondansetron (ZOFRAN) IV   Assessment/ Plan:    Pt is a 79 y.o. yo female with a PMHx of CKD, HTN, DM II, Gout who was admitted on 12/30/2012 for abdominal/flank pain and UTI as well as AKI  1) A/C renal failure. Thought due to UTI and hemodynamic changes with low BP.  UOP improving. PT s/p 3 HD treatments (3/25, 3/26, 3/27). MS  Improved. Will watch daily UOP and creatinine to eval for HD need.No indications today.   2) HTN/Vol - IVF stopped.  BP continues to stay elevated likely due to the fact that we were keeping  her slightly overloaded to help with eventual recovery. Have added back norvasc and lasix anticipate continued improvement  3) Anemia - Iron stores: T sat 46%, Fe 95, Ferritin 79; will continue Aranesp  once a week with dialysis.   4) UTI - completed rocephin  5) Dispo - Short term HD ok, but not for chronic OP dialysis per discussion with the family. We will follow for hopeful recovery.  Length of Stay: 10 days   Cecille Aver, MD  01/09/2013, 11:07 AM

## 2013-01-09 NOTE — Progress Notes (Signed)
Subjective: Question of diet noted. Resumption of BP meds by nephrology noted.   Mrs. Garlitz is much more awake. Denies pain. Has not had BM. Is asking for a drink - slowly drank a full cup of water - no choking or cough.  Objective: Lab: Lab Results  Component Value Date   WBC 8.7 01/08/2013   HGB 10.7* 01/08/2013   HCT 31.3* 01/08/2013   MCV 80.7 01/08/2013   PLT 116* 01/08/2013   BMET    Component Value Date/Time   NA 142 01/09/2013 0500   K 3.4* 01/09/2013 0500   CL 104 01/09/2013 0500   CO2 26 01/09/2013 0500   GLUCOSE 112* 01/09/2013 0500   BUN 50* 01/09/2013 0500   CREATININE 2.85* 01/09/2013 0500   CALCIUM 9.7 01/09/2013 0500   GFRNONAA 14* 01/09/2013 0500   GFRAA 17* 01/09/2013 0500     Imaging:  Scheduled Meds: . allopurinol  200 mg Oral Daily  . amLODipine  5 mg Oral Daily  . aspirin EC  81 mg Oral Daily  . atorvastatin  20 mg Oral q1800  . atropine  1 drop Left Eye BID  . bimatoprost  1 drop Left Eye Daily  . citalopram  20 mg Oral Daily  . darbepoetin (ARANESP) injection - DIALYSIS  60 mcg Intravenous Q Thu-HD  . Difluprednate  1 drop Left Eye QID  . dorzolamide-timolol  1 drop Both Eyes BID  . feeding supplement  1 Container Oral BID BM  . furosemide  40 mg Oral BID  . heparin  5,000 Units Subcutaneous Q8H  . insulin aspart  0-9 Units Subcutaneous Q4H  . insulin glargine  6 Units Subcutaneous QHS  . polyvinyl alcohol  1 drop Left Eye BID   Continuous Infusions: . sodium chloride 20 mL/hr at 01/08/13 1600   PRN Meds:.acetaminophen, hydrALAZINE, ondansetron (ZOFRAN) IV   Physical Exam: Filed Vitals:   01/09/13 0406  BP: 182/72  Pulse: 68  Temp: 98.1 F (36.7 C)  Resp: 18    Intake/Output Summary (Last 24 hours) at 01/09/13 0859 Last data filed at 01/09/13 0700  Gross per 24 hour  Intake    330 ml  Output    402 ml  Net    -72 ml  400 cc urine output 01/08/13 Gen'l over weight AA woman in no distress HEENT_ edentulous, OS  - C&S clear, lens with  cataract Cor- 2+ radial, RRR Pulm - normal respirations, no rales Abd - obese, soft, BS+, no gurding or rebound Ext - trace edema Neuro - awake, speech is clear, oriented to person and place. Normal swallow.     Assessment/Plan: 1. ID - Day #4 ceftriaxone  WBC normal, no fever.  Plan Continue ceftriaxone for full 7 days  2. GI - good BS, has an appetite. No BM Plan Advance diet: full liquids and advance as tolerated  Dulcolax supp  Senna qHS  3. DM -  CBG (last 3)   Recent Labs  01/09/13 0003 01/09/13 0405 01/09/13 0833  GLUCAP 122* 119* 117*    Continue sliding scale  4. HTN elevated systolic pressure. She is back on amlodipine and lasix.  Plan Continue meds  Continue hydralazine prn  5. Renal - creatinine essentially unchanged but improved. Some urine output. K just slightly low Plan Per nephrology but does not appear in need of HD  No K+ replacement  6. Cardiac  Stable  7. F/E/N and general care - Plan  full liquid diet and advance as tolerated  Bowel care  OOB to chair  Dispo - SNF when medically stable.      Illene Regulus [facility] IM (o) [phone]; (c) [phone] Call-grp - Pats[facility]  Tele: [phone]  01/09/2013, 8:55 AM

## 2013-01-10 LAB — RENAL FUNCTION PANEL
Albumin: 2.5 g/dL — ABNORMAL LOW (ref 3.5–5.2)
Calcium: 9.2 mg/dL (ref 8.4–10.5)
Creatinine, Ser: 2.51 mg/dL — ABNORMAL HIGH (ref 0.50–1.10)
GFR calc non Af Amer: 17 mL/min — ABNORMAL LOW (ref >90)
Phosphorus: 2.8 mg/dL (ref 2.3–4.6)
Sodium: 137 mEq/L (ref 135–145)

## 2013-01-10 LAB — GLUCOSE, CAPILLARY
Glucose-Capillary: 109 mg/dL — ABNORMAL HIGH (ref 70–99)
Glucose-Capillary: 237 mg/dL — ABNORMAL HIGH (ref 70–99)
Glucose-Capillary: 181 mg/dL — ABNORMAL HIGH (ref 70–99)

## 2013-01-10 NOTE — Progress Notes (Signed)
[facility] - PROGRESS NOTE Resident Note   Please see below for attending addendum to resident note.  Subjective:   Remains alert, sitting at bedside chair.  UOP great and creatinine down slightly !  Objective:    Vital Signs:   Temp:  [98 F (36.7 C)-98.4 F (36.9 C)] 98.2 F (36.8 C) (03/30 0827) Pulse Rate:  [64-72] 68 (03/30 0827) Resp:  [18] 18 (03/30 0827) BP: (146-184)/(56-74) 146/56 mmHg (03/30 0827) SpO2:  [94 %-100 %] 94 % (03/30 0827) Weight:  [89.2 kg (196 lb 10.4 oz)] 89.2 kg (196 lb 10.4 oz) (03/29 2139) Last BM Date: 01/08/13  24-hour weight change: Weight change:   Weight trends: Filed Weights   01/07/13 1714 01/07/13 2026 01/09/13 2139  Weight: 91.5 kg (201 lb 11.5 oz) 91.3 kg (201 lb 4.5 oz) 89.2 kg (196 lb 10.4 oz)    Intake/Output:  03/29 0701 - 03/30 0700 In: 840 [P.O.:420; I.V.:420] Out: 1675 [Urine:1675]  Physical Exam: General: Vital signs reviewed and noted. Well-developed, well-nourished, in no acute distress; alert to name  Lungs:  Normal respiratory effort. Clear to auscultation BL without crackles or wheezes.  Heart: RRR. Distant heart sounds. II/VI systolic murmur best heard at RSB.   Abdomen:  BS normoactive. Soft, Nondistended, non-tender.  No masses or organomegaly.  Extremities: Pretibial hyperpigmentation BL. No pretibial edema.     Labs: Basic Metabolic Panel:  Recent Labs Lab 01/08/13 0645 01/09/13 0500 01/10/13 0655  NA 143 142 137  K 3.7 3.4* 3.3*  CL 102 104 99  CO2 28 26 27   GLUCOSE 118* 112* 102*  BUN 38* 50* 52*  CREATININE 2.75* 2.85* 2.51*  CALCIUM 9.5 9.7 9.2  PHOS 3.6 3.6 2.8    Liver Function Tests:  Recent Labs Lab 01/08/13 0645 01/09/13 0500 01/10/13 0655  ALBUMIN 2.7* 2.8* 2.5*   No results found for this basename: LIPASE, AMYLASE,  in the last 168 hours No results found for this basename: AMMONIA,  in the last 168 hours  CBC:  Recent Labs Lab 01/06/13 0639  01/07/13 1300 01/08/13 1030  WBC 10.6* 8.6 8.7  NEUTROABS  --  7.1  --   HGB 9.6* 9.5* 10.7*  HCT 29.1* 28.4* 31.3*  MCV 83.6 80.9 80.7  PLT 148* 144* 116*    Cardiac Enzymes: No results found for this basename: CKTOTAL, CKMB, TROPONINI,  in the last 168 hours  BNP: No components found with this basename: POCBNP,   CBG:  Recent Labs Lab 01/09/13 1644 01/09/13 1946 01/09/13 2335 01/10/13 0339 01/10/13 0742  GLUCAP 118* 167* 150* 107* 109*    Microbiology: Results for orders placed during the hospital encounter of 12/30/12  URINE CULTURE     Status: None   Collection Time    12/30/12 10:12 PM      Result Value Range Status   Specimen Description URINE, CATHETERIZED   Final   Special Requests NONE   Final   Culture  Setup Time 12/30/2012 23:10   Final   Colony Count >=100,000 COLONIES/ML   Final   Culture ESCHERICHIA COLI   Final   Report Status 01/02/2013 FINAL   Final   Organism ID, Bacteria ESCHERICHIA COLI   Final  CULTURE, BLOOD (ROUTINE X 2)     Status: None   Collection Time    01/07/13  1:25 PM      Result Value Range Status   Specimen Description BLOOD HEMODIALYSIS CATHETER   Final   Special Requests BOTTLES DRAWN  AEROBIC AND ANAEROBIC 10CC   Final   Culture  Setup Time 01/07/2013 18:04   Final   Culture     Final   Value:        BLOOD CULTURE RECEIVED NO GROWTH TO DATE CULTURE WILL BE HELD FOR 5 DAYS BEFORE ISSUING A FINAL NEGATIVE REPORT   Report Status PENDING   Incomplete  CULTURE, BLOOD (ROUTINE X 2)     Status: None   Collection Time    01/07/13  1:30 PM      Result Value Range Status   Specimen Description BLOOD HEMODIALYSIS CATHETER   Final   Special Requests BOTTLES DRAWN AEROBIC AND ANAEROBIC 10CC   Final   Culture  Setup Time 01/07/2013 18:04   Final   Culture     Final   Value:        BLOOD CULTURE RECEIVED NO GROWTH TO DATE CULTURE WILL BE HELD FOR 5 DAYS BEFORE ISSUING A FINAL NEGATIVE REPORT   Report Status PENDING   Incomplete     Coagulation Studies: No results found for this basename: LABPROT, INR,  in the last 72 hours  Urinalysis:    Component Value Date/Time   COLORURINE ORANGE* 01/03/2013 1650   APPEARANCEUR CLOUDY* 01/03/2013 1650   LABSPEC 1.028 01/03/2013 1650   PHURINE 5.0 01/03/2013 1650   GLUCOSEU 100* 01/03/2013 1650   HGBUR NEGATIVE 01/03/2013 1650   BILIRUBINUR MODERATE* 01/03/2013 1650   KETONESUR 15* 01/03/2013 1650   PROTEINUR NEGATIVE 01/03/2013 1650   UROBILINOGEN 0.2 01/03/2013 1650   NITRITE NEGATIVE 01/03/2013 1650   LEUKOCYTESUR MODERATE* 01/03/2013 1650     Imaging: No results found.    Medications:    Infusions: . sodium chloride 20 mL/hr at 01/08/13 1600    Scheduled Medications: . allopurinol  200 mg Oral Daily  . amLODipine  5 mg Oral Daily  . aspirin EC  81 mg Oral Daily  . atorvastatin  20 mg Oral q1800  . atropine  1 drop Left Eye BID  . bimatoprost  1 drop Left Eye Daily  . citalopram  20 mg Oral Daily  . darbepoetin (ARANESP) injection - DIALYSIS  60 mcg Intravenous Q Thu-HD  . Difluprednate  1 drop Left Eye QID  . dorzolamide-timolol  1 drop Both Eyes BID  . feeding supplement  1 Container Oral BID BM  . furosemide  40 mg Oral BID  . heparin  5,000 Units Subcutaneous Q8H  . insulin aspart  0-9 Units Subcutaneous Q4H  . insulin glargine  6 Units Subcutaneous QHS  . polyvinyl alcohol  1 drop Left Eye BID  . sennosides  10 mL Oral QHS    PRN Medications: acetaminophen, hydrALAZINE, ondansetron (ZOFRAN) IV   Assessment/ Plan:    Pt is a 79 y.o. yo female with a PMHx of CKD, HTN, DM II, Gout who was admitted on 12/30/2012 for abdominal/flank pain and UTI as well as AKI  1) A/C renal failure. Baseline creatinine in the nid to high 1's. Thought due to UTI and hemodynamic changes with low BP.  UOP great.  Pt s/p 3 HD treatments (3/25, 3/26, 3/27). MS  Improved. Will watch daily UOP and creatinine to eval for HD need.No indications today. Has PC   2) HTN/Vol - IVF  stopped.  BP continues to stay elevated likely due to the fact that we were keeping her slightly overloaded to help with eventual recovery. Have added back norvasc and lasix anticipate continued improvement  3) Anemia -  Iron stores: T sat 46%, Fe 95, Ferritin 79; will continue Aranesp once a week for now.    4) UTI - completed rocephin  5) Dispo - Short term HD ok, but not for chronic OP dialysis per discussion with the family. We will follow for hopeful recovery, looking better today.  Length of Stay: 11 days   Cecille Aver, MD  01/10/2013, 9:32 AM

## 2013-01-10 NOTE — Progress Notes (Signed)
Subjective: [name] is up sitting in a chair. She is much more alert. Has not complaints.  Objective: Lab: Lab Results  Component Value Date   WBC 8.7 01/08/2013   HGB 10.7* 01/08/2013   HCT 31.3* 01/08/2013   MCV 80.7 01/08/2013   PLT 116* 01/08/2013   BMET    Component Value Date/Time   NA 137 01/10/2013 0655   K 3.3* 01/10/2013 0655   CL 99 01/10/2013 0655   CO2 27 01/10/2013 0655   GLUCOSE 102* 01/10/2013 0655   BUN 52* 01/10/2013 0655   CREATININE 2.51* 01/10/2013 0655   CALCIUM 9.2 01/10/2013 0655   GFRNONAA 17* 01/10/2013 0655   GFRAA 19* 01/10/2013 0655     Imaging:  Scheduled Meds: . allopurinol  200 mg Oral Daily  . amLODipine  5 mg Oral Daily  . aspirin EC  81 mg Oral Daily  . atorvastatin  20 mg Oral q1800  . atropine  1 drop Left Eye BID  . bimatoprost  1 drop Left Eye Daily  . citalopram  20 mg Oral Daily  . darbepoetin (ARANESP) injection - DIALYSIS  60 mcg Intravenous Q Thu-HD  . Difluprednate  1 drop Left Eye QID  . dorzolamide-timolol  1 drop Both Eyes BID  . feeding supplement  1 Container Oral BID BM  . furosemide  40 mg Oral BID  . heparin  5,000 Units Subcutaneous Q8H  . insulin aspart  0-9 Units Subcutaneous Q4H  . insulin glargine  6 Units Subcutaneous QHS  . polyvinyl alcohol  1 drop Left Eye BID  . sennosides  10 mL Oral QHS   Continuous Infusions: . sodium chloride 20 mL/hr at 01/08/13 1600   PRN Meds:.acetaminophen, hydrALAZINE, ondansetron (ZOFRAN) IV   Physical Exam: Filed Vitals:   01/10/13 0827  BP: 146/56  Pulse: 68  Temp: 98.2 F (36.8 C)  Resp: 18    Intake/Output Summary (Last 24 hours) at 01/10/13 1610 Last data filed at 01/10/13 9604  Gross per 24 hour  Intake    960 ml  Output   1675 ml  Net   -715 ml  UOP 3/29  1675 Gen'l - obese AA woman in no distress HEENT- no change Cor- 2+ radial, RRR Pulm - good breath sounds, normal work of breathing Abd- soft, BS+, no guarding or rebound Ext - trace edema Derm -  chronic leg changes w/o skin breakdown.      Assessment/Plan: 1. ID - Day #5 ceftiraxone. No fever Plan continue IV antibiotics  CBCD in AM  2. GI - stable. Good appetite. Plan Will advance diet  3. DM -  CBG (last 3)   Recent Labs  01/09/13 2335 01/10/13 0339 01/10/13 0742  GLUCAP 150* 107* 109*    Good control  4. HTN - Good control on resumed medications Plan  Continue present rgimen  5. Renal - much improved. Good UOP. Creatinine coming down Plan Supportive care. No indication for HD  6. Cardiac - stable  Dispo - for SNF. FL2 signed     Illene Regulus  IM (o) [phone]; (c) [phone] Call-grp - Patsi Sears IM  Tele: [phone]  01/10/2013, 9:17 AM

## 2013-01-11 LAB — RENAL FUNCTION PANEL
Albumin: 2.6 g/dL — ABNORMAL LOW (ref 3.5–5.2)
BUN: 48 mg/dL — ABNORMAL HIGH (ref 6–23)
Chloride: 99 mEq/L (ref 96–112)
Glucose, Bld: 103 mg/dL — ABNORMAL HIGH (ref 70–99)
Potassium: 3.2 mEq/L — ABNORMAL LOW (ref 3.5–5.1)

## 2013-01-11 LAB — GLUCOSE, CAPILLARY
Glucose-Capillary: 126 mg/dL — ABNORMAL HIGH (ref 70–99)
Glucose-Capillary: 164 mg/dL — ABNORMAL HIGH (ref 70–99)
Glucose-Capillary: 173 mg/dL — ABNORMAL HIGH (ref 70–99)
Glucose-Capillary: 177 mg/dL — ABNORMAL HIGH (ref 70–99)

## 2013-01-11 NOTE — Progress Notes (Signed)
Utilization review completed.  

## 2013-01-11 NOTE — Progress Notes (Signed)
Inpatient Diabetes Program Recommendations  AACE/ADA: New Consensus Statement on Inpatient Glycemic Control (2013)  Target Ranges:  Prepandial:   less than 140 mg/dL      Peak postprandial:   less than 180 mg/dL (1-2 hours)      Critically ill patients:  140 - 180 mg/dL    Results for [name], [name] (MRN [id number]) as of 01/11/2013 09:53  Ref. Range 01/09/2013 23:35 01/10/2013 03:39 01/10/2013 07:42 01/10/2013 11:38 01/10/2013 16:12 01/10/2013 20:36  Glucose-Capillary Latest Range: 70-99 mg/dL 409 (H) 811 (H) 914 (H) 280 (H) 237 (H) 181 (H)    Inpatient Diabetes Program Recommendations Correction (SSI): Please change Novolog correction scale (SSI) to Moderate scale tid ac + HS (currently ordered as Sensitive Q4 hours).  Will follow. Ambrose Finland RN, MSN, CDE Diabetes Coordinator Inpatient Diabetes Program [phone]

## 2013-01-11 NOTE — Progress Notes (Signed)
[facility]  Subjective:   Awake, sitting in chair.  Talking with daughter Mario M. Henry--from [place]) at bedside   Objective: Vital signs in last 24 hours: Blood pressure 145/58, pulse 65, temperature 98.4 F (36.9 C), temperature source Oral, resp. rate 18, height 5\' 5"  (1.651 m), weight 89.2 kg (196 lb 10.4 oz), SpO2 97.00%.    PHYSICAL EXAM General--as above Chest--tunneled dialysis cath still presernt in R IJ, clear Heart--no rub Abd--nontender Extr--trace edema, hyperpigmented skin on shins  Lab Results:   Recent Labs Lab 01/09/13 0500 01/10/13 0655 01/11/13 0650  NA 142 137 137  K 3.4* 3.3* 3.2*  CL 104 99 99  CO2 26 27 27   BUN 50* 52* 48*  CREATININE 2.85* 2.51* 2.21*  GLUCOSE 112* 102* 103*  CALCIUM 9.7 9.2 9.1  PHOS 3.6 2.8 2.4    No results found for this basename: WBC, HGB, HCT, PLT,  in the last 72 hours   I have reviewed the patient's current medications. Scheduled: . allopurinol  200 mg Oral Daily  . amLODipine  5 mg Oral Daily  . aspirin EC  81 mg Oral Daily  . atorvastatin  20 mg Oral q1800  . atropine  1 drop Left Eye BID  . bimatoprost  1 drop Left Eye Daily  . citalopram  20 mg Oral Daily  . darbepoetin (ARANESP) injection - DIALYSIS  60 mcg Intravenous Q Thu-HD  . Difluprednate  1 drop Left Eye QID  . dorzolamide-timolol  1 drop Both Eyes BID  . feeding supplement  1 Container Oral BID BM  . furosemide  40 mg Oral BID  . heparin  5,000 Units Subcutaneous Q8H  . insulin aspart  0-9 Units Subcutaneous Q4H  . insulin glargine  6 Units Subcutaneous QHS  . polyvinyl alcohol  1 drop Left Eye BID  . sennosides  10 mL Oral QHS   Continuous: . sodium chloride 20 mL/hr at 01/08/13 1600    Assessment/Plan: 1) Acute superimposed on chronic renal failure. Baseline creatinine in the nid to high 1's. Thought due to UTI and hemodynamic changes with low BP. UOP great. Pt s/p 3 HD treatments (3/25, 3/26, 3/27). MS Improved.  Renal fct improving.  Dialysis cath should be removed tomorrow if Cr continues to decline (placed in IR--they can remove) 2) HTN/Vol - ok today on amlodipine and furosemide  3) Anemia - Iron stores: T sat 46%, Fe 95, Ferritin 79; will continue Aranesp once a week for now.  4) UTI - completed rocephin  5) Dispo - Daughter thinks Mrs. Lambrecht will go to /SNF soon.       LOS: 12 days   Chanese Hartsough F 01/11/2013,1:16 PM   .labalb

## 2013-01-11 NOTE — Progress Notes (Signed)
Subjective: [name] is awake and alert. She has had a better day. She did eat yesterday and spent time in a chair. No abdominal pain.  Objective: Lab: Lab Results  Component Value Date   WBC 8.7 01/08/2013   HGB 10.7* 01/08/2013   HCT 31.3* 01/08/2013   MCV 80.7 01/08/2013   PLT 116* 01/08/2013   BMET    Component Value Date/Time   NA 137 01/10/2013 0655   K 3.3* 01/10/2013 0655   CL 99 01/10/2013 0655   CO2 27 01/10/2013 0655   GLUCOSE 102* 01/10/2013 0655   BUN 52* 01/10/2013 0655   CREATININE 2.51* 01/10/2013 0655   CALCIUM 9.2 01/10/2013 0655   GFRNONAA 17* 01/10/2013 0655   GFRAA 19* 01/10/2013 0655     Imaging:  Scheduled Meds: . allopurinol  200 mg Oral Daily  . amLODipine  5 mg Oral Daily  . aspirin EC  81 mg Oral Daily  . atorvastatin  20 mg Oral q1800  . atropine  1 drop Left Eye BID  . bimatoprost  1 drop Left Eye Daily  . citalopram  20 mg Oral Daily  . darbepoetin (ARANESP) injection - DIALYSIS  60 mcg Intravenous Q Thu-HD  . Difluprednate  1 drop Left Eye QID  . dorzolamide-timolol  1 drop Both Eyes BID  . feeding supplement  1 Container Oral BID BM  . furosemide  40 mg Oral BID  . heparin  5,000 Units Subcutaneous Q8H  . insulin aspart  0-9 Units Subcutaneous Q4H  . insulin glargine  6 Units Subcutaneous QHS  . polyvinyl alcohol  1 drop Left Eye BID  . sennosides  10 mL Oral QHS   Continuous Infusions: . sodium chloride 20 mL/hr at 01/08/13 1600   PRN Meds:.acetaminophen, hydrALAZINE, ondansetron (ZOFRAN) IV   Physical Exam: Filed Vitals:   01/11/13 0440  BP: 145/58  Pulse: 65  Temp: 98.4 F (36.9 C)  Resp: 18    Intake/Output Summary (Last 24 hours) at 01/11/13 0716 Last data filed at 01/11/13 0441  Gross per 24 hour  Intake    760 ml  Output   1100 ml  Net   -340 ml  UOP 01/10/13  1,100 cc  Gen'l- overweight AA woman in no distress Cor- RRR, II/VI murmur RSB Pulm - normal respirations Abd - obese, soft, BS+, no guarding or  rebound       Assessment/Plan: 1. ID - Day #6/7 ceftriaxone. No fever, feeling well Plan  finish ceftriaxone  CBCD tonight  2. GI - stable - taking a diet  3. DM  CBG (last 3)   Recent Labs  01/10/13 2036 01/11/13 0034 01/11/13 0408  GLUCAP 181* 126* 114*    Reasonable control  4. HTN  Adequate control  5. Renal - AM labs pending. Has been doing well. Good UOP. Uremic encephalopathy cleared  6. Cardiac - stable  Dispo - transfer to SNF in AM   [employer] IM (o) [phone]; (c) [phone] Call-grp - Patsi Sears IM  Tele: [phone]  01/11/2013, 7:15 AM

## 2013-01-12 ENCOUNTER — Inpatient Hospital Stay (HOSPITAL_COMMUNITY): Payer: Medicare Other

## 2013-01-12 DIAGNOSIS — D649 Anemia, unspecified: Secondary | ICD-10-CM

## 2013-01-12 LAB — CBC WITH DIFFERENTIAL/PLATELET
Basophils Absolute: 0 10*3/uL (ref 0.0–0.1)
Eosinophils Absolute: 0.2 10*3/uL (ref 0.0–0.7)
HCT: 30.1 % — ABNORMAL LOW (ref 36.0–46.0)
Lymphocytes Relative: 7 % — ABNORMAL LOW (ref 12–46)
Lymphs Abs: 0.8 10*3/uL (ref 0.7–4.0)
MCH: 27.1 pg (ref 26.0–34.0)
MCHC: 33.6 g/dL (ref 30.0–36.0)
MCV: 80.7 fL (ref 78.0–100.0)
Monocytes Absolute: 1.1 10*3/uL — ABNORMAL HIGH (ref 0.1–1.0)
Neutro Abs: 9.7 10*3/uL — ABNORMAL HIGH (ref 1.7–7.7)
RDW: 20.2 % — ABNORMAL HIGH (ref 11.5–15.5)

## 2013-01-12 LAB — BASIC METABOLIC PANEL
BUN: 44 mg/dL — ABNORMAL HIGH (ref 6–23)
Chloride: 99 mEq/L (ref 96–112)
Creatinine, Ser: 2.09 mg/dL — ABNORMAL HIGH (ref 0.50–1.10)
GFR calc Af Amer: 24 mL/min — ABNORMAL LOW (ref >90)

## 2013-01-12 LAB — GLUCOSE, CAPILLARY
Glucose-Capillary: 152 mg/dL — ABNORMAL HIGH (ref 70–99)
Glucose-Capillary: 199 mg/dL — ABNORMAL HIGH (ref 70–99)
Glucose-Capillary: 220 mg/dL — ABNORMAL HIGH (ref 70–99)

## 2013-01-12 MED ORDER — COLCHICINE 0.6 MG PO TABS: 0.6000 mg | ORAL_TABLET | ORAL | Status: DC | PRN

## 2013-01-12 MED ORDER — ALLOPURINOL 100 MG PO TABS: 200.0000 mg | ORAL_TABLET | Freq: Every day | ORAL | Status: DC

## 2013-01-12 MED ORDER — POTASSIUM CHLORIDE ER 10 MEQ PO TBCR: 20.0000 meq | EXTENDED_RELEASE_TABLET | Freq: Every day | ORAL | Status: DC

## 2013-01-12 MED ORDER — POTASSIUM CHLORIDE CRYS ER 20 MEQ PO TBCR
40.0000 meq | EXTENDED_RELEASE_TABLET | Freq: Once | ORAL | Status: AC
  Administered 2013-01-12: 40 meq via ORAL
  Filled 2013-01-12: qty 2

## 2013-01-12 NOTE — Procedures (Signed)
Successful removal of rt IJ Permcath No complications

## 2013-01-12 NOTE — Progress Notes (Signed)
[facility] - PROGRESS NOTE Resident Note   Please see below for attending addendum to resident note.  Subjective:   Awake, sitting upright in bed, eating and talking with nurse.   Objective:    Vital Signs:   Temp:  [98.4 F (36.9 C)-99.2 F (37.3 C)] 98.6 F (37 C) (04/01 0355) Pulse Rate:  [63-64] 64 (04/01 0355) Resp:  [16-17] 16 (04/01 0355) BP: (134-158)/(60-69) 158/60 mmHg (04/01 0355) SpO2:  [97 %-100 %] 100 % (04/01 0355) Weight:  [89.2 kg (196 lb 10.4 oz)] 89.2 kg (196 lb 10.4 oz) (03/31 2002) Last BM Date: 01/11/13  24-hour weight change: Weight change:   Weight trends: Filed Weights   01/07/13 2026 01/09/13 2139 01/11/13 2002  Weight: 91.3 kg (201 lb 4.5 oz) 89.2 kg (196 lb 10.4 oz) 89.2 kg (196 lb 10.4 oz)    Intake/Output:  03/31 0701 - 04/01 0700 In: 945.3 [P.O.:480; I.V.:465.3] Out: 1625 [Urine:1625]  Physical Exam: General: Vital signs reviewed and noted. Well-developed, well-nourished, in no acute distress; alert, appropriate and cooperative throughout examination.  Lungs:  Normal respiratory effort. Clear to auscultation BL without crackles or wheezes.  Heart: RRR. S1 and S2 normal without gallop, murmur, or rubs.  Abdomen:  BS normoactive. Soft, Nondistended, non-tender.  No masses or organomegaly.  Extremities: Trace edema BL, hyperpigmentation of skin on shins BL.     Labs: Basic Metabolic Panel:  Recent Labs Lab 01/09/13 0500 01/10/13 0655 01/11/13 0650 01/11/13 2345  NA 142 137 137 137  K 3.4* 3.3* 3.2* 3.0*  CL 104 99 99 99  CO2 26 27 27 29   GLUCOSE 112* 102* 103* 155*  BUN 50* 52* 48* 44*  CREATININE 2.85* 2.51* 2.21* 2.09*  CALCIUM 9.7 9.2 9.1 9.0  PHOS 3.6 2.8 2.4  --     Liver Function Tests:  Recent Labs Lab 01/09/13 0500 01/10/13 0655 01/11/13 0650  ALBUMIN 2.8* 2.5* 2.6*   No results found for this basename: LIPASE, AMYLASE,  in the last 168 hours No results found for this basename: AMMONIA,  in  the last 168 hours  CBC:  Recent Labs Lab 01/07/13 1300 01/08/13 1030 01/11/13 2345  WBC 8.6 8.7 11.8*  NEUTROABS 7.1  --  9.7*  HGB 9.5* 10.7* 10.1*  HCT 28.4* 31.3* 30.1*  MCV 80.9 80.7 80.7  PLT 144* 116* 97*    Cardiac Enzymes: No results found for this basename: CKTOTAL, CKMB, TROPONINI,  in the last 168 hours  BNP: No components found with this basename: POCBNP,   CBG:  Recent Labs Lab 01/11/13 1223 01/11/13 1609 01/11/13 2005 01/11/13 2345 01/12/13 0359  GLUCAP 238* 173* 164* 177* 124*    Microbiology: Results for orders placed during the hospital encounter of 12/30/12  URINE CULTURE     Status: None   Collection Time    12/30/12 10:12 PM      Result Value Range Status   Specimen Description URINE, CATHETERIZED   Final   Special Requests NONE   Final   Culture  Setup Time 12/30/2012 23:10   Final   Colony Count >=100,000 COLONIES/ML   Final   Culture ESCHERICHIA COLI   Final   Report Status 01/02/2013 FINAL   Final   Organism ID, Bacteria ESCHERICHIA COLI   Final  CULTURE, BLOOD (ROUTINE X 2)     Status: None   Collection Time    01/07/13  1:25 PM      Result Value Range Status   Specimen Description  BLOOD HEMODIALYSIS CATHETER   Final   Special Requests BOTTLES DRAWN AEROBIC AND ANAEROBIC 10CC   Final   Culture  Setup Time 01/07/2013 18:04   Final   Culture     Final   Value:        BLOOD CULTURE RECEIVED NO GROWTH TO DATE CULTURE WILL BE HELD FOR 5 DAYS BEFORE ISSUING A FINAL NEGATIVE REPORT   Report Status PENDING   Incomplete  CULTURE, BLOOD (ROUTINE X 2)     Status: None   Collection Time    01/07/13  1:30 PM      Result Value Range Status   Specimen Description BLOOD HEMODIALYSIS CATHETER   Final   Special Requests BOTTLES DRAWN AEROBIC AND ANAEROBIC 10CC   Final   Culture  Setup Time 01/07/2013 18:04   Final   Culture     Final   Value:        BLOOD CULTURE RECEIVED NO GROWTH TO DATE CULTURE WILL BE HELD FOR 5 DAYS BEFORE ISSUING A FINAL  NEGATIVE REPORT   Report Status PENDING   Incomplete    Coagulation Studies: No results found for this basename: LABPROT, INR,  in the last 72 hours  Urinalysis:    Component Value Date/Time   COLORURINE ORANGE* 01/03/2013 1650   APPEARANCEUR CLOUDY* 01/03/2013 1650   LABSPEC 1.028 01/03/2013 1650   PHURINE 5.0 01/03/2013 1650   GLUCOSEU 100* 01/03/2013 1650   HGBUR NEGATIVE 01/03/2013 1650   BILIRUBINUR MODERATE* 01/03/2013 1650   KETONESUR 15* 01/03/2013 1650   PROTEINUR NEGATIVE 01/03/2013 1650   UROBILINOGEN 0.2 01/03/2013 1650   NITRITE NEGATIVE 01/03/2013 1650   LEUKOCYTESUR MODERATE* 01/03/2013 1650     Imaging: No results found.    Medications:    Infusions: . sodium chloride 20 mL/hr at 01/08/13 1600    Scheduled Medications: . allopurinol  200 mg Oral Daily  . amLODipine  5 mg Oral Daily  . aspirin EC  81 mg Oral Daily  . atorvastatin  20 mg Oral q1800  . atropine  1 drop Left Eye BID  . bimatoprost  1 drop Left Eye Daily  . citalopram  20 mg Oral Daily  . darbepoetin (ARANESP) injection - DIALYSIS  60 mcg Intravenous Q Thu-HD  . Difluprednate  1 drop Left Eye QID  . dorzolamide-timolol  1 drop Both Eyes BID  . feeding supplement  1 Container Oral BID BM  . furosemide  40 mg Oral BID  . heparin  5,000 Units Subcutaneous Q8H  . insulin aspart  0-9 Units Subcutaneous Q4H  . insulin glargine  6 Units Subcutaneous QHS  . polyvinyl alcohol  1 drop Left Eye BID  . sennosides  10 mL Oral QHS    PRN Medications: acetaminophen, hydrALAZINE, ondansetron (ZOFRAN) IV   Assessment/ Plan:    Pt is a 79 y.o. yo female with a PMHx of CKD, HTN, DM II, Gout who was admitted on 12/30/2012 for abdominal/flank pain and UTI as well as AKI.  1) Acute superimposed on chronic renal failure. Baseline creatinine in the mid to high 1's. Thought due to UTI and hemodynamic changes with low BP. UOP continues to be great. Pt s/p 3 HD treatments (3/25, 3/26, 3/27). MS significantly  Improved. Renal fxn continuing to improve. Dialysis cath can be removed today as Cr is 2.09, continuing to trend down.  2) HTN/Vol - ok today on amlodipine and furosemide  3) Anemia - Iron stores: T sat 46%, Fe 95, Ferritin 79;  will continue Aranesp once a week for now.  4) UTI - completed rocephin  5) Dispo - Daughter and social worker anticipate Mrs. Corriher will go to SNF soon  Length of Stay: 13 days  Patient history and plan of care reviewed with attending, Dr. Marina Gravel.   Winnifred Friar, Student-PA  01/12/2013, 8:43 AM I saw and examined [name].  Discussed with J Vyas--PA student. I agree with plans as outlined above.  Will have  R IJ TDC removed in IR and foley cath to come out, too.  Discussed with Dr. Debby Bud.

## 2013-01-12 NOTE — Progress Notes (Signed)
Subjective: Sitting on the side of the bed, awake, laughing at my jokes and feeling better.  Objective: Lab: Lab Results  Component Value Date   WBC 11.8* 01/11/2013   HGB 10.1* 01/11/2013   HCT 30.1* 01/11/2013   MCV 80.7 01/11/2013   PLT 97* 01/11/2013   BMET    Component Value Date/Time   NA 137 01/11/2013 2345   K 3.0* 01/11/2013 2345   CL 99 01/11/2013 2345   CO2 29 01/11/2013 2345   GLUCOSE 155* 01/11/2013 2345   BUN 44* 01/11/2013 2345   CREATININE 2.09* 01/11/2013 2345   CALCIUM 9.0 01/11/2013 2345   GFRNONAA 21* 01/11/2013 2345   GFRAA 24* 01/11/2013 2345     Imaging:  Scheduled Meds: . allopurinol  200 mg Oral Daily  . amLODipine  5 mg Oral Daily  . aspirin EC  81 mg Oral Daily  . atorvastatin  20 mg Oral q1800  . atropine  1 drop Left Eye BID  . bimatoprost  1 drop Left Eye Daily  . citalopram  20 mg Oral Daily  . darbepoetin (ARANESP) injection - DIALYSIS  60 mcg Intravenous Q Thu-HD  . Difluprednate  1 drop Left Eye QID  . dorzolamide-timolol  1 drop Both Eyes BID  . feeding supplement  1 Container Oral BID BM  . furosemide  40 mg Oral BID  . heparin  5,000 Units Subcutaneous Q8H  . insulin aspart  0-9 Units Subcutaneous Q4H  . insulin glargine  6 Units Subcutaneous QHS  . polyvinyl alcohol  1 drop Left Eye BID  . sennosides  10 mL Oral QHS   Continuous Infusions: . sodium chloride 20 mL/hr at 01/08/13 1600   PRN Meds:.acetaminophen, hydrALAZINE, ondansetron (ZOFRAN) IV   Physical Exam: Filed Vitals:   01/12/13 0355  BP: 158/60  Pulse: 64  Temp: 98.6 F (37 C)  Resp: 16   Gen'l;- OBese AA woman in no distress Cor- RRR Pulm - normal respiration, Lungs CTAP Abd- soft     Assessment/Plan: 1. ID Day #7/7 rocephin. No fever. Plan CBCD  2,3,4 - stable  5. Renal - continued improvement - last Cr 2.0  dispo - transfer to SNF today. Dictated # [id number]  Greater than 30 minutes spent examining patient and preparing discharge data Illene Regulus [facility] (o) [phone]; (c) [phone] Call-grp - Patsi Sears IM  Tele: [phone]  01/12/2013, 8:25 AM

## 2013-01-12 NOTE — Discharge Summary (Signed)
NAME:  Loschiavo, Avalin              ACCOUNT NO.:  [id number]  MEDICAL RECORD NO.:  [id number]  LOCATION:  6706                         FACILITY:  [facility]  PHYSICIAN:  [name]. Jearld Hemp, MD  DATE OF BIRTH:  1928/05/31  DATE OF ADMISSION:  12/30/2012 DATE OF DISCHARGE:  01/12/2013                              DISCHARGE SUMMARY   Transferred to skilled care on January 12, 2013.  ADMITTING DIAGNOSES: 1. Urinary tract infection. 2. Abdominal pain.  DISCHARGE DIAGNOSES: 1. Urinary tract infection, resolved. 2. Abdominal pain, resolved. 3. Acute renal failure, resolved.  CONSULTANTS:  Dr. Cecille Aver, for Nephrology.  PROCEDURES:  CT of the abdomen and pelvis, December 31, 2012, showing no acute abnormalities involving the abdomen or pelvis, small hiatal hernia, diffuse colonic diverticulosis, stable mild scarring of both kidneys, stable pancreatic atrophy, atrophic uterus.  Ultrasound renal performed on December 31, 2012, with no evidence of hydronephrosis involving either kidney, mild diffuse cortical thinning is noted.  Portable chest x-ray, January 04, 2013, which showed a question of congestive heart failure, superimposed multilobar pneumonia, or sequelae of aspiration is not excluded.  Chest x-ray January 06, 2013, which showed cardiomegaly with mild interstitial edema, improved, bilateral lower lobe opacities likely atelectasis.  Renal procedures; hemodialysis x3.  HISTORY OF PRESENT ILLNESS:  Ms. [name] is an 79 year old African American woman followed for multiple medical problems including coronary artery disease, renal artery stenosis, hypertension, dyslipidemia, GERD, tension headache, abdominal pain.  She presented to the emergency department with a 1-week history of left-sided abdominal pain and flank pain.  The patient is reporting her symptoms are progressively worsening over the last several days prior to admission.  She developed nausea and vomiting on the  night prior to admission with nonbilious, nonbloody emesis.  In the ED, she was found to have a significant UTI and was started on Rocephin and was admitted to the hospital for care.  Please see the H and P for past medical history, family history, and social history.  HOSPITAL COURSE: 1. ID.  The patient was initially treated with Rocephin.  On the     fourth hospital day, her antibiotics were stopped, because of renal     failure, and a question of whether it was an antibiotic related     problem.  The patient was able to resume antibiotics after being     stabilized with dialysis and did complete 7 of 7 additional days of     ceftriaxone.  The patient remained afebrile.  She had no urinary     tract symptoms.  Final CBC with diff from January 11, 2013 with a     white count of 11,800, with 82% segs, 7% lymphs, 9% monos, 2%     eosinophils, hemoglobin 10.1 g.  The patient having completed her     antibiotics with her being asymptomatic and with a white count     being very close to normal.  At this point, she is stable and     considered ready for discharge/transfer without additional     antibiotic therapy.  2. Abdominal pain.  The patient was thoroughly evaluated with no     evidence of any  acute intra-abdominal problem or process.  She did     recover with resolution of her abdominal pain.  She has been able     to take a diet without difficulty.  3. Diabetes.  The patient was followed with sliding scale with     reasonable control.  At time of discharge, she will continue her     home medications.  4. Hypertension.  The patient's blood pressure has been well     controlled with resumption of her home medications, which are     detailed below.  5. Renal.  The patient had acute renal failure necessitating dialysis     x3.  Etiology was hypotension associated with her acute infectious     process.  No other underlying etiology was determined.  The patient     did make a good  recovery with her creatinine approaching her     baseline at 2.0 at time of discharge.  The patient did have a     transient episode or uremic encephalopathy, which is totally     cleared.  6. Cardiac.  The patient has remained cardiac stable with no chest     pain, chest discomfort.  At this point, the patient's acute problems being resolved, she is     ready for transfer to skilled care for rehabilitation and     strengthening and with the idea of returning to home in 10-14 days.  DISCHARGE EXAMINATION:  VITAL SIGNS:  Temperature was 98.6, blood pressure 158/60, pulse was 64, respirations 16, O2 saturation 100% on room air. GENERAL APPEARANCE:  This is an elderly obese African American woman sitting on the side of the bed eating her breakfast, in no distress. 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  Left eye with visible cataract.  Conjunctiva and sclerae were clear.  Oropharynx without lesions. NECK:  Supple.  No thyromegaly appreciated. CHEST:  The patient has normal chest with no deformities. PULMONARY:  The patient has no respiratory distress with normal respirations.  No rales or wheezes are appreciated. CARDIOVASCULAR:  2+ radial pulse.  Precordium is quiet.  Heart sounds are distant, but regular. ABDOMEN:  Obese with positive bowel sounds.  No guarding or rebound. GENITALIA:  Deferred. EXTREMITIES:  Without significant deformity. DERM:  The patient has chronic skin changes of the distal lower extremities with a darkening and thickening of the skin.  No other areas of skin breakdown are noted. NEURO:  The patient is awake, alert.  She is oriented.  She is laughing. She understands her situation and appears to be at her baseline.  FINAL LABORATORY:  CBC from January 11, 2013, with a white count of 11,800, hemoglobin 10.1 g, platelet count 97,000, differential was unremarkable.  The patient does have large platelets.  Final  chemistry from January 11, 2013, with today's labs pending with a sodium 137, potassium is 3.0, chloride was 99, CO2 of 29, BUN of 44, creatinine 2.09, and glucose was 155.  Iron panel from January 07, 2013, with an iron of 98, TIBC of 210, iron percent saturation of 47%.  INR on January 05, 2013, was 1.2.  Last A1c from December 31, 2012, was 7%.  TSH not performed this admission.  DISCHARGE MEDICATIONS: 1. Allopurinol 200 mg daily. 2. Amlodipine 5 mg daily. 3. Aspirin 81 mg daily. 4. Atropine 1% ophthalmic solution 2 drops to the left eye daily. 5. Lumigan 0.03% ophthalmic solution 1 drop in  the left eye daily. 6. Celexa 20 mg daily. 7. Colchicine 0.6 mg two tablets p.r.n. for acute flare of gout. 8. Valium 5 mg q.6 hours p.r.n. anxiety. 9. Cosopt eye drops, 1 drop in both eyes b.i.d. 10.Vitamin D 50,000 units weekly until seen in followup. 11.Lasix 80 mg daily. 12.Neurontin 600 mg tablets 3 times daily. 13.Hydralazine 100 mg t.i.d. for management of hypertension. 14.Lantus insulin 6 units subcutaneous at bedtime. 15.Lisinopril 4 units subcutaneous before meals. 16.Lopressor 25 mg tablets b.i.d. 17.Nitroglycerin 0.4 mg sublingual p.r.n. 18.Protonix 40 mg q.a.m. 19.Pravachol 80 mg daily. 20.Muro 128, 5% ophthalmic solution 1 drop in left eye b.i.d.  DISPOSITION:  The patient is to be transferred to a skilled care facility for rehabilitation.  Anticipated 10-14 days' stay.  The patient is to see Dr. Debby Bud as an outpatient and follow up within 7 days of discharge from nursing facility.  The patient's condition at time of discharge dictation is medically stable, but guarded given her age and multiple medical problems and comorbidities.     [name] Kaylena Pacifico, MD     MEN/MEDQ  D:  01/12/2013  T:  01/12/2013  Job:  [id number]

## 2013-01-12 NOTE — Discharge Summary (Signed)
NAME:  [name], [name]              ACCOUNT NO.:  [id number]  MEDICAL RECORD NO.:  [id number]  LOCATION:  6706                         FACILITY:  [facility]  PHYSICIAN:  [name]. Zachrey Deutscher, MD  DATE OF BIRTH:  [birth year]  DATE OF ADMISSION:  12/30/2012 DATE OF DISCHARGE:                              DISCHARGE SUMMARY   ADDENDUM:  The patient's last potassium was 3.0.  The patient was administered 40 mEq of potassium on the day of discharge, and potassium 20 mEq daily was added to her med list.     [name]. Youcef Klas, MD     MEN/MEDQ  D:  01/12/2013  T:  01/12/2013  Job:  [id number]

## 2013-01-13 LAB — GLUCOSE, CAPILLARY: Glucose-Capillary: 142 mg/dL — ABNORMAL HIGH (ref 70–99)

## 2013-01-13 LAB — CULTURE, BLOOD (ROUTINE X 2): Culture: NO GROWTH

## 2013-01-13 NOTE — Progress Notes (Signed)
Report call to [facility], RN.  Patient transported via EMS in no acute distress.  Family was notified via voice mail of patient transport.  Flowers X 3 left at front desk for family to pick up.

## 2013-01-14 ENCOUNTER — Other Ambulatory Visit: Payer: Self-pay | Admitting: *Deleted

## 2013-01-14 MED ORDER — DIAZEPAM 5 MG PO TABS: 5.0000 mg | ORAL_TABLET | Freq: Four times a day (QID) | ORAL | Status: DC | PRN

## 2013-01-15 ENCOUNTER — Non-Acute Institutional Stay: Payer: Self-pay | Admitting: Internal Medicine

## 2013-01-15 NOTE — Progress Notes (Signed)
Patient ID: [name], female   DOB: 12/25/1927, 79 y.o.   MRN: [id number]  History and Physical  [name]:[id number] DOB: 1928-02-22 DOA: (Not on file)  PCP: [name] Regulus, MD   Chief Complaint: New admission post hospitalization  Recent hospitalization [facility]: 3/19-/10/3922  HPI:  79 year old African American female we've possibly history of coronary artery disease, hypertension, dyslipidemia, GERD, and renal artery stenosis who was admitted to was his [facility] with symptoms of left-sided abdominal and flank pain for one week duration. She also had nausea and vomiting one day prior to hospitalization. She was found to have significant UTI on urinalysis and started on Rocephin and admitted to medical floor . However on the fourth day of her hospitalization she started developing worsening renal failure and Rocephin was discontinued. She had a significant acute kidney injury  to the point that she needed dialysis for 3 days. She did completed a total of 7 days of Rocephin. She was afebrile and improved white count prior to discharge. Her dialysis catheter was removed prior to discharge. Her creatinine had improved to 2 upon discharge. Due to prolonged hospitalization and weakness she was evaluated by physical therapy who recommended that she would need 2 weeks of skilled nursing for strength training prior to returning home. On evaluation today patient informs feeling fatigued but denies any headache, cough, subjective fever, chills, abdominal pain, nausea, vomiting, chest pain, palpitations, shortness of breath, bowel or urinary symptoms. She was however noted to be febrile to 101.2 last evening. She informs being able to participate in physical therapy and her appetite to be better. Denies any further abdominal pain.   Review of Systems:  Constitutional: Denies fever, chills, diaphoresis, appetite change. Informs being fatigued with generalized weakness   HEENT: Denies photophobia, eye pain, redness, hearing loss, ear pain, congestion, sore throat, rhinorrhea, sneezing, mouth sores, trouble swallowing, neck pain, neck stiffness and tinnitus.   Respiratory: Denies SOB, DOE, cough, chest tightness,  and wheezing.   Cardiovascular: Denies chest pain, palpitations and leg swelling.  Gastrointestinal: Denies nausea, vomiting, abdominal pain, diarrhea, constipation, blood in stool and abdominal distention.  Genitourinary: Denies dysuria, urgency, frequency, hematuria, flank pain and difficulty urinating.  Musculoskeletal: Denies myalgias, back pain, joint swelling, arthralgias and gait problem.  Skin: Denies pallor, rash and wound.  Neurological: Denies dizziness, seizures, syncope, weakness, light-headedness, numbness and headaches.  Hematological: Denies adenopathy. Easy bruising, personal or family bleeding history  Psychiatric/Behavioral: Denies suicidal ideation, mood changes, confusion, nervousness, sleep disturbance and agitation   Past Medical History  Diagnosis Date  . Coronary artery disease   . Renal artery stenosis   . Hypertension   . Dyslipidemia   . GERD (gastroesophageal reflux disease)   . Tension headache   . Abdominal pain   . Anemia, unspecified   . Gout   . Back pain, chronic   . History of tonsillectomy   . History of cholecystectomy   . Depression   . IDDM (insulin dependent diabetes mellitus)   . Diabetes mellitus   . CHF (congestive heart failure)    Past Surgical History  Procedure Laterality Date  . Tonsillectomy    . Cholecystectomy     Social History:  reports that she has never smoked. She does not have any smokeless tobacco history on file. She reports that she does not drink alcohol or use illicit drugs.  No Known Allergies  Family History  Problem Relation Age of Onset  . Coronary artery disease Father   .  Hypertension Father   . Heart attack Father   . Hyperlipidemia Father   . Diabetes  Sister   . Hypertension Sister   . Hyperlipidemia Sister   . Hypertension Brother   . Diabetes Brother   . Hyperlipidemia Brother   . Cancer Neg Hx     Breast and colon    Prior to Admission medications   Medication Sig Start Date End Date Taking? Authorizing Provider  allopurinol (ZYLOPRIM) 100 MG tablet Take 2 tablets (200 mg total) by mouth daily. 01/12/13   Jacques Navy, MD  amLODipine (NORVASC) 5 MG tablet Take 5 mg by mouth daily.    Historical Provider, MD  aspirin 81 MG tablet Take 81 mg by mouth daily.      Historical Provider, MD  atropine 1 % ophthalmic solution Place 1 drop into the left eye 2 (two) times daily.    Historical Provider, MD  bimatoprost (LUMIGAN) 0.03 % ophthalmic solution Place 1 drop into the left eye daily.     Historical Provider, MD  citalopram (CELEXA) 20 MG tablet Take 20 mg by mouth daily.      Historical Provider, MD  colchicine 0.6 MG tablet Take 1 tablet (0.6 mg total) by mouth as needed. Takes 2 tablets daily as needed for gout flareup. 01/12/13   Jacques Navy, MD  diazepam (VALIUM) 5 MG tablet Take 1 tablet (5 mg total) by mouth every 6 (six) hours as needed for anxiety. 01/14/13   Claudie Revering, NP  dorzolamide-timolol (COSOPT) 22.3-6.8 MG/ML ophthalmic solution Place 1 drop into both eyes 2 (two) times daily.    Historical Provider, MD  ergocalciferol (VITAMIN D2) 50000 UNITS capsule Take 50,000 Units by mouth once a week.      Historical Provider, MD  furosemide (LASIX) 80 MG tablet Take 80 mg by mouth daily.    Historical Provider, MD  gabapentin (NEURONTIN) 600 MG tablet Take 1 tablet (600 mg total) by mouth 3 (three) times daily. 07/12/11   Jacques Navy, MD  hydrALAZINE (APRESOLINE) 100 MG tablet Take 100 mg by mouth 3 (three) times daily.    Historical Provider, MD  insulin glargine (LANTUS) 100 UNIT/ML injection Inject 6 Units into the skin at bedtime.    Historical Provider, MD  insulin lispro (HUMALOG) 100 UNIT/ML injection Inject 4  Units into the skin 3 (three) times daily before meals.    Historical Provider, MD  metoprolol tartrate (LOPRESSOR) 25 MG tablet Take 75 mg by mouth 2 (two) times daily.    Historical Provider, MD  nitroGLYCERIN (NITROSTAT) 0.4 MG SL tablet Place 0.4 mg under the tongue as needed. For chest pain.     Historical Provider, MD  pantoprazole (PROTONIX) 40 MG tablet Take 40 mg by mouth daily.    Historical Provider, MD  potassium chloride (K-DUR) 10 MEQ tablet Take 2 tablets (20 mEq total) by mouth daily. 01/12/13   Jacques Navy, MD  pravastatin (PRAVACHOL) 80 MG tablet Take 80 mg by mouth at bedtime.      Historical Provider, MD  sodium chloride (MURO 128) 5 % ophthalmic solution Place 1 drop into the left eye 2 (two) times daily.    Historical Provider, MD    Physical Exam:  Filed Vitals:   01/15/13 1110  BP: 112/62  Pulse: 62  Temp: 97.7 F (36.5 C)  Resp: 20  SpO2: 97%    Constitutional: Vital signs reviewed.  Patient is a well-developed and well-nourished in no acute distress and  cooperative with exam. Alert and oriented x3.  Head: Normocephalic and atraumatic Ear: Normal external ear Mouth: no erythema or exudates, MMM Eyes: PERRL, EOMI, conjunctivae normal, No scleral icterus.  Neck: Supple, Trachea midline normal ROM, No JVD, mass, thyromegaly, or carotid bruit present.  Cardiovascular: RRR, S1 normal, S2 normal, no MRG, pulses symmetric and intact bilaterally Pulmonary/Chest: CTAB, no wheezes, rales, or rhonchi Abdominal: Soft. Non-tender, non-distended, bowel sounds are normal, no masses, organomegaly, or guarding present.  GU: no CVA tenderness Musculoskeletal: No joint deformities, erythema, or stiffness, ROM full and no nontender Ext: no edema and no cyanosis, pulses palpable bilaterally  Hematology: no cervical adenopathy  Neurological: A&O x3, normal motor tone and power, normal sensations bilaterally Skin: Warm, dry and intact. No rash, cyanosis, or clubbing.   Psychiatric: Normal mood and affect. speech and behavior is normal. Judgment and thought content normal. Cognition and memory are normal.   Labs on Admission:  Basic Metabolic Panel:  Recent Labs Lab 01/09/13 0500 01/10/13 0655 01/11/13 0650 01/11/13 2345  NA 142 137 137 137  K 3.4* 3.3* 3.2* 3.0*  CL 104 99 99 99  CO2 26 27 27 29   GLUCOSE 112* 102* 103* 155*  BUN 50* 52* 48* 44*  CREATININE 2.85* 2.51* 2.21* 2.09*  CALCIUM 9.7 9.2 9.1 9.0  PHOS 3.6 2.8 2.4  --    Liver Function Tests:  Recent Labs Lab 01/09/13 0500 01/10/13 0655 01/11/13 0650  ALBUMIN 2.8* 2.5* 2.6*   No results found for this basename: LIPASE, AMYLASE,  in the last 168 hours No results found for this basename: AMMONIA,  in the last 168 hours CBC:  Recent Labs Lab 01/11/13 2345  WBC 11.8*  NEUTROABS 9.7*  HGB 10.1*  HCT 30.1*  MCV 80.7  PLT 97*   Cardiac Enzymes: No results found for this basename: CKTOTAL, CKMB, CKMBINDEX, TROPONINI,  in the last 168 hours BNP: No components found with this basename: POCBNP,  CBG:  Recent Labs Lab 01/12/13 1638 01/12/13 2023 01/12/13 2351 01/13/13 0422 01/13/13 0756  GLUCAP 152* 226* 199* 142* 145*      Assessment/Plan  Acute kidney injury requiring temporary dialysis Likely secondary to underlying UTI versus hypertension. She required 3 sessions of dialysis and her renal function started to improve progressively. Her creatinine was around 2 prior to discharge. Heart dialysis catheter was then removed and patient clinically stable at this time . I have ordered for a repeat BMET to reevaluate her renal function.  and also her potassium was 3 upon discharge and this needs to be followed. Avoiding nephrotoxins.  UTI Patient treated with 7 days of IV ceftriaxone. Urine culture grew Escherichia coli which was sensitive to the antibiotic.  Fever on 4/3 Patient had a maximum temperature of 101.26F last evening. Denies any dysuria, abdominal pain,  chills, cough or shortness of breath. Denies any diarrhea. I have ordered for a repeat UA, urine culture and blood culture to evaluate.  Diabetes mellitus Her glucose is stable. Continue with Lantus and pre-meal aspart.  Hypertension Blood pressure well controlled. Continue amlodipine, hydralazine, metoprolol and Lasix.  Coronary artery disease Continue aspirin, metoprolol.  GERD Continue PPI  Anemia Borderline in saturation. Getting Aranesp once a week as per renal  Code Status: Full code  Activities of daily living Feeds independently, dresses independently, needs assist with toileting, bathing and ambulation  Rehabilitation potential: Good  Labs ordered BMET to check for creatinine and potassium levels, blood cultures, UA and urine culture for fever workup  Followup Patient will be followed up periodically in skilled nursing facility. She will follow up with her PCP within 1-2 upon discharge home.  Avary Eichenberger  01/15/2013, 11:12 AM     Total time spent: 60 minutes

## 2013-01-25 ENCOUNTER — Non-Acute Institutional Stay (SKILLED_NURSING_FACILITY): Payer: Medicare Other | Admitting: Nurse Practitioner

## 2013-01-25 ENCOUNTER — Telehealth: Payer: Self-pay

## 2013-01-25 ENCOUNTER — Encounter: Payer: Self-pay | Admitting: Nurse Practitioner

## 2013-01-25 DIAGNOSIS — D649 Anemia, unspecified: Secondary | ICD-10-CM

## 2013-01-25 DIAGNOSIS — N182 Chronic kidney disease, stage 2 (mild): Secondary | ICD-10-CM

## 2013-01-25 DIAGNOSIS — E109 Type 1 diabetes mellitus without complications: Secondary | ICD-10-CM

## 2013-01-25 DIAGNOSIS — S31000S Unspecified open wound of lower back and pelvis without penetration into retroperitoneum, sequela: Secondary | ICD-10-CM

## 2013-01-25 DIAGNOSIS — IMO0002 Reserved for concepts with insufficient information to code with codable children: Secondary | ICD-10-CM

## 2013-01-25 DIAGNOSIS — N390 Urinary tract infection, site not specified: Secondary | ICD-10-CM

## 2013-01-25 DIAGNOSIS — R5381 Other malaise: Secondary | ICD-10-CM

## 2013-01-25 DIAGNOSIS — M549 Dorsalgia, unspecified: Secondary | ICD-10-CM

## 2013-01-25 DIAGNOSIS — I10 Essential (primary) hypertension: Secondary | ICD-10-CM

## 2013-01-25 NOTE — Telephone Encounter (Signed)
Informed daughter to check with facility, voiced understanding

## 2013-01-25 NOTE — Telephone Encounter (Signed)
Phone call from pt's daughter Rushie Nyhan. She would like to know when her mother is going to be discharged from [facility]? Please advise.

## 2013-01-25 NOTE — Progress Notes (Signed)
Patient ID: [name], female   DOB: 1927-10-23, 79 y.o.   MRN: [id number]  Chief Complaint: evaluation for discharge home with son  HPI:  79 year old African American female with a history of coronary artery disease, hypertension, dyslipidemia, GERD, and renal artery stenosis who was admitted to was his [facility] with symptoms of left-sided abdominal and flank pain for one week duration. She also had nausea and vomiting one day prior to hospitalization. She was found to have significant UTI on urinalysis and started on Rocephin and admitted to medical floor . However on the fourth day of her hospitalization she started developing worsening renal failure and Rocephin was discontinued. She had a significant acute kidney injury  to the point that she needed dialysis for 3 days. She did completed a total of 7 days of Rocephin. She was afebrile and improved white count prior to discharge. Her dialysis catheter was removed prior to discharge. Her creatinine had improved to 2 upon discharge. Due to prolonged hospitalization and weakness she was evaluated by physical therapy who recommended that she would need 2 weeks of skilled nursing for strength training prior to returning home. Patient currently doing well with therapy, now stable to discharge home with home health. Review of Systems:  Review of Systems  Constitutional: Positive for malaise/fatigue. Negative for fever and chills.  HENT: Negative.  Negative for neck pain.   Respiratory: Negative for cough, shortness of breath and wheezing.   Cardiovascular: Positive for leg swelling (some swelling in lower ext). Negative for chest pain and palpitations.  Gastrointestinal: Negative for nausea, vomiting, diarrhea and constipation.  Genitourinary: Negative for dysuria and frequency.  Musculoskeletal: Negative for myalgias and back pain.  Neurological: Positive for weakness (weakness has improved ). Negative for dizziness.  Psychiatric/Behavioral:  Negative for depression. The patient is not nervous/anxious and does not have insomnia.      Medications: Patient's Medications  New Prescriptions   No medications on file  Previous Medications   ALLOPURINOL (ZYLOPRIM) 100 MG TABLET    Take 2 tablets (200 mg total) by mouth daily.   AMLODIPINE (NORVASC) 5 MG TABLET    Take 5 mg by mouth daily.   ASPIRIN 81 MG TABLET    Take 81 mg by mouth daily.     ATROPINE 1 % OPHTHALMIC SOLUTION    Place 1 drop into the left eye 2 (two) times daily.   BIMATOPROST (LUMIGAN) 0.03 % OPHTHALMIC SOLUTION    Place 1 drop into the left eye daily.    CITALOPRAM (CELEXA) 20 MG TABLET    Take 20 mg by mouth daily.     COLCHICINE 0.6 MG TABLET    Take 1 tablet (0.6 mg total) by mouth as needed. Takes 2 tablets daily as needed for gout flareup.   DIAZEPAM (VALIUM) 5 MG TABLET    Take 1 tablet (5 mg total) by mouth every 6 (six) hours as needed for anxiety.   DORZOLAMIDE-TIMOLOL (COSOPT) 22.3-6.8 MG/ML OPHTHALMIC SOLUTION    Place 1 drop into both eyes 2 (two) times daily.   ERGOCALCIFEROL (VITAMIN D2) 50000 UNITS CAPSULE    Take 50,000 Units by mouth once a week.     FUROSEMIDE (LASIX) 80 MG TABLET    Take 80 mg by mouth daily.   GABAPENTIN (NEURONTIN) 600 MG TABLET    Take 1 tablet (600 mg total) by mouth 3 (three) times daily.   HYDRALAZINE (APRESOLINE) 100 MG TABLET    Take 100 mg by mouth 3 (three)  times daily.   INSULIN GLARGINE (LANTUS) 100 UNIT/ML INJECTION    Inject 6 Units into the skin at bedtime.   INSULIN LISPRO (HUMALOG) 100 UNIT/ML INJECTION    Inject 4 Units into the skin 3 (three) times daily before meals.   METOPROLOL TARTRATE (LOPRESSOR) 25 MG TABLET    Take 75 mg by mouth 2 (two) times daily.   NITROGLYCERIN (NITROSTAT) 0.4 MG SL TABLET    Place 0.4 mg under the tongue as needed. For chest pain.    PANTOPRAZOLE (PROTONIX) 40 MG TABLET    Take 40 mg by mouth daily.   POTASSIUM CHLORIDE (K-DUR) 10 MEQ TABLET    Take 2 tablets (20 mEq total) by  mouth daily.   PRAVASTATIN (PRAVACHOL) 80 MG TABLET    Take 80 mg by mouth at bedtime.     SODIUM CHLORIDE (MURO 128) 5 % OPHTHALMIC SOLUTION    Place 1 drop into the left eye 2 (two) times daily.  Modified Medications   No medications on file  Discontinued Medications   No medications on file     Physical Exam: Physical Exam  Constitutional: She appears well-developed and well-nourished. No distress.  HENT:  Head: Normocephalic.  Right Ear: External ear normal.  Eyes: EOM are normal. Pupils are equal, round, and reactive to light.  Neck: Normal range of motion.  Cardiovascular: Normal rate, regular rhythm, normal heart sounds and intact distal pulses.   Pulmonary/Chest: Effort normal and breath sounds normal.  Abdominal: Soft. Bowel sounds are normal.  Neurological: She is alert.  Skin: Skin is warm and dry. She is not diaphoretic.  Psychiatric: She has a normal mood and affect.     Filed Vitals:   01/25/13 1936  BP: 140/74  Pulse: 82  Temp: 97.5 F (36.4 C)  Resp: 18      Labs reviewed: Basic Metabolic Panel:  Recent Labs  16/10/96 0500 01/10/13 0655 01/11/13 0650 01/11/13 2345  NA 142 137 137 137  K 3.4* 3.3* 3.2* 3.0*  CL 104 99 99 99  CO2 26 27 27 29   GLUCOSE 112* 102* 103* 155*  BUN 50* 52* 48* 44*  CREATININE 2.85* 2.51* 2.21* 2.09*  CALCIUM 9.7 9.2 9.1 9.0  PHOS 3.6 2.8 2.4  --     Liver Function Tests:  Recent Labs  12/30/12 2054  01/09/13 0500 01/10/13 0655 01/11/13 0650  AST 28  --   --   --   --   ALT 16  --   --   --   --   ALKPHOS 123*  --   --   --   --   BILITOT 0.5  --   --   --   --   PROT 7.3  --   --   --   --   ALBUMIN 3.9  < > 2.8* 2.5* 2.6*  < > = values in this interval not displayed.  CBC:  Recent Labs  01/07/13 1300 01/08/13 1030 01/11/13 2345  WBC 8.6 8.7 11.8*  NEUTROABS 7.1  --  9.7*  HGB 9.5* 10.7* 10.1*  HCT 28.4* 31.3* 30.1*  MCV 80.9 80.7 80.7  PLT 144* 116* 97*    Significant Diagnostic  Results: 01/16/13: sodium 137, potassium 3.8, glucose 170, BUN 39, Cr 2.49  hgb A1c 6.6  Wbc 9.4, rbc 3.3, hgb 8.9, hct 27.4  Urine culture: no growth  Blood cultures: no growth   Assessment/Plan HYPERTENSION Patient is stable; continue current regimen.   RENAL  DISEASE, CHRONIC, STAGE II Stable will need to be followed up by PCP  UNSPECIFIED ANEMIA Patient is stable; continue current regimen. Will need to be followed up by PCP  BACK PAIN, CHRONIC Patient is stable; continue current regimen.   Wound of sacral region Cont to minimize pressure- will need HH nursing to come out to cont to monitor and help with dressing changes   IDDM To cont diet and weight loss modifications. Will cont current medications   Debility pt is stable for discharge-will need PT/OT/Nursing per home health. No DME needed. Rx written.  will need to follow up with PCP within 2-4 weeks.    UTI (lower urinary tract infection) Resolved

## 2013-01-25 NOTE — Telephone Encounter (Signed)
Discharge date is set by the facility team and the facilities attending md

## 2013-01-26 ENCOUNTER — Encounter: Payer: Self-pay | Admitting: Nurse Practitioner

## 2013-01-26 DIAGNOSIS — R5381 Other malaise: Secondary | ICD-10-CM | POA: Insufficient documentation

## 2013-01-26 DIAGNOSIS — N390 Urinary tract infection, site not specified: Secondary | ICD-10-CM | POA: Insufficient documentation

## 2013-01-26 DIAGNOSIS — S31000A Unspecified open wound of lower back and pelvis without penetration into retroperitoneum, initial encounter: Secondary | ICD-10-CM | POA: Insufficient documentation

## 2013-01-26 NOTE — Assessment & Plan Note (Signed)
Stable will need to be followed up by PCP

## 2013-01-26 NOTE — Assessment & Plan Note (Signed)
Patient is stable; continue current regimen. 

## 2013-01-26 NOTE — Assessment & Plan Note (Signed)
Cont to minimize pressure- will need HH nursing to come out to cont to monitor and help with dressing changes

## 2013-01-26 NOTE — Assessment & Plan Note (Signed)
Patient is stable; continue current regimen. Will need to be followed up by PCP

## 2013-01-26 NOTE — Assessment & Plan Note (Signed)
pt is stable for discharge-will need PT/OT/Nursing per home health. No DME needed. Rx written.  will need to follow up with PCP within 2-4 weeks.

## 2013-01-26 NOTE — Assessment & Plan Note (Signed)
Resolved

## 2013-01-26 NOTE — Assessment & Plan Note (Signed)
To cont diet and weight loss modifications. Will cont current medications

## 2013-01-27 ENCOUNTER — Other Ambulatory Visit: Payer: Self-pay | Admitting: Cardiovascular Disease

## 2013-01-27 ENCOUNTER — Other Ambulatory Visit: Payer: Self-pay | Admitting: Internal Medicine

## 2013-01-29 ENCOUNTER — Encounter: Payer: Self-pay | Admitting: Internal Medicine

## 2013-01-29 ENCOUNTER — Other Ambulatory Visit (INDEPENDENT_AMBULATORY_CARE_PROVIDER_SITE_OTHER): Payer: Medicare Other

## 2013-01-29 ENCOUNTER — Ambulatory Visit (INDEPENDENT_AMBULATORY_CARE_PROVIDER_SITE_OTHER): Payer: Medicare Other | Admitting: Internal Medicine

## 2013-01-29 VITALS — BP 130/62 | Temp 98.4°F | Wt 192.0 lb

## 2013-01-29 DIAGNOSIS — I10 Essential (primary) hypertension: Secondary | ICD-10-CM

## 2013-01-29 DIAGNOSIS — E1122 Type 2 diabetes mellitus with diabetic chronic kidney disease: Secondary | ICD-10-CM

## 2013-01-29 DIAGNOSIS — R5381 Other malaise: Secondary | ICD-10-CM

## 2013-01-29 DIAGNOSIS — N182 Chronic kidney disease, stage 2 (mild): Secondary | ICD-10-CM

## 2013-01-29 DIAGNOSIS — N390 Urinary tract infection, site not specified: Secondary | ICD-10-CM

## 2013-01-29 DIAGNOSIS — E1129 Type 2 diabetes mellitus with other diabetic kidney complication: Secondary | ICD-10-CM

## 2013-01-29 DIAGNOSIS — F411 Generalized anxiety disorder: Secondary | ICD-10-CM

## 2013-01-29 DIAGNOSIS — M109 Gout, unspecified: Secondary | ICD-10-CM

## 2013-01-29 LAB — URINALYSIS
Ketones, ur: NEGATIVE
Specific Gravity, Urine: 1.01 (ref 1.000–1.030)
Total Protein, Urine: NEGATIVE
Urine Glucose: NEGATIVE

## 2013-01-29 LAB — CBC WITH DIFFERENTIAL/PLATELET
Basophils Absolute: 0 10*3/uL (ref 0.0–0.1)
HCT: 35.7 % — ABNORMAL LOW (ref 36.0–46.0)
Lymphs Abs: 0.8 10*3/uL (ref 0.7–4.0)
Monocytes Relative: 6 % (ref 3.0–12.0)
Platelets: 257 10*3/uL (ref 150.0–400.0)
RDW: 23.1 % — ABNORMAL HIGH (ref 11.5–14.6)

## 2013-01-29 LAB — TSH: TSH: 3.37 u[IU]/mL (ref 0.35–5.50)

## 2013-01-29 LAB — HEPATIC FUNCTION PANEL
Bilirubin, Direct: 0.1 mg/dL (ref 0.0–0.3)
Total Bilirubin: 0.7 mg/dL (ref 0.3–1.2)

## 2013-01-29 LAB — BASIC METABOLIC PANEL
BUN: 52 mg/dL — ABNORMAL HIGH (ref 6–23)
Calcium: 9.6 mg/dL (ref 8.4–10.5)
GFR: 37.94 mL/min — ABNORMAL LOW (ref >60.00)
Glucose, Bld: 190 mg/dL — ABNORMAL HIGH (ref 70–99)
Potassium: 3.8 mEq/L (ref 3.5–5.1)

## 2013-01-29 MED ORDER — DIAZEPAM 5 MG PO TABS: 5.0000 mg | ORAL_TABLET | Freq: Two times a day (BID) | ORAL | Status: DC | PRN

## 2013-01-29 MED ORDER — VITAMIN D 1000 UNITS PO TABS: 1000.0000 [IU] | ORAL_TABLET | Freq: Every day | ORAL | Status: AC

## 2013-01-29 NOTE — Assessment & Plan Note (Signed)
Continue with current prescription therapy as reflected on the Med list.  

## 2013-01-29 NOTE — Assessment & Plan Note (Signed)
UA

## 2013-01-29 NOTE — Progress Notes (Signed)
  Subjective:    Patient ID: [name], female    DOB: [birth year], 79 y.o.   MRN: [id number]  HPI  She is back home from Rehab  S/p hosp stay last month -   ADMITTING DIAGNOSES:  1. Urinary tract infection.  2. Abdominal pain.  DISCHARGE DIAGNOSES:  1. Urinary tract infection, resolved.  2. Abdominal pain, resolved.  3. Acute renal failure, resolved.  CONSULTANTS: Dr. Cecille Aver, for Nephrology.  PROCEDURES: CT of the abdomen and pelvis, December 31, 2012, showing no  acute abnormalities involving the abdomen or pelvis, small hiatal  hernia, diffuse colonic diverticulosis, stable mild scarring of both  kidneys, stable pancreatic atrophy, atrophic uterus.  Ultrasound renal performed on December 31, 2012, with no evidence of  hydronephrosis involving either kidney, mild diffuse cortical thinning  is noted.  Portable chest x-ray, January 04, 2013, which showed a question of  congestive heart failure, superimposed multilobar pneumonia, or sequelae  of aspiration is not excluded.  Chest x-ray January 06, 2013, which showed cardiomegaly with mild  interstitial edema, improved, bilateral lower lobe opacities likely  atelectasis.  Renal procedures; hemodialysis x3.   Review of Systems  Constitutional: Negative for chills, activity change, appetite change, fatigue and unexpected weight change.  HENT: Negative for congestion, mouth sores and sinus pressure.   Eyes: Negative for visual disturbance.  Respiratory: Negative for cough and chest tightness.   Gastrointestinal: Negative for nausea and abdominal pain.  Genitourinary: Negative for frequency, difficulty urinating and vaginal pain.  Musculoskeletal: Positive for arthralgias and gait problem. Negative for back pain.  Skin: Negative for pallor and rash.  Neurological: Positive for weakness. Negative for dizziness, tremors, numbness and headaches.  Psychiatric/Behavioral: Positive for sleep disturbance. Negative for  confusion. The patient is nervous/anxious.        Objective:   Physical Exam  Constitutional: She appears well-developed. No distress.  HENT:  Head: Normocephalic.  Right Ear: External ear normal.  Left Ear: External ear normal.  Nose: Nose normal.  Mouth/Throat: Oropharynx is clear and moist.  Eyes: Conjunctivae are normal. Pupils are equal, round, and reactive to light. Right eye exhibits no discharge. Left eye exhibits no discharge.  Neck: Normal range of motion. Neck supple. No JVD present. No tracheal deviation present. No thyromegaly present.  Cardiovascular: Normal rate, regular rhythm and normal heart sounds.   Pulmonary/Chest: No stridor. No respiratory distress. She has no wheezes.  Abdominal: Soft. Bowel sounds are normal. She exhibits no distension and no mass. There is no tenderness. There is no rebound and no guarding.  Musculoskeletal: She exhibits no edema and no tenderness.  Lymphadenopathy:    She has no cervical adenopathy.  Neurological: She displays normal reflexes. No cranial nerve deficit. She exhibits normal muscle tone. Coordination abnormal.  walker  Skin: No rash noted. No erythema.  Psychiatric: She has a normal mood and affect. Her behavior is normal. Judgment and thought content normal.          Assessment & Plan:

## 2013-01-29 NOTE — Assessment & Plan Note (Signed)
diazepam prn

## 2013-01-29 NOTE — Assessment & Plan Note (Signed)
BMET 

## 2013-01-29 NOTE — Assessment & Plan Note (Signed)
Labs Using a walker

## 2013-02-01 ENCOUNTER — Encounter: Payer: Self-pay | Admitting: *Deleted

## 2013-02-01 ENCOUNTER — Telehealth: Payer: Self-pay | Admitting: *Deleted

## 2013-02-01 DIAGNOSIS — R262 Difficulty in walking, not elsewhere classified: Secondary | ICD-10-CM

## 2013-02-01 DIAGNOSIS — I10 Essential (primary) hypertension: Secondary | ICD-10-CM

## 2013-02-01 DIAGNOSIS — E119 Type 2 diabetes mellitus without complications: Secondary | ICD-10-CM

## 2013-02-01 DIAGNOSIS — Z794 Long term (current) use of insulin: Secondary | ICD-10-CM

## 2013-02-01 NOTE — Telephone Encounter (Signed)
HHRN called, she is starting care with pt today. She can be reached at number above with any questions.

## 2013-02-02 ENCOUNTER — Telehealth: Payer: Self-pay | Admitting: Internal Medicine

## 2013-02-02 MED ORDER — ONDANSETRON HCL 4 MG PO TABS: 4.0000 mg | ORAL_TABLET | Freq: Three times a day (TID) | ORAL | Status: DC | PRN

## 2013-02-02 NOTE — Telephone Encounter (Signed)
Chart reivewed  [facility] for zofran, but consider OV if persists, vomiting, blood, fever, or worsening abd pain or other symtpoms

## 2013-02-02 NOTE — Telephone Encounter (Signed)
Caller: Nancy/Other; Phone: [phone]; Reason for Call: Harriett Sine walker from [facility] calling to inquire about anti nausea medication for patient.  States that patient wakes up nauseated in the mornings.  Patient does not have anything on medlist for nausea.  Preferred Pharmacy is [facility] on [address].    OFFICE NOTE: PLEASE FOLLOW UP ON [place] MEDICATION REQUEST.

## 2013-02-02 NOTE — Telephone Encounter (Signed)
[facility]HRN informed 

## 2013-02-03 ENCOUNTER — Telehealth: Payer: Self-pay | Admitting: Internal Medicine

## 2013-02-03 NOTE — Telephone Encounter (Signed)
Noted, thx.

## 2013-02-03 NOTE — Telephone Encounter (Signed)
Donnie, OT with [facility] is calling to let Dr. Debby Bud know that he went out and did an assessment of the patient but she has declined further services because she is doing  Better with there rolling walker, Call Donnie if you have further questions [phone]

## 2013-02-17 DIAGNOSIS — I10 Essential (primary) hypertension: Secondary | ICD-10-CM

## 2013-02-17 DIAGNOSIS — E119 Type 2 diabetes mellitus without complications: Secondary | ICD-10-CM

## 2013-02-17 DIAGNOSIS — R262 Difficulty in walking, not elsewhere classified: Secondary | ICD-10-CM

## 2013-02-17 DIAGNOSIS — Z794 Long term (current) use of insulin: Secondary | ICD-10-CM

## 2013-02-24 ENCOUNTER — Emergency Department (HOSPITAL_COMMUNITY): Payer: Medicare Other

## 2013-02-24 ENCOUNTER — Inpatient Hospital Stay (HOSPITAL_COMMUNITY)
Admission: EM | Admit: 2013-02-24 | Discharge: 2013-03-05 | DRG: 981 | Disposition: A | Discharge status: Skilled Nursing Facility | Payer: Medicare Other | Attending: Internal Medicine | Admitting: Internal Medicine

## 2013-02-24 ENCOUNTER — Encounter (HOSPITAL_COMMUNITY): Payer: Self-pay | Admitting: *Deleted

## 2013-02-24 DIAGNOSIS — G44209 Tension-type headache, unspecified, not intractable: Secondary | ICD-10-CM

## 2013-02-24 DIAGNOSIS — K219 Gastro-esophageal reflux disease without esophagitis: Secondary | ICD-10-CM

## 2013-02-24 DIAGNOSIS — N179 Acute kidney failure, unspecified: Secondary | ICD-10-CM

## 2013-02-24 DIAGNOSIS — K612 Anorectal abscess: Secondary | ICD-10-CM | POA: Diagnosis present

## 2013-02-24 DIAGNOSIS — L8990 Pressure ulcer of unspecified site, unspecified stage: Secondary | ICD-10-CM | POA: Clinically undetermined

## 2013-02-24 DIAGNOSIS — E872 Acidosis, unspecified: Secondary | ICD-10-CM

## 2013-02-24 DIAGNOSIS — G9341 Metabolic encephalopathy: Secondary | ICD-10-CM | POA: Diagnosis present

## 2013-02-24 DIAGNOSIS — R109 Unspecified abdominal pain: Secondary | ICD-10-CM

## 2013-02-24 DIAGNOSIS — R5381 Other malaise: Secondary | ICD-10-CM

## 2013-02-24 DIAGNOSIS — F3289 Other specified depressive episodes: Secondary | ICD-10-CM

## 2013-02-24 DIAGNOSIS — K610 Anal abscess: Secondary | ICD-10-CM

## 2013-02-24 DIAGNOSIS — R0602 Shortness of breath: Secondary | ICD-10-CM

## 2013-02-24 DIAGNOSIS — F05 Delirium due to known physiological condition: Secondary | ICD-10-CM | POA: Diagnosis not present

## 2013-02-24 DIAGNOSIS — R4182 Altered mental status, unspecified: Secondary | ICD-10-CM

## 2013-02-24 DIAGNOSIS — F411 Generalized anxiety disorder: Secondary | ICD-10-CM

## 2013-02-24 DIAGNOSIS — E109 Type 1 diabetes mellitus without complications: Secondary | ICD-10-CM

## 2013-02-24 DIAGNOSIS — D649 Anemia, unspecified: Secondary | ICD-10-CM

## 2013-02-24 DIAGNOSIS — N184 Chronic kidney disease, stage 4 (severe): Secondary | ICD-10-CM | POA: Diagnosis present

## 2013-02-24 DIAGNOSIS — Z66 Do not resuscitate: Secondary | ICD-10-CM | POA: Diagnosis present

## 2013-02-24 DIAGNOSIS — E876 Hypokalemia: Secondary | ICD-10-CM

## 2013-02-24 DIAGNOSIS — Z794 Long term (current) use of insulin: Secondary | ICD-10-CM

## 2013-02-24 DIAGNOSIS — H409 Unspecified glaucoma: Secondary | ICD-10-CM

## 2013-02-24 DIAGNOSIS — L8994 Pressure ulcer of unspecified site, stage 4: Secondary | ICD-10-CM | POA: Diagnosis present

## 2013-02-24 DIAGNOSIS — F329 Major depressive disorder, single episode, unspecified: Secondary | ICD-10-CM

## 2013-02-24 DIAGNOSIS — I10 Essential (primary) hypertension: Secondary | ICD-10-CM

## 2013-02-24 DIAGNOSIS — N182 Chronic kidney disease, stage 2 (mild): Secondary | ICD-10-CM

## 2013-02-24 DIAGNOSIS — E119 Type 2 diabetes mellitus without complications: Secondary | ICD-10-CM | POA: Diagnosis present

## 2013-02-24 DIAGNOSIS — F0390 Unspecified dementia without behavioral disturbance: Secondary | ICD-10-CM | POA: Diagnosis present

## 2013-02-24 DIAGNOSIS — E785 Hyperlipidemia, unspecified: Secondary | ICD-10-CM

## 2013-02-24 DIAGNOSIS — S31000S Unspecified open wound of lower back and pelvis without penetration into retroperitoneum, sequela: Secondary | ICD-10-CM

## 2013-02-24 DIAGNOSIS — K859 Acute pancreatitis without necrosis or infection, unspecified: Principal | ICD-10-CM

## 2013-02-24 DIAGNOSIS — I2510 Atherosclerotic heart disease of native coronary artery without angina pectoris: Secondary | ICD-10-CM

## 2013-02-24 DIAGNOSIS — E860 Dehydration: Secondary | ICD-10-CM | POA: Diagnosis present

## 2013-02-24 DIAGNOSIS — M109 Gout, unspecified: Secondary | ICD-10-CM

## 2013-02-24 DIAGNOSIS — L89309 Pressure ulcer of unspecified buttock, unspecified stage: Secondary | ICD-10-CM | POA: Diagnosis present

## 2013-02-24 DIAGNOSIS — Z6830 Body mass index (BMI) 30.0-30.9, adult: Secondary | ICD-10-CM

## 2013-02-24 DIAGNOSIS — N390 Urinary tract infection, site not specified: Secondary | ICD-10-CM

## 2013-02-24 DIAGNOSIS — Z9089 Acquired absence of other organs: Secondary | ICD-10-CM

## 2013-02-24 DIAGNOSIS — I701 Atherosclerosis of renal artery: Secondary | ICD-10-CM

## 2013-02-24 DIAGNOSIS — I129 Hypertensive chronic kidney disease with stage 1 through stage 4 chronic kidney disease, or unspecified chronic kidney disease: Secondary | ICD-10-CM | POA: Diagnosis present

## 2013-02-24 DIAGNOSIS — M549 Dorsalgia, unspecified: Secondary | ICD-10-CM

## 2013-02-24 LAB — COMPREHENSIVE METABOLIC PANEL
AST: 17 U/L (ref 0–37)
BUN: 68 mg/dL — ABNORMAL HIGH (ref 6–23)
CO2: 14 mEq/L — ABNORMAL LOW (ref 19–32)
Chloride: 101 mEq/L (ref 96–112)
Creatinine, Ser: 2.89 mg/dL — ABNORMAL HIGH (ref 0.50–1.10)
GFR calc Af Amer: 16 mL/min — ABNORMAL LOW (ref >90)
GFR calc non Af Amer: 14 mL/min — ABNORMAL LOW (ref >90)
Glucose, Bld: 274 mg/dL — ABNORMAL HIGH (ref 70–99)
Total Bilirubin: 0.4 mg/dL (ref 0.3–1.2)

## 2013-02-24 LAB — URINALYSIS, ROUTINE W REFLEX MICROSCOPIC
Leukocytes, UA: NEGATIVE
Nitrite: NEGATIVE
Specific Gravity, Urine: 1.019 (ref 1.005–1.030)
Urobilinogen, UA: 0.2 mg/dL (ref 0.0–1.0)
pH: 5 (ref 5.0–8.0)

## 2013-02-24 LAB — CBC WITH DIFFERENTIAL/PLATELET
Basophils Relative: 0 % (ref 0–1)
Eosinophils Relative: 0 % (ref 0–5)
HCT: 34.9 % — ABNORMAL LOW (ref 36.0–46.0)
Lymphs Abs: 0.4 10*3/uL — ABNORMAL LOW (ref 0.7–4.0)
MCV: 82.1 fL (ref 78.0–100.0)
Monocytes Relative: 6 % (ref 3–12)
Neutro Abs: 8.2 10*3/uL — ABNORMAL HIGH (ref 1.7–7.7)
RBC: 4.25 MIL/uL (ref 3.87–5.11)
WBC: 9.2 10*3/uL (ref 4.0–10.5)

## 2013-02-24 LAB — TROPONIN I: Troponin I: <0.3 ng/mL (ref <0.30)

## 2013-02-24 LAB — LIPASE, BLOOD: Lipase: 1370 U/L — ABNORMAL HIGH (ref 11–59)

## 2013-02-24 MED ORDER — POTASSIUM CHLORIDE 10 MEQ/100ML IV SOLN
10.0000 meq | Freq: Once | INTRAVENOUS | Status: AC
  Administered 2013-02-25: 10 meq via INTRAVENOUS
  Filled 2013-02-24 (×2): qty 100

## 2013-02-24 MED ORDER — IOHEXOL 300 MG/ML  SOLN: 50.0000 mL | Freq: Once | INTRAMUSCULAR | Status: AC | PRN

## 2013-02-24 MED ORDER — POTASSIUM CHLORIDE 10 MEQ/100ML IV SOLN
10.0000 meq | Freq: Once | INTRAVENOUS | Status: AC
  Administered 2013-02-24: 10 meq via INTRAVENOUS

## 2013-02-24 MED ORDER — ONDANSETRON HCL 4 MG/2ML IJ SOLN
4.0000 mg | Freq: Once | INTRAMUSCULAR | Status: AC
  Administered 2013-02-24: 4 mg via INTRAVENOUS
  Filled 2013-02-24: qty 2

## 2013-02-24 NOTE — ED Notes (Signed)
Critical potassium of 2.7 reported to [facility]

## 2013-02-24 NOTE — ED Notes (Signed)
Per EMS report: Pt from home: pt c/o of diarrhea and when pt stood up from the chair, pt saw some blood in her stool. EMS reports pt's stool is watery, foul smelling, x 2-3 days.  Pt's family member reports decreased appetite. Pt denies fever, chills, loss of consciousness, nausea, or vomiting. Pt can be confused but pt is currently a/o x 4.  Skin warm and dry. IV: 18g LAC. Pt denies pain but when pt was moved to EMS stretcher, pt reports generalized pain.  EMS 12 lead was unremarkable.  Pt discharged from [facility] about a month ago for renal issues. EMS VS: BP: 120/60, HR: 70, RR: 16,  CBG: 260

## 2013-02-24 NOTE — ED Provider Notes (Signed)
  Medical screening examination/treatment/procedure(s) were performed by non-physician practitioner and as supervising physician I was immediately available for consultation/collaboration.  On my exam the patient was in no distress.  However, given the description of new nausea, vomiting, anorexia, abdominal pain, labs, CT performed.  The patient has recurrent renal failure, pancreatitis.  She required admission for further evaluation and management.  I saw the ECG (if appropriate), relevant labs and studies - I agree with the interpretation.    Gerhard Munch, MD 02/24/13 [phone]

## 2013-02-24 NOTE — ED Notes (Signed)
Phlebotomy attempted to draw blood with no success.  This RN attempted to draw back on IV line and was unable to draw blood.

## 2013-02-24 NOTE — ED Notes (Signed)
[id number]<AV> Expected date:<BR> Expected time:<BR> Means of arrival:<BR> Comments:<BR> EMS, diarrhea

## 2013-02-24 NOTE — ED Provider Notes (Signed)
History     CSN: [id number]  Arrival date & time 02/24/13  2046   First MD Initiated Contact with Patient 02/24/13 2107      No chief complaint on file.   (Consider location/radiation/quality/duration/timing/severity/associated sxs/prior treatment) The history is provided by the patient and medical records. No language interpreter was used.    [name] is a 79 y.o. female  with a hx of ARF, CAD, anemia, IDDM, CHF presents to the Emergency Department complaining of gradual, persistent, progressively worsening generalized weakness onset almost 4 weeks ago after being released from [facility] on 01/27/13 to live at home with her son.  Pt has had a a slow mental and physical decline since coming home with decreasing appetite, decreasing mobility, increasing weakness and confusion.  Pt's son states that she has had a marked decline in the past several days with associated diarrhea, continually increasing weakness and vomiting beginning this morning.  Associated symptoms include anorexia, headache, chills.  Nothing seems to make it better or worse.  Pt family denies fever, neck and back pain, dysuria.  Pt is a level 5 caveat 2/2 altered mental status.     Past Medical History  Diagnosis Date  . Coronary artery disease   . Renal artery stenosis   . Hypertension   . Dyslipidemia   . GERD (gastroesophageal reflux disease)   . Tension headache   . Abdominal pain   . Anemia, unspecified   . Gout   . Back pain, chronic   . History of tonsillectomy   . History of cholecystectomy   . Depression   . IDDM (insulin dependent diabetes mellitus)   . Diabetes mellitus   . CHF (congestive heart failure)     Past Surgical History  Procedure Laterality Date  . Tonsillectomy    . Cholecystectomy      Family History  Problem Relation Age of Onset  . Coronary artery disease Father   . Hypertension Father   . Heart attack Father   . Hyperlipidemia Father   . Diabetes Sister   .  Hypertension Sister   . Hyperlipidemia Sister   . Hypertension Brother   . Diabetes Brother   . Hyperlipidemia Brother   . Cancer Neg Hx     Breast and colon    History  Substance Use Topics  . Smoking status: Never Smoker   . Smokeless tobacco: Not on file  . Alcohol Use: No    OB History   Grav Para Term Preterm Abortions TAB SAB Ect Mult Living                  Review of Systems  Unable to perform ROS: Mental status change    Allergies  Review of patient's allergies indicates no known allergies.  Home Medications   Current Outpatient Rx  Name  Route  Sig  Dispense  Refill  . allopurinol (ZYLOPRIM) 100 MG tablet   Oral   Take 2 tablets (200 mg total) by mouth daily.         Marland Kitchen amLODipine (NORVASC) 5 MG tablet   Oral   Take 5 mg by mouth daily.         Marland Kitchen aspirin 81 MG tablet   Oral   Take 81 mg by mouth daily.           Marland Kitchen atropine 1 % ophthalmic solution   Left Eye   Place 1 drop into the left eye 2 (two) times daily.         Marland Kitchen  bimatoprost (LUMIGAN) 0.03 % ophthalmic solution   Left Eye   Place 1 drop into the left eye daily.          . cholecalciferol (VITAMIN D) 1000 UNITS tablet   Oral   Take 1 tablet (1,000 Units total) by mouth daily.   100 tablet   3   . citalopram (CELEXA) 20 MG tablet   Oral   Take 20 mg by mouth daily.           . colchicine 0.6 MG tablet   Oral   Take 1 tablet (0.6 mg total) by mouth as needed. Takes 2 tablets daily as needed for gout flareup.         . diazepam (VALIUM) 5 MG tablet   Oral   Take 1 tablet (5 mg total) by mouth every 12 (twelve) hours as needed for anxiety or sleep.   60 tablet   3   . dorzolamide-timolol (COSOPT) 22.3-6.8 MG/ML ophthalmic solution   Both Eyes   Place 1 drop into both eyes 2 (two) times daily.         . ergocalciferol (VITAMIN D2) 50000 UNITS capsule   Oral   Take 50,000 Units by mouth once a week.           . furosemide (LASIX) 80 MG tablet   Oral   Take 80  mg by mouth daily.         Marland Kitchen gabapentin (NEURONTIN) 600 MG tablet   Oral   Take 1 tablet (600 mg total) by mouth 3 (three) times daily.   90 tablet   2   . hydrALAZINE (APRESOLINE) 100 MG tablet   Oral   Take 100 mg by mouth 3 (three) times daily.         . insulin glargine (LANTUS) 100 UNIT/ML injection   Subcutaneous   Inject 6 Units into the skin at bedtime.         . insulin lispro (HUMALOG) 100 UNIT/ML injection   Subcutaneous   Inject 4 Units into the skin 3 (three) times daily before meals.         . metoprolol tartrate (LOPRESSOR) 25 MG tablet   Oral   Take 75 mg by mouth 2 (two) times daily.         . nitroGLYCERIN (NITROSTAT) 0.4 MG SL tablet   Sublingual   Place 0.4 mg under the tongue as needed. For chest pain.          Marland Kitchen ondansetron (ZOFRAN) 4 MG tablet   Oral   Take 1 tablet (4 mg total) by mouth every 8 (eight) hours as needed for nausea.   30 tablet   0   . pantoprazole (PROTONIX) 40 MG tablet   Oral   Take 40 mg by mouth every morning.         . potassium chloride (K-DUR) 10 MEQ tablet   Oral   Take 2 tablets (20 mEq total) by mouth daily.   30 tablet      . pravastatin (PRAVACHOL) 80 MG tablet   Oral   Take 80 mg by mouth at bedtime.           . sodium chloride (MURO 128) 5 % ophthalmic solution   Left Eye   Place 1 drop into the left eye 2 (two) times daily.           BP 147/59  Pulse 73  Temp(Src) 98.7 F (37.1 C) (Oral)  Resp 18  SpO2 97%  Physical Exam  Nursing note and vitals reviewed. Constitutional: She appears well-developed and well-nourished.  HENT:  Head: Normocephalic and atraumatic.  Nose: Nose normal.  Mouth/Throat: Oropharynx is clear and moist.  Eyes: Conjunctivae and EOM are normal. Pupils are equal, round, and reactive to light. No scleral icterus.  Neck: Normal range of motion.  Cardiovascular: Normal rate, regular rhythm, normal heart sounds and intact distal pulses.   Pulmonary/Chest:  Effort normal and breath sounds normal.  Abdominal: Soft. Bowel sounds are normal. She exhibits no distension and no mass. There is tenderness in the epigastric area. There is guarding. There is no rigidity, no rebound and no CVA tenderness.  Abdomen soft, tender to palpation, guarding  Lymphadenopathy:    She has no cervical adenopathy.  Neurological: She is alert. She exhibits normal muscle tone. Coordination normal.  Patient alert, unable to orient  Skin: Skin is warm and dry. No rash noted. No erythema.  Psychiatric: She has a normal mood and affect.    ED Course  Procedures (including critical care time)  Labs Reviewed  CBC WITH DIFFERENTIAL - Abnormal; Notable for the following:    Hemoglobin 11.6 (*)    HCT 34.9 (*)    RDW 19.8 (*)    Neutrophils Relative % 90 (*)    Lymphocytes Relative 4 (*)    Neutro Abs 8.2 (*)    Lymphs Abs 0.4 (*)    All other components within normal limits  COMPREHENSIVE METABOLIC PANEL - Abnormal; Notable for the following:    Potassium 2.7 (*)    CO2 14 (*)    Glucose, Bld 274 (*)    BUN 68 (*)    Creatinine, Ser 2.89 (*)    Alkaline Phosphatase 149 (*)    GFR calc non Af Amer 14 (*)    GFR calc Af Amer 16 (*)    All other components within normal limits  LIPASE, BLOOD - Abnormal; Notable for the following:    Lipase 1370 (*)    All other components within normal limits  URINALYSIS, ROUTINE W REFLEX MICROSCOPIC - Abnormal; Notable for the following:    Bilirubin Urine SMALL (*)    All other components within normal limits  CLOSTRIDIUM DIFFICILE BY PCR  TROPONIN I   Ct Abdomen Pelvis Wo Contrast  02/24/2013   *RADIOLOGY REPORT*  Clinical Data: Abdominal pain  CT ABDOMEN AND PELVIS WITHOUT CONTRAST  Technique:  Multidetector CT imaging of the abdomen and pelvis was performed following the standard protocol without intravenous contrast.  Comparison: 12/30/2012  Findings: Coronary artery calcification.  Tiny hiatal hernia.  Organ  abnormality/lesion detection is limited in the absence of intravenous contrast. Within this limitation, unremarkable liver, spleen, pancreas, adrenal glands.  Absent gallbladder.  No biliary ductal dilatation.  Lobular renal contours.  No hydronephrosis or hydroureter.  Colonic diverticulosis.  No overt diverticulitis.  Small bowel loops are normal course and caliber.  No free intraperitoneal air or fluid.  No lymphadenopathy.  There is scattered atherosclerotic calcification of the aorta and its branches. No aneurysmal dilatation.  Decompressed bladder.  Fibroid uterus.  No adnexal mass.  Stranding of the subcutaneous fat right posterior to the coccyx. On the left more inferiorly, stranding of the subcutaneous fat along the left gluteal cleft.  Diffuse osteopenia and multilevel degenerative changes.  No acute osseous finding. CT is limited sensitivity for osteomyelitis detection.  IMPRESSION: Colonic diverticulosis without overt diverticulitis.  Bilateral subcutaneous fat stranding posteriorly may reflect decubitus ulcers.  Recommend  direct inspection.   Original Report Authenticated By: Jearld Lesch, M.D.   Dg Chest 2 View  02/24/2013   *RADIOLOGY REPORT*  Clinical Data: Altered mental status, shortness of breath.  CHEST - 2 VIEW  Comparison: 01/06/2013  Findings: Cardiomegaly with vascular congestion.  No overt edema. No focal airspace opacities or effusions.  No acute bony abnormality.  IMPRESSION: Cardiomegaly, vascular congestion.   Original Report Authenticated By: Charlett Nose, M.D.    ECG:  Date: 02/24/2013  Rate: 76  Rhythm: sinus arrhythmia  QRS Axis: left  Intervals: normal  ST/T Wave abnormalities: nonspecific ST changes  Conduction Disutrbances:none  Narrative Interpretation: poor tracing, sinus arrythmia, no ST elevation  Old EKG Reviewed: unchanged    1. Pancreatitis   2. ARF (acute renal failure)   3. Altered mental status       MDM  TAMYIA MINICH presents with  decreasing mental status for one month and nausea vomiting and anorexia for several days.  Concern for C. difficile versus other abdominal etiology. Patient has also been complaining of abdominal pain. Will obtain labs and CT scan.  CBC with mild anemia no leukocytosis.  CMP with hypokalemia increasing acute renal failure elevated BUN and creatinine. Lipase significantly elevated at 1370.  Chest x-ray without focal opacity the noted vascular congestion.  Patient with increasing abdominal tenderness on serial exams. CT scan abdomen pending.  Proceed with admission as patient will need further followup on her acute renal failure and pancreatitis.  Dr. Gerhard Munch was consulted, evaluated this patient with me and agrees with the plan.             Dahlia Client Wilba Mutz, PA-C 02/24/13 2350

## 2013-02-25 ENCOUNTER — Encounter (HOSPITAL_COMMUNITY): Payer: Self-pay | Admitting: Internal Medicine

## 2013-02-25 DIAGNOSIS — E872 Acidosis: Secondary | ICD-10-CM

## 2013-02-25 DIAGNOSIS — R109 Unspecified abdominal pain: Secondary | ICD-10-CM

## 2013-02-25 DIAGNOSIS — E876 Hypokalemia: Secondary | ICD-10-CM

## 2013-02-25 DIAGNOSIS — I10 Essential (primary) hypertension: Secondary | ICD-10-CM

## 2013-02-25 DIAGNOSIS — M109 Gout, unspecified: Secondary | ICD-10-CM

## 2013-02-25 DIAGNOSIS — N182 Chronic kidney disease, stage 2 (mild): Secondary | ICD-10-CM

## 2013-02-25 DIAGNOSIS — F329 Major depressive disorder, single episode, unspecified: Secondary | ICD-10-CM

## 2013-02-25 DIAGNOSIS — R4182 Altered mental status, unspecified: Secondary | ICD-10-CM

## 2013-02-25 DIAGNOSIS — N189 Chronic kidney disease, unspecified: Secondary | ICD-10-CM

## 2013-02-25 DIAGNOSIS — K859 Acute pancreatitis without necrosis or infection, unspecified: Principal | ICD-10-CM | POA: Diagnosis present

## 2013-02-25 DIAGNOSIS — K219 Gastro-esophageal reflux disease without esophagitis: Secondary | ICD-10-CM

## 2013-02-25 DIAGNOSIS — E109 Type 1 diabetes mellitus without complications: Secondary | ICD-10-CM

## 2013-02-25 DIAGNOSIS — N179 Acute kidney failure, unspecified: Secondary | ICD-10-CM

## 2013-02-25 DIAGNOSIS — N184 Chronic kidney disease, stage 4 (severe): Secondary | ICD-10-CM | POA: Diagnosis present

## 2013-02-25 LAB — GLUCOSE, CAPILLARY
Glucose-Capillary: 204 mg/dL — ABNORMAL HIGH (ref 70–99)
Glucose-Capillary: 114 mg/dL — ABNORMAL HIGH (ref 70–99)
Glucose-Capillary: 138 mg/dL — ABNORMAL HIGH (ref 70–99)

## 2013-02-25 LAB — CBC
HCT: 33.3 % — ABNORMAL LOW (ref 36.0–46.0)
RDW: 19.7 % — ABNORMAL HIGH (ref 11.5–15.5)
WBC: 12.6 10*3/uL — ABNORMAL HIGH (ref 4.0–10.5)

## 2013-02-25 LAB — COMPREHENSIVE METABOLIC PANEL
AST: 21 U/L (ref 0–37)
Albumin: 3.3 g/dL — ABNORMAL LOW (ref 3.5–5.2)
Alkaline Phosphatase: 135 U/L — ABNORMAL HIGH (ref 39–117)
BUN: 65 mg/dL — ABNORMAL HIGH (ref 6–23)
Chloride: 106 mEq/L (ref 96–112)
Potassium: 2.9 mEq/L — ABNORMAL LOW (ref 3.5–5.1)
Total Bilirubin: 0.4 mg/dL (ref 0.3–1.2)

## 2013-02-25 LAB — LIPID PANEL
Cholesterol: 104 mg/dL (ref 0–200)
LDL Cholesterol: 51 mg/dL (ref 0–99)
VLDL: 28 mg/dL (ref 0–40)

## 2013-02-25 MED ORDER — CITALOPRAM HYDROBROMIDE 20 MG PO TABS
20.0000 mg | ORAL_TABLET | Freq: Every day | ORAL | Status: DC
  Administered 2013-02-26 – 2013-03-05 (×7): 20 mg via ORAL
  Filled 2013-02-25 (×9): qty 1

## 2013-02-25 MED ORDER — HYDRALAZINE HCL 50 MG PO TABS
100.0000 mg | ORAL_TABLET | Freq: Three times a day (TID) | ORAL | Status: DC
  Administered 2013-02-25 – 2013-03-05 (×21): 100 mg via ORAL
  Filled 2013-02-25 (×27): qty 2

## 2013-02-25 MED ORDER — HYDROMORPHONE HCL PF 1 MG/ML IJ SOLN
0.5000 mg | INTRAMUSCULAR | Status: DC | PRN
  Administered 2013-03-04 – 2013-03-05 (×5): 0.5 mg via INTRAVENOUS
  Filled 2013-02-25 (×4): qty 1

## 2013-02-25 MED ORDER — INSULIN ASPART 100 UNIT/ML ~~LOC~~ SOLN
0.0000 [IU] | SUBCUTANEOUS | Status: DC
  Administered 2013-02-25: 2 [IU] via SUBCUTANEOUS
  Administered 2013-02-25: 5 [IU] via SUBCUTANEOUS
  Administered 2013-02-25 (×2): 3 [IU] via SUBCUTANEOUS
  Administered 2013-02-25 – 2013-02-26 (×3): 2 [IU] via SUBCUTANEOUS
  Administered 2013-02-26: 3 [IU] via SUBCUTANEOUS
  Administered 2013-02-26 – 2013-02-27 (×6): 2 [IU] via SUBCUTANEOUS
  Administered 2013-02-27: 5 [IU] via SUBCUTANEOUS
  Administered 2013-02-27 – 2013-02-28 (×2): 3 [IU] via SUBCUTANEOUS
  Administered 2013-02-28: 2 [IU] via SUBCUTANEOUS
  Administered 2013-02-28 (×2): 3 [IU] via SUBCUTANEOUS
  Administered 2013-02-28: 5 [IU] via SUBCUTANEOUS
  Administered 2013-02-28: 11 [IU] via SUBCUTANEOUS
  Administered 2013-03-01: 3 [IU] via SUBCUTANEOUS
  Administered 2013-03-01: 2 [IU] via SUBCUTANEOUS
  Administered 2013-03-01 (×3): 3 [IU] via SUBCUTANEOUS
  Administered 2013-03-01: 8 [IU] via SUBCUTANEOUS
  Administered 2013-03-02: 2 [IU] via SUBCUTANEOUS
  Administered 2013-03-02 (×2): 3 [IU] via SUBCUTANEOUS
  Administered 2013-03-02: 5 [IU] via SUBCUTANEOUS
  Administered 2013-03-02: 3 [IU] via SUBCUTANEOUS
  Administered 2013-03-02: 2 [IU] via SUBCUTANEOUS
  Administered 2013-03-03 (×2): 3 [IU] via SUBCUTANEOUS
  Administered 2013-03-03: 2 [IU] via SUBCUTANEOUS
  Administered 2013-03-03 – 2013-03-04 (×4): 3 [IU] via SUBCUTANEOUS
  Administered 2013-03-04: 2 [IU] via SUBCUTANEOUS
  Administered 2013-03-04: 5 [IU] via SUBCUTANEOUS
  Administered 2013-03-04: 3 [IU] via SUBCUTANEOUS
  Administered 2013-03-04: 11 [IU] via SUBCUTANEOUS
  Administered 2013-03-04: 3 [IU] via SUBCUTANEOUS
  Administered 2013-03-05: 5 [IU] via SUBCUTANEOUS
  Administered 2013-03-05: 3 [IU] via SUBCUTANEOUS
  Filled 2013-02-25: qty 1

## 2013-02-25 MED ORDER — HEPARIN SODIUM (PORCINE) 5000 UNIT/ML IJ SOLN
5000.0000 [IU] | Freq: Three times a day (TID) | INTRAMUSCULAR | Status: AC
  Administered 2013-02-25 – 2013-03-03 (×22): 5000 [IU] via SUBCUTANEOUS
  Filled 2013-02-25 (×23): qty 1

## 2013-02-25 MED ORDER — METOPROLOL TARTRATE 50 MG PO TABS
75.0000 mg | ORAL_TABLET | Freq: Two times a day (BID) | ORAL | Status: DC
  Administered 2013-02-25 – 2013-03-05 (×14): 75 mg via ORAL
  Filled 2013-02-25 (×17): qty 1
  Filled 2013-02-25: qty 3
  Filled 2013-02-25 (×3): qty 1

## 2013-02-25 MED ORDER — SODIUM CHLORIDE 0.9 % IV SOLN
INTRAVENOUS | Status: DC
  Administered 2013-02-25 – 2013-02-27 (×4): via INTRAVENOUS

## 2013-02-25 MED ORDER — GABAPENTIN 300 MG PO CAPS
600.0000 mg | ORAL_CAPSULE | Freq: Three times a day (TID) | ORAL | Status: DC
  Administered 2013-02-25 – 2013-03-04 (×19): 600 mg via ORAL
  Filled 2013-02-25 (×24): qty 2

## 2013-02-25 MED ORDER — PANTOPRAZOLE SODIUM 40 MG PO TBEC
40.0000 mg | DELAYED_RELEASE_TABLET | Freq: Every morning | ORAL | Status: DC
  Administered 2013-02-26 – 2013-02-28 (×3): 40 mg via ORAL
  Filled 2013-02-25 (×5): qty 1

## 2013-02-25 MED ORDER — BIMATOPROST 0.01 % OP SOLN
1.0000 [drp] | Freq: Every day | OPHTHALMIC | Status: DC
  Administered 2013-02-25 – 2013-03-05 (×9): 1 [drp] via OPHTHALMIC
  Filled 2013-02-25: qty 2.5

## 2013-02-25 MED ORDER — SODIUM CHLORIDE (HYPERTONIC) 5 % OP SOLN
1.0000 [drp] | Freq: Two times a day (BID) | OPHTHALMIC | Status: DC
  Administered 2013-02-25 – 2013-03-05 (×18): 1 [drp] via OPHTHALMIC
  Filled 2013-02-25: qty 15

## 2013-02-25 MED ORDER — BIMATOPROST 0.03 % OP SOLN
1.0000 [drp] | Freq: Every day | OPHTHALMIC | Status: DC
  Filled 2013-02-25: qty 2.5

## 2013-02-25 MED ORDER — SIMVASTATIN 40 MG PO TABS
40.0000 mg | ORAL_TABLET | Freq: Every day | ORAL | Status: DC
  Filled 2013-02-25: qty 1

## 2013-02-25 MED ORDER — POTASSIUM CHLORIDE CRYS ER 20 MEQ PO TBCR
40.0000 meq | EXTENDED_RELEASE_TABLET | Freq: Once | ORAL | Status: AC
  Administered 2013-02-25: 40 meq via ORAL
  Filled 2013-02-25: qty 2

## 2013-02-25 MED ORDER — ATROPINE SULFATE 1 % OP SOLN
1.0000 [drp] | Freq: Two times a day (BID) | OPHTHALMIC | Status: DC
  Administered 2013-02-25 – 2013-03-05 (×18): 1 [drp] via OPHTHALMIC
  Filled 2013-02-25: qty 2

## 2013-02-25 MED ORDER — DORZOLAMIDE HCL-TIMOLOL MAL 2-0.5 % OP SOLN
1.0000 [drp] | Freq: Two times a day (BID) | OPHTHALMIC | Status: DC
  Administered 2013-02-25 – 2013-03-05 (×18): 1 [drp] via OPHTHALMIC
  Filled 2013-02-25: qty 10

## 2013-02-25 MED ORDER — ATORVASTATIN CALCIUM 20 MG PO TABS
20.0000 mg | ORAL_TABLET | Freq: Every day | ORAL | Status: DC
  Administered 2013-02-25: 20 mg via ORAL
  Filled 2013-02-25 (×2): qty 1

## 2013-02-25 MED ORDER — ASPIRIN EC 81 MG PO TBEC
81.0000 mg | DELAYED_RELEASE_TABLET | Freq: Every day | ORAL | Status: DC
  Administered 2013-02-26 – 2013-03-05 (×7): 81 mg via ORAL
  Filled 2013-02-25 (×9): qty 1

## 2013-02-25 MED ORDER — AMLODIPINE BESYLATE 5 MG PO TABS
5.0000 mg | ORAL_TABLET | Freq: Every day | ORAL | Status: DC
  Administered 2013-02-26 – 2013-03-05 (×7): 5 mg via ORAL
  Filled 2013-02-25 (×9): qty 1

## 2013-02-25 MED ORDER — SODIUM CHLORIDE 0.9 % IJ SOLN
3.0000 mL | Freq: Two times a day (BID) | INTRAMUSCULAR | Status: DC
  Administered 2013-02-25 – 2013-03-05 (×8): 3 mL via INTRAVENOUS

## 2013-02-25 NOTE — Progress Notes (Signed)
Utilization Review completed.  Arnie Maiolo RN CM  

## 2013-02-25 NOTE — Progress Notes (Signed)
INITIAL NUTRITION ASSESSMENT  DOCUMENTATION CODES Per approved criteria  -Obesity Unspecified   INTERVENTION: Diet advancement per MD discretion Provide Glucerna Shake po TID when diet advances, each supplement provides 220 kcal and 10 grams of protein. Provide Juven BID to aid wound healing Provide Multivitamin with minerals daily  NUTRITION DIAGNOSIS: 1: Inadequate oral intake related to decreased appetite as evidenced by 11% wt loss in less than 2 months.   2: Increased nutrient needs related to wound healing as evidenced by pt with stage 3 pressure ulcer.  Goal: Pt to meet >/= 90% of their estimated nutrition needs  Monitor:  Diet advancement/PO intake Weight Labs  Reason for Assessment: MST  79 y.o. female  Admitting Dx: Acute pancreatitis  ASSESSMENT: From H&P: 79 y.o. female who presents to the ED with ongoing decline gradually and progressively occuring over the past 4 weeks after being released from [facility] to live at home with her son. She has had a slow mental and physical decline since coming home with decreasing appetite, decreasing mobility, increasing generalized weakness and confusion. Over the past couple of days however she has had marked decline with diarrhea, and then nausea and vomiting beginning this morning. The patient is not really able to provide much additional history due to AMS. Pt has been very lethargic per RN; RD attempted to awake pt but, no response. Unable to obtain further history at this time. Per weight history below pt has lost 21 lbs in the past month and a half; 11% wt loss.  Height: Ht Readings from Last 1 Encounters:  02/25/13 5\' 3"  (1.6 m)    Weight: Wt Readings from Last 1 Encounters:  02/25/13 174 lb (78.926 kg)    Ideal Body Weight: 115 lbs  % Ideal Body Weight: 151%  Wt Readings from Last 10 Encounters:  02/25/13 174 lb (78.926 kg)  01/29/13 192 lb (87.091 kg)  01/12/13 195 lb 6.4 oz (88.633 kg)  04/08/11 223 lb 12  oz (101.492 kg)  04/03/11 212 lb (96.163 kg)  09/27/10 217 lb (98.431 kg)  03/13/10 223 lb (101.152 kg)  09/06/09 218 lb (98.884 kg)  07/18/09 220 lb (99.791 kg)  02/16/09 211 lb (95.709 kg)    Usual Body Weight: unknown  % Usual Body Weight: NA  BMI:  Body mass index is 30.83 kg/(m^2).  Estimated Nutritional Needs: Kcal: 1735-1970 Protein: 110-128 grams Fluid: 2.4 L  Skin: Stage 3 pressure ulcer on buttocks  Diet Order:    EDUCATION NEEDS: -No education needs identified at this time   Intake/Output Summary (Last 24 hours) at 02/25/13 1306 Last data filed at 02/25/13 0600  Gross per 24 hour  Intake 468.75 ml  Output    250 ml  Net 218.75 ml    Last BM: 5/15  Labs:   Recent Labs Lab 02/24/13 2156 02/25/13 0535  NA 137 139  K 2.7* 2.9*  CL 101 106  CO2 14* 17*  BUN 68* 65*  CREATININE 2.89* 2.44*  CALCIUM 10.2 10.0  GLUCOSE 274* 153*    CBG (last 3)   Recent Labs  02/25/13 0111 02/25/13 0337  GLUCAP 204* 183*    Scheduled Meds: . amLODipine  5 mg Oral Daily  . aspirin EC  81 mg Oral Daily  . atorvastatin  20 mg Oral q1800  . atropine  1 drop Left Eye BID  . bimatoprost  1 drop Left Eye Daily  . citalopram  20 mg Oral Daily  . dorzolamide-timolol  1 drop Both  Eyes BID  . gabapentin  600 mg Oral TID  . heparin  5,000 Units Subcutaneous Q8H  . hydrALAZINE  100 mg Oral TID  . insulin aspart  0-15 Units Subcutaneous Q4H  . metoprolol tartrate  75 mg Oral BID  . pantoprazole  40 mg Oral q morning - 10a  . sodium chloride  1 drop Left Eye BID  . sodium chloride  3 mL Intravenous Q12H    Continuous Infusions: . sodium chloride 125 mL/hr at 02/25/13 0303    Past Medical History  Diagnosis Date  . Coronary artery disease   . Renal artery stenosis   . Hypertension   . Dyslipidemia   . GERD (gastroesophageal reflux disease)   . Tension headache   . Abdominal pain   . Anemia, unspecified   . Gout   . Back pain, chronic   . History of  tonsillectomy   . History of cholecystectomy   . Depression   . IDDM (insulin dependent diabetes mellitus)   . Diabetes mellitus   . CHF (congestive heart failure)     Past Surgical History  Procedure Laterality Date  . Tonsillectomy    . Cholecystectomy  40 years ago    Ian Malkin RD, LDN Inpatient Clinical Dietitian Pager: [phone] After Hours Pager: [phone]

## 2013-02-25 NOTE — Progress Notes (Signed)
Pt had no urine output since & 7am, reported that pt is incontinent. No documented urine output since pt admitted on the floor. Bladder scan showed over 700cc urine. Will place a Foley.

## 2013-02-25 NOTE — H&P (Addendum)
[facility] History and Physical  BRIARROSE SHOR [name]:[id number] DOB: 01/02/1928 DOA: 02/24/2013  Referring physician: ED PCP: Illene Regulus, MD  Specialists: None  Chief Complaint: weakness, N/V/D  HPI: [name] is a 79 y.o. female who presents to the ED with ongoing decline gradually and progressively occuring over the past 4 weeks after being released from [facility] to live at home with her son.  She has had a slow mental and physical decline since coming home with decreasing appetite, decreasing mobility, increasing generalized weakness and confusion.  Over the past couple of days however she has had marked decline with diarrhea, and then nausea and vomiting beginning this morning.  The patient is not really able to provide much additional history due to AMS.  In the ED she was noted to have a tender abdomen, lab work revealed a lipse of 1200, and AKI with creatinine 2.89 (baseline 1.7).  She was given 2 runs of IV potassium for K 2.7, and hospitalist has been asked to admit for apparent acute pancreatitis.  Review of Systems: Cannot be performed due to AMS.  Past Medical History  Diagnosis Date  . Coronary artery disease   . Renal artery stenosis   . Hypertension   . Dyslipidemia   . GERD (gastroesophageal reflux disease)   . Tension headache   . Abdominal pain   . Anemia, unspecified   . Gout   . Back pain, chronic   . History of tonsillectomy   . History of cholecystectomy   . Depression   . IDDM (insulin dependent diabetes mellitus)   . Diabetes mellitus   . CHF (congestive heart failure)    Past Surgical History  Procedure Laterality Date  . Tonsillectomy    . Cholecystectomy  40 years ago   Social History:  reports that she has never smoked. She does not have any smokeless tobacco history on file. She reports that she does not drink alcohol or use illicit drugs.   No Known Allergies  Family History  Problem Relation Age of Onset  . Coronary  artery disease Father   . Hypertension Father   . Heart attack Father   . Hyperlipidemia Father   . Diabetes Sister   . Hypertension Sister   . Hyperlipidemia Sister   . Hypertension Brother   . Diabetes Brother   . Hyperlipidemia Brother   . Cancer Neg Hx     Breast and colon   Prior to Admission medications   Medication Sig Start Date End Date Taking? Authorizing Provider  allopurinol (ZYLOPRIM) 100 MG tablet Take 2 tablets (200 mg total) by mouth daily. 01/12/13  Yes Jacques Navy, MD  amLODipine (NORVASC) 5 MG tablet Take 5 mg by mouth daily.   Yes Historical Provider, MD  aspirin 81 MG tablet Take 81 mg by mouth daily.     Yes Historical Provider, MD  atropine 1 % ophthalmic solution Place 1 drop into the left eye 2 (two) times daily.   Yes Historical Provider, MD  bimatoprost (LUMIGAN) 0.03 % ophthalmic solution Place 1 drop into the left eye daily.    Yes Historical Provider, MD  cholecalciferol (VITAMIN D) 1000 UNITS tablet Take 1 tablet (1,000 Units total) by mouth daily. 01/29/13 01/29/14 Yes Georgina Quint Plotnikov, MD  citalopram (CELEXA) 20 MG tablet Take 20 mg by mouth daily.     Yes Historical Provider, MD  colchicine 0.6 MG tablet Take 1 tablet (0.6 mg total) by mouth as needed. Takes 2 tablets  daily as needed for gout flareup. 01/12/13  Yes Jacques Navy, MD  diazepam (VALIUM) 5 MG tablet Take 1 tablet (5 mg total) by mouth every 12 (twelve) hours as needed for anxiety or sleep. 01/29/13  Yes Georgina Quint Plotnikov, MD  dorzolamide-timolol (COSOPT) 22.3-6.8 MG/ML ophthalmic solution Place 1 drop into both eyes 2 (two) times daily.   Yes Historical Provider, MD  ergocalciferol (VITAMIN D2) 50000 UNITS capsule Take 50,000 Units by mouth once a week.     Yes Historical Provider, MD  furosemide (LASIX) 80 MG tablet Take 80 mg by mouth daily.   Yes Historical Provider, MD  gabapentin (NEURONTIN) 600 MG tablet Take 1 tablet (600 mg total) by mouth 3 (three) times daily. 07/12/11  Yes  Jacques Navy, MD  hydrALAZINE (APRESOLINE) 100 MG tablet Take 100 mg by mouth 3 (three) times daily.   Yes Historical Provider, MD  insulin glargine (LANTUS) 100 UNIT/ML injection Inject 6 Units into the skin at bedtime.   Yes Historical Provider, MD  insulin lispro (HUMALOG) 100 UNIT/ML injection Inject 4 Units into the skin 3 (three) times daily before meals.   Yes Historical Provider, MD  metoprolol tartrate (LOPRESSOR) 25 MG tablet Take 75 mg by mouth 2 (two) times daily.   Yes Historical Provider, MD  nitroGLYCERIN (NITROSTAT) 0.4 MG SL tablet Place 0.4 mg under the tongue as needed. For chest pain.    Yes Historical Provider, MD  ondansetron (ZOFRAN) 4 MG tablet Take 1 tablet (4 mg total) by mouth every 8 (eight) hours as needed for nausea. 02/02/13  Yes Corwin Levins, MD  pantoprazole (PROTONIX) 40 MG tablet Take 40 mg by mouth every morning.   Yes Historical Provider, MD  potassium chloride (K-DUR) 10 MEQ tablet Take 2 tablets (20 mEq total) by mouth daily. 01/12/13  Yes Jacques Navy, MD  pravastatin (PRAVACHOL) 80 MG tablet Take 80 mg by mouth at bedtime.     Yes Historical Provider, MD  sodium chloride (MURO 128) 5 % ophthalmic solution Place 1 drop into the left eye 2 (two) times daily.   Yes Historical Provider, MD   Physical Exam: Filed Vitals:   02/24/13 2051 02/25/13 0003  BP: 147/59 155/55  Pulse: 73 73  Temp: 98.7 F (37.1 C)   TempSrc: Oral   Resp: 18 15  SpO2: 97% 100%     General:  NAD, resting comfortably in bed  Eyes: PEERLA EOMI  ENT: mucous membranes moist  Neck: supple w/o JVD  Cardiovascular: RRR w/o MRG  Respiratory: CTA B  Abdomen: soft, diffuse tenderness with guarding but no rebound, bs+  Skin: no rash nor lesion  Musculoskeletal: MAE, full ROM all 4 extremities  Psychiatric: cannot assess due to delerium  Neurologic: Appears delirious, alert, answering a few questions but not really oriented,    Labs on Admission:  Basic Metabolic  Panel:  Recent Labs Lab 02/24/13 2156  NA 137  K 2.7*  CL 101  CO2 14*  GLUCOSE 274*  BUN 68*  CREATININE 2.89*  CALCIUM 10.2   Liver Function Tests:  Recent Labs Lab 02/24/13 2156  AST 17  ALT 8  ALKPHOS 149*  BILITOT 0.4  PROT 7.4  ALBUMIN 3.6    Recent Labs Lab 02/24/13 2156  LIPASE 1370*   No results found for this basename: AMMONIA,  in the last 168 hours CBC:  Recent Labs Lab 02/24/13 2156  WBC 9.2  NEUTROABS 8.2*  HGB 11.6*  HCT 34.9*  MCV 82.1  PLT PLATELET CLUMPS NOTED ON SMEAR, COUNT APPEARS ADEQUATE   Cardiac Enzymes:  Recent Labs Lab 02/24/13 2156  TROPONINI <0.30    BNP (last 3 results) No results found for this basename: PROBNP,  in the last 8760 hours CBG: No results found for this basename: GLUCAP,  in the last 168 hours  Radiological Exams on Admission: Ct Abdomen Pelvis Wo Contrast  02/24/2013   *RADIOLOGY REPORT*  Clinical Data: Abdominal pain  CT ABDOMEN AND PELVIS WITHOUT CONTRAST  Technique:  Multidetector CT imaging of the abdomen and pelvis was performed following the standard protocol without intravenous contrast.  Comparison: 12/30/2012  Findings: Coronary artery calcification.  Tiny hiatal hernia.  Organ abnormality/lesion detection is limited in the absence of intravenous contrast. Within this limitation, unremarkable liver, spleen, pancreas, adrenal glands.  Absent gallbladder.  No biliary ductal dilatation.  Lobular renal contours.  No hydronephrosis or hydroureter.  Colonic diverticulosis.  No overt diverticulitis.  Small bowel loops are normal course and caliber.  No free intraperitoneal air or fluid.  No lymphadenopathy.  There is scattered atherosclerotic calcification of the aorta and its branches. No aneurysmal dilatation.  Decompressed bladder.  Fibroid uterus.  No adnexal mass.  Stranding of the subcutaneous fat right posterior to the coccyx. On the left more inferiorly, stranding of the subcutaneous fat along the left  gluteal cleft.  Diffuse osteopenia and multilevel degenerative changes.  No acute osseous finding. CT is limited sensitivity for osteomyelitis detection.  IMPRESSION: Colonic diverticulosis without overt diverticulitis.  Bilateral subcutaneous fat stranding posteriorly may reflect decubitus ulcers.  Recommend direct inspection.   Original Report Authenticated By: Jearld Lesch, M.D.   Dg Chest 2 View  02/24/2013   *RADIOLOGY REPORT*  Clinical Data: Altered mental status, shortness of breath.  CHEST - 2 VIEW  Comparison: 01/06/2013  Findings: Cardiomegaly with vascular congestion.  No overt edema. No focal airspace opacities or effusions.  No acute bony abnormality.  IMPRESSION: Cardiomegaly, vascular congestion.   Original Report Authenticated By: Charlett Nose, M.D.    EKG: Independently reviewed.  Assessment/Plan Principal Problem:   Acute pancreatitis Active Problems:   IDDM   Acute-on-chronic kidney injury   Hypokalemia   1. Acute pancreatitis - Supportive care with fluid resusitation, IVF ordered at 125 cc/hr at this time.  No leukocytosis or indication for ABx treatment at this time.  Lipase 1370.  As expressed to family am also concerned given that the CXR demonstrated vascular congestion how well she is going to tolerate the aggressive fluid resuscitation that is essential to the treatment of acute pancreatitis.  Cause unclear, had galbladder out some 40 years ago and normal bile duct on CT, on no meds that are known to cause this, lipid panel ordered for tomorrow morning, per family she does not drink and has no h/o scorpion stings. 2. AKI on CKD - holding home nephrotoxic meds, IVF as above, As expressed to the family, am more concerned regarding this given the fact that last admission the patient wound up having to get dialysis 3 times for AKI which then later resolved.  Suspect that patient may require slightly higher BPs to perfuse kidneys than otherwise would normally expect given  her renal artery stenosis. 3. Hypokalemia - resusitating to 3.0 goal given presence of #2 4. HTN - continue home meds 5. AMS - clinically appears to be delirium from issue #1    Code Status: DNR/DNI (must indicate code status--if unknown or must be presumed, indicate so) Family  Communication: Spoke with family at bedside (indicate person spoken with, if applicable, with phone number if by telephone) Disposition Plan: Admit to inpatient (indicate anticipated LOS)  Time spent: 70 min  Mirha Brucato M. [facility] Pager [phone]  If 7PM-7AM, please contact night-coverage [email or web address] Password [facility] 02/25/2013, 12:25 AM

## 2013-02-25 NOTE — ED Notes (Signed)
Called to get report nurse unavailable will call back.  

## 2013-02-25 NOTE — Progress Notes (Signed)
Patient seen and admitted earlier this am by my associate.  Please refer to his H and P for assessment and plan.  Will reassess next am.  Nurse reported patient had not voided and had 700 cc on bladder scan.  Order was placed for foley catheter.  Ismar Yabut, [employer]

## 2013-02-26 DIAGNOSIS — N189 Chronic kidney disease, unspecified: Secondary | ICD-10-CM

## 2013-02-26 DIAGNOSIS — R109 Unspecified abdominal pain: Secondary | ICD-10-CM

## 2013-02-26 DIAGNOSIS — K859 Acute pancreatitis without necrosis or infection, unspecified: Principal | ICD-10-CM

## 2013-02-26 DIAGNOSIS — E876 Hypokalemia: Secondary | ICD-10-CM

## 2013-02-26 DIAGNOSIS — R4182 Altered mental status, unspecified: Secondary | ICD-10-CM

## 2013-02-26 DIAGNOSIS — F3289 Other specified depressive episodes: Secondary | ICD-10-CM

## 2013-02-26 DIAGNOSIS — F329 Major depressive disorder, single episode, unspecified: Secondary | ICD-10-CM

## 2013-02-26 DIAGNOSIS — M109 Gout, unspecified: Secondary | ICD-10-CM

## 2013-02-26 DIAGNOSIS — N179 Acute kidney failure, unspecified: Secondary | ICD-10-CM

## 2013-02-26 LAB — BASIC METABOLIC PANEL
BUN: 53 mg/dL — ABNORMAL HIGH (ref 6–23)
Calcium: 9.7 mg/dL (ref 8.4–10.5)
GFR calc non Af Amer: 28 mL/min — ABNORMAL LOW (ref >90)
Glucose, Bld: 149 mg/dL — ABNORMAL HIGH (ref 70–99)

## 2013-02-26 LAB — GLUCOSE, CAPILLARY
Glucose-Capillary: 149 mg/dL — ABNORMAL HIGH (ref 70–99)
Glucose-Capillary: 141 mg/dL — ABNORMAL HIGH (ref 70–99)
Glucose-Capillary: 173 mg/dL — ABNORMAL HIGH (ref 70–99)

## 2013-02-26 LAB — CBC
MCH: 26.6 pg (ref 26.0–34.0)
MCHC: 32.3 g/dL (ref 30.0–36.0)
Platelets: ADEQUATE 10*3/uL (ref 150–400)

## 2013-02-26 MED ORDER — POTASSIUM CHLORIDE CRYS ER 20 MEQ PO TBCR
40.0000 meq | EXTENDED_RELEASE_TABLET | Freq: Once | ORAL | Status: AC
  Administered 2013-02-26: 40 meq via ORAL
  Filled 2013-02-26: qty 2

## 2013-02-26 MED ORDER — ADULT MULTIVITAMIN W/MINERALS CH
1.0000 | ORAL_TABLET | Freq: Every day | ORAL | Status: DC
  Administered 2013-02-26 – 2013-03-05 (×7): 1 via ORAL
  Filled 2013-02-26 (×8): qty 1

## 2013-02-26 NOTE — Progress Notes (Signed)
Clinical Social Work Department BRIEF PSYCHOSOCIAL ASSESSMENT 02/26/2013  Patient:  [name], [name]     Account Number:  [id number]     Admit date:  02/24/2013  Clinical Social Worker:  Orpah Greek  Date/Time:  02/26/2013 12:10 PM  Referred by:  Physician  Date Referred:  02/26/2013 Referred for  SNF Placement   Other Referral:   Interview type:  Family Other interview type:    PSYCHOSOCIAL DATA Living Status:  FAMILY Admitted from facility:   Level of care:   Primary support name:  Nyimah Shadduck (daughter) ph#: [phone] Primary support relationship to patient:  CHILD, ADULT Degree of support available:   good    CURRENT CONCERNS Current Concerns  Post-Acute Placement   Other Concerns:    SOCIAL WORK ASSESSMENT / PLAN CSW spoke with patient's daughter, [name] re: discharge planning. Note that patient was recently discharged from [facility] on 01/26/13. PT recommended SNF at discharge.   Assessment/plan status:  Information/Referral to [facility] Other assessment/ plan:   Information/referral to community resources:   CSW completed FL2 and faxed information to [facility] SNFs - confirmed with [facility] that they would be able to take patient when ready for discharge.    PATIENT'S/FAMILY'S RESPONSE TO PLAN OF CARE: Patient's daughter was pleased to hear that patient would be able to return to [facility].       Unice Bailey, LCSW [facility] Clinical Social Worker cell #: [phone]

## 2013-02-26 NOTE — Evaluation (Addendum)
Physical Therapy Evaluation Patient Details Name: [name] MRN: [id number] DOB: July 29, 1928 Today's Date: 02/26/2013 Time: 4098-1191 PT Time Calculation (min): 27 min  PT Assessment / Plan / Recommendation Clinical Impression  79 y.o. female with h/o IDDM, admitted with AKI, hypokalemia, N/V/D, gradual decline x 1 month, pancreatitis.  Bed to recliner transfer with mod assist. Pt with flat affect, lethargic but responsive. Increased time for mobility and to respond to questions. 24* assist at SNF level recommended if family unable to provide 24* assist. Pt would benefit from acute PT to maximize safety and independence with mobility.     PT Assessment  Patient needs continued PT services    Follow Up Recommendations  SNF    Does the patient have the potential to tolerate intense rehabilitation      Barriers to Discharge        Equipment Recommendations  Wheelchair (measurements PT)    Recommendations for Other Services     Frequency Min 3X/week    Precautions / Restrictions Precautions Precautions: Fall Restrictions Weight Bearing Restrictions: No   Pertinent Vitals/Pain *pt denies pain**      Mobility  Bed Mobility Bed Mobility: Supine to Sit Supine to Sit: 3: Mod assist;HOB elevated Transfers Transfers: Sit to Stand;Stand to Sit;Stand Pivot Transfers Sit to Stand: 3: Mod assist Stand to Sit: 3: Mod assist Stand Pivot Transfers: 3: Mod assist Details for Transfer Assistance: mod assist to rise, assist for balance 2* mild posterior lean, increased time for all mobility, SPT bed to chair with RW, uncontrolled descent to chair Ambulation/Gait Ambulation/Gait Assistance: Not tested (comment)    Exercises     PT Diagnosis: Difficulty walking;Generalized weakness  PT Problem List: Decreased activity tolerance;Decreased balance;Decreased mobility;Decreased safety awareness PT Treatment Interventions: Gait training;DME instruction;Functional mobility  training;Therapeutic activities;Therapeutic exercise;Patient/family education   PT Goals Acute Rehab PT Goals PT Goal Formulation: With patient Time For Goal Achievement: 03/12/13 Potential to Achieve Goals: Fair Pt will go Supine/Side to Sit: with min assist PT Goal: Supine/Side to Sit - Progress: Goal set today Pt will go Sit to Stand: with min assist PT Goal: Sit to Stand - Progress: Goal set today Pt will Transfer Bed to Chair/Chair to Bed: with min assist PT Transfer Goal: Bed to Chair/Chair to Bed - Progress: Goal set today Pt will Ambulate: 1 - 15 feet;with min assist;with rolling walker PT Goal: Ambulate - Progress: Goal set today  Visit Information  Last PT Received On: 02/26/13 Assistance Needed: +1    Subjective Data  Subjective: Can I have some water? Patient Stated Goal: none stated   Prior Functioning  Home Living Lives With: Alone Available Help at Discharge: Family Type of Home: House Home Access: Level entry Home Layout: One level Home Adaptive Equipment: Walker - rolling;Shower chair with back Prior Function Level of Independence: Independent with assistive device(s) Driving: No Communication Communication: No difficulties (increased time to respond)    Cognition  Cognition Arousal/Alertness: Lethargic Behavior During Therapy: Flat affect Overall Cognitive Status: No family/caregiver present to determine baseline cognitive functioning (increased time to respond to questions, lethargic)    Extremity/Trunk Assessment Right Upper Extremity Assessment RUE ROM/Strength/Tone: [facility] for tasks assessed Left Upper Extremity Assessment LUE ROM/Strength/Tone: WFL for tasks assessed Right Lower Extremity Assessment RLE ROM/Strength/Tone: WFL for tasks assessed RLE Coordination: WFL - gross/fine motor Left Lower Extremity Assessment LLE ROM/Strength/Tone: WFL for tasks assessed LLE Coordination: WFL - gross/fine motor Trunk Assessment Trunk Assessment: Normal    Balance Balance Balance Assessed: Yes Static  Sitting Balance Static Sitting - Balance Support: Bilateral upper extremity supported;Feet supported Static Sitting - Level of Assistance: 5: Stand by assistance;4: Min assist Static Sitting - Comment/# of Minutes: 3 minutes, intermittent min A for mild posterior lean Static Standing Balance Static Standing - Balance Support: Bilateral upper extremity supported Static Standing - Level of Assistance: 4: Min assist Static Standing - Comment/# of Minutes: 2 minutes, min A for posterior lean  End of Session PT - End of Session Equipment Utilized During Treatment: Gait belt Activity Tolerance: Patient limited by fatigue Patient left: in chair;with call bell/phone within reach Nurse Communication: Mobility status  GP     Ralene Bathe Kistler 02/26/2013, 11:01 AM [phone]

## 2013-02-26 NOTE — Progress Notes (Addendum)
[facility] PROGRESS NOTE  [name] ZOX:[id number] DOB: 11/26/1927 DOA: 02/24/2013 PCP: Illene Regulus, MD  Assessment/Plan:  1) Acute pancreatitis  - Patient currently requesting diet.  Will advance diet to clears and advance as tolerated to full then low fat diet. - monitor daily lipase levels - decrease fluids and given improvement of po intake place on 50 cc/hr - Denies any abdominal pain. No emesis reported - Most likely related to statin.  Will check fasting lipid panel  2. AMS - likely due to dehydration and # 1 - not sure what baseline is at this moment  3. AKI on CKD - Continue to hold nephrotoxic agents - Improved with IVF administration as such likely prerenal - check creatinine level next am  4. Hypokalemia - Replace orally and reassess next am  5. HTN - Relatively well controlled on current regimen - will continue current regimen.  6. HPL - continue aspirin - will hold statin given # 1  Addendum 7 IDDM - Continue SSI - monitor blood sugars.  Code Status: DNR Family Communication: no family at bedside. Disposition Plan: Pending continued improvement in condition and advancement of diet with resolution of emesis/nausea   Consultants:  none  Procedures:  none  Antibiotics:  none   HPI/Subjective: Patient has limited responses to questions with either yes or no.  No acute issues reported overnight.  Objective: Filed Vitals:   02/25/13 1722 02/25/13 2048 02/25/13 2220 02/26/13 1330  BP: 126/50 131/48 141/51 129/65  Pulse: 56 55 60 68  Temp:  98.6 F (37 C) 98.6 F (37 C) 98.8 F (37.1 C)  TempSrc:  Oral Oral Oral  Resp: 20 18 18 17   Height:      Weight:      SpO2: 100% 100% 100% 100%    Intake/Output Summary (Last 24 hours) at 02/26/13 1429 Last data filed at 02/25/13 1859  Gross per 24 hour  Intake      0 ml  Output   1150 ml  Net  -1150 ml   Filed Weights   02/25/13 0300  Weight: 78.926 kg (174 lb)     Exam:   General:  Pt in NAD, Alert and Awake  Cardiovascular: RRR, no MRG  Respiratory: CTA BL, no wheezes  Abdomen: soft, ND, epigastric discomfort with deep palpation  Musculoskeletal: no cyanosis or clubbing   Data Reviewed: Basic Metabolic Panel:  Recent Labs Lab 02/24/13 2156 02/25/13 0535 02/26/13 0434  NA 137 139 144  K 2.7* 2.9* 2.8*  CL 101 106 115*  CO2 14* 17* 14*  GLUCOSE 274* 153* 149*  BUN 68* 65* 53*  CREATININE 2.89* 2.44* 1.65*  CALCIUM 10.2 10.0 9.7  MG  --   --  1.9   Liver Function Tests:  Recent Labs Lab 02/24/13 2156 02/25/13 0535  AST 17 21  ALT 8 9  ALKPHOS 149* 135*  BILITOT 0.4 0.4  PROT 7.4 7.2  ALBUMIN 3.6 3.3*    Recent Labs Lab 02/24/13 2156  LIPASE 1370*   No results found for this basename: AMMONIA,  in the last 168 hours CBC:  Recent Labs Lab 02/24/13 2156 02/25/13 0535 02/26/13 0434  WBC 9.2 12.6* 9.6  NEUTROABS 8.2*  --   --   HGB 11.6* 10.9* 9.7*  HCT 34.9* 33.3* 30.0*  MCV 82.1 82.4 82.4  PLT PLATELET CLUMPS NOTED ON SMEAR, COUNT APPEARS ADEQUATE 145* PLATELET CLUMPS NOTED ON SMEAR, COUNT APPEARS ADEQUATE   Cardiac Enzymes:  Recent Labs Lab  02/24/13 2156  TROPONINI <0.30   BNP (last 3 results) No results found for this basename: PROBNP,  in the last 8760 hours CBG:  Recent Labs Lab 02/25/13 2046 02/25/13 2351 02/26/13 0436 02/26/13 0748 02/26/13 1220  GLUCAP 149* 146* 141* 132* 132*    No results found for this or any previous visit (from the past 240 hour(s)).   Studies: Ct Abdomen Pelvis Wo Contrast  02/24/2013   *RADIOLOGY REPORT*  Clinical Data: Abdominal pain  CT ABDOMEN AND PELVIS WITHOUT CONTRAST  Technique:  Multidetector CT imaging of the abdomen and pelvis was performed following the standard protocol without intravenous contrast.  Comparison: 12/30/2012  Findings: Coronary artery calcification.  Tiny hiatal hernia.  Organ abnormality/lesion detection is limited in the  absence of intravenous contrast. Within this limitation, unremarkable liver, spleen, pancreas, adrenal glands.  Absent gallbladder.  No biliary ductal dilatation.  Lobular renal contours.  No hydronephrosis or hydroureter.  Colonic diverticulosis.  No overt diverticulitis.  Small bowel loops are normal course and caliber.  No free intraperitoneal air or fluid.  No lymphadenopathy.  There is scattered atherosclerotic calcification of the aorta and its branches. No aneurysmal dilatation.  Decompressed bladder.  Fibroid uterus.  No adnexal mass.  Stranding of the subcutaneous fat right posterior to the coccyx. On the left more inferiorly, stranding of the subcutaneous fat along the left gluteal cleft.  Diffuse osteopenia and multilevel degenerative changes.  No acute osseous finding. CT is limited sensitivity for osteomyelitis detection.  IMPRESSION: Colonic diverticulosis without overt diverticulitis.  Bilateral subcutaneous fat stranding posteriorly may reflect decubitus ulcers.  Recommend direct inspection.   Original Report Authenticated By: Jearld Lesch, M.D.   Dg Chest 2 View  02/24/2013   *RADIOLOGY REPORT*  Clinical Data: Altered mental status, shortness of breath.  CHEST - 2 VIEW  Comparison: 01/06/2013  Findings: Cardiomegaly with vascular congestion.  No overt edema. No focal airspace opacities or effusions.  No acute bony abnormality.  IMPRESSION: Cardiomegaly, vascular congestion.   Original Report Authenticated By: Charlett Nose, M.D.    Scheduled Meds: . amLODipine  5 mg Oral Daily  . aspirin EC  81 mg Oral Daily  . atorvastatin  20 mg Oral q1800  . atropine  1 drop Left Eye BID  . bimatoprost  1 drop Left Eye Daily  . citalopram  20 mg Oral Daily  . dorzolamide-timolol  1 drop Both Eyes BID  . gabapentin  600 mg Oral TID  . heparin  5,000 Units Subcutaneous Q8H  . hydrALAZINE  100 mg Oral TID  . insulin aspart  0-15 Units Subcutaneous Q4H  . metoprolol tartrate  75 mg Oral BID  .  multivitamin with minerals  1 tablet Oral Daily  . pantoprazole  40 mg Oral q morning - 10a  . sodium chloride  1 drop Left Eye BID  . sodium chloride  3 mL Intravenous Q12H   Continuous Infusions: . sodium chloride 125 mL/hr at 02/26/13 0400    Principal Problem:   Acute pancreatitis Active Problems:   IDDM   Acute-on-chronic kidney injury   Hypokalemia   Altered mental status    Time spent: > 35 minutes    [name]  [facility] Pager [phone]. If 7PM-7AM, please contact night-coverage at [email or web address], password [facility] 02/26/2013, 2:29 PM  LOS: 2 days

## 2013-02-26 NOTE — Progress Notes (Addendum)
Clinical Social Work Department CLINICAL SOCIAL WORK PLACEMENT NOTE 02/26/2013  Patient:  [name], [name]  Account Number:  [id number] Admit date:  02/24/2013  Clinical Social Worker:  Orpah Greek  Date/time:  02/26/2013 01:19 PM  Clinical Social Work is seeking post-discharge placement for this patient at the following level of care:   SKILLED NURSING   (*CSW will update this form in Epic as items are completed)   02/26/2013  Patient/family provided with [facility] Department of Clinical Social Work's list of facilities offering this level of care within the geographic area requested by the patient (or if unable, by the patient's family).  02/26/2013  Patient/family informed of their freedom to choose among providers that offer the needed level of care, that participate in Medicare, Medicaid or managed care program needed by the patient, have an available bed and are willing to accept the patient.  02/26/2013  Patient/family informed of [facility]' ownership interest in [facility], as well as of the fact that they are under no obligation to receive care at this facility.  PASARR submitted to EDS on 02/26/2013 PASARR number received from EDS on 02/26/2013  FL2 transmitted to all facilities in geographic area requested by pt/family on  02/26/2013 FL2 transmitted to all facilities within larger geographic area on   Patient informed that his/her managed care company has contracts with or will negotiate with  certain facilities, including the following:     Patient/family informed of bed offers received:  02/26/2013 Patient chooses bed at [facility] Physician recommends and patient chooses bed at    Patient to be transferred to [facility] on  03/05/13 Patient to be transferred to facility by [facility]  The following physician request were entered in Epic:   Additional Comments: Unice Bailey, LCSW [facility] Clinical Social Worker cell #: [phone]

## 2013-02-27 DIAGNOSIS — K219 Gastro-esophageal reflux disease without esophagitis: Secondary | ICD-10-CM

## 2013-02-27 DIAGNOSIS — E109 Type 1 diabetes mellitus without complications: Secondary | ICD-10-CM

## 2013-02-27 DIAGNOSIS — I10 Essential (primary) hypertension: Secondary | ICD-10-CM

## 2013-02-27 LAB — CLOSTRIDIUM DIFFICILE BY PCR: Toxigenic C. Difficile by PCR: NEGATIVE

## 2013-02-27 LAB — GLUCOSE, CAPILLARY
Glucose-Capillary: 115 mg/dL — ABNORMAL HIGH (ref 70–99)
Glucose-Capillary: 140 mg/dL — ABNORMAL HIGH (ref 70–99)
Glucose-Capillary: 179 mg/dL — ABNORMAL HIGH (ref 70–99)

## 2013-02-27 LAB — BLOOD GAS, ARTERIAL
Drawn by: 331471
O2 Saturation: 94.7 %
Patient temperature: 98.6

## 2013-02-27 LAB — COMPREHENSIVE METABOLIC PANEL
Alkaline Phosphatase: 99 U/L (ref 39–117)
BUN: 43 mg/dL — ABNORMAL HIGH (ref 6–23)
CO2: 10 mEq/L — CL (ref 19–32)
Chloride: 115 mEq/L — ABNORMAL HIGH (ref 96–112)
GFR calc Af Amer: 41 mL/min — ABNORMAL LOW (ref >90)
GFR calc non Af Amer: 35 mL/min — ABNORMAL LOW (ref >90)
Glucose, Bld: 176 mg/dL — ABNORMAL HIGH (ref 70–99)
Potassium: 3.6 mEq/L (ref 3.5–5.1)
Total Bilirubin: 0.3 mg/dL (ref 0.3–1.2)

## 2013-02-27 LAB — LIPID PANEL
HDL: 16 mg/dL — ABNORMAL LOW (ref >39)
LDL Cholesterol: 38 mg/dL (ref 0–99)
Total CHOL/HDL Ratio: 5.4 RATIO
VLDL: 32 mg/dL (ref 0–40)

## 2013-02-27 MED ORDER — POTASSIUM CL IN DEXTROSE 5% 20 MEQ/L IV SOLN
20.0000 meq | INTRAVENOUS | Status: DC
  Administered 2013-02-27 – 2013-02-28 (×2): 20 meq via INTRAVENOUS
  Filled 2013-02-27 (×3): qty 1000

## 2013-02-27 MED ORDER — SODIUM BICARBONATE 650 MG PO TABS
650.0000 mg | ORAL_TABLET | Freq: Once | ORAL | Status: DC
  Filled 2013-02-27: qty 1

## 2013-02-27 MED ORDER — SODIUM BICARBONATE 650 MG PO TABS
325.0000 mg | ORAL_TABLET | Freq: Every day | ORAL | Status: DC
  Filled 2013-02-27: qty 0.5

## 2013-02-27 MED ORDER — SODIUM BICARBONATE 650 MG PO TABS
650.0000 mg | ORAL_TABLET | Freq: Every day | ORAL | Status: DC
  Administered 2013-02-27 – 2013-02-28 (×2): 650 mg via ORAL
  Filled 2013-02-27 (×2): qty 1

## 2013-02-27 NOTE — Progress Notes (Addendum)
CRITICAL VALUE ALERT  Critical value received:  Blood Co2-10  Date of notification:  02/27/2013  Time of notification: 1332  Critical value read back:yes  Nurse who received alert:  Sela Hua RN  MD notified (1st page):  Dr. Cena Benton  Time of first page:  1336- Notified of level by text page.   MD notified (2nd page):  Time of second page:  Responding MD:  Dr. Cena Benton to Tamala Bari RN  Time MD responded:  [phone]

## 2013-02-27 NOTE — Progress Notes (Signed)
Per MD, Pt ready for d/c.  Notified Sam at [place].  Sam contacted facility's DON.  Per Sam, DON stated that the facility cannot accept Pt over the weekend.  Notified MD.  Providence Crosby, LCSWA Clinical Social Work [phone]

## 2013-02-27 NOTE — Progress Notes (Addendum)
[facility] PROGRESS NOTE  [name]:[id number] DOB: May 15, 1928 DOA: 02/24/2013 PCP: Illene Regulus, MD  Assessment/Plan:  1) Acute pancreatitis  - Patient currently requesting diet.  Will advance diet to as tolerated to low fat diet. - monitor daily lipase levels. Which are currently trending down at 270 from 1370 - decreased fluids and given improvement of po intake place on 50 cc/hr - Denies any abdominal pain. No emesis reported - Most likely related to statin.  - Lipid panel reviewed and not likely cause of pancreatitis  2. AMS - likely due to dehydration and # 1 - not sure what baseline is at this moment - More responsive today 5/17 with obvious improvement in condition.  3. AKI on CKD - Continue to hold nephrotoxic agents - Improved with IVF administration as such likely prerenal - check creatinine level next am  4. Hypokalemia - Replace orally and reassess next am  5. HTN - Relatively well controlled on current regimen - will continue current regimen.  6. HPL - continue aspirin - will hold statin given # 1  7 IDDM - Continue SSI - monitor blood sugars.  Code Status: DNR Family Communication: no family at bedside. Disposition Plan: Pending continued improvement in condition and advancement of diet with resolution of emesis/nausea   Consultants:  none  Procedures:  none  Antibiotics:  none   HPI/Subjective: Patient mentions that she has not had any more emesis.  Eating solid food this am.  No new complaints.  Objective: Filed Vitals:   02/26/13 1330 02/26/13 2137 02/27/13 0536 02/27/13 0647  BP: 129/65 128/44 115/43   Pulse: 68 52 54   Temp: 98.8 F (37.1 C) 97.7 F (36.5 C) 97.7 F (36.5 C)   TempSrc: Oral Oral Oral   Resp: 17 18 18    Height:      Weight:    83.9 kg (184 lb 15.5 oz)  SpO2: 100% 99% 97%     Intake/Output Summary (Last 24 hours) at 02/27/13 1231 Last data filed at 02/27/13 0700  Gross per 24 hour   Intake   2280 ml  Output   1000 ml  Net   1280 ml   Filed Weights   02/25/13 0300 02/27/13 0647  Weight: 78.926 kg (174 lb) 83.9 kg (184 lb 15.5 oz)    Exam:   General:  Pt in NAD, Alert and Awake  Cardiovascular: RRR, no MRG  Respiratory: CTA BL, no wheezes  Abdomen: soft, ND, NT  Musculoskeletal: no cyanosis or clubbing   Data Reviewed: Basic Metabolic Panel:  Recent Labs Lab 02/24/13 2156 02/25/13 0535 02/26/13 0434  NA 137 139 144  K 2.7* 2.9* 2.8*  CL 101 106 115*  CO2 14* 17* 14*  GLUCOSE 274* 153* 149*  BUN 68* 65* 53*  CREATININE 2.89* 2.44* 1.65*  CALCIUM 10.2 10.0 9.7  MG  --   --  1.9   Liver Function Tests:  Recent Labs Lab 02/24/13 2156 02/25/13 0535  AST 17 21  ALT 8 9  ALKPHOS 149* 135*  BILITOT 0.4 0.4  PROT 7.4 7.2  ALBUMIN 3.6 3.3*    Recent Labs Lab 02/24/13 2156 02/27/13 0520  LIPASE 1370* 270*   No results found for this basename: AMMONIA,  in the last 168 hours CBC:  Recent Labs Lab 02/24/13 2156 02/25/13 0535 02/26/13 0434  WBC 9.2 12.6* 9.6  NEUTROABS 8.2*  --   --   HGB 11.6* 10.9* 9.7*  HCT 34.9* 33.3* 30.0*  MCV 82.1 82.4 82.4  PLT PLATELET CLUMPS NOTED ON SMEAR, COUNT APPEARS ADEQUATE 145* PLATELET CLUMPS NOTED ON SMEAR, COUNT APPEARS ADEQUATE   Cardiac Enzymes:  Recent Labs Lab 02/24/13 2156  TROPONINI <0.30   BNP (last 3 results) No results found for this basename: PROBNP,  in the last 8760 hours CBG:  Recent Labs Lab 02/26/13 1959 02/27/13 0021 02/27/13 0407 02/27/13 0741 02/27/13 1219  GLUCAP 140* 129* 115* 140* 179*    No results found for this or any previous visit (from the past 240 hour(s)).   Studies: No results found.  Scheduled Meds: . amLODipine  5 mg Oral Daily  . aspirin EC  81 mg Oral Daily  . atropine  1 drop Left Eye BID  . bimatoprost  1 drop Left Eye Daily  . citalopram  20 mg Oral Daily  . dorzolamide-timolol  1 drop Both Eyes BID  . gabapentin  600 mg Oral TID   . heparin  5,000 Units Subcutaneous Q8H  . hydrALAZINE  100 mg Oral TID  . insulin aspart  0-15 Units Subcutaneous Q4H  . metoprolol tartrate  75 mg Oral BID  . multivitamin with minerals  1 tablet Oral Daily  . pantoprazole  40 mg Oral q morning - 10a  . sodium chloride  1 drop Left Eye BID  . sodium chloride  3 mL Intravenous Q12H   Continuous Infusions: . sodium chloride 50 mL/hr at 02/27/13 0235    Principal Problem:   Acute pancreatitis Active Problems:   IDDM   DYSLIPIDEMIA   Acute-on-chronic kidney injury   Hypokalemia   Altered mental status    Time spent: > 35 minutes    [name]  [facility] Pager [phone]. If 7PM-7AM, please contact night-coverage at [email or web address], password [facility] 02/27/2013, 12:31 PM  LOS: 3 days    Addendum:  Called about CO2 level of 10  Anion gap is 14  At this point suspecting Hyperchloremic normal anion gap acidosis.  Will change IVF's and reassess next am.  Called nursing and updated with new plans.  Preet Mangano, [employer]

## 2013-02-28 DIAGNOSIS — E872 Acidosis, unspecified: Secondary | ICD-10-CM

## 2013-02-28 LAB — GLUCOSE, CAPILLARY
Glucose-Capillary: 145 mg/dL — ABNORMAL HIGH (ref 70–99)
Glucose-Capillary: 168 mg/dL — ABNORMAL HIGH (ref 70–99)
Glucose-Capillary: 203 mg/dL — ABNORMAL HIGH (ref 70–99)
Glucose-Capillary: 310 mg/dL — ABNORMAL HIGH (ref 70–99)

## 2013-02-28 LAB — BASIC METABOLIC PANEL
BUN: 44 mg/dL — ABNORMAL HIGH (ref 6–23)
Chloride: 109 mEq/L (ref 96–112)
GFR calc Af Amer: 35 mL/min — ABNORMAL LOW (ref >90)
Glucose, Bld: 185 mg/dL — ABNORMAL HIGH (ref 70–99)
Potassium: 4 mEq/L (ref 3.5–5.1)
Sodium: 132 mEq/L — ABNORMAL LOW (ref 135–145)

## 2013-02-28 MED ORDER — POTASSIUM CL IN DEXTROSE 5% 20 MEQ/L IV SOLN
20.0000 meq | INTRAVENOUS | Status: DC
  Administered 2013-02-28 – 2013-03-01 (×2): 20 meq via INTRAVENOUS
  Filled 2013-02-28 (×2): qty 1000

## 2013-02-28 MED ORDER — SODIUM BICARBONATE 650 MG PO TABS
650.0000 mg | ORAL_TABLET | Freq: Three times a day (TID) | ORAL | Status: DC
  Administered 2013-02-28: 650 mg via ORAL
  Filled 2013-02-28 (×4): qty 1

## 2013-02-28 NOTE — Progress Notes (Signed)
Co2 level 10, results called from lab. No change from yesterday. md paged with results.

## 2013-02-28 NOTE — Progress Notes (Signed)
Pt. very lethargic throughout night. Pt responded when spoken to. Pt had no appetite and did not eat dinner or a snack.

## 2013-02-28 NOTE — Progress Notes (Signed)
[facility] PROGRESS NOTE  [name] VZD:[id number] DOB: 10/03/1928 DOA: 02/24/2013 PCP: Illene Regulus, MD  Assessment/Plan:  1) Acute pancreatitis  - Lipase up from 200 to 600 today.  Will place on NPO and MIVF's - Denies any abdominal pain. No emesis reported - Most likely related to statin.  - Lipid panel reviewed and not likely cause of pancreatitis  2. AMS - likely due to dehydration and # 1 - not sure what baseline is at this moment   3. AKI on CKD - Continue to hold nephrotoxic agents - Improved with IVF administration as such likely prerenal - check creatinine level next am  4. Hypokalemia - Replace orally and reassess next am  5. HTN - Relatively well controlled on current regimen - will continue current regimen.  6. HPL - continue aspirin - will hold statin given # 1  7 IDDM - Continue SSI - monitor blood sugars.  8. Normal anion gap (hyperchloremic) metabolic acidoses - Most likely due to CKD - Will increase sodium bicarb to 650 mg po tid and reassess next am.  Code Status: DNR Family Communication: no family at bedside. Disposition Plan: Pending continued improvement in condition and advancement of diet with resolution of emesis/nausea   Consultants:  none  Procedures:  none  Antibiotics:  none   HPI/Subjective: No new complaints. Food tray sitting in room untouched  Objective: Filed Vitals:   02/27/13 1300 02/27/13 2002 02/28/13 0359 02/28/13 1315  BP: 125/68 131/54 119/49 117/42  Pulse: 62 53 53 51  Temp: 97.9 F (36.6 C) 98 F (36.7 C) 98.3 F (36.8 C) 98.7 F (37.1 C)  TempSrc: Oral Oral Oral Oral  Resp: 17 18 16 20   Height:      Weight:   84.6 kg (186 lb 8.2 oz)   SpO2: 98% 98% 100% 100%    Intake/Output Summary (Last 24 hours) at 02/28/13 1718 Last data filed at 02/28/13 1300  Gross per 24 hour  Intake 1140.83 ml  Output    475 ml  Net 665.83 ml   Filed Weights   02/25/13 0300 02/27/13 0647 02/28/13  0359  Weight: 78.926 kg (174 lb) 83.9 kg (184 lb 15.5 oz) 84.6 kg (186 lb 8.2 oz)    Exam:   General:  Pt in NAD, Alert and Awake  Cardiovascular: RRR, no MRG  Respiratory: CTA BL, no wheezes  Abdomen: soft, ND, NT  Musculoskeletal: no cyanosis or clubbing   Data Reviewed: Basic Metabolic Panel:  Recent Labs Lab 02/24/13 2156 02/25/13 0535 02/26/13 0434 02/27/13 1128 02/28/13 0650  NA 137 139 144 139 132*  K 2.7* 2.9* 2.8* 3.6 4.0  CL 101 106 115* 115* 109  CO2 14* 17* 14* 10* 10*  GLUCOSE 274* 153* 149* 176* 185*  BUN 68* 65* 53* 43* 44*  CREATININE 2.89* 2.44* 1.65* 1.35* 1.55*  CALCIUM 10.2 10.0 9.7 9.9 9.5  MG  --   --  1.9  --   --    Liver Function Tests:  Recent Labs Lab 02/24/13 2156 02/25/13 0535 02/27/13 1128  AST 17 21 22   ALT 8 9 12   ALKPHOS 149* 135* 99  BILITOT 0.4 0.4 0.3  PROT 7.4 7.2 6.0  ALBUMIN 3.6 3.3* 2.3*    Recent Labs Lab 02/24/13 2156 02/27/13 0520 02/28/13 0634  LIPASE 1370* 270* 620*   No results found for this basename: AMMONIA,  in the last 168 hours CBC:  Recent Labs Lab 02/24/13 2156 02/25/13 0535 02/26/13 0434  WBC 9.2 12.6* 9.6  NEUTROABS 8.2*  --   --   HGB 11.6* 10.9* 9.7*  HCT 34.9* 33.3* 30.0*  MCV 82.1 82.4 82.4  PLT PLATELET CLUMPS NOTED ON SMEAR, COUNT APPEARS ADEQUATE 145* PLATELET CLUMPS NOTED ON SMEAR, COUNT APPEARS ADEQUATE   Cardiac Enzymes:  Recent Labs Lab 02/24/13 2156  TROPONINI <0.30   BNP (last 3 results) No results found for this basename: PROBNP,  in the last 8760 hours CBG:  Recent Labs Lab 02/27/13 2353 02/28/13 0358 02/28/13 0759 02/28/13 1141 02/28/13 1638  GLUCAP 166* 145* 167* 168* 203*    Recent Results (from the past 240 hour(s))  CLOSTRIDIUM DIFFICILE BY PCR     Status: None   Collection Time    02/27/13  4:18 PM      Result Value Range Status   C difficile by pcr NEGATIVE  NEGATIVE Final     Studies: No results found.  Scheduled Meds: . amLODipine   5 mg Oral Daily  . aspirin EC  81 mg Oral Daily  . atropine  1 drop Left Eye BID  . bimatoprost  1 drop Left Eye Daily  . citalopram  20 mg Oral Daily  . dorzolamide-timolol  1 drop Both Eyes BID  . gabapentin  600 mg Oral TID  . heparin  5,000 Units Subcutaneous Q8H  . hydrALAZINE  100 mg Oral TID  . insulin aspart  0-15 Units Subcutaneous Q4H  . metoprolol tartrate  75 mg Oral BID  . multivitamin with minerals  1 tablet Oral Daily  . pantoprazole  40 mg Oral q morning - 10a  . sodium bicarbonate  650 mg Oral Daily  . sodium chloride  1 drop Left Eye BID  . sodium chloride  3 mL Intravenous Q12H   Continuous Infusions: . dextrose 5 % with KCl 20 mEq / L 20 mEq (02/28/13 1200)    Principal Problem:   Acute pancreatitis Active Problems:   IDDM   DYSLIPIDEMIA   Acute-on-chronic kidney injury   Hypokalemia   Altered mental status    Time spent: > 35 minutes    [name]  [facility] Pager [phone]. If 7PM-7AM, please contact night-coverage at [email or web address], password [facility] 02/28/2013, 5:18 PM  LOS: 4 days

## 2013-03-01 ENCOUNTER — Inpatient Hospital Stay (HOSPITAL_COMMUNITY): Payer: Medicare Other

## 2013-03-01 DIAGNOSIS — N182 Chronic kidney disease, stage 2 (mild): Secondary | ICD-10-CM

## 2013-03-01 LAB — GLUCOSE, CAPILLARY
Glucose-Capillary: 168 mg/dL — ABNORMAL HIGH (ref 70–99)
Glucose-Capillary: 148 mg/dL — ABNORMAL HIGH (ref 70–99)

## 2013-03-01 LAB — BLOOD GAS, ARTERIAL
Acid-base deficit: 12.7 mmol/L — ABNORMAL HIGH (ref 0.0–2.0)
Bicarbonate: 13.5 mEq/L — ABNORMAL LOW (ref 20.0–24.0)
TCO2: 13.1 mmol/L (ref 0–100)
pCO2 arterial: 33.6 mmHg — ABNORMAL LOW (ref 35.0–45.0)

## 2013-03-01 LAB — LIPASE, BLOOD: Lipase: 941 U/L — ABNORMAL HIGH (ref 11–59)

## 2013-03-01 MED ORDER — FUROSEMIDE 10 MG/ML IJ SOLN: 40.0000 mg | Freq: Two times a day (BID) | INTRAMUSCULAR | Status: DC

## 2013-03-01 MED ORDER — PANTOPRAZOLE SODIUM 40 MG IV SOLR
40.0000 mg | INTRAVENOUS | Status: DC
  Administered 2013-03-01 – 2013-03-04 (×4): 40 mg via INTRAVENOUS
  Filled 2013-03-01 (×5): qty 40

## 2013-03-01 MED ORDER — SODIUM BICARBONATE 8.4 % IV SOLN
INTRAVENOUS | Status: AC
  Administered 2013-03-01: 16:00:00 via INTRAVENOUS
  Filled 2013-03-01: qty 150

## 2013-03-01 MED ORDER — SODIUM CHLORIDE 0.9 % IJ SOLN: 3.0000 mL | INTRAMUSCULAR | Status: DC | PRN

## 2013-03-01 MED ORDER — SODIUM CHLORIDE 0.9 % IV SOLN: 250.0000 mL | INTRAVENOUS | Status: DC | PRN

## 2013-03-01 MED ORDER — FUROSEMIDE 10 MG/ML IJ SOLN
40.0000 mg | Freq: Two times a day (BID) | INTRAMUSCULAR | Status: DC
  Administered 2013-03-01 – 2013-03-03 (×5): 40 mg via INTRAVENOUS
  Filled 2013-03-01 (×6): qty 4

## 2013-03-01 MED ORDER — SODIUM CHLORIDE 0.9 % IJ SOLN
3.0000 mL | Freq: Two times a day (BID) | INTRAMUSCULAR | Status: DC
  Administered 2013-03-01 – 2013-03-04 (×5): 3 mL via INTRAVENOUS

## 2013-03-01 MED ORDER — HYDRALAZINE HCL 20 MG/ML IJ SOLN: 5.0000 mg | Freq: Four times a day (QID) | INTRAMUSCULAR | Status: DC | PRN

## 2013-03-01 NOTE — Progress Notes (Signed)
Pt was more edematous then last assessment and had of  urine output. Pt's lungs were clear but diminished. Pt's weight increased from 84.6-86.3kg. Dr. Cena Benton was notified and new orders were written. 40mg  of lassix were given and IV fluids were discontinued. Pt was also too lethargic to swallow medications that were crushed. MD was notified as no PO medications were given this morning.

## 2013-03-01 NOTE — Progress Notes (Addendum)
[facility] PROGRESS NOTE  [name] ZOX:[id number] DOB: Jul 10, 1928 DOA: 02/24/2013 PCP: [name] Regulus, MD  Assessment/Plan:  1) Acute pancreatitis  - Lipase up from 600 to 941  today.  Continue patient NPO. Fluid management is complicated in that patient is starting to show signs of becoming fluid overloaded.  As such will administer lasix 40 mg IV BID and hold IVF's for the morning. - Denies any abdominal pain. No emesis reported - Most likely related to statin.  - Lipid panel reviewed and not likely cause of pancreatitis  2. AMS - likely due to dehydration and # 1 - not sure what baseline is at this moment - Could also be secondary to metabolic acidosis  3. AKI on CKD - Continue to hold nephrotoxic agents - Improved with IVF administration initially thus felt to likely be prerenal - check creatinine level next am - Most likely contributing to Metabolic acidosis  4. Hypokalemia - Within normal limits after replacement.  5. HTN - Relatively well controlled on current regimen - will continue current regimen.  6. HPL - continue aspirin - will hold statin given # 1  7 IDDM - Continue SSI - monitor blood sugars.  8. Normal anion gap (hyperchloremic) metabolic acidoses - Most likely due to CKD - Unable to take oral medication.  Will hold off on IVF's until patient has improved urine output and later today will start D5 sodium chloride infusion with bicarb  Code Status: DNR Family Communication: no family at bedside. Disposition Plan: Pending continued improvement in condition and advancement of diet with resolution of emesis/nausea   Consultants:  none  Procedures:  none  Antibiotics:  none   HPI/Subjective: Patient is less conversant today.  Nursing reports that patient is more somnolent today.  Objective: Filed Vitals:   02/28/13 0359 02/28/13 1315 02/28/13 2025 03/01/13 0500  BP: 119/49 117/42 142/53 132/50  Pulse: 53 51 59 53  Temp:  98.3 F (36.8 C) 98.7 F (37.1 C) 98.5 F (36.9 C) 98.7 F (37.1 C)  TempSrc: Oral Oral Oral Oral  Resp: 16 20 20 16   Height:      Weight: 84.6 kg (186 lb 8.2 oz)   86.3 kg (190 lb 4.1 oz)  SpO2: 100% 100% 97% 97%    Intake/Output Summary (Last 24 hours) at 03/01/13 1401 Last data filed at 03/01/13 0507  Gross per 24 hour  Intake 1653.75 ml  Output    350 ml  Net 1303.75 ml   Filed Weights   02/27/13 0647 02/28/13 0359 03/01/13 0500  Weight: 83.9 kg (184 lb 15.5 oz) 84.6 kg (186 lb 8.2 oz) 86.3 kg (190 lb 4.1 oz)    Exam:   General:  Pt in NAD, somnolent but arousable and responsive.  Cardiovascular: RRR, no MRG  Respiratory: CTA BL, no wheezes  Abdomen: soft, ND, NT  Musculoskeletal: no cyanosis or clubbing   Data Reviewed: Basic Metabolic Panel:  Recent Labs Lab 02/24/13 2156 02/25/13 0535 02/26/13 0434 02/27/13 1128 02/28/13 0650  NA 137 139 144 139 132*  K 2.7* 2.9* 2.8* 3.6 4.0  CL 101 106 115* 115* 109  CO2 14* 17* 14* 10* 10*  GLUCOSE 274* 153* 149* 176* 185*  BUN 68* 65* 53* 43* 44*  CREATININE 2.89* 2.44* 1.65* 1.35* 1.55*  CALCIUM 10.2 10.0 9.7 9.9 9.5  MG  --   --  1.9  --   --    Liver Function Tests:  Recent Labs Lab 02/24/13 2156 02/25/13 0535  02/27/13 1128  AST 17 21 22   ALT 8 9 12   ALKPHOS 149* 135* 99  BILITOT 0.4 0.4 0.3  PROT 7.4 7.2 6.0  ALBUMIN 3.6 3.3* 2.3*    Recent Labs Lab 02/24/13 2156 02/27/13 0520 02/28/13 0634 03/01/13 0430  LIPASE 1370* 270* 620* 941*   No results found for this basename: AMMONIA,  in the last 168 hours CBC:  Recent Labs Lab 02/24/13 2156 02/25/13 0535 02/26/13 0434  WBC 9.2 12.6* 9.6  NEUTROABS 8.2*  --   --   HGB 11.6* 10.9* 9.7*  HCT 34.9* 33.3* 30.0*  MCV 82.1 82.4 82.4  PLT PLATELET CLUMPS NOTED ON SMEAR, COUNT APPEARS ADEQUATE 145* PLATELET CLUMPS NOTED ON SMEAR, COUNT APPEARS ADEQUATE   Cardiac Enzymes:  Recent Labs Lab 02/24/13 2156  TROPONINI <0.30   BNP (last  3 results) No results found for this basename: PROBNP,  in the last 8760 hours CBG:  Recent Labs Lab 02/28/13 0759 02/28/13 1141 02/28/13 1638 02/28/13 2022 02/28/13 2348  GLUCAP 167* 168* 203* 310* 263*    Recent Results (from the past 240 hour(s))  CLOSTRIDIUM DIFFICILE BY PCR     Status: None   Collection Time    02/27/13  4:18 PM      Result Value Range Status   C difficile by pcr NEGATIVE  NEGATIVE Final     Studies: Dg Chest Port 1 View  03/01/2013   *RADIOLOGY REPORT*  Clinical Data: Suspect fluid overload, history of congestive heart failure and hypertension  PORTABLE CHEST - 1 VIEW  Comparison: Chest x-ray of 02/24/2013  Findings: There is moderate cardiomegaly present with pulmonary vascular congestion and probable small effusions consistent with mild congestive heart failure.  No acute bony abnormality is seen.  IMPRESSION: Probable mild CHF.   Original Report Authenticated By: Dwyane Dee, M.D.    Scheduled Meds: . amLODipine  5 mg Oral Daily  . aspirin EC  81 mg Oral Daily  . atropine  1 drop Left Eye BID  . bimatoprost  1 drop Left Eye Daily  . citalopram  20 mg Oral Daily  . dorzolamide-timolol  1 drop Both Eyes BID  . furosemide  40 mg Intravenous BID  . gabapentin  600 mg Oral TID  . heparin  5,000 Units Subcutaneous Q8H  . hydrALAZINE  100 mg Oral TID  . insulin aspart  0-15 Units Subcutaneous Q4H  . metoprolol tartrate  75 mg Oral BID  . multivitamin with minerals  1 tablet Oral Daily  . pantoprazole  40 mg Oral q morning - 10a  . sodium bicarbonate  650 mg Oral TID  . sodium chloride  1 drop Left Eye BID  . sodium chloride  3 mL Intravenous Q12H   Continuous Infusions:    Principal Problem:   Acute pancreatitis Active Problems:   IDDM   DYSLIPIDEMIA   Acute-on-chronic kidney injury   Hypokalemia   Altered mental status   Metabolic acidosis    Time spent: > 35 minutes    [name]  [facility] Pager [phone]. If 7PM-7AM,  please contact night-coverage at [email or web address], password [facility] 03/01/2013, 2:01 PM  LOS: 5 days   Addendum:  Discussed case with nurse who reports that patient had > 450 cc of urine output after the lasix administration.    Given that patient is NPO and that she is starting to have signs of being over fluid as well as metabolic acidosis.  Will administer sodium bicarb infusion with D 5  at 75 cc/hr for the next 4 hours and then discontinue fluids.

## 2013-03-01 NOTE — Progress Notes (Signed)
PT Cancellation Note  Patient Details Name: [name] MRN: [id number] DOB: Nov 14, 1927   Cancelled Treatment:     Spoke with RN briefly-pt not appropriate for PT at this time. Will hold PT today and check back on tomorrow. Thanks.   Rebeca Alert, MPT Pager: [phone]

## 2013-03-02 LAB — BASIC METABOLIC PANEL
BUN: 39 mg/dL — ABNORMAL HIGH (ref 6–23)
GFR calc Af Amer: 42 mL/min — ABNORMAL LOW (ref >90)
GFR calc non Af Amer: 37 mL/min — ABNORMAL LOW (ref >90)
Potassium: 3.4 mEq/L — ABNORMAL LOW (ref 3.5–5.1)
Sodium: 138 mEq/L (ref 135–145)

## 2013-03-02 LAB — FOLATE: Folate: 10 ng/mL

## 2013-03-02 LAB — CBC
MCHC: 33 g/dL (ref 30.0–36.0)
RDW: 20.4 % — ABNORMAL HIGH (ref 11.5–15.5)

## 2013-03-02 LAB — VITAMIN B12: Vitamin B-12: 694 pg/mL (ref 211–911)

## 2013-03-02 LAB — RPR: RPR Ser Ql: NONREACTIVE

## 2013-03-02 LAB — GLUCOSE, CAPILLARY
Glucose-Capillary: 144 mg/dL — ABNORMAL HIGH (ref 70–99)
Glucose-Capillary: 166 mg/dL — ABNORMAL HIGH (ref 70–99)
Glucose-Capillary: 144 mg/dL — ABNORMAL HIGH (ref 70–99)

## 2013-03-02 LAB — LIPASE, BLOOD: Lipase: 663 U/L — ABNORMAL HIGH (ref 11–59)

## 2013-03-02 MED ORDER — POTASSIUM CHLORIDE CRYS ER 20 MEQ PO TBCR
40.0000 meq | EXTENDED_RELEASE_TABLET | Freq: Once | ORAL | Status: AC
  Administered 2013-03-02: 40 meq via ORAL
  Filled 2013-03-02: qty 2

## 2013-03-02 MED ORDER — SODIUM BICARBONATE 8.4 % IV SOLN
INTRAVENOUS | Status: AC
  Administered 2013-03-02: 10:00:00 via INTRAVENOUS
  Filled 2013-03-02: qty 150

## 2013-03-02 NOTE — Progress Notes (Signed)
Clinical Social Work  Per MD, patient is not ready to DC today. CSW called [facility] and updated SNF on DC plans. SNF asked to be kept informed but agreeable to accept patient back when medically stable. CSW will continue to follow.  Unk Lightning, LCSW (Coverage for [employer])

## 2013-03-02 NOTE — Progress Notes (Signed)
Physical Therapy Treatment Patient Details Name: [name] MRN: [id number] DOB: Mar 14, 1928 Today's Date: 03/02/2013 Time: 0865-7846 PT Time Calculation (min): 35 min  PT Assessment / Plan / Recommendation Comments on Treatment Session  Pt lethagic today, however able to respond with simple one word answers and cueing tactile and verbal to stay on task. Very slow and very slow to respon, with great retropulsion in sitting. Worked on sitting for 15 minutes with MAx a to modA with lateral leans and cues for hand reaching and pulling to gain neutral sitting balance . Able to do so for small interval 10 seconds at a time. Attempted sit to stand , but too unsafe for one person due to right back with poster lean. Did folllow commands for 10BLE knee extensions, buit needed assistance to pull feet under her to attempt sit to stand. Communicated difference , decline of pt to nurse today.     Follow Up Recommendations  SNF     Does the patient have the potential to tolerate intense rehabilitation     Barriers to Discharge        Equipment Recommendations       Recommendations for Other Services    Frequency Min 3X/week   Plan Discharge plan remains appropriate (SNF)    Precautions / Restrictions Precautions Precautions: Fall   Pertinent Vitals/Pain C/o pain in bottom when moving in bed scoot. Unable to rate. Pt did have foul odor from buttocks area with bed mobility. Shared this with nurse.     Mobility  Bed Mobility Bed Mobility: Supine to Sit Supine to Sit: 2: Max assist;With rails Sit to Supine: 2: Max assist;With rail Details for Bed Mobility Assistance: Pt initiated movment of LEs ony, but with retropulsion of rest of Upper body with increased posterior lean with movment towards EOB.  Transfers Transfers: Sit to Stand;Stand to [employer] Transfers Details for Transfer Assistance: attempted , however very difficult at EOB due to pt with posterior lean. Attempted after  trying to accomplish upright sitting after 10 minutes and attempt left off for sit to stand but pt less than 5% effort and unable to assist with one person. At this level she would need twop person assist with therapy and a lift with nursing.  Ambulation/Gait Ambulation/Gait Assistance:  (unable)    Exercises     PT Diagnosis:    PT Problem List:   PT Treatment Interventions:     PT Goals Acute Rehab PT Goals Potential to Achieve Goals: Fair PT Goal: Supine/Side to Sit - Progress: Progressing toward goal Pt will go Sit to Stand: with min assist PT Goal: Sit to Stand - Progress: Progressing toward goal PT Transfer Goal: Bed to Chair/Chair to Bed - Progress: Progressing toward goal  Visit Information  Last PT Received On: 03/02/13 Assistance Needed: +2    Subjective Data  Subjective: I am okay, I am tired   Cognition  Cognition Arousal/Alertness: Lethargic Behavior During Therapy: Flat affect Overall Cognitive Status: No family/caregiver present to determine baseline cognitive functioning    Balance     End of Session PT - End of Session Activity Tolerance: Patient limited by fatigue (and alert levels) Patient left: in bed;with call bell/phone within reach Nurse Communication: Mobility status;Need for lift equipment (she would need to be lifted to chair for safety today. )   GP     Elyssa Pendelton 03/02/2013, 12:09 PM Marella Bile, PT Pager: [phone] 03/02/2013

## 2013-03-02 NOTE — Progress Notes (Signed)
[facility] PROGRESS NOTE  [name] LOV:[id number] DOB: Sep 28, 1928 DOA: 02/24/2013 PCP: Illene Regulus, MD  Brief HPI: 79 y/o with h/o suspected Dementia, DM, HTN that presented to the hospital at the request of her son due to decreasing appetite, decreasing mobility, increasing generalized weakness and confusion.  Initially patient presented with elevated lipase and we have been treating for pancreatitis.  During her admission she was made npo and given IVF's and diet was advanced.  But after patient ate her lipase came back up.  As such patient has been made NPO.  Fluid rehydration has been challenging in that patient has chronic kidney disease and has been getting fluid overloaded.   Assessment/Plan:  1) Acute pancreatitis  - Lipase up from 600 to 941 yesterday 03/01/13 but has come down after patient has been made NPO.  Continue patient NPO. Fluid management is complicated in that patient is starting to show signs of becoming fluid overloaded.  Has been responding well to lasix however and currently has negative fluid balance - Denies any abdominal pain. No emesis reported - Most likely related to statin. As such statin discontinued - Lipid panel reviewed and not likely cause of pancreatitis  2. AMS - likely due to dehydration and # 1 and to some degree suspect dementia at this point. - not sure what baseline is at this moment no family at bedside - Could also be secondary to metabolic acidosis  3. AKI on CKD - Continue to hold nephrotoxic agents - Most likely cause of Metabolic acidosis - Will replace bicarb via IVF's given that patient has been having trouble taking her po medications.  Will only provide 6 hours of IVF rehydration given that patient has been showing signs of being fluid overloaded. - Responding to lasix well and has had significant urine output on this regimen.  May consider decreasing lasix next am 03/03/13  4. Hypokalemia - Likely due to poor oral  intake and recent lasix administration - replace orally and recheck next am.  5. HTN - Relatively well controlled on current regimen - will continue current regimen.  6. HPL - continue aspirin - will hold statin given # 1  7 IDDM - Continue SSI - monitor blood sugars.  8. Normal anion gap (hyperchloremic) metabolic acidoses - Most likely due to CKD - Gently hydrating with D 5 sodium bicarb solution which since was started patient's bicarb has improved and she is more alert this am.  Code Status: DNR Family Communication: no family at bedside. Disposition Plan: Pending continued improvement in condition and advancement of diet with resolution of emesis/nausea   Consultants:  none  Procedures:  none  Antibiotics:  none   HPI/Subjective: Patient is more alert today than yesterday 5/19.  No acute issues reported overnight.  Has had significant urine output on lasix dose.  Objective: Filed Vitals:   03/01/13 2119 03/02/13 0537 03/02/13 0552 03/02/13 1342  BP: 145/77  127/60 115/69  Pulse: 58  75 63  Temp: 98.4 F (36.9 C)  99.2 F (37.3 C) 98.5 F (36.9 C)  TempSrc: Oral  Oral Oral  Resp: 18  16 18   Height:      Weight:  81.693 kg (180 lb 1.6 oz)    SpO2: 98%  98% 98%    Intake/Output Summary (Last 24 hours) at 03/02/13 1422 Last data filed at 03/02/13 1100  Gross per 24 hour  Intake      0 ml  Output   4750 ml  Net  -  4750 ml   Filed Weights   02/28/13 0359 03/01/13 0500 03/02/13 0537  Weight: 84.6 kg (186 lb 8.2 oz) 86.3 kg (190 lb 4.1 oz) 81.693 kg (180 lb 1.6 oz)    Exam:   General:  Pt in NAD, Alert and Awake and oriented only per person  Cardiovascular: RRR, no MRG  Respiratory: CTA BL, no wheezes  Abdomen: soft, ND, NT  Musculoskeletal: no cyanosis or clubbing   Skin: Peripheral edema  Data Reviewed: Basic Metabolic Panel:  Recent Labs Lab 02/25/13 0535 02/26/13 0434 02/27/13 1128 02/28/13 0650 03/02/13 0420  NA 139 144 139  132* 138  K 2.9* 2.8* 3.6 4.0 3.4*  CL 106 115* 115* 109 109  CO2 17* 14* 10* 10* 17*  GLUCOSE 153* 149* 176* 185* 165*  BUN 65* 53* 43* 44* 39*  CREATININE 2.44* 1.65* 1.35* 1.55* 1.31*  CALCIUM 10.0 9.7 9.9 9.5 9.5  MG  --  1.9  --   --   --    Liver Function Tests:  Recent Labs Lab 02/24/13 2156 02/25/13 0535 02/27/13 1128  AST 17 21 22   ALT 8 9 12   ALKPHOS 149* 135* 99  BILITOT 0.4 0.4 0.3  PROT 7.4 7.2 6.0  ALBUMIN 3.6 3.3* 2.3*    Recent Labs Lab 02/24/13 2156 02/27/13 0520 02/28/13 0634 03/01/13 0430 03/02/13 0420  LIPASE 1370* 270* 620* 941* 663*   No results found for this basename: AMMONIA,  in the last 168 hours CBC:  Recent Labs Lab 02/24/13 2156 02/25/13 0535 02/26/13 0434 03/02/13 0420  WBC 9.2 12.6* 9.6 9.3  NEUTROABS 8.2*  --   --   --   HGB 11.6* 10.9* 9.7* 9.7*  HCT 34.9* 33.3* 30.0* 29.4*  MCV 82.1 82.4 82.4 81.2  PLT PLATELET CLUMPS NOTED ON SMEAR, COUNT APPEARS ADEQUATE 145* PLATELET CLUMPS NOTED ON SMEAR, COUNT APPEARS ADEQUATE 142*   Cardiac Enzymes:  Recent Labs Lab 02/24/13 2156  TROPONINI <0.30   BNP (last 3 results) No results found for this basename: PROBNP,  in the last 8760 hours CBG:  Recent Labs Lab 03/01/13 2049 03/01/13 2347 03/02/13 0347 03/02/13 0730 03/02/13 1037  GLUCAP 186* 166* 144* 180* 144*    Recent Results (from the past 240 hour(s))  CLOSTRIDIUM DIFFICILE BY PCR     Status: None   Collection Time    02/27/13  4:18 PM      Result Value Range Status   C difficile by pcr NEGATIVE  NEGATIVE Final     Studies: Dg Chest Port 1 View  03/01/2013   *RADIOLOGY REPORT*  Clinical Data: Suspect fluid overload, history of congestive heart failure and hypertension  PORTABLE CHEST - 1 VIEW  Comparison: Chest x-ray of 02/24/2013  Findings: There is moderate cardiomegaly present with pulmonary vascular congestion and probable small effusions consistent with mild congestive heart failure.  No acute bony  abnormality is seen.  IMPRESSION: Probable mild CHF.   Original Report Authenticated By: Dwyane Dee, M.D.    Scheduled Meds: . amLODipine  5 mg Oral Daily  . aspirin EC  81 mg Oral Daily  . atropine  1 drop Left Eye BID  . bimatoprost  1 drop Left Eye Daily  . citalopram  20 mg Oral Daily  . dorzolamide-timolol  1 drop Both Eyes BID  . furosemide  40 mg Intravenous BID  . gabapentin  600 mg Oral TID  . heparin  5,000 Units Subcutaneous Q8H  . hydrALAZINE  100 mg Oral  TID  . insulin aspart  0-15 Units Subcutaneous Q4H  . metoprolol tartrate  75 mg Oral BID  . multivitamin with minerals  1 tablet Oral Daily  . pantoprazole (PROTONIX) IV  40 mg Intravenous Q24H  . potassium chloride  40 mEq Oral Once  . sodium chloride  1 drop Left Eye BID  . sodium chloride  3 mL Intravenous Q12H  . sodium chloride  3 mL Intravenous Q12H   Continuous Infusions: .  sodium bicarbonate  infusion 1000 mL 75 mL/hr at 03/02/13 1029    Principal Problem:   Acute pancreatitis Active Problems:   IDDM   DYSLIPIDEMIA   Acute-on-chronic kidney injury   Hypokalemia   Altered mental status   Metabolic acidosis    Time spent: > 35 minutes    [name]  [facility] Pager [phone]. If 7PM-7AM, please contact night-coverage at [email or web address], password [facility] 03/02/2013, 2:22 PM  LOS: 6 days

## 2013-03-03 ENCOUNTER — Inpatient Hospital Stay (HOSPITAL_COMMUNITY): Payer: Medicare Other

## 2013-03-03 DIAGNOSIS — E872 Acidosis: Secondary | ICD-10-CM

## 2013-03-03 DIAGNOSIS — L8990 Pressure ulcer of unspecified site, unspecified stage: Secondary | ICD-10-CM

## 2013-03-03 DIAGNOSIS — K610 Anal abscess: Secondary | ICD-10-CM | POA: Clinically undetermined

## 2013-03-03 DIAGNOSIS — L89309 Pressure ulcer of unspecified buttock, unspecified stage: Secondary | ICD-10-CM

## 2013-03-03 DIAGNOSIS — K612 Anorectal abscess: Secondary | ICD-10-CM

## 2013-03-03 LAB — BASIC METABOLIC PANEL
BUN: 33 mg/dL — ABNORMAL HIGH (ref 6–23)
CO2: 25 mEq/L (ref 19–32)
Chloride: 106 mEq/L (ref 96–112)
Creatinine, Ser: 1.17 mg/dL — ABNORMAL HIGH (ref 0.50–1.10)
Glucose, Bld: 158 mg/dL — ABNORMAL HIGH (ref 70–99)

## 2013-03-03 LAB — GLUCOSE, CAPILLARY
Glucose-Capillary: 155 mg/dL — ABNORMAL HIGH (ref 70–99)
Glucose-Capillary: 131 mg/dL — ABNORMAL HIGH (ref 70–99)
Glucose-Capillary: 151 mg/dL — ABNORMAL HIGH (ref 70–99)

## 2013-03-03 LAB — MAGNESIUM: Magnesium: 1.5 mg/dL (ref 1.5–2.5)

## 2013-03-03 MED ORDER — VANCOMYCIN HCL IN DEXTROSE 750-5 MG/150ML-% IV SOLN
750.0000 mg | INTRAVENOUS | Status: DC
  Administered 2013-03-04: 750 mg via INTRAVENOUS
  Filled 2013-03-03: qty 150

## 2013-03-03 MED ORDER — PIPERACILLIN-TAZOBACTAM 3.375 G IVPB
3.3750 g | Freq: Three times a day (TID) | INTRAVENOUS | Status: DC
  Administered 2013-03-03 – 2013-03-05 (×5): 3.375 g via INTRAVENOUS
  Filled 2013-03-03 (×6): qty 50

## 2013-03-03 MED ORDER — POTASSIUM CHLORIDE CRYS ER 20 MEQ PO TBCR
40.0000 meq | EXTENDED_RELEASE_TABLET | Freq: Two times a day (BID) | ORAL | Status: AC
  Administered 2013-03-03 (×2): 40 meq via ORAL
  Filled 2013-03-03 (×2): qty 2

## 2013-03-03 MED ORDER — VANCOMYCIN HCL IN DEXTROSE 1-5 GM/200ML-% IV SOLN
1000.0000 mg | INTRAVENOUS | Status: DC
  Filled 2013-03-03: qty 200

## 2013-03-03 MED ORDER — VANCOMYCIN HCL IN DEXTROSE 1-5 GM/200ML-% IV SOLN
1000.0000 mg | Freq: Once | INTRAVENOUS | Status: AC
  Administered 2013-03-03: 1000 mg via INTRAVENOUS
  Filled 2013-03-03: qty 200

## 2013-03-03 MED ORDER — FUROSEMIDE 40 MG PO TABS
40.0000 mg | ORAL_TABLET | Freq: Every day | ORAL | Status: DC
  Administered 2013-03-04: 40 mg via ORAL
  Filled 2013-03-03 (×2): qty 1

## 2013-03-03 MED ORDER — MAGNESIUM SULFATE 40 MG/ML IJ SOLN
2.0000 g | Freq: Once | INTRAMUSCULAR | Status: AC
  Administered 2013-03-03: 2 g via INTRAVENOUS
  Filled 2013-03-03: qty 50

## 2013-03-03 NOTE — Clinical Documentation Improvement (Signed)
CHANGE MENTAL STATUS DOCUMENTATION CLARIFICATION   THIS DOCUMENT IS NOT A PERMANENT PART OF THE MEDICAL RECORD  TO RESPOND TO THE THIS QUERY, FOLLOW THE INSTRUCTIONS BELOW:  1. If needed, update documentation for the patient's encounter via the notes activity.  2. Access this query again and click edit on the In [employer].  3. After updating, or not, click F2 to complete all highlighted (required) fields concerning your review. Select "additional documentation in the medical record" OR "no additional documentation provided".  4. Click Sign note button.  5. The deficiency will fall out of your In Basket *Please let us know if you are not able to complete this workflow by phone or e-mail (listed below).         03/03/13  Dear Dr. Thedore Mins Marton Redwood  In an effort to better capture your patient's severity of illness, reflect appropriate length of stay and utilization of resources, a review of the patient medical record has revealed the following indicators.    Based on your clinical judgment, please clarify and document in a progress note and/or discharge summary the clinical condition associated with the following supporting information:  In responding to this query please exercise your independent judgment.  The fact that a query is asked, does not imply that any particular answer is desired or expected.  Possible Clinical Conditions?  _______Encephalopathy (describe type if known)                       Anoxic                       Septic                       Alcoholic                        Hepatic                       Hypertensive                       Metabolic                       _______ Toxic   _______Drug induced confusion/delirium _______Acute confusion _______Acute delirium _______Acute exacerbation of known dementia (indicate type) _______New diagnosis of Dementia, Alzheimer's, cerebral atherosclerosis _  _______Hyponatremia / Hypernatremia   _______Hypoxemia /  Hypoxia _______Other Condition__________________ _______Cannot Clinically Determine   Supporting Information:  Risk Factors: DM, CHF, HTN, Acute pancreatitis, AKI     Signs & Symptoms:  ED:"decreasing appetite, decreasing mobility, increasing generalized weakness and confusion. not really able to provide much additional history due to AMS.Psychiatric: cannot assess due to delerium Neurologic: Appears delirious, alert, answering a few questions but not really oriented" clinically appears to be delirium from issue #1  Progress notes: 02/26/13>AMS  - likely due to dehydration and # 1  - not sure what baseline is at this moment  02/27/13:Called about CO2 level of 10  Anion gap is 14  At this point suspecting Hyperchloremic normal anion gap acidosis. Will change IVF's and reassess next am. Called nursing and updated with new plans.  02/28/13 Nsg: "very lethargic throughout night" 03/01/13 Nsg: "too lethargic to swallow medications that were crushed" Progress notes MD 03/01/13 :"AMS.Marland KitchenMarland KitchenCould also be secondary to metabolic acidosis"  Treatment: 03/01/13  per MD orders "  PO meds on hold" PT cancelled treatment on  03/01/13  Will check back on 03/02/13 IV hydration Reviewed: additional documentation in the medical record  Thank You,  Andy Gauss RN, BSN  Clinical Documentation Specialist:  Pager [phone]  Office [phone] [facility]  [facility] [place] [place] Information Management [place]

## 2013-03-03 NOTE — Consult Note (Signed)
Reason for Consult: stage IV decubitus ulcer Referring Physician: Dr. [name] is an 79 y.o. female being evaluated for a decubitus ulcer and possible debridement.  HPI: She is a 79 year old AAF with a past medical history of diabetes mellitus, CHF, CAD, hypertension, acute on chronic renal failure, GERD, anemia and cholecystectomy who was admitted to [facility] on 02/24/13 for decreasing appetite, N/V, diarrhea, weakness and confusion.  She was found to have acute pancreatitis, hypokalemia, acute on chronic kidney injury and mental status changes.  Treatment included aggressive hydration, NPO.  She seems to be improving from the pancreatitis.   WOC evaluated the patient today and noted eschar on the right buttocks pressure ulcer as well as an abscess on the left perianal region that spontaneously began draining purulent discharge.  The patient does not know the duration of the ulcers.  She is a poor historian and therefore the history of present illness is limited.  She apparently lives at home.  She has a son who is a POA and involved with her care.  The patient was able to tell me that she has had pain in this region before entering the hospital.  She denies fever or chills.     Past Medical History  Diagnosis Date  . Coronary artery disease   . Renal artery stenosis   . Hypertension   . Dyslipidemia   . GERD (gastroesophageal reflux disease)   . Tension headache   . Abdominal pain   . Anemia, unspecified   . Gout   . Back pain, chronic   . History of tonsillectomy   . History of cholecystectomy   . Depression   . IDDM (insulin dependent diabetes mellitus)   . Diabetes mellitus   . CHF (congestive heart failure)     Past Surgical History  Procedure Laterality Date  . Tonsillectomy    . Cholecystectomy  40 years ago    Family History  Problem Relation Age of Onset  . Coronary artery disease Father   . Hypertension Father   . Heart attack Father   .  Hyperlipidemia Father   . Diabetes Sister   . Hypertension Sister   . Hyperlipidemia Sister   . Hypertension Brother   . Diabetes Brother   . Hyperlipidemia Brother   . Cancer Neg Hx     Breast and colon    Social History:  reports that she has never smoked. She does not have any smokeless tobacco history on file. She reports that she does not drink alcohol or use illicit drugs.  Allergies: No Known Allergies  Medications: I have reviewed the patient's current medications.  Results for orders placed during the hospital encounter of 02/24/13 (from the past 48 hour(s))  GLUCOSE, CAPILLARY     Status: Abnormal   Collection Time    03/01/13  8:49 PM      Result Value Range   Glucose-Capillary 186 (*) 70 - 99 mg/dL   Comment 1 Notify RN     Comment 2 Documented in Chart    GLUCOSE, CAPILLARY     Status: Abnormal   Collection Time    03/01/13 11:47 PM      Result Value Range   Glucose-Capillary 166 (*) 70 - 99 mg/dL   Comment 1 Notify RN     Comment 2 Documented in Chart    GLUCOSE, CAPILLARY     Status: Abnormal   Collection Time    03/02/13  3:47 AM  Result Value Range   Glucose-Capillary 144 (*) 70 - 99 mg/dL   Comment 1 Notify RN     Comment 2 Documented in Chart    LIPASE, BLOOD     Status: Abnormal   Collection Time    03/02/13  4:20 AM      Result Value Range   Lipase 663 (*) 11 - 59 U/L  CBC     Status: Abnormal   Collection Time    03/02/13  4:20 AM      Result Value Range   WBC 9.3  4.0 - 10.5 K/uL   RBC 3.62 (*) 3.87 - 5.11 MIL/uL   Hemoglobin 9.7 (*) 12.0 - 15.0 g/dL   HCT 16.1 (*) 09.6 - 04.5 %   MCV 81.2  78.0 - 100.0 fL   MCH 26.8  26.0 - 34.0 pg   MCHC 33.0  30.0 - 36.0 g/dL   RDW 40.9 (*) 81.1 - 91.4 %   Platelets 142 (*) 150 - 400 K/uL  BASIC METABOLIC PANEL     Status: Abnormal   Collection Time    03/02/13  4:20 AM      Result Value Range   Sodium 138  135 - 145 mEq/L   Potassium 3.4 (*) 3.5 - 5.1 mEq/L   Chloride 109  96 - 112 mEq/L    CO2 17 (*) 19 - 32 mEq/L   Glucose, Bld 165 (*) 70 - 99 mg/dL   BUN 39 (*) 6 - 23 mg/dL   Creatinine, Ser 7.82 (*) 0.50 - 1.10 mg/dL   Calcium 9.5  8.4 - 95.6 mg/dL   GFR calc non Af Amer 37 (*) >90 mL/min   GFR calc Af Amer 42 (*) >90 mL/min   Comment:            The eGFR has been calculated     using the CKD EPI equation.     This calculation has not been     validated in all clinical     situations.     eGFR's persistently     <90 mL/min signify     possible Chronic Kidney Disease.  GLUCOSE, CAPILLARY     Status: Abnormal   Collection Time    03/02/13  7:30 AM      Result Value Range   Glucose-Capillary 180 (*) 70 - 99 mg/dL   Comment 1 Documented in Chart     Comment 2 Notify RN    GLUCOSE, CAPILLARY     Status: Abnormal   Collection Time    03/02/13 10:37 AM      Result Value Range   Glucose-Capillary 144 (*) 70 - 99 mg/dL   Comment 1 Documented in Chart     Comment 2 Notify RN    VITAMIN B12     Status: None   Collection Time    03/02/13  4:27 PM      Result Value Range   Vitamin B-12 694  211 - 911 pg/mL  FOLATE     Status: None   Collection Time    03/02/13  4:27 PM      Result Value Range   Folate 10.0     Comment: (NOTE)     Reference Ranges            Deficient:       0.4 - 3.3 ng/mL            Indeterminate:   3.4 - 5.4 ng/mL  Normal:              > 5.4 ng/mL  RPR     Status: None   Collection Time    03/02/13  4:27 PM      Result Value Range   RPR NON REACTIVE  NON REACTIVE  GLUCOSE, CAPILLARY     Status: Abnormal   Collection Time    03/02/13  4:40 PM      Result Value Range   Glucose-Capillary 190 (*) 70 - 99 mg/dL   Comment 1 Documented in Chart     Comment 2 Notify RN    WOUND CULTURE     Status: None   Collection Time    03/02/13  4:54 PM      Result Value Range   Specimen Description SACRAL     Special Requests Immunocompromised     Gram Stain       Value: ABUNDANT WBC PRESENT, PREDOMINANTLY PMN     NO SQUAMOUS EPITHELIAL  CELLS SEEN     MODERATE GRAM POSITIVE COCCI     IN CLUSTERS FEW GRAM NEGATIVE RODS   Culture Culture reincubated for better growth     Report Status PENDING    GLUCOSE, CAPILLARY     Status: Abnormal   Collection Time    03/02/13  8:24 PM      Result Value Range   Glucose-Capillary 205 (*) 70 - 99 mg/dL  GLUCOSE, CAPILLARY     Status: Abnormal   Collection Time    03/02/13 11:53 PM      Result Value Range   Glucose-Capillary 162 (*) 70 - 99 mg/dL  GLUCOSE, CAPILLARY     Status: Abnormal   Collection Time    03/03/13  4:10 AM      Result Value Range   Glucose-Capillary 155 (*) 70 - 99 mg/dL  LIPASE, BLOOD     Status: Abnormal   Collection Time    03/03/13  4:55 AM      Result Value Range   Lipase 656 (*) 11 - 59 U/L  BASIC METABOLIC PANEL     Status: Abnormal   Collection Time    03/03/13  4:55 AM      Result Value Range   Sodium 139  135 - 145 mEq/L   Potassium 3.1 (*) 3.5 - 5.1 mEq/L   Chloride 106  96 - 112 mEq/L   CO2 25  19 - 32 mEq/L   Glucose, Bld 158 (*) 70 - 99 mg/dL   BUN 33 (*) 6 - 23 mg/dL   Creatinine, Ser 1.61 (*) 0.50 - 1.10 mg/dL   Calcium 9.5  8.4 - 09.6 mg/dL   GFR calc non Af Amer 42 (*) >90 mL/min   GFR calc Af Amer 49 (*) >90 mL/min   Comment:            The eGFR has been calculated     using the CKD EPI equation.     This calculation has not been     validated in all clinical     situations.     eGFR's persistently     <90 mL/min signify     possible Chronic Kidney Disease.  MAGNESIUM     Status: None   Collection Time    03/03/13  4:55 AM      Result Value Range   Magnesium 1.5  1.5 - 2.5 mg/dL  GLUCOSE, CAPILLARY     Status: Abnormal   Collection Time  03/03/13  7:36 AM      Result Value Range   Glucose-Capillary 131 (*) 70 - 99 mg/dL  GLUCOSE, CAPILLARY     Status: Abnormal   Collection Time    03/03/13 12:24 PM      Result Value Range   Glucose-Capillary 151 (*) 70 - 99 mg/dL    US Abdomen Complete  03/03/2013   *RADIOLOGY  REPORT*  Clinical Data:  Pancreatitis.  ABDOMINAL ULTRASOUND COMPLETE  Comparison:  02/24/2013  Findings:  Gallbladder:  Previous cholecystectomy  Common Bile Duct:  Within normal limits in caliber.  Liver: No focal mass lesion identified.  Within normal limits in parenchymal echogenicity.  IVC:  Appears normal.  Pancreas:  Diminished exam detail.No abnormality identified.  Spleen:  Within normal limits in size and echotexture.  Right kidney:  Irregular and lobular contour measuring 9.4 cm. Normal in parenchymal echogenicity.  No evidence of mass or hydronephrosis.  Left kidney:  Irregular and lobular contour measuring 9.9 cm. Normal in parenchymal echogenicity.  No evidence of mass or hydronephrosis.  Abdominal Aorta:  No aneurysm identified.  IMPRESSION:  1.  No acute findings. 2.  Prior cholecystectomy.   Original Report Authenticated By: Signa Kell, M.D.    Review of Systems  Constitutional: Negative for fever, chills and diaphoresis.  Respiratory: Negative for shortness of breath and wheezing.   Cardiovascular: Negative for chest pain and palpitations.  Gastrointestinal: Negative for nausea, vomiting and abdominal pain.  Genitourinary: Negative for hematuria and flank pain.       Foley   Blood pressure 110/49, pulse 66, temperature 97.7 F (36.5 C), temperature source Oral, resp. rate 18, height 5\' 3"  (1.6 m), weight 178 lb 12.7 oz (81.1 kg), SpO2 97.00%. Physical Exam  Constitutional: She appears well-developed and well-nourished. No distress.  HENT:  Head: Normocephalic and atraumatic.  Mouth/Throat: No oropharyngeal exudate.  Dry mucus membranes  Neck: No JVD present.  Cardiovascular: Normal rate and regular rhythm.  Exam reveals no gallop.   No murmur heard. Respiratory: Effort normal and breath sounds normal. No respiratory distress. She has no wheezes.  GI: Soft. Bowel sounds are normal. She exhibits no distension. There is no tenderness.  Lymphadenopathy:    She has no  cervical adenopathy.  Neurological:  Alert and oriented to person and place  Skin: She is not diaphoretic.  Right buttocks- 3x2cm open area, echar.  No tunneling, no drainage. Left perianal area-approx 5x3cm area of induration, fluctuant in the center, able to express purulent white/yellow discharge    Assessment/Plan:  Decubitus Ulcer -I&D tomorrow morning at bedside.   -obtain consent -Nursing to supply I&D kit as well as lidocaine -Hold heparin in the morning -NS wet to dry dressing changes BID -optimize nutrition as well as mobility/q2h turns  Perianal Abscess -I&D tomorrow morning.   -Wound culture sent -Agree with vanc and zosyn   Bonner Puna [facility] ANP-BC Pager [phone] 03/03/2013, 4:30 PM

## 2013-03-03 NOTE — Progress Notes (Addendum)
ANTIBIOTIC CONSULT NOTE - INITIAL  Pharmacy Consult for:  Zosyn, Vancomycin Indication:  Decubitus ulcer  No Known Allergies  Patient Measurements: Height: 5\' 3"  (160 cm) Weight: 178 lb 12.7 oz (81.1 kg) IBW/kg (Calculated) : 52.4   Vital Signs: Temp: 97.7 F (36.5 C) (05/21 1527) Temp src: Oral (05/21 1527) BP: 110/49 mmHg (05/21 1527) Pulse Rate: 66 (05/21 1527) Intake/Output from previous day: 05/20 0701 - 05/21 0700 In: 1087.5 [I.V.:1087.5] Out: 3850 [Urine:3850] Intake/Output from this shift: Total I/O In: 0  Out: 1300 [Urine:1300]  Labs:  Recent Labs  03/02/13 0420 03/03/13 0455  WBC 9.3  --   HGB 9.7*  --   PLT 142*  --   CREATININE 1.31* 1.17*   Estimated Creatinine Clearance: 36.8 ml/min (by C-G formula based on Cr of 1.17).    Microbiology: Recent Results (from the past 720 hour(s))  CLOSTRIDIUM DIFFICILE BY PCR     Status: None   Collection Time    02/27/13  4:18 PM      Result Value Range Status   C difficile by pcr NEGATIVE  NEGATIVE Final  WOUND CULTURE     Status: None   Collection Time    03/02/13  4:54 PM      Result Value Range Status   Specimen Description SACRAL   Final   Special Requests Immunocompromised   Final   Gram Stain     Final   Value: ABUNDANT WBC PRESENT, PREDOMINANTLY PMN     NO SQUAMOUS EPITHELIAL CELLS SEEN     MODERATE GRAM POSITIVE COCCI     IN CLUSTERS FEW GRAM NEGATIVE RODS   Culture Culture reincubated for better growth   Final   Report Status PENDING   Incomplete    Medical History: Past Medical History  Diagnosis Date  . Coronary artery disease   . Renal artery stenosis   . Hypertension   . Dyslipidemia   . GERD (gastroesophageal reflux disease)   . Tension headache   . Abdominal pain   . Anemia, unspecified   . Gout   . Back pain, chronic   . History of tonsillectomy   . History of cholecystectomy   . Depression   . IDDM (insulin dependent diabetes mellitus)   . Diabetes mellitus   . CHF  (congestive heart failure)     Medications:  Scheduled:  . amLODipine  5 mg Oral Daily  . aspirin EC  81 mg Oral Daily  . atropine  1 drop Left Eye BID  . bimatoprost  1 drop Left Eye Daily  . citalopram  20 mg Oral Daily  . dorzolamide-timolol  1 drop Both Eyes BID  . furosemide  40 mg Oral Daily  . gabapentin  600 mg Oral TID  . heparin  5,000 Units Subcutaneous Q8H  . hydrALAZINE  100 mg Oral TID  . insulin aspart  0-15 Units Subcutaneous Q4H  . metoprolol tartrate  75 mg Oral BID  . multivitamin with minerals  1 tablet Oral Daily  . pantoprazole (PROTONIX) IV  40 mg Intravenous Q24H  . piperacillin-tazobactam (ZOSYN)  IV  3.375 g Intravenous Q8H  . potassium chloride  40 mEq Oral BID  . sodium chloride  1 drop Left Eye BID  . sodium chloride  3 mL Intravenous Q12H  . sodium chloride  3 mL Intravenous Q12H  . vancomycin  1,000 mg Intravenous Q24H   Assessment:  Asked to assist with Vancomycin and Zosyn therapy for this 79 year-old female  with decubitus ulcer and boil-like lesion to buttocks .  Wound culture report indicates Gram stain with moderate Gram (+) cocci in clusters and few Gram negative rods.  Goal of Therapy:   Vancomycin trough levels 10-15 mcg/ml  Eradication of infection  Plan:   Vancomycin 1000 mg as the initial dose, then 750 mg IV every 24 hours  Zosyn 3.375 grams IV every 8 hours, each dose infused over 4 hours.  Vancomycin levels as needed to guide dosing   [employer] R.Ph. 03/03/2013 4:37 PM    ,

## 2013-03-03 NOTE — Evaluation (Signed)
SLP Cancellation Note  Patient Details Name: [name] MRN: [id number] DOB: 06/23/1928   Cancelled treatment:       Reason Eval/Treat Not Completed: Patient at procedure or test/unavailable.  Will re-attempt at another time.   Spoke to RN who reports pt tolerating po at this time but off the floor at ultrasound.    Donavan Burnet, MS [facility] SLP [phone]

## 2013-03-03 NOTE — Progress Notes (Addendum)
[facility]                                                                                Patient Demographics  [name], is a 79 y.o. female, DOB - [birth year], ZOX:[id number], [id number]:[id number]  Admit date - 02/24/2013  Admitting Physician Hillary Bow, DO  Outpatient Primary MD for the patient is Illene Regulus, MD  LOS - 7   No chief complaint on file.       Assessment & Plan    Brief HPI:    79 y/o with h/o suspected Dementia, DM, HTN that presented to the hospital at the request of her son due to decreasing appetite, decreasing mobility, increasing generalized weakness and confusion. Initially patient presented with elevated lipase and we have been treating for pancreatitis. During her admission she was made npo and given IVF's and diet was advanced. But after patient ate her lipase came back up. As such patient has been made NPO. Fluid rehydration has been challenging in that patient has chronic kidney disease and has been getting fluid overloaded.     Assessment/Plan:    1) Acute pancreatitis  - Lipase is trending down, she is pain-free, we'll start him on clear diet, monitor clinically.  - Pancreatitis could be likely related to statin. As such statin discontinued  - Lipid panel reviewed and not likely cause of pancreatitis, initial CT scan was unremarkable, we'll repeat abdominal ultrasound    2. AMS  - likely due to dehydration and # 1 and to some degree suspect dementia at this point.  - not sure what baseline is at this moment no family at bedside  - Could also be secondary to metabolic acidosis, much improved on 03/03/2013 secondary to supportive care. Continue to monitor.    3. AKI on CKD  - Continue to hold nephrotoxic agents  - Most likely cause of Metabolic acidosis causing Metabolic Encephalopathy -She responded to diuresis and creatinine is improved, her metabolic acidosis has resolved, received IV bicarbonate for 4 hours on 03/02/2013,  will repeat electrolytes in the morning and monitor the trend.    4. Hypokalemia  - Likely due to poor oral intake and recent lasix administration  - replaced orally and recheck next am, magnesium was borderline and she will get 2 g of IV magnesium on 03/04/2039    5. HTN  - Relatively well controlled on current regimen  - will continue current regimen.     6. HPL  - continue aspirin  - will hold statin given # 1     7 IDDM  - Continue SSI  - monitor blood sugars.   CBG (last 3)   Recent Labs  03/02/13 2353 03/03/13 0410 03/03/13 0736  GLUCAP 162* 155* 131*      8. Normal anion gap (hyperchloremic) metabolic acidoses  - Most likely due to CKD  - Acidosis is resolved after she received 4 hours of IV bicarbonate on 03/02/2013, monitor trend.    9.Sacral Decub Ulcer stage 4 - per wound care ? Infection, empiric ABX, CCS consult.      Code Status: dnr Family Communication: none present  Disposition Plan: SNF   Procedures CT abdomen  pelvis, ultrasound abdomen   Consults     DVT Prophylaxis   Heparin   Lab Results  Component Value Date   PLT 142* 03/02/2013    Medications  Scheduled Meds: . amLODipine  5 mg Oral Daily  . aspirin EC  81 mg Oral Daily  . atropine  1 drop Left Eye BID  . bimatoprost  1 drop Left Eye Daily  . citalopram  20 mg Oral Daily  . dorzolamide-timolol  1 drop Both Eyes BID  . furosemide  40 mg Intravenous BID  . gabapentin  600 mg Oral TID  . heparin  5,000 Units Subcutaneous Q8H  . hydrALAZINE  100 mg Oral TID  . insulin aspart  0-15 Units Subcutaneous Q4H  . metoprolol tartrate  75 mg Oral BID  . multivitamin with minerals  1 tablet Oral Daily  . pantoprazole (PROTONIX) IV  40 mg Intravenous Q24H  . potassium chloride  40 mEq Oral BID  . sodium chloride  1 drop Left Eye BID  . sodium chloride  3 mL Intravenous Q12H  . sodium chloride  3 mL Intravenous Q12H   Continuous Infusions:  PRN Meds:.sodium chloride,  hydrALAZINE, HYDROmorphone (DILAUDID) injection, sodium chloride  Antibiotics   Anti-infectives   None       Time Spent in minutes  35   Susa Raring K M.D on 03/03/2013 at 12:21 PM  Between 7am to 7pm - Pager - [phone]  After 7pm go to [email or web address] - password [email or web address]  And look for the night coverage person covering for me after hours  [facility] Office  [phone]    Subjective:   [name] today has, No headache, No chest pain, No abdominal pain - No Nausea, No new weakness tingling or numbness, No Cough - SOB.   Objective:   Filed Vitals:   03/02/13 1342 03/02/13 2030 03/03/13 0008 03/03/13 0500  BP: 115/69 135/53 132/64 119/53  Pulse: 63 74 90 69  Temp: 98.5 F (36.9 C) 99.9 F (37.7 C)  98.2 F (36.8 C)  TempSrc: Oral Oral  Oral  Resp: 18 20  16   Height:      Weight:    81.1 kg (178 lb 12.7 oz)  SpO2: 98% 97%  97%    Wt Readings from Last 3 Encounters:  03/03/13 81.1 kg (178 lb 12.7 oz)  01/29/13 87.091 kg (192 lb)  01/12/13 88.633 kg (195 lb 6.4 oz)     Intake/Output Summary (Last 24 hours) at 03/03/13 1221 Last data filed at 03/03/13 0500  Gross per 24 hour  Intake 1087.5 ml  Output   3050 ml  Net -1962.5 ml    Exam Awake Alert, Oriented X 3, No new F.N deficits, Normal affect [place].AT,PERRAL Supple Neck,No JVD, No cervical lymphadenopathy appriciated.  Symmetrical Chest wall movement, Good air movement bilaterally, CTAB RRR,No Gallops,Rubs or new Murmurs, No Parasternal Heave +ve B.Sounds, Abd Soft, Non tender, No organomegaly appriciated, No rebound - guarding or rigidity. No Cyanosis, Clubbing or edema, No new Rash or bruise     Data Review   Micro Results Recent Results (from the past 240 hour(s))  CLOSTRIDIUM DIFFICILE BY PCR     Status: None   Collection Time    02/27/13  4:18 PM      Result Value Range Status   C difficile by pcr NEGATIVE  NEGATIVE Final  WOUND CULTURE     Status: None   Collection  Time    03/02/13  4:54 PM  Result Value Range Status   Specimen Description SACRAL   Final   Special Requests Immunocompromised   Final   Gram Stain     Final   Value: ABUNDANT WBC PRESENT, PREDOMINANTLY PMN     NO SQUAMOUS EPITHELIAL CELLS SEEN     MODERATE GRAM POSITIVE COCCI     IN CLUSTERS FEW GRAM NEGATIVE RODS   Culture Culture reincubated for better growth   Final   Report Status PENDING   Incomplete    Radiology Reports Ct Abdomen Pelvis Wo Contrast  02/24/2013   *RADIOLOGY REPORT*  Clinical Data: Abdominal pain  CT ABDOMEN AND PELVIS WITHOUT CONTRAST  Technique:  Multidetector CT imaging of the abdomen and pelvis was performed following the standard protocol without intravenous contrast.  Comparison: 12/30/2012  Findings: Coronary artery calcification.  Tiny hiatal hernia.  Organ abnormality/lesion detection is limited in the absence of intravenous contrast. Within this limitation, unremarkable liver, spleen, pancreas, adrenal glands.  Absent gallbladder.  No biliary ductal dilatation.  Lobular renal contours.  No hydronephrosis or hydroureter.  Colonic diverticulosis.  No overt diverticulitis.  Small bowel loops are normal course and caliber.  No free intraperitoneal air or fluid.  No lymphadenopathy.  There is scattered atherosclerotic calcification of the aorta and its branches. No aneurysmal dilatation.  Decompressed bladder.  Fibroid uterus.  No adnexal mass.  Stranding of the subcutaneous fat right posterior to the coccyx. On the left more inferiorly, stranding of the subcutaneous fat along the left gluteal cleft.  Diffuse osteopenia and multilevel degenerative changes.  No acute osseous finding. CT is limited sensitivity for osteomyelitis detection.  IMPRESSION: Colonic diverticulosis without overt diverticulitis.  Bilateral subcutaneous fat stranding posteriorly may reflect decubitus ulcers.  Recommend direct inspection.   Original Report Authenticated By: Jearld Lesch, M.D.    Dg Chest 2 View  02/24/2013   *RADIOLOGY REPORT*  Clinical Data: Altered mental status, shortness of breath.  CHEST - 2 VIEW  Comparison: 01/06/2013  Findings: Cardiomegaly with vascular congestion.  No overt edema. No focal airspace opacities or effusions.  No acute bony abnormality.  IMPRESSION: Cardiomegaly, vascular congestion.   Original Report Authenticated By: Charlett Nose, M.D.   Dg Chest Port 1 View  03/01/2013   *RADIOLOGY REPORT*  Clinical Data: Suspect fluid overload, history of congestive heart failure and hypertension  PORTABLE CHEST - 1 VIEW  Comparison: Chest x-ray of 02/24/2013  Findings: There is moderate cardiomegaly present with pulmonary vascular congestion and probable small effusions consistent with mild congestive heart failure.  No acute bony abnormality is seen.  IMPRESSION: Probable mild CHF.   Original Report Authenticated By: Dwyane Dee, M.D.    [facility]  Recent Labs Lab 02/24/13 2156 02/25/13 0535 02/26/13 0434 03/02/13 0420  WBC 9.2 12.6* 9.6 9.3  HGB 11.6* 10.9* 9.7* 9.7*  HCT 34.9* 33.3* 30.0* 29.4*  PLT PLATELET CLUMPS NOTED ON SMEAR, COUNT APPEARS ADEQUATE 145* PLATELET CLUMPS NOTED ON SMEAR, COUNT APPEARS ADEQUATE 142*  MCV 82.1 82.4 82.4 81.2  MCH 27.3 27.0 26.6 26.8  MCHC 33.2 32.7 32.3 33.0  RDW 19.8* 19.7* 20.1* 20.4*  LYMPHSABS 0.4*  --   --   --   MONOABS 0.6  --   --   --   EOSABS 0.0  --   --   --   BASOSABS 0.0  --   --   --     Chemistries   Recent Labs Lab 02/24/13 2156 02/25/13 0535 02/26/13 0434 02/27/13 1128 02/28/13 0650 03/02/13 0420  03/03/13 0455  NA 137 139 144 139 132* 138 139  K 2.7* 2.9* 2.8* 3.6 4.0 3.4* 3.1*  CL 101 106 115* 115* 109 109 106  CO2 14* 17* 14* 10* 10* 17* 25  GLUCOSE 274* 153* 149* 176* 185* 165* 158*  BUN 68* 65* 53* 43* 44* 39* 33*  CREATININE 2.89* 2.44* 1.65* 1.35* 1.55* 1.31* 1.17*  CALCIUM 10.2 10.0 9.7 9.9 9.5 9.5 9.5  MG  --   --  1.9  --   --   --  1.5  AST 17 21  --  22  --   --   --    ALT 8 9  --  12  --   --   --   ALKPHOS 149* 135*  --  99  --   --   --   BILITOT 0.4 0.4  --  0.3  --   --   --    ------------------------------------------------------------------------------------------------------------------ estimated creatinine clearance is 36.8 ml/min (by C-G formula based on Cr of 1.17). ------------------------------------------------------------------------------------------------------------------ No results found for this basename: HGBA1C,  in the last 72 hours ------------------------------------------------------------------------------------------------------------------ No results found for this basename: CHOL, HDL, LDLCALC, TRIG, CHOLHDL, LDLDIRECT,  in the last 72 hours ------------------------------------------------------------------------------------------------------------------ No results found for this basename: TSH, T4TOTAL, FREET3, T3FREE, THYROIDAB,  in the last 72 hours ------------------------------------------------------------------------------------------------------------------  Recent Labs  03/02/13 1627  VITAMINB12 694  FOLATE 10.0    Coagulation profile No results found for this basename: INR, PROTIME,  in the last 168 hours  No results found for this basename: DDIMER,  in the last 72 hours  Cardiac Enzymes  Recent Labs Lab 02/24/13 2156  TROPONINI <0.30   ------------------------------------------------------------------------------------------------------------------ No components found with this basename: POCBNP,

## 2013-03-03 NOTE — Consult Note (Signed)
WOC consult Note Reason for Consult:Unstageable Pressure Ulcer buttocks Wound type:Pressure.  Also noted is infectious lesion distal to the pressure ulcers. Pressure Ulcer POA: Yes/No Measurement:3cm x 2cm Unstageable PrU-soft black eschar on right buttock;pin-hole opening could benefit from debridement at bedside.  Mirror-image ulcer on right buttock-both at gluteal cleft.  Boil-like lesion distal to PrU would benefit from lancing with scalpel to drain.  Recommend consult with CCS. Wound bed:Not seen Drainage (amount, consistency, odor) thick, purulent exudate from PrU and boil-like lesion. Periwound:intact Dressing procedure/placement/frequency:NS dressing to all areas to be changed twice daily until CCS can see.  Recommend side to side positioning off of ulcers/lesions. I will remain available as needed, but will not follow.  Please reconsult as needed.. Thanks, Ladona Mow, MSN, RN, [facility], CWOCN [phone])

## 2013-03-03 NOTE — Progress Notes (Signed)
PT Cancellation Note  Patient Details Name: [name] MRN: [id number] DOB: [birth year]   Cancelled Treatment:    Reason Eval/Treat Not Completed: Patient at procedure or test/unavailable Pt not in room.   Carols Clemence,KATHrine E 03/03/2013, 1:44 PM Pager: [phone]

## 2013-03-03 NOTE — Progress Notes (Signed)
Pt's daughter would like to know more about alternative SNF placement for her mother. She is unhappy about the ulcers that were found on her mothers sacrum. [name] would like to be contacted by social work. [phone]. Thanks [name], RN

## 2013-03-04 DIAGNOSIS — L8990 Pressure ulcer of unspecified site, unspecified stage: Secondary | ICD-10-CM | POA: Clinically undetermined

## 2013-03-04 LAB — COMPREHENSIVE METABOLIC PANEL
Albumin: 2.2 g/dL — ABNORMAL LOW (ref 3.5–5.2)
Alkaline Phosphatase: 107 U/L (ref 39–117)
BUN: 28 mg/dL — ABNORMAL HIGH (ref 6–23)
Chloride: 100 mEq/L (ref 96–112)
Potassium: 4.1 mEq/L (ref 3.5–5.1)
Total Bilirubin: 0.4 mg/dL (ref 0.3–1.2)

## 2013-03-04 LAB — GLUCOSE, CAPILLARY: Glucose-Capillary: 309 mg/dL — ABNORMAL HIGH (ref 70–99)

## 2013-03-04 MED ORDER — LIDOCAINE HCL 1 % IJ SOLN
10.0000 mL | Freq: Once | INTRAMUSCULAR | Status: AC
  Administered 2013-03-04: 10 mL via INTRADERMAL

## 2013-03-04 MED ORDER — BOOST / RESOURCE BREEZE PO LIQD
1.0000 | Freq: Two times a day (BID) | ORAL | Status: DC
  Administered 2013-03-04 – 2013-03-05 (×2): 1 via ORAL

## 2013-03-04 MED ORDER — HEPARIN SODIUM (PORCINE) 5000 UNIT/ML IJ SOLN
5000.0000 [IU] | Freq: Three times a day (TID) | INTRAMUSCULAR | Status: DC
  Administered 2013-03-04 – 2013-03-05 (×2): 5000 [IU] via SUBCUTANEOUS
  Filled 2013-03-04 (×5): qty 1

## 2013-03-04 MED ORDER — LIDOCAINE HCL 1 % IJ SOLN
INTRAMUSCULAR | Status: AC
  Filled 2013-03-04: qty 20

## 2013-03-04 MED ORDER — GABAPENTIN 400 MG PO CAPS
400.0000 mg | ORAL_CAPSULE | Freq: Three times a day (TID) | ORAL | Status: DC
  Administered 2013-03-04 – 2013-03-05 (×2): 400 mg via ORAL
  Filled 2013-03-04 (×4): qty 1

## 2013-03-04 MED ORDER — JUVEN PO PACK
1.0000 | PACK | Freq: Two times a day (BID) | ORAL | Status: DC
  Administered 2013-03-04 – 2013-03-05 (×2): 1 via ORAL
  Filled 2013-03-04 (×3): qty 1

## 2013-03-04 MED ORDER — GLUCERNA SHAKE PO LIQD
237.0000 mL | Freq: Three times a day (TID) | ORAL | Status: DC
  Filled 2013-03-04 (×2): qty 237

## 2013-03-04 NOTE — Progress Notes (Signed)
Clinical Social Work  Per chart review, patient's dtr wanted to speak with CSW about placement options. CSW called dtr and left a message with CSW contact information. CSW will continue to follow.  Unk Lightning, LCSW (Coverage for [employer])

## 2013-03-04 NOTE — Consult Note (Signed)
Patient seen with ER,NP. Will need to be debrided under local Thursday

## 2013-03-04 NOTE — Progress Notes (Signed)
NUTRITION FOLLOW UP  Intervention:   Provide Resource Breeze BID Continue Juven BID Continue Multivitamin with minerals daily Add Glucerna shakes TID when diet is advanced  Nutrition Dx:   1: Inadequate oral intake related to decreased appetite as evidenced by 11% wt loss in less than 2 months; ongoing  2: Increased nutrient needs related to wound healing as evidenced by pt with stage 3 pressure ulcer; ongoing   Goal:   Pt to meet >/= 90% of their estimated nutrition needs; not met, pt still on clear liquid diet  Monitor:   Diet advancement/PO intake; clear liquids, po intake improving Weight; 8 lb weight increase from 5/15 to 5/22 Labs; High lipase (trending down), high glucose  Assessment:   Pt asleep at time of visit but, RN reports that when pt is awake she is still unable to provide history. RN states that pt was pocketing pills and wasn't drinking well the past few days but, since her labs and mental status have improved she has been drinking much more; pt has been having jello, chicken broth, popsicles, and sprite per RN.   Height: Ht Readings from Last 1 Encounters:  02/25/13 5\' 3"  (1.6 m)    Weight Status:   Wt Readings from Last 1 Encounters:  03/04/13 182 lb 8.7 oz (82.8 kg)    Re-estimated needs:  Kcal: 1735-1970  Protein: 110-128 grams  Fluid: 2.4 L  Skin: +1 generalized edema, non-pitting RUE and LUE edema; wound on buttocks  Diet Order: Clear Liquid   Intake/Output Summary (Last 24 hours) at 03/04/13 1527 Last data filed at 03/04/13 1440  Gross per 24 hour  Intake    420 ml  Output   2500 ml  Net  -2080 ml    Last BM: 5/19   Labs:   Recent Labs Lab 02/26/13 0434  03/02/13 0420 03/03/13 0455 03/04/13 0544  NA 144  < > 138 139 135  K 2.8*  < > 3.4* 3.1* 4.1  CL 115*  < > 109 106 100  CO2 14*  < > 17* 25 26  BUN 53*  < > 39* 33* 28*  CREATININE 1.65*  < > 1.31* 1.17* 1.20*  CALCIUM 9.7  < > 9.5 9.5 9.3  MG 1.9  --   --  1.5  --    GLUCOSE 149*  < > 165* 158* 149*  < > = values in this interval not displayed.  CBG (last 3)   Recent Labs  03/03/13 1633 03/03/13 2008 03/03/13 2358  GLUCAP 160* 309* 218*    Scheduled Meds: . amLODipine  5 mg Oral Daily  . aspirin EC  81 mg Oral Daily  . atropine  1 drop Left Eye BID  . bimatoprost  1 drop Left Eye Daily  . citalopram  20 mg Oral Daily  . dorzolamide-timolol  1 drop Both Eyes BID  . furosemide  40 mg Oral Daily  . gabapentin  600 mg Oral TID  . heparin  5,000 Units Subcutaneous Q8H  . hydrALAZINE  100 mg Oral TID  . insulin aspart  0-15 Units Subcutaneous Q4H  . lidocaine      . metoprolol tartrate  75 mg Oral BID  . multivitamin with minerals  1 tablet Oral Daily  . nutrition supplement  1 packet Oral BID BM  . pantoprazole (PROTONIX) IV  40 mg Intravenous Q24H  . piperacillin-tazobactam (ZOSYN)  IV  3.375 g Intravenous Q8H  . sodium chloride  1 drop Left Eye  BID  . sodium chloride  3 mL Intravenous Q12H  . sodium chloride  3 mL Intravenous Q12H  . vancomycin  750 mg Intravenous Q24H    Continuous Infusions:   Ian Malkin RD, LDN Inpatient Clinical Dietitian Pager: [phone] After Hours Pager: [phone]

## 2013-03-04 NOTE — Progress Notes (Signed)
Patient ID: PUANANI GENE, female   DOB: 02-10-1928, 79 y.o.   MRN: [id number]  CADINCE HILSCHER [id number] [birth year]  CARE TEAM:  PCP: Illene Regulus, MD  Outpatient Care Team: Patient Care Team: Jacques Navy, MD as PCP - General [name], MD (Endocrinology) Kathleene Hazel, MD (Cardiology) Dorothyann Gibbs, MD (Ophthalmology)  Subjective: Pt asleep, awakens to command.  Able to tolerate oral intake.  Denies fever or chills.    Objective:  Vital signs:  Filed Vitals:   03/03/13 1527 03/03/13 2000 03/03/13 2144 03/04/13 0500  BP: 110/49 114/50 133/52 142/56  Pulse: 66 70 65 65  Temp: 97.7 F (36.5 C) 98.7 F (37.1 C) 99.4 F (37.4 C) 99.4 F (37.4 C)  TempSrc: Oral Oral Oral Oral  Resp: 18 20 20 16   Height:      Weight:    182 lb 8.7 oz (82.8 kg)  SpO2: 97% 97% 98% 95%    Last BM Date: 03/01/13  Intake/Output   Yesterday:  05/21 0701 - 05/22 0700 In: 300 [P.O.:300] Out: 2300 [Urine:2300] This shift:  Total I/O In: 120 [P.O.:120] Out: -    Physical Exam:  General: Pt asleep, easily aroused.  Alert and oriented to person and place. Cardiovascular: Normal rate and regular rhythm. Exam reveals no gallop. No murmur heard.  Respiratory: Effort normal and breath sounds normal. No respiratory distress. She has no wheezes.  GI: Soft. Bowel sounds are normal. She exhibits no distension. There is no tenderness.   Problem List:   Principal Problem:   Acute pancreatitis Active Problems:   IDDM   DYSLIPIDEMIA   Acute-on-chronic kidney injury   Hypokalemia   Altered mental status   Metabolic acidosis   Results:   Labs: Results for orders placed during the hospital encounter of 02/24/13 (from the past 48 hour(s))  VITAMIN B12     Status: None   Collection Time    03/02/13  4:27 PM      Result Value Range   Vitamin B-12 694  211 - 911 pg/mL  FOLATE     Status: None   Collection Time    03/02/13  4:27 PM      Result Value Range   Folate  10.0     Comment: (NOTE)     Reference Ranges            Deficient:       0.4 - 3.3 ng/mL            Indeterminate:   3.4 - 5.4 ng/mL            Normal:              > 5.4 ng/mL  RPR     Status: None   Collection Time    03/02/13  4:27 PM      Result Value Range   RPR NON REACTIVE  NON REACTIVE  GLUCOSE, CAPILLARY     Status: Abnormal   Collection Time    03/02/13  4:40 PM      Result Value Range   Glucose-Capillary 190 (*) 70 - 99 mg/dL   Comment 1 Documented in Chart     Comment 2 Notify RN    WOUND CULTURE     Status: None   Collection Time    03/02/13  4:54 PM      Result Value Range   Specimen Description SACRAL     Special Requests Immunocompromised     Gram  Stain       Value: ABUNDANT WBC PRESENT, PREDOMINANTLY PMN     NO SQUAMOUS EPITHELIAL CELLS SEEN     MODERATE GRAM POSITIVE COCCI     IN CLUSTERS FEW GRAM NEGATIVE RODS   Culture Culture reincubated for better growth     Report Status PENDING    GLUCOSE, CAPILLARY     Status: Abnormal   Collection Time    03/02/13  8:24 PM      Result Value Range   Glucose-Capillary 205 (*) 70 - 99 mg/dL  GLUCOSE, CAPILLARY     Status: Abnormal   Collection Time    03/02/13 11:53 PM      Result Value Range   Glucose-Capillary 162 (*) 70 - 99 mg/dL  GLUCOSE, CAPILLARY     Status: Abnormal   Collection Time    03/03/13  4:10 AM      Result Value Range   Glucose-Capillary 155 (*) 70 - 99 mg/dL  LIPASE, BLOOD     Status: Abnormal   Collection Time    03/03/13  4:55 AM      Result Value Range   Lipase 656 (*) 11 - 59 U/L  BASIC METABOLIC PANEL     Status: Abnormal   Collection Time    03/03/13  4:55 AM      Result Value Range   Sodium 139  135 - 145 mEq/L   Potassium 3.1 (*) 3.5 - 5.1 mEq/L   Chloride 106  96 - 112 mEq/L   CO2 25  19 - 32 mEq/L   Glucose, Bld 158 (*) 70 - 99 mg/dL   BUN 33 (*) 6 - 23 mg/dL   Creatinine, Ser 1.61 (*) 0.50 - 1.10 mg/dL   Calcium 9.5  8.4 - 09.6 mg/dL   GFR calc non Af Amer 42 (*) >90  mL/min   GFR calc Af Amer 49 (*) >90 mL/min   Comment:            The eGFR has been calculated     using the CKD EPI equation.     This calculation has not been     validated in all clinical     situations.     eGFR's persistently     <90 mL/min signify     possible Chronic Kidney Disease.  MAGNESIUM     Status: None   Collection Time    03/03/13  4:55 AM      Result Value Range   Magnesium 1.5  1.5 - 2.5 mg/dL  GLUCOSE, CAPILLARY     Status: Abnormal   Collection Time    03/03/13  7:36 AM      Result Value Range   Glucose-Capillary 131 (*) 70 - 99 mg/dL  GLUCOSE, CAPILLARY     Status: Abnormal   Collection Time    03/03/13 12:24 PM      Result Value Range   Glucose-Capillary 151 (*) 70 - 99 mg/dL  WOUND CULTURE     Status: None   Collection Time    03/03/13  4:33 PM      Result Value Range   Specimen Description PERIRECTAL     Special Requests NONE     Gram Stain       Value: NO WBC SEEN     NO SQUAMOUS EPITHELIAL CELLS SEEN     FEW GRAM POSITIVE COCCI     IN CLUSTERS   Culture Culture reincubated for better growth  Report Status PENDING    GLUCOSE, CAPILLARY     Status: Abnormal   Collection Time    03/03/13  4:33 PM      Result Value Range   Glucose-Capillary 160 (*) 70 - 99 mg/dL  GLUCOSE, CAPILLARY     Status: Abnormal   Collection Time    03/03/13  8:08 PM      Result Value Range   Glucose-Capillary 309 (*) 70 - 99 mg/dL  GLUCOSE, CAPILLARY     Status: Abnormal   Collection Time    03/03/13 11:58 PM      Result Value Range   Glucose-Capillary 218 (*) 70 - 99 mg/dL  LIPASE, BLOOD     Status: Abnormal   Collection Time    03/04/13  5:44 AM      Result Value Range   Lipase 590 (*) 11 - 59 U/L  COMPREHENSIVE METABOLIC PANEL     Status: Abnormal   Collection Time    03/04/13  5:44 AM      Result Value Range   Sodium 135  135 - 145 mEq/L   Potassium 4.1  3.5 - 5.1 mEq/L   Comment: RESULT REPEATED AND VERIFIED     DELTA CHECK NOTED   Chloride 100   96 - 112 mEq/L   CO2 26  19 - 32 mEq/L   Glucose, Bld 149 (*) 70 - 99 mg/dL   BUN 28 (*) 6 - 23 mg/dL   Creatinine, Ser 1.61 (*) 0.50 - 1.10 mg/dL   Calcium 9.3  8.4 - 09.6 mg/dL   Total Protein 5.8 (*) 6.0 - 8.3 g/dL   Albumin 2.2 (*) 3.5 - 5.2 g/dL   AST 15  0 - 37 U/L   ALT 11  0 - 35 U/L   Alkaline Phosphatase 107  39 - 117 U/L   Total Bilirubin 0.4  0.3 - 1.2 mg/dL   GFR calc non Af Amer 41 (*) >90 mL/min   GFR calc Af Amer 47 (*) >90 mL/min   Comment:            The eGFR has been calculated     using the CKD EPI equation.     This calculation has not been     validated in all clinical     situations.     eGFR's persistently     <90 mL/min signify     possible Chronic Kidney Disease.    Imaging / Studies: US Abdomen Complete  03/03/2013   *RADIOLOGY REPORT*  Clinical Data:  Pancreatitis.  ABDOMINAL ULTRASOUND COMPLETE  Comparison:  02/24/2013  Findings:  Gallbladder:  Previous cholecystectomy  Common Bile Duct:  Within normal limits in caliber.  Liver: No focal mass lesion identified.  Within normal limits in parenchymal echogenicity.  IVC:  Appears normal.  Pancreas:  Diminished exam detail.No abnormality identified.  Spleen:  Within normal limits in size and echotexture.  Right kidney:  Irregular and lobular contour measuring 9.4 cm. Normal in parenchymal echogenicity.  No evidence of mass or hydronephrosis.  Left kidney:  Irregular and lobular contour measuring 9.9 cm. Normal in parenchymal echogenicity.  No evidence of mass or hydronephrosis.  Abdominal Aorta:  No aneurysm identified.  IMPRESSION:  1.  No acute findings. 2.  Prior cholecystectomy.   Original Report Authenticated By: Signa Kell, M.D.    Medications / Allergies: per chart  Antibiotics: Anti-infectives   Start     Dose/Rate Route Frequency Ordered Stop   03/04/13 1800  vancomycin (VANCOCIN) IVPB 750 mg/150 ml premix     750 mg 150 mL/hr over 60 Minutes Intravenous Every 24 hours 03/03/13 1701      03/03/13 1700  vancomycin (VANCOCIN) IVPB 1000 mg/200 mL premix  Status:  Discontinued     1,000 mg 200 mL/hr over 60 Minutes Intravenous Every 24 hours 03/03/13 1632 03/03/13 1659   03/03/13 1700  vancomycin (VANCOCIN) IVPB 1000 mg/200 mL premix     1,000 mg 200 mL/hr over 60 Minutes Intravenous  Once 03/03/13 1701 03/03/13 1828   03/03/13 1630  piperacillin-tazobactam (ZOSYN) IVPB 3.375 g     3.375 g 12.5 mL/hr over 240 Minutes Intravenous Every 8 hours 03/03/13 1611        Assessment/Plan Decubitus Ulcer -pt tolerated I&D quite well.  Refer to procedure note.  A good amount of necrotic tissue was excised.  There is no evicdence infection. -BID wet to dry dressing changes. -Optimize nutrition and mobility to aid with healing.  Infected Perianal Abscess -odorous purulent discharge.  This wound will require TID packing with plain form guaze.  Needs to be monitored for bleeding -Continue with atbx and await final culture report -May resume heparin tonight -Will continue to follow patient  Ashok Norris, [facility] Pager [phone] Office [phone]  03/04/2013 1:08 PM

## 2013-03-04 NOTE — Progress Notes (Signed)
Physical Therapy Treatment Patient Details Name: [name] MRN: [id number] DOB: 1928/04/26 Today's Date: 03/04/2013 Time: 5284-1324 PT Time Calculation (min): 20 min  PT Assessment / Plan / Recommendation Comments on Treatment Session  Pt appears a little more awake today however closes eyes frequently and reports pain in sacral area (decubitus I&D recent).  Pt assisted to sitting EOB but unable to tolerate due to pain so also worked on rolling while changing bed pads due to saturated with serosanginous drainage.    Follow Up Recommendations  SNF     Does the patient have the potential to tolerate intense rehabilitation     Barriers to Discharge        Equipment Recommendations  Wheelchair (measurements PT);Wheelchair cushion (measurements PT)    Recommendations for Other Services    Frequency     Plan Discharge plan remains appropriate;Frequency remains appropriate    Precautions / Restrictions Precautions Precautions: Fall   Pertinent Vitals/Pain Pain in sacral area (I&D recent), RN assisted with bed mobility and aware of pain    Mobility  Bed Mobility Bed Mobility: Supine to Sit;Rolling Left;Rolling Right;Sit to Supine Rolling Right: 2: Max assist Rolling Left: 2: Max assist Supine to Sit: 2: Max assist;With rails;HOB elevated Sit to Supine: 2: Max assist;With rail;HOB flat Details for Bed Mobility Assistance: Pt initiated movment of LEs only, but with retropulsion of rest of upper body and increased posterior lean with movement towards EOB and pt states pain in sacral area is causing factor, verbal cues for technique, guided arms to rails with rolling however pt unable to assist with lower body Ambulation/Gait Ambulation/Gait Assistance:  (unable)    Exercises     PT Diagnosis:    PT Problem List:   PT Treatment Interventions:     PT Goals Acute Rehab PT Goals PT Goal: Supine/Side to Sit - Progress: Progressing toward goal  Visit Information  Last PT  Received On: 03/04/13 Assistance Needed: +2    Subjective Data  Subjective: OK   Cognition  Cognition Arousal/Alertness: Lethargic (more awake this session however closes eyes) Behavior During Therapy: Flat affect    Balance     End of Session PT - End of Session Activity Tolerance: Patient limited by fatigue;Patient limited by pain Patient left: in bed;with call bell/phone within reach;with bed alarm set Nurse Communication: Mobility status (RN assisted with rolling)   GP     Waldo Damian,KATHrine E 03/04/2013, 4:42 PM Zenovia Jarred, PT, DPT 03/04/2013 Pager: [phone]

## 2013-03-04 NOTE — Evaluation (Addendum)
SLP Cancellation Note  Patient Details Name: [name] MRN: [id number] DOB: 1928/08/19   Cancelled treatment:       Reason Eval/Treat Not Completed: Fatigue/lethargy limiting ability to participate  Pt woke briefly to tactile stimulation from SLP and quickly returned to sleep.  RN reports pt was given dilaudid for procedure earlier.  Will continue efforts to evaluate as ordered.  Donavan Burnet, MS [facility] SLP [phone]     Chales Abrahams 03/04/2013, 12:23 PM

## 2013-03-04 NOTE — Progress Notes (Signed)
Agree with A&P of ER,NP -

## 2013-03-04 NOTE — Progress Notes (Signed)
Clinical Social Work  CSW went to room and no family present. CSW called dtr again and left a message with CSW contact information. CSW will continue to follow.  Unk Lightning, LCSW (Coverage for [employer])

## 2013-03-04 NOTE — Progress Notes (Signed)
Inpatient Diabetes Program Recommendations  AACE/ADA: New Consensus Statement on Inpatient Glycemic Control (2013)  Target Ranges:  Prepandial:   less than 140 mg/dL      Peak postprandial:   less than 180 mg/dL (1-2 hours)      Critically ill patients:  140 - 180 mg/dL   Reason for Assessment:  Hyperglycemia  Results for ASHARIA, LOTTER (MRN [id number]) as of 03/04/2013 14:14  Ref. Range 03/03/2013 20:08 03/03/2013 23:58  Glucose-Capillary Latest Range: 70-99 mg/dL 478 (H) 295 (H)     Inpatient Diabetes Program Recommendations Insulin - Meal Coverage: Consider addition of meal coverage insulin - Novolog 3 units tidwc if pt eats >50% meal  Note: Pt on Novolog 4 units tidwc at home.  Thank you. Ailene Ards, RD, LDN, CDE Inpatient Diabetes Coordinator [phone]

## 2013-03-04 NOTE — Procedures (Signed)
As above. The decubitus should improve with local therapy and don't believe additional I&D will be needed for the small abscess

## 2013-03-04 NOTE — Procedures (Addendum)
Pre-operative Diagnosis: Right gluteal decubitus ulcer             perianal abscess  Post-operative Diagnosis:Right gluteal decubitus ulcer             infected perianal abscess  Procedures:  Excisional debridement of gluteal decubitus ulcer using scalpel    Incision and drainage                Surgeon: Ashok Norris, ANP, BC Assist: Senaida Ores, PA-C  Indications: infection and pain The patient's symptoms are not adequately controlled.  Non-operative treatment has not healed the abscess and ulcer.  Therefore, I recommended incision & drainage and debdidement of the abscess to allow the infection to resolve and heal.  Technique, risks, benefits, alternatives discussed.  The patients POA expressed understanding & wished to proceed.  The procedure, risks and complications have been discussed in detail (including, but not limited to airway compromise, infection, bleeding) with the patient, and the patient has signed consent to the procedure.  Findings: Infected perianal abscess Necrotic decubitus ulcer   Anesthesia: Lidocaine 1% local  Fluids: none  EBL: 10ml  Drains: none  Specimens: culture of wound  Complications: no immediate complications noted  Condition: Tolerated procedure well and Stable  Procedure Details  The patient was positioned in right lateral position. The region was then prepped using betadine.  I sterilely injected a local aesthetic (1% lidocaine 28mL total infused) in the regions surrounding the right gluteal decubitus ulcer and area surrounding the abscess on the left.  The patient tolerated the procedure well.  I then used sharp dissection with scalpel to remove necrotic tissue to include subcutaneous tissue and muscle until I reached more healthy, vascularized tissue.  Hemostasis was ensured.  I then packed the wound with clean gauze & dry dressing.   I then incised the skin over the abscess to release the infection which was 4x2cm making an elliptical  incision to allow for adequate opening for drainage and prevent skin closure.  A moderate amount of odorous and purulent discharge was expressed.  I packed the wound with iodoform.   The patient tolerated the procedure. The patient was observed until stable.  Kiani Wurtzel, ANP-BC 03/04/13 12:49 PM

## 2013-03-05 ENCOUNTER — Other Ambulatory Visit: Payer: Self-pay | Admitting: *Deleted

## 2013-03-05 DIAGNOSIS — Z299 Encounter for prophylactic measures, unspecified: Secondary | ICD-10-CM

## 2013-03-05 LAB — COMPREHENSIVE METABOLIC PANEL
ALT: 11 U/L (ref 0–35)
Alkaline Phosphatase: 104 U/L (ref 39–117)
BUN: 38 mg/dL — ABNORMAL HIGH (ref 6–23)
CO2: 26 mEq/L (ref 19–32)
Chloride: 100 mEq/L (ref 96–112)
GFR calc Af Amer: 35 mL/min — ABNORMAL LOW (ref >90)
GFR calc non Af Amer: 30 mL/min — ABNORMAL LOW (ref >90)
Glucose, Bld: 140 mg/dL — ABNORMAL HIGH (ref 70–99)
Potassium: 4.4 mEq/L (ref 3.5–5.1)
Sodium: 135 mEq/L (ref 135–145)
Total Bilirubin: 0.4 mg/dL (ref 0.3–1.2)

## 2013-03-05 LAB — CBC
MCHC: 33 g/dL (ref 30.0–36.0)
Platelets: 180 10*3/uL (ref 150–400)
RDW: 20.2 % — ABNORMAL HIGH (ref 11.5–15.5)
WBC: 12.4 10*3/uL — ABNORMAL HIGH (ref 4.0–10.5)

## 2013-03-05 LAB — GLUCOSE, CAPILLARY
Glucose-Capillary: 158 mg/dL — ABNORMAL HIGH (ref 70–99)
Glucose-Capillary: 150 mg/dL — ABNORMAL HIGH (ref 70–99)
Glucose-Capillary: 340 mg/dL — ABNORMAL HIGH (ref 70–99)
Glucose-Capillary: 155 mg/dL — ABNORMAL HIGH (ref 70–99)
Glucose-Capillary: 115 mg/dL — ABNORMAL HIGH (ref 70–99)

## 2013-03-05 MED ORDER — SULFAMETHOXAZOLE-TMP DS 800-160 MG PO TABS
1.0000 | ORAL_TABLET | Freq: Two times a day (BID) | ORAL | Status: DC
  Administered 2013-03-05: 1 via ORAL
  Filled 2013-03-05 (×2): qty 1

## 2013-03-05 MED ORDER — DIAZEPAM 5 MG PO TABS: 5.0000 mg | ORAL_TABLET | Freq: Two times a day (BID) | ORAL | Status: DC | PRN

## 2013-03-05 MED ORDER — AMOXICILLIN-POT CLAVULANATE 500-125 MG PO TABS
1.0000 | ORAL_TABLET | Freq: Two times a day (BID) | ORAL | Status: DC
  Administered 2013-03-05: 500 mg via ORAL
  Filled 2013-03-05 (×2): qty 1

## 2013-03-05 MED ORDER — DIAZEPAM 5 MG PO TABS: ORAL_TABLET | ORAL | Status: DC

## 2013-03-05 MED ORDER — AMOXICILLIN-POT CLAVULANATE 500-125 MG PO TABS
1.0000 | ORAL_TABLET | Freq: Two times a day (BID) | ORAL | Status: DC
  Filled 2013-03-05: qty 1

## 2013-03-05 MED ORDER — AMOXICILLIN-POT CLAVULANATE 500-125 MG PO TABS: 1.0000 | ORAL_TABLET | Freq: Two times a day (BID) | ORAL | Status: DC

## 2013-03-05 MED ORDER — SACCHAROMYCES BOULARDII 250 MG PO CAPS: 250.0000 mg | ORAL_CAPSULE | Freq: Two times a day (BID) | ORAL | Status: DC

## 2013-03-05 MED ORDER — SULFAMETHOXAZOLE-TMP DS 800-160 MG PO TABS: 1.0000 | ORAL_TABLET | Freq: Two times a day (BID) | ORAL | Status: DC

## 2013-03-05 MED ORDER — FUROSEMIDE 80 MG PO TABS: 40.0000 mg | ORAL_TABLET | Freq: Every day | ORAL | Status: DC

## 2013-03-05 NOTE — Progress Notes (Signed)
Clinical Social Work  CSW spoke with dtr [name]) who confirms she desires patient to return back to [place]. CSW faxed DC summary to SNF and SNF agreeable to admission today. CSW informed patient, dtr and RN of DC and all parties agreeable. CSW prepared DC packet with FL2, DNR form and hard scripts included. CSW coordinated transportation via [employer], maintenance # [id number]). CSW is signing off but available if needed.  Unk Lightning, LCSW (Coverage for [employer])

## 2013-03-05 NOTE — Progress Notes (Signed)
Clinical Social Work  CSW continues to try and reach dtr in order to discuss placement options. CSW called dtr and left another message. CSW will continue to follow.  Unk Lightning, LCSW (Coverage for [employer])

## 2013-03-05 NOTE — Discharge Summary (Signed)
[facility]                                                                                   [name], is a 79 y.o. female  DOB 10-06-1928  MRN [id number].  Admission date:  02/24/2013  Discharge Date:  03/05/2013  Primary MD  Illene Regulus, MD  Admitting Physician  Hillary Bow, DO  Admission Diagnosis  Abdominal pain, unspecified site [789.00] Hypokalemia [276.8] Pancreatitis [577.0] Altered mental status [780.97] ARF (acute renal failure) [584.9] Acute-on-chronic kidney injury [584.9, 585.9]  Discharge Diagnosis     Principal Problem:   Acute pancreatitis Active Problems:   IDDM   DYSLIPIDEMIA   Acute-on-chronic kidney injury   Hypokalemia   Altered mental status   Metabolic acidosis   Decubitus ulcer   Perianal abscess    Past Medical History  Diagnosis Date  . Coronary artery disease   . Renal artery stenosis   . Hypertension   . Dyslipidemia   . GERD (gastroesophageal reflux disease)   . Tension headache   . Abdominal pain   . Anemia, unspecified   . Gout   . Back pain, chronic   . History of tonsillectomy   . History of cholecystectomy   . Depression   . IDDM (insulin dependent diabetes mellitus)   . Diabetes mellitus   . CHF (congestive heart failure)     Past Surgical History  Procedure Laterality Date  . Tonsillectomy    . Cholecystectomy  40 years ago     Recommendations for primary care physician for things to follow:   Follow BMP, Lipase, Decubiti    Discharge Diagnoses:   Principal Problem:   Acute pancreatitis Active Problems:   IDDM   DYSLIPIDEMIA   Acute-on-chronic kidney injury   Hypokalemia   Altered mental status   Metabolic acidosis   Decubitus ulcer   Perianal abscess    Discharge Condition: Stable   Diet recommendation: See Discharge Instructions below   Consults Surgery    History of present illness and  Hospital Course:     Kindly see H&P for history of present illness and  admission details, please review complete Labs, Consult reports and Test reports for all details in brief [name], is a 79 y.o. female, patient with history of is on insulin, chronic kidney disease stage IV baseline creatinine 1.5, dyslipidemia, advanced deconditioning, decubitus ulcer upon admission noted on the ER workup, perianal abscess status post drainage by general surgery this admission, she was admitted to the hospital with decreased appetite, decreased mobility generalized weakness, also with weight abdominal discomfort, the ER workup was suggestive of acute pancreatitis and decubitus ulcer on CT scan, she also developed mild acute renal failure on chronic kidney disease stage IV, she was admitted to the hospital provided bowel rest, initially IV fluids, her statin was stopped as this was thought to be her culprit for pancreatitis, she has had cholecystectomy in the past and her triglycerides were stable, with supportive care her abdominal discomfort resolved, her appetite is back and she is asymptomatic in terms of bowel discomfort after eating heart healthy carbohydrate limited diet. We recommended she follow with GI  one time as outpatient for her pancreatitis episode.    Patient developed decreased mental status which was noted on admission secondary to acute pancreatitis and metabolic derangement causing metabolic encephalopathy, this has resolved with supportive care.   Her acute renal failure with chronic kidney disease has resolved, she did have some metabolic acidosis upon admission and required brief administration of bicarbonate for 4 hours on 03/02/2013, her renal function is back to her baseline and her bicarbonate levels have normalized. She will need close monitoring of her BMP weight and Lasix dose.   For her hypertension and diabetes home medications will be continued.    Upon admission in the ER her CT scan revealed evidence of decubitus ulcer, upon evaluation she  had a stage IV decubitus ulcer on her back along with perianal abscess, she was seen by general surgery she underwent incision and drainage bedside and tolerated the procedure well, she has no remaining signs of drainage or cellulitis, she will be given a few more days of oral antibiotics i.e. 6 more days of Augmentin and Bactrim. She will require continued wound care, skin care per protocol. One week followup with [facility] surgery post discharge.       Today   Subjective:   [name] today has no headache,no chest abdominal pain,no new weakness tingling or numbness, feels much better.  Objective:   Blood pressure 128/59, pulse 70, temperature 98.4 F (36.9 C), temperature source Oral, resp. rate 20, height 5\' 3"  (1.6 m), weight 82.192 kg (181 lb 3.2 oz), SpO2 97.00%.   Intake/Output Summary (Last 24 hours) at 03/05/13 0926 Last data filed at 03/05/13 0920  Gross per 24 hour  Intake   1030 ml  Output    750 ml  Net    280 ml    Exam Awake Alert, Oriented *3, No new F.N deficits, Normal affect Grady.AT,PERRAL Supple Neck,No JVD, No cervical lymphadenopathy appriciated.  Symmetrical Chest wall movement, Good air movement bilaterally, CTAB RRR,No Gallops,Rubs or new Murmurs, No Parasternal Heave +ve B.Sounds, Abd Soft, Non tender, No organomegaly appriciated, No rebound -guarding or rigidity. No Cyanosis, Clubbing or edema, No new Rash or bruise  Data Review   Major procedures and Radiology Reports - PLEASE review detailed and final reports for all details in brief -       Ct Abdomen Pelvis Wo Contrast  02/24/2013   *RADIOLOGY REPORT*  Clinical Data: Abdominal pain  CT ABDOMEN AND PELVIS WITHOUT CONTRAST  Technique:  Multidetector CT imaging of the abdomen and pelvis was performed following the standard protocol without intravenous contrast.  Comparison: 12/30/2012  Findings: Coronary artery calcification.  Tiny hiatal hernia.  Organ abnormality/lesion detection is  limited in the absence of intravenous contrast. Within this limitation, unremarkable liver, spleen, pancreas, adrenal glands.  Absent gallbladder.  No biliary ductal dilatation.  Lobular renal contours.  No hydronephrosis or hydroureter.  Colonic diverticulosis.  No overt diverticulitis.  Small bowel loops are normal course and caliber.  No free intraperitoneal air or fluid.  No lymphadenopathy.  There is scattered atherosclerotic calcification of the aorta and its branches. No aneurysmal dilatation.  Decompressed bladder.  Fibroid uterus.  No adnexal mass.  Stranding of the subcutaneous fat right posterior to the coccyx. On the left more inferiorly, stranding of the subcutaneous fat along the left gluteal cleft.  Diffuse osteopenia and multilevel degenerative changes.  No acute osseous finding. CT is limited sensitivity for osteomyelitis detection.  IMPRESSION: Colonic diverticulosis without overt diverticulitis.  Bilateral subcutaneous  fat stranding posteriorly may reflect decubitus ulcers.  Recommend direct inspection.   Original Report Authenticated By: Jearld Lesch, M.D.   Dg Chest 2 View  02/24/2013   *RADIOLOGY REPORT*  Clinical Data: Altered mental status, shortness of breath.  CHEST - 2 VIEW  Comparison: 01/06/2013  Findings: Cardiomegaly with vascular congestion.  No overt edema. No focal airspace opacities or effusions.  No acute bony abnormality.  IMPRESSION: Cardiomegaly, vascular congestion.   Original Report Authenticated By: Charlett Nose, M.D.   US Abdomen Complete  03/03/2013   *RADIOLOGY REPORT*  Clinical Data:  Pancreatitis.  ABDOMINAL ULTRASOUND COMPLETE  Comparison:  02/24/2013  Findings:  Gallbladder:  Previous cholecystectomy  Common Bile Duct:  Within normal limits in caliber.  Liver: No focal mass lesion identified.  Within normal limits in parenchymal echogenicity.  IVC:  Appears normal.  Pancreas:  Diminished exam detail.No abnormality identified.  Spleen:  Within normal limits in  size and echotexture.  Right kidney:  Irregular and lobular contour measuring 9.4 cm. Normal in parenchymal echogenicity.  No evidence of mass or hydronephrosis.  Left kidney:  Irregular and lobular contour measuring 9.9 cm. Normal in parenchymal echogenicity.  No evidence of mass or hydronephrosis.  Abdominal Aorta:  No aneurysm identified.  IMPRESSION:  1.  No acute findings. 2.  Prior cholecystectomy.   Original Report Authenticated By: Signa Kell, M.D.   Dg Chest Port 1 View  03/01/2013   *RADIOLOGY REPORT*  Clinical Data: Suspect fluid overload, history of congestive heart failure and hypertension  PORTABLE CHEST - 1 VIEW  Comparison: Chest x-ray of 02/24/2013  Findings: There is moderate cardiomegaly present with pulmonary vascular congestion and probable small effusions consistent with mild congestive heart failure.  No acute bony abnormality is seen.  IMPRESSION: Probable mild CHF.   Original Report Authenticated By: Dwyane Dee, M.D.    Micro Results      Recent Results (from the past 240 hour(s))  CLOSTRIDIUM DIFFICILE BY PCR     Status: None   Collection Time    02/27/13  4:18 PM      Result Value Range Status   C difficile by pcr NEGATIVE  NEGATIVE Final  WOUND CULTURE     Status: None   Collection Time    03/02/13  4:54 PM      Result Value Range Status   Specimen Description SACRAL   Final   Special Requests Immunocompromised   Final   Gram Stain     Final   Value: ABUNDANT WBC PRESENT, PREDOMINANTLY PMN     NO SQUAMOUS EPITHELIAL CELLS SEEN     MODERATE GRAM POSITIVE COCCI     IN CLUSTERS FEW GRAM NEGATIVE RODS   Culture     Final   Value: MODERATE STAPHYLOCOCCUS AUREUS     Note: RIFAMPIN AND GENTAMICIN SHOULD NOT BE USED AS SINGLE DRUGS FOR TREATMENT OF STAPH INFECTIONS.   Report Status PENDING   Incomplete  WOUND CULTURE     Status: None   Collection Time    03/03/13  4:33 PM      Result Value Range Status   Specimen Description PERIRECTAL   Final   Special  Requests NONE   Final   Gram Stain     Final   Value: NO WBC SEEN     NO SQUAMOUS EPITHELIAL CELLS SEEN     FEW GRAM POSITIVE COCCI     IN CLUSTERS   Culture     Final   Value:  ABUNDANT STAPHYLOCOCCUS AUREUS     Note: RIFAMPIN AND GENTAMICIN SHOULD NOT BE USED AS SINGLE DRUGS FOR TREATMENT OF STAPH INFECTIONS.   Report Status PENDING   Incomplete     CBC w Diff: Lab Results  Component Value Date   WBC 12.4* 03/05/2013   HGB 9.0* 03/05/2013   HCT 27.3* 03/05/2013   PLT 180 03/05/2013   LYMPHOPCT 4* 02/24/2013   MONOPCT 6 02/24/2013   EOSPCT 0 02/24/2013   BASOPCT 0 02/24/2013    CMP: Lab Results  Component Value Date   NA 135 03/05/2013   K 4.4 03/05/2013   CL 100 03/05/2013   CO2 26 03/05/2013   BUN 38* 03/05/2013   CREATININE 1.53* 03/05/2013   PROT 6.0 03/05/2013   ALBUMIN 2.3* 03/05/2013   BILITOT 0.4 03/05/2013   ALKPHOS 104 03/05/2013   AST 13 03/05/2013   ALT 11 03/05/2013  .   Discharge Instructions     Follow with Primary MD Illene Regulus, MD in 4 days   Get CBC, CMP, checked 4 days by Primary MD and again as instructed by your Primary MD. Get a 2 view Chest X ray done next visit.  Get Medicines reviewed and adjusted.  Please request your Prim.MD to go over all Hospital Tests and Procedure/Radiological results at the follow up, please get all Hospital records sent to your Prim MD by signing hospital release before you go home.  Activity: As tolerated with Full fall precautions use walker/cane & assistance as needed   Diet:  Heart Healthy -Low Carb with full Assistance and Aspiration precautions.  For Heart failure patients - Check your Weight same time everyday, if you gain over 2 pounds, or you develop in leg swelling, experience more shortness of breath or chest pain, call your Primary MD immediately. Follow Cardiac Low Salt Diet and 1.8 lit/day fluid restriction.  Disposition SNF  If you experience worsening of your admission symptoms, develop shortness of breath,  life threatening emergency, suicidal or homicidal thoughts you must seek medical attention immediately by calling 911 or calling your MD immediately  if symptoms less severe.  You Must read complete instructions/literature along with all the possible adverse reactions/side effects for all the Medicines you take and that have been prescribed to you. Take any new Medicines after you have completely understood and accpet all the possible adverse reactions/side effects.      Follow-up Information   Follow up with Illene Regulus, MD. Schedule an appointment as soon as possible for a visit in 4 days.   Contact information:   [address] [place] [address] [phone]       Follow up with [facility],MD, MD. Schedule an appointment as soon as possible for a visit in 1 week.   Contact information:   [address] [place] [address] [phone]       Follow up with Lina Sar, MD. Schedule an appointment as soon as possible for a visit in 1 week.   Contact information:   [address] [place] [address] [phone]         Discharge Medications     Medication List    STOP taking these medications       pravastatin 80 MG tablet  Commonly known as:  PRAVACHOL      TAKE these medications       allopurinol 100 MG tablet  Commonly known as:  ZYLOPRIM  Take 2 tablets (200 mg total) by mouth daily.  amLODipine 5 MG tablet  Commonly known as:  NORVASC  Take 5 mg by mouth daily.     amoxicillin-clavulanate 500-125 MG per tablet  Commonly known as:  AUGMENTIN  Take 1 tablet (500 mg total) by mouth every 12 (twelve) hours. 6  More days     aspirin 81 MG tablet  Take 81 mg by mouth daily.     atropine 1 % ophthalmic solution  Place 1 drop into the left eye 2 (two) times daily.     cholecalciferol 1000 UNITS tablet  Commonly known as:  VITAMIN D  Take 1 tablet (1,000 Units total) by mouth daily.     citalopram 20 MG tablet  Commonly known  as:  CELEXA  Take 20 mg by mouth daily.     colchicine 0.6 MG tablet  Take 1 tablet (0.6 mg total) by mouth as needed. Takes 2 tablets daily as needed for gout flareup.     diazepam 5 MG tablet  Commonly known as:  VALIUM  Take 1 tablet (5 mg total) by mouth every 12 (twelve) hours as needed for anxiety or sleep.     dorzolamide-timolol 22.3-6.8 MG/ML ophthalmic solution  Commonly known as:  COSOPT  Place 1 drop into both eyes 2 (two) times daily.     ergocalciferol 50000 UNITS capsule  Commonly known as:  VITAMIN D2  Take 50,000 Units by mouth once a week.     furosemide 80 MG tablet  Commonly known as:  LASIX  Take 0.5 tablets (40 mg total) by mouth daily.     gabapentin 600 MG tablet  Commonly known as:  NEURONTIN  Take 1 tablet (600 mg total) by mouth 3 (three) times daily.     hydrALAZINE 100 MG tablet  Commonly known as:  APRESOLINE  Take 100 mg by mouth 3 (three) times daily.     insulin glargine 100 UNIT/ML injection  Commonly known as:  LANTUS  Inject 6 Units into the skin at bedtime.     insulin lispro 100 UNIT/ML injection  Commonly known as:  HUMALOG  Inject 4 Units into the skin 3 (three) times daily before meals.     LUMIGAN 0.03 % ophthalmic solution  Generic drug:  bimatoprost  Place 1 drop into the left eye daily.     metoprolol tartrate 25 MG tablet  Commonly known as:  LOPRESSOR  Take 75 mg by mouth 2 (two) times daily.     nitroGLYCERIN 0.4 MG SL tablet  Commonly known as:  NITROSTAT  Place 0.4 mg under the tongue as needed. For chest pain.     ondansetron 4 MG tablet  Commonly known as:  ZOFRAN  Take 1 tablet (4 mg total) by mouth every 8 (eight) hours as needed for nausea.     pantoprazole 40 MG tablet  Commonly known as:  PROTONIX  Take 40 mg by mouth every morning.     potassium chloride 10 MEQ tablet  Commonly known as:  K-DUR  Take 2 tablets (20 mEq total) by mouth daily.     saccharomyces boulardii 250 MG capsule  Commonly  known as:  FLORASTOR  Take 1 capsule (250 mg total) by mouth 2 (two) times daily.     sodium chloride 5 % ophthalmic solution  Commonly known as:  MURO 128  Place 1 drop into the left eye 2 (two) times daily.     sulfamethoxazole-trimethoprim 800-160 MG per tablet  Commonly known as:  BACTRIM DS  Take 1  tablet by mouth every 12 (twelve) hours. 6 more days           Total Time in preparing paper work, data evaluation and todays exam - 35 minutes  Leroy Sea M.D on 03/05/2013 at 9:26 AM  [facility] Office  [phone]

## 2013-03-05 NOTE — Progress Notes (Signed)
Date: 03/05/2013  MRN:  [id number] Name:  [name] Sex:  female Age:  79 y.o. DOB:[birth year]                         Facility/Room;[facility] Level Of Care: Provider: Dr. Murray Hodgkins  Emergency Contacts: Contact Information   Name Relation Home Work [place] Daughter [phone]       Code Status:DNR MOST Form:  Allergies:No Known Allergies   Chief Complaint  Patient presents with  . Medical Managment of Chronic Issues    New admit to SNF following hospitalization for abdominal pain     HPI:79 y.o. female who presented 02/24/2013 to the ED with ongoing decline gradually and progressively occuring over the past 4 weeks after being [facility] to live at home with her son. She has had a slow mental and physical decline since coming home with decreasing appetite, decreasing mobility, increasing generalized weakness and confusion. Over the past couple of days however she has had marked decline with diarrhea, and then nausea and vomiting beginning this morning. In the ED she was noted to have a tender abdomen, lab work revealed a lipse of 1200, and AKI with creatinine 2.89 (baseline 1.7). She was given 2 runs of IV potassium for K 2.7, and hospitalist has been asked to admit for apparent acute pancreatitis.79 y.o. female, patient with history of is on insulin, chronic kidney disease stage IV baseline creatinine 1.5, dyslipidemia, advanced deconditioning, decubitus ulcer upon admission noted on the ER workup, perianal abscess status post drainage by general surgery this admission, she was admitted to the hospital with decreased appetite, decreased mobility generalized weakness, also with weight abdominal discomfort, the ER workup was suggestive of acute pancreatitis and decubitus ulcer on CT scan, she also developed mild acute renal failure on chronic kidney disease stage IV, she was admitted to the hospital provided bowel rest, initially IV  fluids, her statin was stopped as this was thought to be her culprit for pancreatitis, she has had cholecystectomy in the past and her triglycerides were stable, with supportive care her abdominal discomfort resolved, her appetite is back and she is asymptomatic in terms of bowel discomfort after eating heart healthy carbohydrate limited diet. We recommended she follow with GI one time as outpatient for her pancreatitis episode. Patient discharged 03/05/2013  to [facility] for rehab.     Past Medical History  Diagnosis Date  . Coronary artery disease   . Renal artery stenosis   . Hypertension   . Dyslipidemia   . GERD (gastroesophageal reflux disease)   . Tension headache   . Abdominal pain   . Anemia, unspecified   . Gout   . Back pain, chronic   . History of tonsillectomy   . History of cholecystectomy   . Depression   . IDDM (insulin dependent diabetes mellitus)   . Diabetes mellitus   . CHF (congestive heart failure)     Past Surgical History  Procedure Laterality Date  . Tonsillectomy    . Cholecystectomy  40 years ago     Procedures:   02/24/2013  CT ABDOMEN AND PELVIS WITHOUT CONTRAST  . IMPRESSION: Colonic diverticulosis without overt diverticulitis. Bilateral subcutaneous fat stranding posteriorly may reflect decubitus ulcers. Recommend direct inspection.   02/24/2013  CHEST. IMPRESSION: Cardiomegaly, vascular congestion.   03/01/2013 PORTABLE CHEST  IMPRESSION: Probable mild CHF.  03/03/2013 . ABDOMINAL ULTRASOUND IMPRESSION: 1. No acute findings. 2. Prior cholecystectomy.  Consultants:  Dr. Christella Noa PCP  Current Outpatient Prescriptions  Medication Sig Dispense Refill  . allopurinol (ZYLOPRIM) 100 MG tablet Take 2 tablets (200 mg total) by mouth daily.      [facility] Kitchen amLODipine (NORVASC) 5 MG tablet Take 5 mg by mouth daily.      [facility] Kitchen amoxicillin-clavulanate (AUGMENTIN) 500-125 MG per tablet Take 1 tablet (500 mg total) by mouth every 12 (twelve) hours. 6  More days       . aspirin 81 MG tablet Take 81 mg by mouth daily.        Mar[place] Kitchen atropine 1 % ophthalmic solution Place 1 drop into the left eye 2 (two) times daily.      . bimatoprost (LUMIGAN) 0.03 % ophthalmic solution Place 1 drop into the left eye daily.       . cholecalciferol (VITAMIN D) 1000 UNITS tablet Take 1 tablet (1,000 Units total) by mouth daily.  100 tablet  3  . citalopram (CELEXA) 20 MG tablet Take 20 mg by mouth daily.        . colchicine 0.6 MG tablet Take 1 tablet (0.6 mg total) by mouth as needed. Takes 2 tablets daily as needed for gout flareup.      . diazepam (VALIUM) 5 MG tablet Tablet every 12 hours as needed  60 tablet  5  . dorzolamide-timolol (COSOPT) 22.3-6.8 MG/ML ophthalmic solution Place 1 drop into both eyes 2 (two) times daily.      . ergocalciferol (VITAMIN D2) 50000 UNITS capsule Take 50,000 Units by mouth once a week.        . furosemide (LASIX) 80 MG tablet Take 0.5 tablets (40 mg total) by mouth daily.      Marland Kitchen gabapentin (NEURONTIN) 600 MG tablet Take 1 tablet (600 mg total) by mouth 3 (three) times daily.  90 tablet  2  . hydrALAZINE (APRESOLINE) 100 MG tablet Take 100 mg by mouth 3 (three) times daily.      . insulin glargine (LANTUS) 100 UNIT/ML injection Inject 6 Units into the skin at bedtime.      . insulin lispro (HUMALOG) 100 UNIT/ML injection Inject 4 Units into the skin 3 (three) times daily before meals.      . metoprolol tartrate (LOPRESSOR) 25 MG tablet Take 75 mg by mouth 2 (two) times daily.      . nitroGLYCERIN (NITROSTAT) 0.4 MG SL tablet Place 0.4 mg under the tongue as needed. For chest pain.       Mar[place] Kitchen ondansetron (ZOFRAN) 4 MG tablet Take 1 tablet (4 mg total) by mouth every 8 (eight) hours as needed for nausea.  30 tablet  0  . pantoprazole (PROTONIX) 40 MG tablet Take 40 mg by mouth every morning.      . potassium chloride (K-DUR) 10 MEQ tablet Take 2 tablets (20 mEq total) by mouth daily.  30 tablet    . saccharomyces boulardii (FLORASTOR) 250 MG capsule  Take 1 capsule (250 mg total) by mouth 2 (two) times daily.      . sodium chloride (MURO 128) 5 % ophthalmic solution Place 1 drop into the left eye 2 (two) times daily.      Marland Kitchen sulfamethoxazole-trimethoprim (BACTRIM DS) 800-160 MG per tablet Take 1 tablet by mouth every 12 (twelve) hours. 6 more days       No current facility-administered medications for this visit.    Immunization History  Administered Date(s) Administered  . Influenza Whole 07/18/2009  . Pneumococcal Polysaccharide 06/15/2008  .  Td 06/15/2008     Diet:  History  Substance Use Topics  . Smoking status: Never Smoker   . Smokeless tobacco: Not on file  . Alcohol Use: No    Family History  Problem Relation Age of Onset  . Coronary artery disease Father   . Hypertension Father   . Heart attack Father   . Hyperlipidemia Father   . Diabetes Sister   . Hypertension Sister   . Hyperlipidemia Sister   . Hypertension Brother   . Diabetes Brother   . Hyperlipidemia Brother   . Cancer Neg Hx     Breast and colon       Vital signs: BP 113/54  Pulse 60  Temp(Src) 97.7 F (36.5 C) (Oral)  Resp 18  Ht 5\' 3"  (1.6 m)  Wt 181 lb (82.101 kg)  BMI 32.07 kg/m2   General Appearance:    Alert, cooperative, no distress, appears stated age  Head:    Normocephalic, without obvious abnormality, atraumatic  Eyes:    PERRL, conjunctiva/corneas clear, EOM's intact, fundi    benign, both eyes  Ears:    Normal TM's and external ear canals, both ears  Nose:   Nares normal, septum midline, mucosa normal, no drainage    or sinus tenderness  Throat:   Lips, mucosa, and tongue normal; teeth and gums normal  Neck:   Supple, symmetrical, trachea midline, no adenopathy;    thyroid:  no enlargement/tenderness/nodules; no carotid   bruit or JVD  Back:     Symmetric, no curvature, ROM normal, no CVA tenderness  Lungs:     Clear to auscultation bilaterally, respirations unlabored  Chest Wall:    No tenderness or deformity    Heart:    Regular rate and rhythm, S1 and S2 normal, no murmur, rub   or gallop  Breast Exam:    No tenderness, masses, or nipple abnormality  Abdomen:     Soft, non-tender, bowel sounds active all four quadrants,    no masses, no organomegaly  Genitalia:    Normal female without lesion, discharge or tenderness  Rectal:    Normal tone, normal prostate, no masses or tenderness;   guaiac negative stool  Extremities:   Extremities normal, atraumatic, no cyanosis or edema  Pulses:   2+ and symmetric all extremities  Skin:   Skin color, texture, turgor normal, no rashes or lesions  Lymph nodes:   Cervical, supraclavicular, and axillary nodes normal  Neurologic:   CNII-XII intact, normal strength, sensation and reflexes    throughout    Screening Score  MMS    PHQ2    PHQ9     Fall Risk    BIMS     Admission on 02/24/2013, Discharged on 03/05/2013  No results displayed because visit has over 200 results.       Annual summary: Hospitalizations:  Infection History:  Functional assessment: Areas of potential improvement: Rehabilitation Potential: Prognosis for survival: Plan:

## 2013-03-05 NOTE — Progress Notes (Signed)
Discussed with MD,PA. Patient left for SNIF before I was able to get by to see her

## 2013-03-05 NOTE — Progress Notes (Signed)
Note patient was seen on 03/04/2013, however note was not saved in error, case had been discussed in detail with general surgery Dr. Jamey Ripa and she underwent perianal abscess I&D yesterday bedside, her abdominal pain and discomfort had already resolved, her renal function had improved.

## 2013-03-05 NOTE — Progress Notes (Signed)
Antibiotic Consults  48 yof admitted 5/14 with c/o of diarrhea and vomiting, anorexia, HA, chills, progressive weakness, physical and mental decline after discharge from [facility] on 01/27/13. Dx = pancreatitis, ARF, AMS. On 5/21 wound care nurse documented pressure ulcers on buttocks, and boil-like lesion distal to pressure ulcer. Vancomycin and Zosyn was started 5/21.  Pt underwent I&D 5/22 and today MD ordered for pharmacy to D/C Vanc and Zosyn and start Augmentin and Septra at 10:00 AM.    Patient is afebrile, WBC up slightly to 12.4K.  Scr up today to 1.53 for CG CrCl of 28 ml/min.  Wound culture on 5/20 and 5/21 still re-incubated for better growth.   Plan: Given current renal function along with coverage needed - will stop Vanc and Zosyn now as all doses are due before 10:00 today.  Start Bactrim DS 800/160 mg 1 tablet q12h and Augmentin 500/125 mg po q12h.  Pharmacy will f/u and adjust per renal fxn and culture if patient still here.  Geoffry Paradise, PharmD, BCPS Pager: [phone] 7:57 AM Pharmacy #: [phone]

## 2013-03-05 NOTE — Progress Notes (Signed)
Patient discharged to [facility] via ambulance. Patient discharge packet prepared by CSW and given to EMS transporter for SNF. See wound care under assessment, dressing change done prior to discharge. Patient alert, no sign of distress noted.

## 2013-03-05 NOTE — Progress Notes (Signed)
Subjective: Pt feeling okay, denies pain in buttock.  Pt confused this morning to place and time, some understandable speech.  Tolerating diet, No BM since 19th.  Working with PT/OT.  Tolerating dressing changes at bedside.    Objective: Vital signs in last 24 hours: Temp:  [98.4 F (36.9 C)-98.9 F (37.2 C)] 98.4 F (36.9 C) (05/23 0510) Pulse Rate:  [62-70] 70 (05/23 0510) Resp:  [16-20] 20 (05/23 0510) BP: (120-128)/(49-59) 128/59 mmHg (05/23 0510) SpO2:  [94 %-97 %] 97 % (05/23 0510) Weight:  [82.192 kg (181 lb 3.2 oz)] 82.192 kg (181 lb 3.2 oz) (05/23 0500) Last BM Date: 03/01/13  Intake/Output from previous day: 05/22 0701 - 05/23 0700 In: 550 [P.O.:300; IV Piggyback:250] Out: 750 [Urine:750] Intake/Output this shift:    PE: Gen:  Sleeping, but easily aroused, confused, NAD, pleasant Decubitus ulcer:  Appears clean, some bleeding, no necrotic tissue/slough, some ttp, replaced wet to dry dressing Perirectal abscess:  Still draining thick foul smelling purulent drainage, replaced iodoform gauze since I was unable to get 1/4in continuous packing strips without iodine.     Lab Results:   Recent Labs  03/05/13 0453  WBC 12.4*  HGB 9.0*  HCT 27.3*  PLT 180   BMET  Recent Labs  03/04/13 0544 03/05/13 0453  NA 135 135  K 4.1 4.4  CL 100 100  CO2 26 26  GLUCOSE 149* 140*  BUN 28* 38*  CREATININE 1.20* 1.53*  CALCIUM 9.3 9.6   PT/INR No results found for this basename: LABPROT, INR,  in the last 72 hours CMP     Component Value Date/Time   NA 135 03/05/2013 0453   K 4.4 03/05/2013 0453   CL 100 03/05/2013 0453   CO2 26 03/05/2013 0453   GLUCOSE 140* 03/05/2013 0453   BUN 38* 03/05/2013 0453   CREATININE 1.53* 03/05/2013 0453   CALCIUM 9.6 03/05/2013 0453   PROT 6.0 03/05/2013 0453   ALBUMIN 2.3* 03/05/2013 0453   AST 13 03/05/2013 0453   ALT 11 03/05/2013 0453   ALKPHOS 104 03/05/2013 0453   BILITOT 0.4 03/05/2013 0453   GFRNONAA 30* 03/05/2013 0453   GFRAA  35* 03/05/2013 0453   Lipase     Component Value Date/Time   LIPASE 603* 03/05/2013 0453       Studies/Results: US Abdomen Complete  03/03/2013   *RADIOLOGY REPORT*  Clinical Data:  Pancreatitis.  ABDOMINAL ULTRASOUND COMPLETE  Comparison:  02/24/2013  Findings:  Gallbladder:  Previous cholecystectomy  Common Bile Duct:  Within normal limits in caliber.  Liver: No focal mass lesion identified.  Within normal limits in parenchymal echogenicity.  IVC:  Appears normal.  Pancreas:  Diminished exam detail.No abnormality identified.  Spleen:  Within normal limits in size and echotexture.  Right kidney:  Irregular and lobular contour measuring 9.4 cm. Normal in parenchymal echogenicity.  No evidence of mass or hydronephrosis.  Left kidney:  Irregular and lobular contour measuring 9.9 cm. Normal in parenchymal echogenicity.  No evidence of mass or hydronephrosis.  Abdominal Aorta:  No aneurysm identified.  IMPRESSION:  1.  No acute findings. 2.  Prior cholecystectomy.   Original Report Authenticated By: Signa Kell, M.D.    Anti-infectives: Anti-infectives   Start     Dose/Rate Route Frequency Ordered Stop   03/04/13 1800  vancomycin (VANCOCIN) IVPB 750 mg/150 ml premix  Status:  Discontinued     750 mg 150 mL/hr over 60 Minutes Intravenous Every 24 hours 03/03/13 1701 03/05/13 0747  03/03/13 1700  vancomycin (VANCOCIN) IVPB 1000 mg/200 mL premix  Status:  Discontinued     1,000 mg 200 mL/hr over 60 Minutes Intravenous Every 24 hours 03/03/13 1632 03/03/13 1659   03/03/13 1700  vancomycin (VANCOCIN) IVPB 1000 mg/200 mL premix     1,000 mg 200 mL/hr over 60 Minutes Intravenous  Once 03/03/13 1701 03/03/13 1828   03/03/13 1630  piperacillin-tazobactam (ZOSYN) IVPB 3.375 g  Status:  Discontinued     3.375 g 12.5 mL/hr over 240 Minutes Intravenous Every 8 hours 03/03/13 1611 03/05/13 0747       Assessment/Plan Decubitus Ulcer  -Much cleaner appearing -BID wet to dry dressing changes.   -Optimize nutrition and mobility to aid with healing.   Infected Perirectal Abscess  -TID dressing changes. Needs to be monitored for bleeding. -Continue with abx and await final culture report  -Will continue to follow patient, as an outpatient, pt's SNF [facility]) should call to verify appt time     WOUND CARE INSTRUCTIONS: 1.  PLEASE CHANGE THE PERIRECTAL ABSCESS WITH 1/4INCH CONTINUOUS PACKING STRIPS TID OR MORE OFTEN FOR SOILING UNTIL WOUND IS SUPERFICIAL ENOUGH TO PACK WITH ONLY WET TO DRY'S (wound bed approximately 3-10mm deep).  THE WOUND IS CURRENTLY 2.5-3CM DEEP.  PLEASE ENSURE ABSCESS CAVITY IS PACKED WELL.  CURRENTLY THERE IS STILL PURULENT DRAINAGE.  2.  PACK DECUBITUS ULCER WITH WET TO DRY DRESSINGS BID  3.  PLACE DRY ABD OVER BOTH SITES AND TRY TO USE MINIMAL TAPE ON THE SKIN, RECOMMEND USING MESH PANTY'S TO PREVENT SKIN BREAKDOWN.    LOS: 9 days    DORT, Romonia Yanik 03/05/2013, 7:48 AM Pager: [phone]

## 2013-03-06 LAB — WOUND CULTURE: Gram Stain: NONE SEEN

## 2013-03-16 ENCOUNTER — Encounter (INDEPENDENT_AMBULATORY_CARE_PROVIDER_SITE_OTHER): Payer: Medicare Other

## 2013-03-19 NOTE — Progress Notes (Signed)
Date: 03/19/2013  MRN:  [id number] Name:  [name] Sex:  female Age:  79 y.o. DOB:Jul 16, 1928                         Facility/Room; [facility] Level Of Care:SNF Provider: Dr. Murray Hodgkins  Emergency Contacts: Contact Information   Name Relation Home Work [place] Daughter [phone]       Code Status:DNR MOST Form:  Allergies:No Known Allergies   Chief Complaint  Patient presents with  . Medical Managment of Chronic Issues    Re-admit to SNF following hospitalization for acute pancreatitis     HPI: 79 y.o. female who presents to the ED 02/24/13,  with ongoing decline gradually and progressively occuring over the past 4 weeks after being released from [facility] to live at home with her son. She has had a slow mental and physical decline since coming home with decreasing appetite, decreasing mobility, increasing generalized weakness and confusion. Over the past couple of days however she has had marked decline with diarrhea, and then nausea and vomiting beginning this morning. The patient is not really able to provide much additional history due to AMS.  In the ED she was noted to have a tender abdomen, lab work revealed a lipse of 1200, and AKI with creatinine 2.89 (baseline 1.7). She was given 2 runs of IV potassium for K 2.7, and hospitalist has been asked to admit for apparent acute pancreatitis. Upon admission in the ER her CT scan revealed evidence of decubitus ulcer, upon evaluation she had a stage IV decubitus ulcer on her back along with perianal abscess, she was seen by general surgery she underwent incision and drainage bedside and tolerated the procedure well, she has no remaining signs of drainage or cellulitis, she will be given a few more days of oral antibiotics i.e. 6 more days of Augmentin and Bactrim. She will require continued wound care, skin care per protocol. One week followup with [facility] post discharge.   Recommended she follow with GI one time as outpatient for her pancreatitis episode. Patient was discharge to [facility] for rehab.    Past Medical History  Diagnosis Date  . Coronary artery disease   . Renal artery stenosis   . Hypertension   . Dyslipidemia   . GERD (gastroesophageal reflux disease)   . Tension headache   . Abdominal pain   . Anemia, unspecified   . Gout   . Back pain, chronic   . History of tonsillectomy   . History of cholecystectomy   . Depression   . IDDM (insulin dependent diabetes mellitus)   . Diabetes mellitus   . CHF (congestive heart failure)     Past Surgical History  Procedure Laterality Date  . Tonsillectomy    . Cholecystectomy  40 years ago     Procedures:   02/24/2013  CT ABDOMEN AND PELVIS WITHOUT CONTRAST  IMPRESSION: Colonic diverticulosis without overt diverticulitis. Bilateral subcutaneous fat stranding posteriorly may reflect decubitus ulcers. Recommend direct inspection.  02/24/2013 . CHEST - 2 VIEW . IMPRESSION: Cardiomegaly, vascular congestion. 03/03/2013. ABDOMINAL ULTRASOUND COMPLETE IMPRESSION: 1. No acute findings. 2. Prior cholecystectomy.   03/01/2013  PORTABLE CHEST - 1 VIEW  IMPRESSION: Probable mild CHF.    Consultants:  Dr. Christella Noa PCP  Dr. Lina Sar  Current Outpatient Prescriptions  Medication Sig Dispense Refill  . allopurinol (ZYLOPRIM) 100 MG tablet Take 2 tablets (200 mg total) by mouth  daily.      . amLODipine (NORVASC) 5 MG tablet Take 5 mg by mouth daily.      Marland Kitchen amoxicillin-clavulanate (AUGMENTIN) 500-125 MG per tablet Take 1 tablet (500 mg total) by mouth every 12 (twelve) hours. 6  More days      . aspirin 81 MG tablet Take 81 mg by mouth daily.        Marland Kitchen atropine 1 % ophthalmic solution Place 1 drop into the left eye 2 (two) times daily.      . bimatoprost (LUMIGAN) 0.03 % ophthalmic solution Place 1 drop into the left eye daily.       . cholecalciferol (VITAMIN D) 1000 UNITS tablet Take 1 tablet  (1,000 Units total) by mouth daily.  100 tablet  3  . citalopram (CELEXA) 20 MG tablet Take 20 mg by mouth daily.        . colchicine 0.6 MG tablet Take 1 tablet (0.6 mg total) by mouth as needed. Takes 2 tablets daily as needed for gout flareup.      . diazepam (VALIUM) 5 MG tablet Tablet every 12 hours as needed  60 tablet  5  . dorzolamide-timolol (COSOPT) 22.3-6.8 MG/ML ophthalmic solution Place 1 drop into both eyes 2 (two) times daily.      . ergocalciferol (VITAMIN D2) 50000 UNITS capsule Take 50,000 Units by mouth once a week.        . furosemide (LASIX) 80 MG tablet Take 0.5 tablets (40 mg total) by mouth daily.      Marland Kitchen gabapentin (NEURONTIN) 600 MG tablet Take 1 tablet (600 mg total) by mouth 3 (three) times daily.  90 tablet  2  . hydrALAZINE (APRESOLINE) 100 MG tablet Take 100 mg by mouth 3 (three) times daily.      . insulin glargine (LANTUS) 100 UNIT/ML injection Inject 6 Units into the skin at bedtime.      . insulin lispro (HUMALOG) 100 UNIT/ML injection Inject 4 Units into the skin 3 (three) times daily before meals.      . metoprolol tartrate (LOPRESSOR) 25 MG tablet Take 75 mg by mouth 2 (two) times daily.      . nitroGLYCERIN (NITROSTAT) 0.4 MG SL tablet Place 0.4 mg under the tongue as needed. For chest pain.       Marland Kitchen ondansetron (ZOFRAN) 4 MG tablet Take 1 tablet (4 mg total) by mouth every 8 (eight) hours as needed for nausea.  30 tablet  0  . pantoprazole (PROTONIX) 40 MG tablet Take 40 mg by mouth every morning.      . potassium chloride (K-DUR) 10 MEQ tablet Take 2 tablets (20 mEq total) by mouth daily.  30 tablet    . saccharomyces boulardii (FLORASTOR) 250 MG capsule Take 1 capsule (250 mg total) by mouth 2 (two) times daily.      . sodium chloride (MURO 128) 5 % ophthalmic solution Place 1 drop into the left eye 2 (two) times daily.      Marland Kitchen sulfamethoxazole-trimethoprim (BACTRIM DS) 800-160 MG per tablet Take 1 tablet by mouth every 12 (twelve) hours. 6 more days        No current facility-administered medications for this visit.    Immunization History  Administered Date(s) Administered  . Influenza Whole 07/18/2009  . Pneumococcal Polysaccharide 06/15/2008  . Td 06/15/2008     Diet:  History  Substance Use Topics  . Smoking status: Never Smoker   . Smokeless tobacco: Not on file  . Alcohol  Use: No    Family History  Problem Relation Age of Onset  . Coronary artery disease Father   . Hypertension Father   . Heart attack Father   . Hyperlipidemia Father   . Diabetes Sister   . Hypertension Sister   . Hyperlipidemia Sister   . Hypertension Brother   . Diabetes Brother   . Hyperlipidemia Brother   . Cancer Neg Hx     Breast and colon       Vital signs: BP 114/56  Pulse 56  Resp 20  Ht 5' (1.524 m)  Wt 185 lb (83.915 kg)  BMI 36.13 kg/m2    General Appearance:    Alert, cooperative, no distress, appears stated age  Head:    Normocephalic, without obvious abnormality, atraumatic  Eyes:    PERRL, conjunctiva/corneas clear, EOM's intact, fundi    benign, both eyes  Ears:    Normal TM's and external ear canals, both ears  Nose:   Nares normal, septum midline, mucosa normal, no drainage    or sinus tenderness  Throat:   Lips, mucosa, and tongue normal; teeth and gums normal  Neck:   Supple, symmetrical, trachea midline, no adenopathy;    thyroid:  no enlargement/tenderness/nodules; no carotid   bruit or JVD  Back:     Symmetric, no curvature, ROM normal, no CVA tenderness  Lungs:     Clear to auscultation bilaterally, respirations unlabored  Chest Wall:    No tenderness or deformity   Heart:    Regular rate and rhythm, S1 and S2 normal, no murmur, rub   or gallop  Breast Exam:    No tenderness, masses, or nipple abnormality  Abdomen:     Soft, non-tender, bowel sounds active all four quadrants,    no masses, no organomegaly  Genitalia:    Normal female without lesion, discharge or tenderness  Rectal:    Normal tone,  normal prostate, no masses or tenderness;   guaiac negative stool  Extremities:   Extremities normal, atraumatic, no cyanosis or edema  Pulses:   2+ and symmetric all extremities  Skin:   Skin color, texture, turgor normal, no rashes or lesions  Lymph nodes:   Cervical, supraclavicular, and axillary nodes normal  Neurologic:   CNII-XII intact, normal strength, sensation and reflexes    throughout     Screening Score  MMS    PHQ2    PHQ9     Fall Risk    BIMS   Micro Results  Recent Results (from the past 240 hour(s))   CLOSTRIDIUM DIFFICILE BY PCR Status: None    Collection Time    02/27/13 4:18 PM   Result  Value  Range  Status    C difficile by pcr  NEGATIVE  NEGATIVE  Final   WOUND CULTURE Status: None    Collection Time    03/02/13 4:54 PM   Result  Value  Range  Status    Specimen Description  SACRAL   Final    Special Requests  Immunocompromised   Final    Gram Stain    Final    Value:  ABUNDANT WBC PRESENT, PREDOMINANTLY PMN     NO SQUAMOUS EPITHELIAL CELLS SEEN     MODERATE GRAM POSITIVE COCCI     IN CLUSTERS FEW GRAM NEGATIVE RODS    Culture    Final    Value:  MODERATE STAPHYLOCOCCUS AUREUS     Note: RIFAMPIN AND GENTAMICIN SHOULD NOT BE USED AS SINGLE  DRUGS FOR TREATMENT OF STAPH INFECTIONS.    Report Status  PENDING   Incomplete   WOUND CULTURE Status: None    Collection Time    03/03/13 4:33 PM   Result  Value  Range  Status    Specimen Description  PERIRECTAL   Final    Special Requests  NONE   Final    Gram Stain    Final    Value:  NO WBC SEEN     NO SQUAMOUS EPITHELIAL CELLS SEEN     FEW GRAM POSITIVE COCCI     IN CLUSTERS    Culture    Final    Value:  ABUNDANT STAPHYLOCOCCUS AUREUS     Note: RIFAMPIN AND GENTAMICIN SHOULD NOT BE USED AS SINGLE DRUGS FOR TREATMENT OF STAPH INFECTIONS.    Report Status  PENDING   Incomplete    CBC w Diff: Lab Results   Component  Value  Date    WBC  12.4*  03/05/2013    HGB  9.0*  03/05/2013    HCT  27.3*   03/05/2013    PLT  180  03/05/2013    LYMPHOPCT  4*  02/24/2013    MONOPCT  6  02/24/2013    EOSPCT  0  02/24/2013    BASOPCT  0  02/24/2013    CMP: Lab Results   Component  Value  Date    NA  135  03/05/2013    K  4.4  03/05/2013    CL  100  03/05/2013    CO2  26  03/05/2013    BUN  38*  03/05/2013    CREATININE  1.53*  03/05/2013    PROT  6.0  03/05/2013    ALBUMIN  2.3*  03/05/2013    BILITOT  0.4  03/05/2013    ALKPHOS  104  03/05/2013    AST  13  03/05/2013    ALT  11  03/05/2013       Annual summary: Hospitalizations:   Infection History:  Functional assessment: Areas of potential improvement: Rehabilitation Potential: Prognosis for survival: Plan:  This encounter was created in error - please disregard.

## 2013-03-22 ENCOUNTER — Ambulatory Visit: Payer: Medicare Other | Admitting: Internal Medicine

## 2013-03-23 ENCOUNTER — Encounter (INDEPENDENT_AMBULATORY_CARE_PROVIDER_SITE_OTHER): Payer: Medicare Other

## 2013-03-23 ENCOUNTER — Other Ambulatory Visit: Payer: Self-pay | Admitting: Internal Medicine

## 2013-03-31 ENCOUNTER — Ambulatory Visit: Payer: Medicare Other | Admitting: Internal Medicine

## 2013-04-09 DIAGNOSIS — L89309 Pressure ulcer of unspecified buttock, unspecified stage: Secondary | ICD-10-CM

## 2013-04-13 ENCOUNTER — Telehealth: Payer: Self-pay

## 2013-04-13 NOTE — Telephone Encounter (Signed)
Phone call from [place], nurse with [facility] [phone]. She states patient was hospitalized for acute pancreatitis in May and went to [facility] for 4 weeks and was d/c on 6/27 with home health orders for nursing for wound on sacral. Roxanne states when she assessed the patient the wound was more on the right side of her buttocks stage 2. She would like an order for duoderm and hydrogel and to changes twice a week as needed. Also an order for PT for strengthening. Please advise. thanks

## 2013-04-14 NOTE — Telephone Encounter (Signed)
Left a message for Roxanne at [employer] [phone] that it's okay for wound care as requested and okay for PT evaluation and treat

## 2013-04-14 NOTE — Telephone Encounter (Signed)
Ok for wound care as reqeusted and ok for PT eval and treat

## 2013-04-20 ENCOUNTER — Ambulatory Visit: Payer: Medicare Other | Admitting: Internal Medicine

## 2013-04-20 DIAGNOSIS — Z0289 Encounter for other administrative examinations: Secondary | ICD-10-CM

## 2013-04-29 ENCOUNTER — Other Ambulatory Visit: Payer: Self-pay | Admitting: Geriatric Medicine

## 2013-05-04 ENCOUNTER — Other Ambulatory Visit: Payer: Self-pay | Admitting: Geriatric Medicine

## 2013-05-11 ENCOUNTER — Other Ambulatory Visit: Payer: Self-pay | Admitting: Geriatric Medicine

## 2013-05-11 MED ORDER — BIMATOPROST 0.03 % OP SOLN: 1.0000 [drp] | Freq: Every day | OPHTHALMIC | Status: DC

## 2013-05-17 ENCOUNTER — Other Ambulatory Visit: Payer: Self-pay | Admitting: Geriatric Medicine

## 2013-05-19 ENCOUNTER — Other Ambulatory Visit: Payer: Self-pay | Admitting: Internal Medicine

## 2013-06-03 ENCOUNTER — Ambulatory Visit (INDEPENDENT_AMBULATORY_CARE_PROVIDER_SITE_OTHER): Payer: Medicare Other

## 2013-06-03 ENCOUNTER — Ambulatory Visit: Payer: Medicare Other | Admitting: Endocrinology

## 2013-06-03 ENCOUNTER — Encounter: Payer: Self-pay | Admitting: Endocrinology

## 2013-06-03 VITALS — BP 124/80 | HR 81 | Temp 98.4°F | Resp 12 | Ht 63.0 in | Wt 180.9 lb

## 2013-06-03 DIAGNOSIS — E785 Hyperlipidemia, unspecified: Secondary | ICD-10-CM

## 2013-06-03 LAB — COMPREHENSIVE METABOLIC PANEL
ALT: 10 U/L (ref 0–35)
BUN: 25 mg/dL — ABNORMAL HIGH (ref 6–23)
CO2: 23 mEq/L (ref 19–32)
Calcium: 9.8 mg/dL (ref 8.4–10.5)
Creatinine, Ser: 2 mg/dL — ABNORMAL HIGH (ref 0.4–1.2)
GFR: 30.23 mL/min — ABNORMAL LOW (ref >60.00)
Total Bilirubin: 0.5 mg/dL (ref 0.3–1.2)

## 2013-06-03 LAB — CBC WITH DIFFERENTIAL/PLATELET
Basophils Absolute: 0 10*3/uL (ref 0.0–0.1)
Basophils Relative: 0.5 % (ref 0.0–3.0)
HCT: 33.2 % — ABNORMAL LOW (ref 36.0–46.0)
Hemoglobin: 10.8 g/dL — ABNORMAL LOW (ref 12.0–15.0)
Lymphs Abs: 0.8 10*3/uL (ref 0.7–4.0)
MCHC: 32.3 g/dL (ref 30.0–36.0)
Monocytes Relative: 7 % (ref 3.0–12.0)
Neutro Abs: 5 10*3/uL (ref 1.4–7.7)
RBC: 3.62 Mil/uL — ABNORMAL LOW (ref 3.87–5.11)
RDW: 17.7 % — ABNORMAL HIGH (ref 11.5–14.6)

## 2013-06-03 LAB — LIPID PANEL
Cholesterol: 155 mg/dL (ref 0–200)
HDL: 35.2 mg/dL — ABNORMAL LOW (ref >39.00)
Total CHOL/HDL Ratio: 4
Triglycerides: 180 mg/dL — ABNORMAL HIGH (ref 0.0–149.0)

## 2013-06-03 LAB — HEMOGLOBIN A1C: Hgb A1c MFr Bld: 6.3 % (ref 4.6–6.5)

## 2013-06-04 LAB — MICROALBUMIN / CREATININE URINE RATIO: Microalb Creat Ratio: 1.9 mg/g (ref 0.0–30.0)

## 2013-06-08 ENCOUNTER — Telehealth: Payer: Self-pay | Admitting: Endocrinology

## 2013-06-10 ENCOUNTER — Other Ambulatory Visit: Payer: Self-pay | Admitting: *Deleted

## 2013-06-16 ENCOUNTER — Encounter: Payer: Self-pay | Admitting: Endocrinology

## 2013-06-16 ENCOUNTER — Other Ambulatory Visit: Payer: Self-pay | Admitting: *Deleted

## 2013-06-16 ENCOUNTER — Ambulatory Visit (INDEPENDENT_AMBULATORY_CARE_PROVIDER_SITE_OTHER): Payer: Medicare Other | Admitting: Endocrinology

## 2013-06-16 VITALS — BP 124/78 | HR 84 | Temp 97.9°F | Resp 12

## 2013-06-16 DIAGNOSIS — N289 Disorder of kidney and ureter, unspecified: Secondary | ICD-10-CM

## 2013-06-16 DIAGNOSIS — IMO0001 Reserved for inherently not codable concepts without codable children: Secondary | ICD-10-CM

## 2013-06-16 DIAGNOSIS — I10 Essential (primary) hypertension: Secondary | ICD-10-CM

## 2013-06-16 MED ORDER — GLUCOSE BLOOD VI STRP: ORAL_STRIP | Status: DC

## 2013-06-16 NOTE — Progress Notes (Signed)
Patient ID: [name], female   DOB: 1928-04-03, 79 y.o.   MRN: [id number]  [name] is an 79 y.o. female.   Reason for Appointment: Diabetes follow-up   History of Present Illness   Diagnosis: Type 2 DIABETES MELITUS, date of diagnosis:        Has been on insulin for several years for her diabetes and more recently requiring relatively low doses for coverage. Oral hypoglycemic drugs have been stopped because of renal insufficiency On her last visit in 2/14 her blood sugars were quite erratic even though her A1c was reasonably good at 7.8%  RECENT history The patient's diabetes control appears to be worse recently with most readings close to 200 and home average of about 200 On her last visit she was told to take her mealtime insulin consistently and she could take the insulin right after eating if desired Also difficult to certain her blood sugar pattern since she is checking blood sugars mostly between noon and 10 PM and also her meter has incorrect time set on it. The time was reset on this Insulin regimen: Lantus 4 units daily, Humalog 4 units before meals Proper timing of medications in relation to meals: Yes.         Monitors blood glucose: Once a day.    Glucometer:  FreeStyle .          Blood Glucose readings from meter download: Range 121-257 with unclear timings of her readings as her meter is off by about 14 hours. Earlier readings are consistently over 200 and has sporadic good readings about 6-7 hours later, recently on readings appear to be nearly 200  Hypoglycemia frequency:  none recently.          Meals: 3 meals per day. Sandwich at lunch, she thinks she is covering all meals with insulin      Physical activity:  none, currently in wheelchair            Last exam with ophthalmologist: 12/04/12  Wt Readings from Last 3 Encounters:  06/03/13 180 lb 14.4 oz (82.056 kg)  03/19/13 185 lb (83.915 kg)  03/05/13 181 lb (82.101 kg)    No visits with results within  1 Week(s) from this visit. Latest known visit with results is:  Office Visit on 06/03/2013  Component Date Value Range Status  . WBC 06/03/2013 6.4  4.5 - 10.5 K/uL Final  . RBC 06/03/2013 3.62* 3.87 - 5.11 Mil/uL Final  . Hemoglobin 06/03/2013 10.8* 12.0 - 15.0 g/dL Final  . HCT 14/78/2956 33.2* 36.0 - 46.0 % Final  . MCV 06/03/2013 91.9  78.0 - 100.0 fl Final  . MCHC 06/03/2013 32.3  30.0 - 36.0 g/dL Final  . RDW 21/30/8657 17.7* 11.5 - 14.6 % Final  . Platelets 06/03/2013 157.0  150.0 - 400.0 K/uL Final  . Neutrophils Relative % 06/03/2013 78.8* 43.0 - 77.0 % Final  . Lymphocytes Relative 06/03/2013 13.0  12.0 - 46.0 % Final  . Monocytes Relative 06/03/2013 7.0  3.0 - 12.0 % Final  . Eosinophils Relative 06/03/2013 0.7  0.0 - 5.0 % Final  . Basophils Relative 06/03/2013 0.5  0.0 - 3.0 % Final  . Neutro Abs 06/03/2013 5.0  1.4 - 7.7 K/uL Final  . Lymphs Abs 06/03/2013 0.8  0.7 - 4.0 K/uL Final  . Monocytes Absolute 06/03/2013 0.4  0.1 - 1.0 K/uL Final  . Eosinophils Absolute 06/03/2013 0.0  0.0 - 0.7 K/uL Final  . Basophils Absolute 06/03/2013  0.0  0.0 - 0.1 K/uL Final  . Sodium 06/03/2013 139  135 - 145 mEq/L Final  . Potassium 06/03/2013 4.1  3.5 - 5.1 mEq/L Final  . Chloride 06/03/2013 109  96 - 112 mEq/L Final  . CO2 06/03/2013 23  19 - 32 mEq/L Final  . Glucose, Bld 06/03/2013 140* 70 - 99 mg/dL Final  . BUN 16/07/9603 25* 6 - 23 mg/dL Final  . Creatinine, Ser 06/03/2013 2.0* 0.4 - 1.2 mg/dL Final  . Total Bilirubin 06/03/2013 0.5  0.3 - 1.2 mg/dL Final  . Alkaline Phosphatase 06/03/2013 91  39 - 117 U/L Final  . AST 06/03/2013 14  0 - 37 U/L Final  . ALT 06/03/2013 10  0 - 35 U/L Final  . Total Protein 06/03/2013 7.8  6.0 - 8.3 g/dL Final  . Albumin 54/06/8118 4.0  3.5 - 5.2 g/dL Final  . Calcium 14/78/2956 9.8  8.4 - 10.5 mg/dL Final  . GFR 21/30/8657 30.23* >60.00 mL/min Final  . Hemoglobin A1C 06/03/2013 6.3  4.6 - 6.5 % Final   Glycemic Control Guidelines for People  with Diabetes:Non Diabetic:  <6%Goal of Therapy: <7%Additional Action Suggested:  >8%   . Cholesterol 06/03/2013 155  0 - 200 mg/dL Final   ATP III Classification       Desirable:  < 200 mg/dL               Borderline High:  200 - 239 mg/dL          High:  > = 846 mg/dL  . Triglycerides 06/03/2013 180.0* 0.0 - 149.0 mg/dL Final   Normal:  <962 mg/dLBorderline High:  150 - 199 mg/dL  . HDL 06/03/2013 35.20* >39.00 mg/dL Final  . VLDL 95/28/4132 36.0  0.0 - 40.0 mg/dL Final  . LDL Cholesterol 06/03/2013 84  0 - 99 mg/dL Final  . Total CHOL/HDL Ratio 06/03/2013 4   Final                  Men          Women1/2 Average Risk     3.4          3.3Average Risk          5.0          4.42X Average Risk          9.6          7.13X Average Risk          15.0          11.0                      . Microalb, Ur 06/03/2013 1.8  0.0 - 1.9 mg/dL Final  . Creatinine,U 44/10/270 93.9   Final  . Microalb Creat Ratio 06/03/2013 1.9  0.0 - 30.0 mg/g Final      Medication List       This list is accurate as of: 06/16/13 11:59 PM.  Always use your most recent med list.               allopurinol 100 MG tablet  Commonly known as:  ZYLOPRIM  TAKE 2 TABLETS BY MOUTH EVERY DAY FOR GOUT     amLODipine 5 MG tablet  Commonly known as:  NORVASC  Take 5 mg by mouth daily.     amoxicillin-clavulanate 500-125 MG per tablet  Commonly known as:  AUGMENTIN  Take 1 tablet (500 mg total)  by mouth every 12 (twelve) hours. 6  More days     aspirin 81 MG tablet  Take 81 mg by mouth daily.     atropine 1 % ophthalmic solution  Place 1 drop into the left eye 2 (two) times daily.     bimatoprost 0.03 % ophthalmic solution  Commonly known as:  LUMIGAN  Place 1 drop into the left eye daily.     cholecalciferol 1000 UNITS tablet  Commonly known as:  VITAMIN D  Take 1 tablet (1,000 Units total) by mouth daily.     citalopram 20 MG tablet  Commonly known as:  CELEXA  TAKE 1 TABLET BY MOUTH EVERY DAY FOR MOOD      colchicine 0.6 MG tablet  Take 1 tablet (0.6 mg total) by mouth as needed. Takes 2 tablets daily as needed for gout flareup.     diazepam 5 MG tablet  Commonly known as:  VALIUM  Tablet every 12 hours as needed     dorzolamide-timolol 22.3-6.8 MG/ML ophthalmic solution  Commonly known as:  COSOPT  Place 1 drop into both eyes 2 (two) times daily.     ergocalciferol 50000 UNITS capsule  Commonly known as:  VITAMIN D2  Take 50,000 Units by mouth once a week.     furosemide 80 MG tablet  Commonly known as:  LASIX  Take 0.5 tablets (40 mg total) by mouth daily.     gabapentin 600 MG tablet  Commonly known as:  NEURONTIN  Take 1 tablet (600 mg total) by mouth 3 (three) times daily.     glucose blood test strip  Commonly known as:  ACCU-CHEK SMARTVIEW  Use as instructed to check blood sugars 2 times per day dx code 250.00     hydrALAZINE 100 MG tablet  Commonly known as:  APRESOLINE  Take 100 mg by mouth 3 (three) times daily.     insulin lispro 100 UNIT/ML injection  Commonly known as:  HUMALOG  Inject 4 Units into the skin 3 (three) times daily before meals.     LANTUS SOLOSTAR 100 UNIT/ML Sopn  Generic drug:  Insulin Glargine     metoprolol tartrate 25 MG tablet  Commonly known as:  LOPRESSOR  Take 75 mg by mouth 2 (two) times daily.     nitroGLYCERIN 0.4 MG SL tablet  Commonly known as:  NITROSTAT  Place 0.4 mg under the tongue as needed. For chest pain.     ondansetron 4 MG tablet  Commonly known as:  ZOFRAN  Take 1 tablet (4 mg total) by mouth every 8 (eight) hours as needed for nausea.     pantoprazole 40 MG tablet  Commonly known as:  PROTONIX  TAKE 1 TABLET BY MOUTH EVERY DAY FOR ACID REFLUX     potassium chloride 10 MEQ tablet  Commonly known as:  K-DUR  Take 2 tablets (20 mEq total) by mouth daily.     saccharomyces boulardii 250 MG capsule  Commonly known as:  FLORASTOR  Take 1 capsule (250 mg total) by mouth 2 (two) times daily.     sodium chloride  5 % ophthalmic solution  Commonly known as:  MURO 128  Place 1 drop into the left eye 2 (two) times daily.     sulfamethoxazole-trimethoprim 800-160 MG per tablet  Commonly known as:  BACTRIM DS  Take 1 tablet by mouth every 12 (twelve) hours. 6 more days        Allergies: No Known Allergies  Past  Medical History  Diagnosis Date  . Coronary artery disease   . Renal artery stenosis   . Hypertension   . Dyslipidemia   . GERD (gastroesophageal reflux disease)   . Tension headache   . Abdominal pain   . Anemia, unspecified   . Gout   . Back pain, chronic   . History of tonsillectomy   . History of cholecystectomy   . Depression   . IDDM (insulin dependent diabetes mellitus)   . Diabetes mellitus   . CHF (congestive heart failure)     Past Surgical History  Procedure Laterality Date  . Tonsillectomy    . Cholecystectomy  40 years ago    Family History  Problem Relation Age of Onset  . Coronary artery disease Father   . Hypertension Father   . Heart attack Father   . Hyperlipidemia Father   . Diabetes Sister   . Hypertension Sister   . Hyperlipidemia Sister   . Hypertension Brother   . Diabetes Brother   . Hyperlipidemia Brother   . Cancer Neg Hx     Breast and colon    Social History:  reports that she has never smoked. She does not have any smokeless tobacco history on file. She reports that she does not drink alcohol or use illicit drugs.  Review of Systems:  HYPERTENSION:  she is on multiple drugs for hypertension and blood pressure is relatively lower now  HYPERLIPIDEMIA: The lipid abnormality consists of elevated LDL and high triglycerides, previous LDL 77 with HDL 36 and triglycerides 187. Currently on ? Pravastatin     Examination:   BP 124/78  Pulse 84  Temp(Src) 97.9 F (36.6 C)  Resp 12  SpO2 96%  There is no weight on file to calculate BMI.   Trace ankle edema present  ASSESSMENT/ PLAN::   Diabetes type 2   The patient's diabetes  control appears to be worse recently with most readings close to 200 and home average of about 200 Also difficult to certain her blood sugar pattern since she is checking blood sugars mostly between noon and 10 PM and also her meter has incorrect time set on it. The time was reset on this today. Also diabetes management discussed with family member Will increase her Lantus to 6 units and continue 4 units of mealtime coverage She will check her blood sugar at various times of the day including fasting and postprandial, this was discussed with her and her family  CKD: Her creatinine is significantly higher and she has relatively lower blood pressure. She will reduce hydralazine to half tablet 3 times a day; she is already taking reduced dose of Lasix and may need to stop this completely Recommend that she be seen for this by a nephrologist, will defer to PCP  ANEMIA: Secondary to CKD, To be followed by PCP, probably asymptomatic from this  Shoulder pain: Advised her to call PCP for appointment  Counseling time over 50% of today's 25 minute visit  Dyan Labarbera 06/20/2013, 6:38 PM

## 2013-06-16 NOTE — Patient Instructions (Addendum)
Increase LANTUS TO 6 UNITS  Please check blood sugars at least half the time about 2 hours after any meal and as directed on waking up. Please bring blood sugar monitor to each visit  REDUCE Hydralazine to 1/2 of 100 mg, 3x daily

## 2013-06-18 ENCOUNTER — Other Ambulatory Visit: Payer: Self-pay | Admitting: Internal Medicine

## 2013-06-18 MED ORDER — ONDANSETRON HCL 4 MG PO TABS: 4.0000 mg | ORAL_TABLET | Freq: Three times a day (TID) | ORAL | Status: DC | PRN

## 2013-06-18 MED ORDER — DIAZEPAM 5 MG PO TABS: ORAL_TABLET | ORAL | Status: DC

## 2013-06-18 MED ORDER — POTASSIUM CHLORIDE ER 10 MEQ PO TBCR: 20.0000 meq | EXTENDED_RELEASE_TABLET | Freq: Every day | ORAL | Status: DC

## 2013-06-18 MED ORDER — COLCHICINE 0.6 MG PO TABS: 0.6000 mg | ORAL_TABLET | ORAL | Status: DC | PRN

## 2013-06-18 MED ORDER — CITALOPRAM HYDROBROMIDE 20 MG PO TABS: ORAL_TABLET | ORAL | Status: DC

## 2013-06-18 MED ORDER — GABAPENTIN 600 MG PO TABS: 600.0000 mg | ORAL_TABLET | Freq: Three times a day (TID) | ORAL | Status: DC

## 2013-06-18 NOTE — Telephone Encounter (Signed)
Diazepam called to pharmacy 

## 2013-06-18 NOTE — Telephone Encounter (Signed)
Okay to refill these medications? Please advise

## 2013-07-10 ENCOUNTER — Other Ambulatory Visit: Payer: Self-pay | Admitting: Internal Medicine

## 2013-07-28 ENCOUNTER — Encounter: Payer: Self-pay | Admitting: Endocrinology

## 2013-07-28 ENCOUNTER — Ambulatory Visit (INDEPENDENT_AMBULATORY_CARE_PROVIDER_SITE_OTHER): Payer: Medicare Other | Admitting: Endocrinology

## 2013-07-28 VITALS — BP 140/72 | HR 57 | Temp 98.3°F | Resp 12 | Ht 63.6 in | Wt 187.2 lb

## 2013-07-28 DIAGNOSIS — E785 Hyperlipidemia, unspecified: Secondary | ICD-10-CM

## 2013-07-28 DIAGNOSIS — N184 Chronic kidney disease, stage 4 (severe): Secondary | ICD-10-CM

## 2013-07-28 LAB — BASIC METABOLIC PANEL
CO2: 23 mEq/L (ref 19–32)
Calcium: 10 mg/dL (ref 8.4–10.5)
Chloride: 111 mEq/L (ref 96–112)
Potassium: 5.1 mEq/L (ref 3.5–5.1)
Sodium: 143 mEq/L (ref 135–145)

## 2013-07-28 LAB — FRUCTOSAMINE: Fructosamine: 319 umol/L — ABNORMAL HIGH (ref <285)

## 2013-07-28 NOTE — Patient Instructions (Signed)
Please check blood sugars at least half the time about 2 hours after any meal and as directed on waking up. Please bring blood sugar monitor to each visit  Reduce Lantus to 4 units  At breakfast take 3 Humalog

## 2013-07-28 NOTE — Progress Notes (Signed)
Patient ID: [name], female   DOB: March 17, 1928, 79 y.o.   MRN: [id number]  [name] is an 79 y.o. female.   Reason for Appointment: Diabetes follow-up   History of Present Illness   Diagnosis: Type 2 DIABETES MELITUS, date of diagnosis: 1987         Past history: Has been on insulin since about 1996 for her diabetes and over the last year requiring relatively low doses for control. Oral hypoglycemic drugs have been stopped because of renal insufficiency However her blood sugars have been quite erratic even though her A1c is reasonably good at 7-8%  RECENT history The patient's diabetes control is difficult to assess since she did not bring her monitor today and because of her memory issues as not able to remember her glucose readings No recent labs available On her last visit she was told to take increase her Lantus to 6 units because of most of her readings being 200 However she now says that occasionally she may have low sugars in the mornings Also difficult to certain her blood sugar pattern since she is  does not remember readings later in the day and still has occasional readings over 200  Insulin regimen: Lantus 6 units daily, Humalog 4 units before meals Proper timing of medications in relation to meals: Yes, usually.         Monitors blood glucose: Once a day.    Glucometer:  FreeStyle .          Blood Glucose readings 130-210, 59 once in the morning   Hypoglycemia frequency:  ? ams          Meals: 3 meals per day. Sandwich at lunch, she thinks she is covering all meals with insulin      Physical activity:  none, she is coming in a wheelchair            Last exam with ophthalmologist: 12/04/12  Wt Readings from Last 3 Encounters:  07/28/13 187 lb 3.2 oz (84.913 kg)  06/03/13 180 lb 14.4 oz (82.056 kg)  03/19/13 185 lb (83.915 kg)   Lab Results  Component Value Date   HGBA1C 6.3 06/03/2013   HGBA1C 7.3* 01/29/2013   HGBA1C 7.0* 12/31/2012   Lab Results   Component Value Date   MICROALBUR 1.8 06/03/2013   LDLCALC 84 06/03/2013   CREATININE 2.0* 07/28/2013       Medication List       This list is accurate as of: 07/28/13  9:37 AM.  Always use your most recent med list.               allopurinol 100 MG tablet  Commonly known as:  ZYLOPRIM  TAKE 2 TABLETS BY MOUTH EVERY DAY FOR GOUT     amLODipine 5 MG tablet  Commonly known as:  NORVASC  Take 5 mg by mouth daily.     amoxicillin-clavulanate 500-125 MG per tablet  Commonly known as:  AUGMENTIN  Take 1 tablet (500 mg total) by mouth every 12 (twelve) hours. 6  More days     aspirin 81 MG tablet  Take 81 mg by mouth daily.     atropine 1 % ophthalmic solution  Place 1 drop into the left eye 2 (two) times daily.     bimatoprost 0.03 % ophthalmic solution  Commonly known as:  LUMIGAN  Place 1 drop into the left eye daily.     cholecalciferol 1000 UNITS tablet  Commonly known as:  VITAMIN D  Take 1 tablet (1,000 Units total) by mouth daily.     citalopram 20 MG tablet  Commonly known as:  CELEXA  TAKE 1 TABLET BY MOUTH EVERY DAY FOR MOOD     colchicine 0.6 MG tablet  Take 1 tablet (0.6 mg total) by mouth as needed. Takes 2 tablets daily as needed for gout flareup.     diazepam 5 MG tablet  Commonly known as:  VALIUM  Tablet every 12 hours as needed     dorzolamide-timolol 22.3-6.8 MG/ML ophthalmic solution  Commonly known as:  COSOPT  Place 1 drop into both eyes 2 (two) times daily.     ergocalciferol 50000 UNITS capsule  Commonly known as:  VITAMIN D2  Take 50,000 Units by mouth once a week.     furosemide 80 MG tablet  Commonly known as:  LASIX  Take 0.5 tablets (40 mg total) by mouth daily.     gabapentin 600 MG tablet  Commonly known as:  NEURONTIN  Take 1 tablet (600 mg total) by mouth 3 (three) times daily.     glucose blood test strip  Commonly known as:  ACCU-CHEK SMARTVIEW  Use as instructed to check blood sugars 2 times per day dx code 250.00      hydrALAZINE 100 MG tablet  Commonly known as:  APRESOLINE  Take 100 mg by mouth 3 (three) times daily.     insulin lispro 100 UNIT/ML injection  Commonly known as:  HUMALOG  Inject 10 Units into the skin 3 (three) times daily before meals.     LANTUS SOLOSTAR 100 UNIT/ML Sopn  Generic drug:  Insulin Glargine     metoprolol tartrate 25 MG tablet  Commonly known as:  LOPRESSOR  Take 75 mg by mouth 2 (two) times daily.     nitroGLYCERIN 0.4 MG SL tablet  Commonly known as:  NITROSTAT  Place 0.4 mg under the tongue as needed. For chest pain.     ondansetron 4 MG tablet  Commonly known as:  ZOFRAN  Take 1 tablet (4 mg total) by mouth every 8 (eight) hours as needed for nausea.     pantoprazole 40 MG tablet  Commonly known as:  PROTONIX  TAKE 1 TABLET BY MOUTH EVERY DAY FOR ACID REFLUX     potassium chloride 10 MEQ tablet  Commonly known as:  K-DUR  Take 2 tablets (20 mEq total) by mouth daily.     pravastatin 80 MG tablet  Commonly known as:  PRAVACHOL  Take 80 mg by mouth daily.     saccharomyces boulardii 250 MG capsule  Commonly known as:  FLORASTOR  Take 1 capsule (250 mg total) by mouth 2 (two) times daily.     sodium chloride 5 % ophthalmic solution  Commonly known as:  MURO 128  Place 1 drop into the left eye 2 (two) times daily.     sulfamethoxazole-trimethoprim 800-160 MG per tablet  Commonly known as:  BACTRIM DS  Take 1 tablet by mouth every 12 (twelve) hours. 6 more days        Allergies: No Known Allergies  Past Medical History  Diagnosis Date  . Coronary artery disease   . Renal artery stenosis   . Hypertension   . Dyslipidemia   . GERD (gastroesophageal reflux disease)   . Tension headache   . Abdominal pain   . Anemia, unspecified   . Gout   . Back pain, chronic   . History of tonsillectomy   .  History of cholecystectomy   . Depression   . IDDM (insulin dependent diabetes mellitus)   . Diabetes mellitus   . CHF (congestive heart  failure)     Past Surgical History  Procedure Laterality Date  . Tonsillectomy    . Cholecystectomy  40 years ago    Family History  Problem Relation Age of Onset  . Coronary artery disease Father   . Hypertension Father   . Heart attack Father   . Hyperlipidemia Father   . Diabetes Sister   . Hypertension Sister   . Hyperlipidemia Sister   . Hypertension Brother   . Diabetes Brother   . Hyperlipidemia Brother   . Cancer Neg Hx     Breast and colon    Social History:  reports that she has never smoked. She does not have any smokeless tobacco history on file. She reports that she does not drink alcohol or use illicit drugs.  Review of Systems:  HYPERTENSION:  she is on multiple drugs for hypertension and blood pressure is excellent No recent pedal edema  HYPERLIPIDEMIA: The lipid abnormality consists of elevated LDL and high triglycerides, previous LDL 84 with HDL 36 and triglycerides 187. Currently on  Pravastatin  Last hemoglobin was 10.8     Examination:   BP 140/72  Pulse 57  Temp(Src) 98.3 F (36.8 C)  Resp 12  Ht 5' 3.6" (1.615 m)  Wt 187 lb 3.2 oz (84.913 kg)  BMI 32.56 kg/m2  SpO2 98%  Body mass index is 32.56 kg/(m^2).   She has ichthyosis and pigmentation on the front of her legs  ASSESSMENT/ PLAN::   Diabetes type 2   The patient's diabetes control is difficult to assess as she did not bring her monitor and not clear what her readings are She thinks she has mild hypoglycemia occasionally in the morning even though she is eating reasonably well and her weight has increased without any edema For now she will go back to 4 units of Lantus again and made sure she calls if blood sugars are consistently high or low  She needs to followup with PCP for skin changes on her legs, may need dermatology consultation  [facility] 07/28/2013, 9:37 AM   Addendum: Labs as follows  Office Visit on 07/28/2013  Component Date Value Range Status  . Fructosamine  07/28/2013 319* <285 umol/L Final   Comment:                            Variations in levels of serum proteins (albumin and immunoglobulins)                          may affect fructosamine results.                             . Sodium 07/28/2013 143  135 - 145 mEq/L Final  . Potassium 07/28/2013 5.1  3.5 - 5.1 mEq/L Final  . Chloride 07/28/2013 111  96 - 112 mEq/L Final  . CO2 07/28/2013 23  19 - 32 mEq/L Final  . Glucose, Bld 07/28/2013 102* 70 - 99 mg/dL Final  . BUN 16/07/9603 34* 6 - 23 mg/dL Final  . Creatinine, Ser 07/28/2013 2.0* 0.4 - 1.2 mg/dL Final  . Calcium 54/06/8118 10.0  8.4 - 10.5 mg/dL Final  . GFR 14/78/2956 29.87* >60.00 mL/min Final

## 2013-07-30 ENCOUNTER — Other Ambulatory Visit: Payer: Self-pay | Admitting: *Deleted

## 2013-07-30 MED ORDER — PRAVASTATIN SODIUM 80 MG PO TABS: 80.0000 mg | ORAL_TABLET | Freq: Every day | ORAL | Status: DC

## 2013-08-16 ENCOUNTER — Other Ambulatory Visit: Payer: Self-pay | Admitting: Cardiovascular Disease

## 2013-08-19 ENCOUNTER — Other Ambulatory Visit: Payer: Self-pay

## 2013-08-26 NOTE — Telephone Encounter (Signed)
Phone note completed ° °

## 2013-08-30 ENCOUNTER — Other Ambulatory Visit: Payer: Self-pay | Admitting: Internal Medicine

## 2013-08-30 MED ORDER — ONDANSETRON HCL 4 MG PO TABS: 4.0000 mg | ORAL_TABLET | Freq: Three times a day (TID) | ORAL | Status: DC | PRN

## 2013-09-07 ENCOUNTER — Other Ambulatory Visit: Payer: Self-pay | Admitting: *Deleted

## 2013-09-07 MED ORDER — AMLODIPINE BESYLATE 5 MG PO TABS: 5.0000 mg | ORAL_TABLET | Freq: Every day | ORAL | Status: DC

## 2013-09-09 ENCOUNTER — Other Ambulatory Visit: Payer: Self-pay | Admitting: Cardiovascular Disease

## 2013-09-17 ENCOUNTER — Other Ambulatory Visit: Payer: Self-pay | Admitting: Cardiovascular Disease

## 2013-09-25 ENCOUNTER — Other Ambulatory Visit: Payer: Self-pay | Admitting: Cardiovascular Disease

## 2013-09-27 ENCOUNTER — Other Ambulatory Visit: Payer: Self-pay | Admitting: Cardiovascular Disease

## 2013-09-28 DIAGNOSIS — K219 Gastro-esophageal reflux disease without esophagitis: Secondary | ICD-10-CM | POA: Insufficient documentation

## 2013-09-28 DIAGNOSIS — E139 Other specified diabetes mellitus without complications: Secondary | ICD-10-CM | POA: Insufficient documentation

## 2013-09-28 DIAGNOSIS — I10 Essential (primary) hypertension: Secondary | ICD-10-CM | POA: Insufficient documentation

## 2013-09-28 DIAGNOSIS — I2510 Atherosclerotic heart disease of native coronary artery without angina pectoris: Secondary | ICD-10-CM | POA: Insufficient documentation

## 2013-10-04 ENCOUNTER — Other Ambulatory Visit: Payer: Self-pay | Admitting: Internal Medicine

## 2013-10-04 ENCOUNTER — Telehealth: Payer: Self-pay | Admitting: Cardiovascular Disease

## 2013-10-04 ENCOUNTER — Other Ambulatory Visit: Payer: Self-pay | Admitting: Cardiovascular Disease

## 2013-10-04 MED ORDER — HYDRALAZINE HCL 100 MG PO TABS: ORAL_TABLET | ORAL | Status: DC

## 2013-10-04 MED ORDER — ONDANSETRON HCL 4 MG PO TABS: 4.0000 mg | ORAL_TABLET | Freq: Three times a day (TID) | ORAL | Status: DC | PRN

## 2013-10-04 NOTE — Telephone Encounter (Signed)
Pt has not been seen in office since 2012. Messages have been left on refill of this medication for pt to contact office to schedule appt.  I placed call to pt to see if she would like to schedule appt with Dr. Clifton James. No answer and no voicemail.  I left message on daughter's home phone to contact office

## 2013-10-04 NOTE — Telephone Encounter (Signed)
Spoke with pt and she would like to continue to follow up in our office. Appt made for her to see Norma Fredrickson, NP on January 13,2015 at 1:30. Will send one month supply of apresoline to [facility] on [address]. Pt aware she will need to keep this appt for further refills.

## 2013-10-18 ENCOUNTER — Encounter: Payer: Self-pay | Admitting: Internal Medicine

## 2013-10-26 ENCOUNTER — Ambulatory Visit (INDEPENDENT_AMBULATORY_CARE_PROVIDER_SITE_OTHER): Payer: Medicare Other | Admitting: Nurse Practitioner

## 2013-10-26 ENCOUNTER — Other Ambulatory Visit: Payer: Self-pay | Admitting: *Deleted

## 2013-10-26 ENCOUNTER — Encounter: Payer: Self-pay | Admitting: Nurse Practitioner

## 2013-10-26 ENCOUNTER — Telehealth: Payer: Self-pay | Admitting: *Deleted

## 2013-10-26 VITALS — BP 200/90 | HR 52 | Ht 62.0 in | Wt 181.1 lb

## 2013-10-26 DIAGNOSIS — I259 Chronic ischemic heart disease, unspecified: Secondary | ICD-10-CM

## 2013-10-26 LAB — CBC
HCT: 32.5 % — ABNORMAL LOW (ref 36.0–46.0)
Hemoglobin: 10.4 g/dL — ABNORMAL LOW (ref 12.0–15.0)
MCHC: 31.9 g/dL (ref 30.0–36.0)
MCV: 85.4 fl (ref 78.0–100.0)
Platelets: 177 10*3/uL (ref 150.0–400.0)
RBC: 3.8 Mil/uL — ABNORMAL LOW (ref 3.87–5.11)
RDW: 19.7 % — ABNORMAL HIGH (ref 11.5–14.6)
WBC: 5.9 10*3/uL (ref 4.5–10.5)

## 2013-10-26 LAB — BASIC METABOLIC PANEL
BUN: 23 mg/dL (ref 6–23)
CO2: 25 mEq/L (ref 19–32)
Calcium: 10.2 mg/dL (ref 8.4–10.5)
Chloride: 110 mEq/L (ref 96–112)
Creatinine, Ser: 1.4 mg/dL — ABNORMAL HIGH (ref 0.4–1.2)
GFR: 45.35 mL/min — ABNORMAL LOW (ref >60.00)
Glucose, Bld: 94 mg/dL (ref 70–99)
Potassium: 5.6 mEq/L — ABNORMAL HIGH (ref 3.5–5.1)
Sodium: 143 mEq/L (ref 135–145)

## 2013-10-26 MED ORDER — METOPROLOL TARTRATE 25 MG PO TABS: 75.0000 mg | ORAL_TABLET | Freq: Two times a day (BID) | ORAL | Status: DC

## 2013-10-26 MED ORDER — FUROSEMIDE 80 MG PO TABS: 40.0000 mg | ORAL_TABLET | Freq: Every day | ORAL | Status: DC

## 2013-10-26 NOTE — Telephone Encounter (Signed)
S/w [facility] staff Aldona Bar last time pt had metoprolol tartrate ( 75 mg)  Bid was Feb 15, 2013 and Lasix ( 40 mg) daily was April 10, 2013

## 2013-10-26 NOTE — Progress Notes (Signed)
[name] Date of Birth: [birth year] Medical Record #[id number]  History of Present Illness: [name] is seen back today for a follow up visit. Seen for Dr. Angelena Form. She has not been here in over 2 1/2 years. She has dementia, HTN, HLD, gout, CAD with past DES to the LAD back in 2006, RAS, DM, anemia GERD, OA, anemia and CKD.   Last seen 2 1/2 years ago. Seemed to be doing ok. Review of her EPIC record shows a generalized decline.   Comes back today. Here with her son, [name]. Using a walker. Moving pretty slow. Has not seen a doctor in many months. John says he is now trying to help with her care - says that the patient's daughter "pissed off right now". Medicines are VERY unclear. Does not look like she is on her Lopressor or Lasix but heart rate is low. Drug store says these medicines were last filled in May/June. She denies chest pain. Pleasantly confused. BP grossly elevated. Details of her RAS are quite unclear. Looks like she last saw PCP in April - then went to the hospital with pancreatitis and ended up going to the SNF.   Current Outpatient Prescriptions  Medication Sig Dispense Refill  . allopurinol (ZYLOPRIM) 100 MG tablet TAKE 2 TABLETS BY MOUTH EVERY DAY FOR GOUT  60 tablet  5  . amLODipine (NORVASC) 5 MG tablet Take 1 tablet (5 mg total) by mouth daily.  30 tablet  5  . aspirin 81 MG tablet Take 81 mg by mouth daily.        Marland Kitchen atropine 1 % ophthalmic solution Place 1 drop into the left eye 2 (two) times daily.      . bimatoprost (LUMIGAN) 0.03 % ophthalmic solution Place 1 drop into the left eye daily.  2.5 mL  3  . brimonidine (ALPHAGAN P) 0.1 % SOLN 1 drop every 8 (eight) hours.      . cholecalciferol (VITAMIN D) 1000 UNITS tablet Take 1 tablet (1,000 Units total) by mouth daily.  100 tablet  3  . citalopram (CELEXA) 20 MG tablet TAKE 1 TABLET BY MOUTH EVERY DAY FOR MOOD  30 tablet  5  . colchicine 0.6 MG tablet Take 1 tablet (0.6 mg total) by mouth as needed. Takes 2  tablets daily as needed for gout flareup.  30 tablet  0  . dorzolamide-timolol (COSOPT) 22.3-6.8 MG/ML ophthalmic solution Place 1 drop into both eyes 2 (two) times daily.      . ergocalciferol (VITAMIN D2) 50000 UNITS capsule Take 50,000 Units by mouth once a week.        . gabapentin (NEURONTIN) 600 MG tablet Take 1 tablet (600 mg total) by mouth 3 (three) times daily.  90 tablet  5  . glucose blood (ACCU-CHEK SMARTVIEW) test strip Use as instructed to check blood sugars 2 times per day dx code 250.00  100 each  12  . hydrALAZINE (APRESOLINE) 100 MG tablet TAKE 1 TABLET BY MOUTH 3 TIMES A DAY  90 tablet  0  . Insulin Glargine (LANTUS SOLOSTAR) 100 UNIT/ML SOPN       . insulin lispro (HUMALOG) 100 UNIT/ML injection Inject 10 Units into the skin 3 (three) times daily before meals.       . metoprolol tartrate (LOPRESSOR) 25 MG tablet Take 3 tablets (75 mg total) by mouth 2 (two) times daily.  180 tablet  3  . nitroGLYCERIN (NITROSTAT) 0.4 MG SL tablet Place 0.4 mg under the tongue  as needed. For chest pain.       . pantoprazole (PROTONIX) 40 MG tablet TAKE 1 TABLET BY MOUTH EVERY DAY FOR ACID REFLUX  30 tablet  5  . potassium chloride (K-DUR) 10 MEQ tablet Take 2 tablets (20 mEq total) by mouth daily.  30 tablet  5  . pravastatin (PRAVACHOL) 80 MG tablet Take 1 tablet (80 mg total) by mouth daily.  30 tablet  5  . sodium chloride (MURO 128) 5 % ophthalmic solution Place 1 drop into the left eye 2 (two) times daily.      . diazepam (VALIUM) 5 MG tablet Tablet every 12 hours as needed  60 tablet  5  . furosemide (LASIX) 80 MG tablet Take 0.5 tablets (40 mg total) by mouth daily.  30 tablet  3  . saccharomyces boulardii (FLORASTOR) 250 MG capsule Take 1 capsule (250 mg total) by mouth 2 (two) times daily.       No current facility-administered medications for this visit.    No Known Allergies  Past Medical History  Diagnosis Date  . Coronary artery disease   . Renal artery stenosis   .  Hypertension   . Dyslipidemia   . GERD (gastroesophageal reflux disease)   . Tension headache   . Abdominal pain   . Anemia, unspecified   . Gout   . Back pain, chronic   . History of tonsillectomy   . History of cholecystectomy   . Depression   . IDDM (insulin dependent diabetes mellitus)   . Diabetes mellitus   . CHF (congestive heart failure)     Past Surgical History  Procedure Laterality Date  . Tonsillectomy    . Cholecystectomy  40 years ago    History  Smoking status  . Never Smoker   Smokeless tobacco  . Not on file    History  Alcohol Use No    Family History  Problem Relation Age of Onset  . Coronary artery disease Father   . Hypertension Father   . Heart attack Father   . Hyperlipidemia Father   . Diabetes Sister   . Hypertension Sister   . Hyperlipidemia Sister   . Hypertension Brother   . Diabetes Brother   . Hyperlipidemia Brother   . Cancer Neg Hx     Breast and colon    Review of Systems: The review of systems is per the HPI.  All other systems were reviewed and are negative.  Physical Exam: BP 200/90  Pulse 52  Ht 5\' 2"  (1.575 m)  Wt 181 lb 1.9 oz (82.155 kg)  BMI 33.12 kg/m2  SpO2 100% Patient is an elderly female who is in no acute distress. Confused. Not able to provide a history. Skin is warm and dry. Color is normal.  HEENT is unremarkable. Normocephalic/atraumatic. PERRL. Sclera are nonicteric. Neck is supple. No masses. No JVD. Lungs are clear. Cardiac exam shows a regular rate and rhythm. Abdomen is soft. Extremities are without edema. Gait and ROM are intact. No gross neurologic deficits noted.  LABORATORY DATA: PENDING  Lab Results  Component Value Date   WBC 6.4 06/03/2013   HGB 10.8* 06/03/2013   HCT 33.2* 06/03/2013   PLT 157.0 06/03/2013   GLUCOSE 102* 07/28/2013   CHOL 155 06/03/2013   TRIG 180.0* 06/03/2013   HDL 35.20* 06/03/2013   LDLDIRECT 70.3 09/27/2010   LDLCALC 84 06/03/2013   ALT 10 06/03/2013   AST 14  06/03/2013   NA 143 07/28/2013  K 5.1 07/28/2013   CL 111 07/28/2013   CREATININE 2.0* 07/28/2013   BUN 34* 07/28/2013   CO2 23 07/28/2013   TSH 3.37 01/29/2013   INR 1.20 01/05/2013   HGBA1C 6.3 06/03/2013   MICROALBUR 1.8 06/03/2013     Assessment / Plan: 1. CAD - no active symptoms - I would favor conservative approach  2. HTN - medicines are very unclear - need to clarify - son now has her AVS and is to go over this when they get home. I am refilling her Lopressor and Lasix - I cannot tell if she is on - these are not in her medicine bag. If she does have these - will need further adjustment of her medicines. If she does not have, they have been refilled and she is to resume taking. Will need to be seen back in a couple of days - son is not happy with this due to having to take off from work. I have explained to him that her BP is quite high and that medication compliance is crucial.  3. DM - followed by Dr. Dwyane Dee  4. HLD - on Pravachol  5. RAS - not clear as to the details with this.   6. Dementia  Overall prognosis looks quite tenuous - hard to tell how much her family is really engaged with her care. Will check her labs today. Needs to get to PCP as well.   Patient is agreeable to this plan and will call if any problems develop in the interim.   Burtis Junes, RN, [facility] [address] [place], [place]  [id number] [phone]

## 2013-10-26 NOTE — Patient Instructions (Addendum)
You do not have all your medicines with you today - I am concerned that you are not taking all of your blood pressure medicines.  When you get home, go over this list of your medicines with your actual bottles - let us know if it does not match up  I have sent in prescriptions for the Furosemide and the Lopressor - make sure you are not already on these at home - if you are - you need to call me back so I can further adjust your medicines  Need an OV with primary care - Dr. Crissie Sickles  We need to check labs today  I will see you in 3 to 5 days  Call the [place] office at [phone] if you have any questions, problems or concerns.

## 2013-10-29 ENCOUNTER — Ambulatory Visit: Payer: Medicare Other | Admitting: Nurse Practitioner

## 2013-11-03 ENCOUNTER — Other Ambulatory Visit (INDEPENDENT_AMBULATORY_CARE_PROVIDER_SITE_OTHER): Payer: Medicare Other

## 2013-11-03 ENCOUNTER — Ambulatory Visit (INDEPENDENT_AMBULATORY_CARE_PROVIDER_SITE_OTHER): Payer: Medicare Other | Admitting: Internal Medicine

## 2013-11-03 ENCOUNTER — Encounter: Payer: Self-pay | Admitting: Internal Medicine

## 2013-11-03 VITALS — BP 150/80 | HR 51 | Temp 98.1°F | Wt 181.0 lb

## 2013-11-03 DIAGNOSIS — R21 Rash and other nonspecific skin eruption: Secondary | ICD-10-CM

## 2013-11-03 DIAGNOSIS — E109 Type 1 diabetes mellitus without complications: Secondary | ICD-10-CM

## 2013-11-03 DIAGNOSIS — Z23 Encounter for immunization: Secondary | ICD-10-CM

## 2013-11-03 LAB — HEMOGLOBIN A1C: Hgb A1c MFr Bld: 5.9 % (ref 4.6–6.5)

## 2013-11-03 NOTE — Patient Instructions (Signed)
Thanks for coming to see me.  You got a good report from the heart doctors. Lab that day showed improved kidney function  Diabetes - will check the A1C today with recommendations to follow - will report by MyChart  Blood pressure up a little today at 150/80. No need to make any changes in medication  Skin - mystery rash on both legs: may be lichen planus or it could be related to DM.  Plan Over the counter 1% cortisone lotion to the legs twice a day  If the rash does not continue to get better - dermatology consult.   Sore left shoulder - on exam there is no click or grinding sound Plan When the pain flares use a rub of choice, e.g Icy-hot, Ben-Gay, etc.

## 2013-11-03 NOTE — Progress Notes (Signed)
Pre visit review using our clinic review tool, if applicable. No additional management support is needed unless otherwise documented below in the visit note. 

## 2013-11-03 NOTE — Progress Notes (Signed)
Subjective:    Patient ID: RAYVEN RETTIG, female    DOB: [birth year], 80 y.o.   MRN: [id number]  HPI Mrs. Leonides Schanz presents for follow up of diabetes. She was seen by cardiology Jan 13th - doing well. She had a BMet that was normal.   CC: intermittent low back pain; loosing vision - for corneal transplant left Feb '15 at [facility].  She has a thick, black, flat rash on both LE that started at the time of hospitalization in May '14 for pancreatitis. With lotion over time the black plaque is peeling off. Not painful  Past Medical History  Diagnosis Date  . Coronary artery disease   . Renal artery stenosis   . Hypertension   . Dyslipidemia   . GERD (gastroesophageal reflux disease)   . Tension headache   . Abdominal pain   . Anemia, unspecified   . Gout   . Back pain, chronic   . History of tonsillectomy   . History of cholecystectomy   . Depression   . IDDM (insulin dependent diabetes mellitus)   . Diabetes mellitus   . CHF (congestive heart failure)    Past Surgical History  Procedure Laterality Date  . Tonsillectomy    . Cholecystectomy  40 years ago   Family History  Problem Relation Age of Onset  . Coronary artery disease Father   . Hypertension Father   . Heart attack Father   . Hyperlipidemia Father   . Diabetes Sister   . Hypertension Sister   . Hyperlipidemia Sister   . Hypertension Brother   . Diabetes Brother   . Hyperlipidemia Brother   . Cancer Neg Hx     Breast and colon   History   Social History  . Marital Status: Widowed    Spouse Name: N/A    Number of Children: 6  . Years of Education: N/A   Occupational History  . Retired    Social History Main Topics  . Smoking status: Never Smoker   . Smokeless tobacco: Not on file  . Alcohol Use: No  . Drug Use: No  . Sexual Activity: Not Currently   Other Topics Concern  . Not on file   Social History Narrative   Married over 50 years.   8th grade-went to work.      Physician Roster:    [place]    Current Outpatient Prescriptions on File Prior to Visit  Medication Sig Dispense Refill  . allopurinol (ZYLOPRIM) 100 MG tablet TAKE 2 TABLETS BY MOUTH EVERY DAY FOR GOUT  60 tablet  5  . amLODipine (NORVASC) 5 MG tablet Take 1 tablet (5 mg total) by mouth daily.  30 tablet  5  . aspirin 81 MG tablet Take 81 mg by mouth daily.        . bimatoprost (LUMIGAN) 0.03 % ophthalmic solution Place 1 drop into the left eye daily.  2.5 mL  3  . cholecalciferol (VITAMIN D) 1000 UNITS tablet Take 1 tablet (1,000 Units total) by mouth daily.  100 tablet  3  . citalopram (CELEXA) 20 MG tablet TAKE 1 TABLET BY MOUTH EVERY DAY FOR MOOD  30 tablet  5  . colchicine 0.6 MG tablet Take 1 tablet (0.6 mg total) by mouth as needed. Takes 2 tablets daily as needed for gout flareup.  30 tablet  0  . furosemide (LASIX) 80 MG tablet Take 0.5 tablets (40 mg total) by mouth  daily.  30 tablet  3  . gabapentin (NEURONTIN) 600 MG tablet Take 1 tablet (600 mg total) by mouth 3 (three) times daily.  90 tablet  5  . hydrALAZINE (APRESOLINE) 100 MG tablet TAKE 1 TABLET BY MOUTH 3 TIMES A DAY  90 tablet  0  . Insulin Glargine (LANTUS SOLOSTAR) 100 UNIT/ML SOPN       . insulin lispro (HUMALOG) 100 UNIT/ML injection Inject 10 Units into the skin 3 (three) times daily before meals.       . metoprolol tartrate (LOPRESSOR) 25 MG tablet Take 3 tablets (75 mg total) by mouth 2 (two) times daily.  180 tablet  3  . nitroGLYCERIN (NITROSTAT) 0.4 MG SL tablet Place 0.4 mg under the tongue as needed. For chest pain.       . pantoprazole (PROTONIX) 40 MG tablet TAKE 1 TABLET BY MOUTH EVERY DAY FOR ACID REFLUX  30 tablet  5  . potassium chloride (K-DUR) 10 MEQ tablet Take 2 tablets (20 mEq total) by mouth daily.  30 tablet  5  . pravastatin (PRAVACHOL) 80 MG tablet Take 1 tablet (80 mg total) by mouth daily.  30 tablet  5  . atropine 1 % ophthalmic solution Place 1 drop into the left eye 2  (two) times daily.      . brimonidine (ALPHAGAN P) 0.1 % SOLN 1 drop every 8 (eight) hours.      . dorzolamide-timolol (COSOPT) 22.3-6.8 MG/ML ophthalmic solution Place 1 drop into both eyes 2 (two) times daily.      Marland Kitchen glucose blood (ACCU-CHEK SMARTVIEW) test strip Use as instructed to check blood sugars 2 times per day dx code 250.00  100 each  12  . saccharomyces boulardii (FLORASTOR) 250 MG capsule Take 1 capsule (250 mg total) by mouth 2 (two) times daily.      . sodium chloride (MURO 128) 5 % ophthalmic solution Place 1 drop into the left eye 2 (two) times daily.       No current facility-administered medications on file prior to visit.      Review of Systems System review is negative for any constitutional, cardiac, pulmonary, GI or neuro symptoms or complaints other than as described in the HPI.      Objective:   Physical Exam Filed Vitals:   11/03/13 1021  BP: 150/80  Pulse: 51  Temp: 98.1 F (36.7 C)   Gen'l- Elderly woman in no distress with blindness right eye, limited vision left HEENT- corneal opacity OD lens Cor - 2+ radial and DP pulses, RRR, III/VI systolic mm best at right sternal border Pulm - normal respirations Derm - distal shin with flat black plaque that can peel away. No open lesion       Assessment & Plan:  1. Sore left shoulder - on exam there is no click or grinding sound Plan When the pain flares use a rub of choice, e.g Icy-hot, Ben-Gay, etc.

## 2013-11-04 DIAGNOSIS — R21 Rash and other nonspecific skin eruption: Secondary | ICD-10-CM | POA: Insufficient documentation

## 2013-11-04 NOTE — Assessment & Plan Note (Signed)
Lab Results  Component Value Date   HGBA1C 5.9 11/03/2013   Excellent control.  Plan Continue present regimen

## 2013-11-04 NOTE — Assessment & Plan Note (Signed)
mystery rash on both legs: may be lichen planus or it could be related to DM.  Plan Over the counter 1% cortisone lotion to the legs twice a day  If the rash does not continue to get better - dermatology consult.

## 2013-11-11 ENCOUNTER — Emergency Department (HOSPITAL_COMMUNITY): Payer: Medicare Other

## 2013-11-11 ENCOUNTER — Inpatient Hospital Stay (HOSPITAL_COMMUNITY)
Admission: EM | Admit: 2013-11-11 | Discharge: 2013-11-14 | DRG: 291 | Disposition: A | Discharge status: Home Health | Payer: Medicare Other | Attending: Internal Medicine | Admitting: Internal Medicine

## 2013-11-11 ENCOUNTER — Encounter (HOSPITAL_COMMUNITY): Payer: Self-pay | Admitting: Emergency Medicine

## 2013-11-11 DIAGNOSIS — K219 Gastro-esophageal reflux disease without esophagitis: Secondary | ICD-10-CM

## 2013-11-11 DIAGNOSIS — R0602 Shortness of breath: Secondary | ICD-10-CM

## 2013-11-11 DIAGNOSIS — I2789 Other specified pulmonary heart diseases: Secondary | ICD-10-CM | POA: Diagnosis present

## 2013-11-11 DIAGNOSIS — R5381 Other malaise: Secondary | ICD-10-CM

## 2013-11-11 DIAGNOSIS — J189 Pneumonia, unspecified organism: Secondary | ICD-10-CM

## 2013-11-11 DIAGNOSIS — Z7982 Long term (current) use of aspirin: Secondary | ICD-10-CM

## 2013-11-11 DIAGNOSIS — M549 Dorsalgia, unspecified: Secondary | ICD-10-CM | POA: Diagnosis present

## 2013-11-11 DIAGNOSIS — I5033 Acute on chronic diastolic (congestive) heart failure: Principal | ICD-10-CM | POA: Diagnosis present

## 2013-11-11 DIAGNOSIS — N184 Chronic kidney disease, stage 4 (severe): Secondary | ICD-10-CM | POA: Diagnosis present

## 2013-11-11 DIAGNOSIS — I509 Heart failure, unspecified: Secondary | ICD-10-CM

## 2013-11-11 DIAGNOSIS — E109 Type 1 diabetes mellitus without complications: Secondary | ICD-10-CM

## 2013-11-11 DIAGNOSIS — H409 Unspecified glaucoma: Secondary | ICD-10-CM | POA: Diagnosis present

## 2013-11-11 DIAGNOSIS — I10 Essential (primary) hypertension: Secondary | ICD-10-CM

## 2013-11-11 DIAGNOSIS — Z9861 Coronary angioplasty status: Secondary | ICD-10-CM

## 2013-11-11 DIAGNOSIS — M109 Gout, unspecified: Secondary | ICD-10-CM

## 2013-11-11 DIAGNOSIS — Z79899 Other long term (current) drug therapy: Secondary | ICD-10-CM

## 2013-11-11 DIAGNOSIS — IMO0001 Reserved for inherently not codable concepts without codable children: Secondary | ICD-10-CM | POA: Diagnosis present

## 2013-11-11 DIAGNOSIS — I129 Hypertensive chronic kidney disease with stage 1 through stage 4 chronic kidney disease, or unspecified chronic kidney disease: Secondary | ICD-10-CM | POA: Diagnosis present

## 2013-11-11 DIAGNOSIS — G8929 Other chronic pain: Secondary | ICD-10-CM | POA: Diagnosis present

## 2013-11-11 DIAGNOSIS — Z794 Long term (current) use of insulin: Secondary | ICD-10-CM

## 2013-11-11 DIAGNOSIS — E785 Hyperlipidemia, unspecified: Secondary | ICD-10-CM | POA: Diagnosis present

## 2013-11-11 DIAGNOSIS — E119 Type 2 diabetes mellitus without complications: Secondary | ICD-10-CM | POA: Diagnosis present

## 2013-11-11 DIAGNOSIS — I2510 Atherosclerotic heart disease of native coronary artery without angina pectoris: Secondary | ICD-10-CM

## 2013-11-11 LAB — CBC WITH DIFFERENTIAL/PLATELET
BASOS PCT: 0 % (ref 0–1)
Basophils Absolute: 0 10*3/uL (ref 0.0–0.1)
EOS ABS: 0.1 10*3/uL (ref 0.0–0.7)
EOS PCT: 1 % (ref 0–5)
HEMATOCRIT: 31.2 % — AB (ref 36.0–46.0)
Hemoglobin: 9.9 g/dL — ABNORMAL LOW (ref 12.0–15.0)
LYMPHS PCT: 5 % — AB (ref 12–46)
Lymphs Abs: 0.7 10*3/uL (ref 0.7–4.0)
MCH: 27.7 pg (ref 26.0–34.0)
MCHC: 31.7 g/dL (ref 30.0–36.0)
MCV: 87.2 fL (ref 78.0–100.0)
MONO ABS: 1.3 10*3/uL — AB (ref 0.1–1.0)
Monocytes Relative: 9 % (ref 3–12)
Neutro Abs: 12 10*3/uL — ABNORMAL HIGH (ref 1.7–7.7)
Neutrophils Relative %: 85 % — ABNORMAL HIGH (ref 43–77)
PLATELETS: 181 10*3/uL (ref 150–400)
RBC: 3.58 MIL/uL — ABNORMAL LOW (ref 3.87–5.11)
RDW: 18.6 % — AB (ref 11.5–15.5)
WBC: 14.1 10*3/uL — ABNORMAL HIGH (ref 4.0–10.5)

## 2013-11-11 LAB — COMPREHENSIVE METABOLIC PANEL
ALBUMIN: 4 g/dL (ref 3.5–5.2)
ALT: 13 U/L (ref 0–35)
AST: 21 U/L (ref 0–37)
Alkaline Phosphatase: 118 U/L — ABNORMAL HIGH (ref 39–117)
BUN: 30 mg/dL — ABNORMAL HIGH (ref 6–23)
CALCIUM: 9.6 mg/dL (ref 8.4–10.5)
CO2: 24 mEq/L (ref 19–32)
CREATININE: 1.74 mg/dL — AB (ref 0.50–1.10)
Chloride: 109 mEq/L (ref 96–112)
GFR calc Af Amer: 30 mL/min — ABNORMAL LOW (ref >90)
GFR calc non Af Amer: 26 mL/min — ABNORMAL LOW (ref >90)
Glucose, Bld: 69 mg/dL — ABNORMAL LOW (ref 70–99)
Potassium: 5.8 mEq/L — ABNORMAL HIGH (ref 3.7–5.3)
SODIUM: 144 meq/L (ref 137–147)
TOTAL PROTEIN: 7.7 g/dL (ref 6.0–8.3)
Total Bilirubin: 0.3 mg/dL (ref 0.3–1.2)

## 2013-11-11 LAB — GLUCOSE, CAPILLARY
GLUCOSE-CAPILLARY: 110 mg/dL — AB (ref 70–99)
Glucose-Capillary: 95 mg/dL (ref 70–99)
Glucose-Capillary: 76 mg/dL (ref 70–99)
Glucose-Capillary: 94 mg/dL (ref 70–99)

## 2013-11-11 LAB — URINALYSIS, ROUTINE W REFLEX MICROSCOPIC
Bilirubin Urine: NEGATIVE
GLUCOSE, UA: NEGATIVE mg/dL
Hgb urine dipstick: NEGATIVE
Ketones, ur: NEGATIVE mg/dL
LEUKOCYTES UA: NEGATIVE
Nitrite: NEGATIVE
PH: 6.5 (ref 5.0–8.0)
Protein, ur: NEGATIVE mg/dL
SPECIFIC GRAVITY, URINE: 1.009 (ref 1.005–1.030)
Urobilinogen, UA: 0.2 mg/dL (ref 0.0–1.0)

## 2013-11-11 LAB — TROPONIN I

## 2013-11-11 LAB — LACTIC ACID, PLASMA: LACTIC ACID, VENOUS: 1.1 mmol/L (ref 0.5–2.2)

## 2013-11-11 LAB — CBC
HCT: 30.8 % — ABNORMAL LOW (ref 36.0–46.0)
HEMOGLOBIN: 9.7 g/dL — AB (ref 12.0–15.0)
MCH: 27.1 pg (ref 26.0–34.0)
MCHC: 31.5 g/dL (ref 30.0–36.0)
MCV: 86 fL (ref 78.0–100.0)
Platelets: 130 10*3/uL — ABNORMAL LOW (ref 150–400)
RBC: 3.58 MIL/uL — AB (ref 3.87–5.11)
RDW: 18.5 % — ABNORMAL HIGH (ref 11.5–15.5)
WBC: 13.4 10*3/uL — ABNORMAL HIGH (ref 4.0–10.5)

## 2013-11-11 LAB — CREATININE, SERUM
CREATININE: 1.69 mg/dL — AB (ref 0.50–1.10)
GFR calc non Af Amer: 27 mL/min — ABNORMAL LOW (ref >90)
GFR, EST AFRICAN AMERICAN: 31 mL/min — AB (ref >90)

## 2013-11-11 LAB — POCT I-STAT TROPONIN I: TROPONIN I, POC: 0.03 ng/mL (ref 0.00–0.08)

## 2013-11-11 LAB — MRSA PCR SCREENING: MRSA by PCR: NEGATIVE

## 2013-11-11 LAB — PRO B NATRIURETIC PEPTIDE: PRO B NATRI PEPTIDE: 1922 pg/mL — AB (ref 0–450)

## 2013-11-11 LAB — LIPASE, BLOOD: Lipase: 42 U/L (ref 11–59)

## 2013-11-11 MED ORDER — DEXTROSE 5 % IV SOLN
1.0000 g | Freq: Once | INTRAVENOUS | Status: AC
  Administered 2013-11-11: 1 g via INTRAVENOUS
  Filled 2013-11-11: qty 10

## 2013-11-11 MED ORDER — FUROSEMIDE 10 MG/ML IJ SOLN
40.0000 mg | INTRAMUSCULAR | Status: AC
  Administered 2013-11-11: 40 mg via INTRAVENOUS
  Filled 2013-11-11: qty 4

## 2013-11-11 MED ORDER — TIMOLOL MALEATE 0.5 % OP SOLN
1.0000 [drp] | Freq: Two times a day (BID) | OPHTHALMIC | Status: DC
  Administered 2013-11-11 – 2013-11-14 (×6): 1 [drp] via OPHTHALMIC
  Filled 2013-11-11: qty 5

## 2013-11-11 MED ORDER — INSULIN ASPART 100 UNIT/ML ~~LOC~~ SOLN
0.0000 [IU] | Freq: Three times a day (TID) | SUBCUTANEOUS | Status: DC
Start: 2013-11-11 — End: 2013-11-14
  Administered 2013-11-12: 2 [IU] via SUBCUTANEOUS
  Administered 2013-11-12 – 2013-11-14 (×4): 1 [IU] via SUBCUTANEOUS
  Administered 2013-11-14: 7 [IU] via SUBCUTANEOUS

## 2013-11-11 MED ORDER — METOPROLOL TARTRATE 50 MG PO TABS
75.0000 mg | ORAL_TABLET | Freq: Two times a day (BID) | ORAL | Status: DC
  Administered 2013-11-11 – 2013-11-14 (×6): 75 mg via ORAL
  Filled 2013-11-11 (×7): qty 1

## 2013-11-11 MED ORDER — IPRATROPIUM BROMIDE 0.02 % IN SOLN
0.5000 mg | Freq: Once | RESPIRATORY_TRACT | Status: AC
  Administered 2013-11-11: 0.5 mg via RESPIRATORY_TRACT
  Filled 2013-11-11: qty 2.5

## 2013-11-11 MED ORDER — DEXTROSE 5 % IV SOLN
1.0000 g | INTRAVENOUS | Status: DC
  Administered 2013-11-12 – 2013-11-14 (×3): 1 g via INTRAVENOUS
  Filled 2013-11-11 (×3): qty 10

## 2013-11-11 MED ORDER — ACETAMINOPHEN 325 MG PO TABS
650.0000 mg | ORAL_TABLET | Freq: Four times a day (QID) | ORAL | Status: DC | PRN
  Administered 2013-11-13 – 2013-11-14 (×3): 650 mg via ORAL
  Filled 2013-11-11 (×3): qty 2

## 2013-11-11 MED ORDER — INSULIN GLARGINE 100 UNIT/ML ~~LOC~~ SOLN
4.0000 [IU] | Freq: Every day | SUBCUTANEOUS | Status: DC
  Administered 2013-11-11 – 2013-11-13 (×3): 4 [IU] via SUBCUTANEOUS
  Filled 2013-11-11 (×4): qty 0.04

## 2013-11-11 MED ORDER — HEPARIN SODIUM (PORCINE) 5000 UNIT/ML IJ SOLN
5000.0000 [IU] | Freq: Three times a day (TID) | INTRAMUSCULAR | Status: DC
  Administered 2013-11-11 – 2013-11-14 (×10): 5000 [IU] via SUBCUTANEOUS
  Filled 2013-11-11 (×12): qty 1

## 2013-11-11 MED ORDER — CITALOPRAM HYDROBROMIDE 20 MG PO TABS
20.0000 mg | ORAL_TABLET | Freq: Every day | ORAL | Status: DC
  Administered 2013-11-11 – 2013-11-14 (×4): 20 mg via ORAL
  Filled 2013-11-11 (×4): qty 1

## 2013-11-11 MED ORDER — SODIUM CHLORIDE 0.9 % IJ SOLN
3.0000 mL | Freq: Two times a day (BID) | INTRAMUSCULAR | Status: DC
  Administered 2013-11-11 – 2013-11-13 (×5): 3 mL via INTRAVENOUS

## 2013-11-11 MED ORDER — NITROGLYCERIN 2 % TD OINT
1.0000 [in_us] | TOPICAL_OINTMENT | Freq: Once | TRANSDERMAL | Status: AC
  Administered 2013-11-11: 1 [in_us] via TOPICAL
  Filled 2013-11-11: qty 1

## 2013-11-11 MED ORDER — DEXTROSE 5 % IV SOLN
500.0000 mg | Freq: Once | INTRAVENOUS | Status: AC
  Administered 2013-11-11: 500 mg via INTRAVENOUS

## 2013-11-11 MED ORDER — FUROSEMIDE 10 MG/ML IJ SOLN
40.0000 mg | Freq: Two times a day (BID) | INTRAMUSCULAR | Status: DC
  Administered 2013-11-11 – 2013-11-12 (×3): 40 mg via INTRAVENOUS
  Filled 2013-11-11 (×4): qty 4

## 2013-11-11 MED ORDER — ONDANSETRON HCL 4 MG/2ML IJ SOLN
4.0000 mg | Freq: Four times a day (QID) | INTRAMUSCULAR | Status: DC | PRN
  Administered 2013-11-11: 4 mg via INTRAVENOUS
  Filled 2013-11-11: qty 2

## 2013-11-11 MED ORDER — ONDANSETRON HCL 4 MG PO TABS: 4.0000 mg | ORAL_TABLET | Freq: Four times a day (QID) | ORAL | Status: DC | PRN

## 2013-11-11 MED ORDER — HYDRALAZINE HCL 100 MG PO TABS: 100.0000 mg | ORAL_TABLET | Freq: Three times a day (TID) | ORAL | Status: DC

## 2013-11-11 MED ORDER — ASPIRIN 81 MG PO TABS: 81.0000 mg | ORAL_TABLET | Freq: Every day | ORAL | Status: DC

## 2013-11-11 MED ORDER — SIMVASTATIN 5 MG PO TABS
5.0000 mg | ORAL_TABLET | Freq: Every day | ORAL | Status: DC
  Administered 2013-11-11 – 2013-11-13 (×3): 5 mg via ORAL
  Filled 2013-11-11 (×4): qty 1

## 2013-11-11 MED ORDER — AMLODIPINE BESYLATE 5 MG PO TABS
5.0000 mg | ORAL_TABLET | Freq: Every day | ORAL | Status: DC
  Administered 2013-11-12 – 2013-11-14 (×3): 5 mg via ORAL
  Filled 2013-11-11 (×4): qty 1

## 2013-11-11 MED ORDER — ASPIRIN EC 81 MG PO TBEC
81.0000 mg | DELAYED_RELEASE_TABLET | Freq: Every day | ORAL | Status: DC
  Administered 2013-11-11 – 2013-11-14 (×4): 81 mg via ORAL
  Filled 2013-11-11 (×4): qty 1

## 2013-11-11 MED ORDER — ALLOPURINOL 100 MG PO TABS
100.0000 mg | ORAL_TABLET | Freq: Every day | ORAL | Status: DC
  Administered 2013-11-11 – 2013-11-14 (×4): 100 mg via ORAL
  Filled 2013-11-11 (×4): qty 1

## 2013-11-11 MED ORDER — PANTOPRAZOLE SODIUM 40 MG PO TBEC
40.0000 mg | DELAYED_RELEASE_TABLET | Freq: Every day | ORAL | Status: DC
  Administered 2013-11-11 – 2013-11-14 (×4): 40 mg via ORAL
  Filled 2013-11-11 (×4): qty 1

## 2013-11-11 MED ORDER — GABAPENTIN 600 MG PO TABS
600.0000 mg | ORAL_TABLET | Freq: Three times a day (TID) | ORAL | Status: DC
  Administered 2013-11-11 – 2013-11-13 (×6): 600 mg via ORAL
  Filled 2013-11-11 (×8): qty 1

## 2013-11-11 MED ORDER — ONDANSETRON 4 MG PO TBDP
4.0000 mg | ORAL_TABLET | Freq: Once | ORAL | Status: AC
  Administered 2013-11-11: 4 mg via ORAL
  Filled 2013-11-11: qty 1

## 2013-11-11 MED ORDER — DEXTROSE 5 % IV SOLN
1.0000 g | INTRAVENOUS | Status: DC
  Filled 2013-11-11: qty 10

## 2013-11-11 MED ORDER — ALBUTEROL SULFATE (2.5 MG/3ML) 0.083% IN NEBU
5.0000 mg | INHALATION_SOLUTION | Freq: Once | RESPIRATORY_TRACT | Status: AC
  Administered 2013-11-11: 5 mg via RESPIRATORY_TRACT
  Filled 2013-11-11: qty 6

## 2013-11-11 MED ORDER — DORZOLAMIDE HCL 2 % OP SOLN
1.0000 [drp] | Freq: Two times a day (BID) | OPHTHALMIC | Status: DC
  Administered 2013-11-11 – 2013-11-14 (×6): 1 [drp] via OPHTHALMIC
  Filled 2013-11-11: qty 10

## 2013-11-11 MED ORDER — LATANOPROST 0.005 % OP SOLN
1.0000 [drp] | Freq: Every day | OPHTHALMIC | Status: DC
  Administered 2013-11-11 – 2013-11-13 (×3): 1 [drp] via OPHTHALMIC
  Filled 2013-11-11: qty 2.5

## 2013-11-11 MED ORDER — HYDRALAZINE HCL 50 MG PO TABS
100.0000 mg | ORAL_TABLET | Freq: Three times a day (TID) | ORAL | Status: DC
  Administered 2013-11-11 – 2013-11-14 (×10): 100 mg via ORAL
  Filled 2013-11-11 (×13): qty 2

## 2013-11-11 MED ORDER — ATROPINE SULFATE 1 % OP SOLN
1.0000 [drp] | Freq: Two times a day (BID) | OPHTHALMIC | Status: DC
Start: 2013-11-11 — End: 2013-11-14
  Administered 2013-11-11 – 2013-11-14 (×7): 1 [drp] via OPHTHALMIC
  Filled 2013-11-11: qty 2

## 2013-11-11 MED ORDER — DORZOLAMIDE HCL-TIMOLOL MAL 2-0.5 % OP SOLN
1.0000 [drp] | Freq: Two times a day (BID) | OPHTHALMIC | Status: DC
  Filled 2013-11-11: qty 10

## 2013-11-11 MED ORDER — COLCHICINE 0.6 MG PO TABS
0.6000 mg | ORAL_TABLET | Freq: Two times a day (BID) | ORAL | Status: DC | PRN
  Filled 2013-11-11: qty 1

## 2013-11-11 MED ORDER — ACETAMINOPHEN 650 MG RE SUPP: 650.0000 mg | Freq: Four times a day (QID) | RECTAL | Status: DC | PRN

## 2013-11-11 MED ORDER — VITAMIN D (ERGOCALCIFEROL) 1.25 MG (50000 UNIT) PO CAPS: 50000.0000 [IU] | ORAL_CAPSULE | ORAL | Status: DC

## 2013-11-11 MED ORDER — DEXTROSE 5 % IV SOLN
500.0000 mg | INTRAVENOUS | Status: DC
  Administered 2013-11-12 – 2013-11-14 (×3): 500 mg via INTRAVENOUS
  Filled 2013-11-11 (×3): qty 500

## 2013-11-11 MED ORDER — ALBUTEROL SULFATE (2.5 MG/3ML) 0.083% IN NEBU: 2.5000 mg | INHALATION_SOLUTION | Freq: Four times a day (QID) | RESPIRATORY_TRACT | Status: DC | PRN

## 2013-11-11 MED ORDER — NITROGLYCERIN 0.4 MG SL SUBL: 0.4000 mg | SUBLINGUAL_TABLET | SUBLINGUAL | Status: DC | PRN

## 2013-11-11 NOTE — ED Notes (Signed)
Assisted Santiago Glad, RN as she placed a foley cath in pt

## 2013-11-11 NOTE — ED Provider Notes (Signed)
CSN: [id number]     Arrival date & time 11/11/13  0737 History   None    No chief complaint on file.  (Consider location/radiation/quality/duration/timing/severity/associated sxs/prior Treatment) HPI Comments: Patient with history of CHF and CAD presents to the ED with a chief complaint of SOB.  She states that the symptoms started this morning at around 2:00 am.  The symptoms have been persistent.  There are no aggravating or alleviating factors.  She has not taken anything to alleviate her symptoms. She denies being in any pain, specifically no chest pain.  She endorses associated nausea, but no vomiting.  Additionally, she says that she feels "hot in her back."  She denies any fevers, chills, cough, abdominal pain, or dysuria.  The history is provided by the patient. No language interpreter was used.    Past Medical History  Diagnosis Date  . Coronary artery disease   . Renal artery stenosis   . Hypertension   . Dyslipidemia   . GERD (gastroesophageal reflux disease)   . Tension headache   . Abdominal pain   . Anemia, unspecified   . Gout   . Back pain, chronic   . History of tonsillectomy   . History of cholecystectomy   . Depression   . IDDM (insulin dependent diabetes mellitus)   . Diabetes mellitus   . CHF (congestive heart failure)    Past Surgical History  Procedure Laterality Date  . Tonsillectomy    . Cholecystectomy  40 years ago   Family History  Problem Relation Age of Onset  . Coronary artery disease Father   . Hypertension Father   . Heart attack Father   . Hyperlipidemia Father   . Diabetes Sister   . Hypertension Sister   . Hyperlipidemia Sister   . Hypertension Brother   . Diabetes Brother   . Hyperlipidemia Brother   . Cancer Neg Hx     Breast and colon   History  Substance Use Topics  . Smoking status: Never Smoker   . Smokeless tobacco: Not on file  . Alcohol Use: No   OB History   Grav Para Term Preterm Abortions TAB SAB Ect Mult Living                  Review of Systems  All other systems reviewed and are negative.    Allergies  Review of patient's allergies indicates no known allergies.  Home Medications   Current Outpatient Rx  Name  Route  Sig  Dispense  Refill  . allopurinol (ZYLOPRIM) 100 MG tablet      TAKE 2 TABLETS BY MOUTH EVERY DAY FOR GOUT   60 tablet   5   . amLODipine (NORVASC) 5 MG tablet   Oral   Take 1 tablet (5 mg total) by mouth daily.   30 tablet   5     Please update our SPI # in your system, thanks   . aspirin 81 MG tablet   Oral   Take 81 mg by mouth daily.           Marland Kitchen atropine 1 % ophthalmic solution   Left Eye   Place 1 drop into the left eye 2 (two) times daily.         . bimatoprost (LUMIGAN) 0.03 % ophthalmic solution   Left Eye   Place 1 drop into the left eye daily.   2.5 mL   3   . brimonidine (ALPHAGAN P) 0.1 % SOLN  1 drop every 8 (eight) hours.         . cholecalciferol (VITAMIN D) 1000 UNITS tablet   Oral   Take 1 tablet (1,000 Units total) by mouth daily.   100 tablet   3   . citalopram (CELEXA) 20 MG tablet      TAKE 1 TABLET BY MOUTH EVERY DAY FOR MOOD   30 tablet   5   . colchicine 0.6 MG tablet   Oral   Take 1 tablet (0.6 mg total) by mouth as needed. Takes 2 tablets daily as needed for gout flareup.   30 tablet   0   . dorzolamide-timolol (COSOPT) 22.3-6.8 MG/ML ophthalmic solution   Both Eyes   Place 1 drop into both eyes 2 (two) times daily.         . furosemide (LASIX) 80 MG tablet   Oral   Take 0.5 tablets (40 mg total) by mouth daily.   30 tablet   3   . gabapentin (NEURONTIN) 600 MG tablet   Oral   Take 1 tablet (600 mg total) by mouth 3 (three) times daily.   90 tablet   5   . glucose blood (ACCU-CHEK SMARTVIEW) test strip      Use as instructed to check blood sugars 2 times per day dx code 250.00   100 each   12   . hydrALAZINE (APRESOLINE) 100 MG tablet      TAKE 1 TABLET BY MOUTH 3 TIMES A DAY   90  tablet   0     Pt needs to keep scheduled office visit on 10/26/13 ...   . Insulin Glargine (LANTUS SOLOSTAR) 100 UNIT/ML SOPN               . insulin lispro (HUMALOG) 100 UNIT/ML injection   Subcutaneous   Inject 10 Units into the skin 3 (three) times daily before meals.          . metoprolol tartrate (LOPRESSOR) 25 MG tablet   Oral   Take 3 tablets (75 mg total) by mouth 2 (two) times daily.   180 tablet   3   . nitroGLYCERIN (NITROSTAT) 0.4 MG SL tablet   Sublingual   Place 0.4 mg under the tongue as needed. For chest pain.          . pantoprazole (PROTONIX) 40 MG tablet      TAKE 1 TABLET BY MOUTH EVERY DAY FOR ACID REFLUX   30 tablet   5   . potassium chloride (K-DUR) 10 MEQ tablet   Oral   Take 2 tablets (20 mEq total) by mouth daily.   30 tablet   5   . pravastatin (PRAVACHOL) 80 MG tablet   Oral   Take 1 tablet (80 mg total) by mouth daily.   30 tablet   5   . saccharomyces boulardii (FLORASTOR) 250 MG capsule   Oral   Take 1 capsule (250 mg total) by mouth 2 (two) times daily.         . sodium chloride (MURO 128) 5 % ophthalmic solution   Left Eye   Place 1 drop into the left eye 2 (two) times daily.          There were no vitals taken for this visit. Physical Exam  Nursing note and vitals reviewed. Constitutional: She is oriented to person, place, and time. She appears well-developed and well-nourished.  obese  HENT:  Head: Normocephalic and atraumatic.  Eyes: Conjunctivae  and EOM are normal. Pupils are equal, round, and reactive to light.  Cataract right eye  Neck: Normal range of motion. Neck supple.  Cardiovascular: Normal rate and regular rhythm.  Exam reveals no gallop and no friction rub.   No murmur heard. Pulmonary/Chest: Effort normal. No respiratory distress. She has wheezes. She has no rales. She exhibits no tenderness.  Right-sided wheezes  Abdominal: Soft. Bowel sounds are normal. She exhibits no distension and no mass.  There is no tenderness. There is no rebound and no guarding.  No focal abdominal tenderness  Musculoskeletal: Normal range of motion. She exhibits edema. She exhibits no tenderness.  1+ bilateral lower extremity pitting edema  Neurological: She is alert and oriented to person, place, and time.  Skin: Skin is warm and dry.  Psychiatric: She has a normal mood and affect. Her behavior is normal. Judgment and thought content normal.    ED Course  Procedures (including critical care time) Labs Review Labs Reviewed - No data to display Imaging Review No results found.  EKG Interpretation    Date/Time:  Thursday November 11 2013 06:42:43 EST Ventricular Rate:  69 PR Interval:  168 QRS Duration: 87 QT Interval:  414 QTC Calculation: 443 R Axis:   53 Text Interpretation:  Sinus rhythm Nonspecific T abnormalities, lateral leads since last tracing no significant change Abnormal ekg Confirmed by MILLER  MD, BRIAN ([id number]) on 11/11/2013 6:49:47 AM            MDM   1. CHF (congestive heart failure)   2. Community acquired pneumonia     Patient with SOB that started early this morning.  Hx of CHF.  89% on RA.  Will check labs, place on supplemental O2, CXR, EKG, nebs for wheezing, will re-evaluate.  7:08 AM CXR shows worsening CHF.  Will give 40 mg IV lasix.  Doubt pneumonia, no fevers, chills, or cough, however, she does have an elevated WBC.  Patient discussed with [facility], who will admit the patient.  Treatment for pneumonia and CHF exacerbation.  Medications  cefTRIAXone (ROCEPHIN) 1 g in dextrose 5 % 50 mL IVPB (1 g Intravenous New Bag/Given 11/11/13 0823)  azithromycin (ZITHROMAX) 500 mg in dextrose 5 % 250 mL IVPB (not administered)  ipratropium (ATROVENT) nebulizer solution 0.5 mg (0.5 mg Nebulization Given 11/11/13 0653)  albuterol (PROVENTIL) (2.5 MG/3ML) 0.083% nebulizer solution 5 mg (5 mg Nebulization Given 11/11/13 0653)  ondansetron (ZOFRAN-ODT) disintegrating tablet 4 mg (4  mg Oral Given 11/11/13 0700)  furosemide (LASIX) injection 40 mg (40 mg Intravenous Given 11/11/13 0740)  nitroGLYCERIN (NITROGLYN) 2 % ointment 1 inch (1 inch Topical Given 11/11/13 0824)   Filed Vitals:   11/11/13 0719  BP: 178/70  Pulse: 67  Temp:   Resp: [id number], PA-C 11/11/13 [phone]

## 2013-11-11 NOTE — ED Notes (Signed)
Pt using bedpan-- states has to have a bowel movement-- requests to get up to [facility]-- explained to pt that she is too short of breath to get up,

## 2013-11-11 NOTE — ED Notes (Signed)
Pt had large brown soft stool on bedpan, reps easier-- states feels better.

## 2013-11-11 NOTE — Progress Notes (Signed)
11/11/2013 Pharmacy consulted to dose Rocephin for CAP.  Rocephin 1 gm given in ED 1/29 at 823am Zithromaix 500 mg IV given in ED 1/29 at 0912 am.   Rocephin does not require renal dose adjustment. Plan: Rocephin 1 gm IV q24 Eudelia Bunch, Pharm.D. [phone] 11/11/2013 11:46 AM

## 2013-11-11 NOTE — Progress Notes (Signed)
  Echocardiogram 2D Echocardiogram has been performed.  Basilia Jumbo 11/11/2013, 3:47 PM

## 2013-11-11 NOTE — ED Notes (Signed)
Pt woke up around 0200 feeling short of breath. Upon EMS arrival, pt was satting 89% on room air. Pt was placed on 4L of O2, and her sats increased to 96%. Pt denies chest pain.

## 2013-11-11 NOTE — H&P (Signed)
[facility] History and Physical  [name] DQQ:[id number] DOB: [birth year] DOA: 11/11/2013  Referring physician:  PCP: Adella Hare, MD  Specialists:   Chief Complaint: SOB, cough   HPI: [name] is a 80 y.o. female with PMH of IDDM, HTN, HPL, CAD, CHF presented with progressive SOB, DOE, leg edema, she also reports non productive cough; no fever, chills, no chest pain, had mild nausea no vomiting, no abdominal pain, no focal neuro weakness; ED work up showed, CHF, CAP  Review of Systems: The patient denies anorexia, fever, weight loss,, vision loss, decreased hearing, hoarseness, chest pain, syncope, dyspnea on exertion, peripheral edema, balance deficits, hemoptysis, abdominal pain, melena, hematochezia, severe indigestion/heartburn, hematuria, incontinence, genital sores, muscle weakness, suspicious skin lesions, transient blindness, difficulty walking, depression, unusual weight change, abnormal bleeding, enlarged lymph nodes, angioedema, and breast masses.    Past Medical History  Diagnosis Date  . Coronary artery disease   . Renal artery stenosis   . Hypertension   . Dyslipidemia   . GERD (gastroesophageal reflux disease)   . Tension headache   . Abdominal pain   . Anemia, unspecified   . Gout   . Back pain, chronic   . History of tonsillectomy   . History of cholecystectomy   . Depression   . IDDM (insulin dependent diabetes mellitus)   . Diabetes mellitus   . CHF (congestive heart failure)    Past Surgical History  Procedure Laterality Date  . Tonsillectomy    . Cholecystectomy  40 years ago   Social History:  reports that she has never smoked. She does not have any smokeless tobacco history on file. She reports that she does not drink alcohol or use illicit drugs. Home;  where does patient live--home, ALF, SNF? and with whom if at home? Yes;  Can patient participate in ADLs?  No Known Allergies  Family History  Problem Relation Age of Onset   . Coronary artery disease Father   . Hypertension Father   . Heart attack Father   . Hyperlipidemia Father   . Diabetes Sister   . Hypertension Sister   . Hyperlipidemia Sister   . Hypertension Brother   . Diabetes Brother   . Hyperlipidemia Brother   . Cancer Neg Hx     Breast and colon    (be sure to complete)  Prior to Admission medications   Medication Sig Start Date End Date Taking? Authorizing Provider  allopurinol (ZYLOPRIM) 100 MG tablet Take 200 mg by mouth daily.   Yes Historical Provider, MD  amLODipine (NORVASC) 5 MG tablet Take 1 tablet (5 mg total) by mouth daily. 09/07/13  Yes Elayne Snare, MD  aspirin 81 MG tablet Take 81 mg by mouth daily.     Yes Historical Provider, MD  atropine 1 % ophthalmic solution Place 1 drop into the left eye 2 (two) times daily.   Yes Historical Provider, MD  bimatoprost (LUMIGAN) 0.03 % ophthalmic solution Place 1 drop into the left eye daily. 05/11/13  Yes Estill Dooms, MD  cholecalciferol (VITAMIN D) 1000 UNITS tablet Take 1 tablet (1,000 Units total) by mouth daily. 01/29/13 01/29/14 Yes Evie Lacks Plotnikov, MD  citalopram (CELEXA) 20 MG tablet Take 20 mg by mouth daily.   Yes Historical Provider, MD  colchicine 0.6 MG tablet Take 0.6 mg by mouth 2 (two) times daily as needed (for gout flare up).   Yes Historical Provider, MD  dorzolamide-timolol (COSOPT) 22.3-6.8 MG/ML ophthalmic solution Place 1 drop into  both eyes 2 (two) times daily.   Yes Historical Provider, MD  furosemide (LASIX) 80 MG tablet Take 0.5 tablets (40 mg total) by mouth daily. 10/26/13  Yes Burtis Junes, NP  gabapentin (NEURONTIN) 600 MG tablet Take 1 tablet (600 mg total) by mouth 3 (three) times daily. 06/18/13  Yes Neena Rhymes, MD  hydrALAZINE (APRESOLINE) 100 MG tablet Take 100 mg by mouth 3 (three) times daily.   Yes Historical Provider, MD  insulin glargine (LANTUS) 100 UNIT/ML injection Inject 4 Units into the skin at bedtime.   Yes Historical Provider, MD   insulin lispro (HUMALOG) 100 UNIT/ML injection Inject 10 Units into the skin 3 (three) times daily before meals.    Yes Historical Provider, MD  metoprolol tartrate (LOPRESSOR) 25 MG tablet Take 3 tablets (75 mg total) by mouth 2 (two) times daily. 10/26/13  Yes Burtis Junes, NP  pantoprazole (PROTONIX) 40 MG tablet Take 40 mg by mouth daily.   Yes Historical Provider, MD  potassium chloride (K-DUR) 10 MEQ tablet Take 2 tablets (20 mEq total) by mouth daily. 06/18/13  Yes Neena Rhymes, MD  pravastatin (PRAVACHOL) 80 MG tablet Take 1 tablet (80 mg total) by mouth daily. 07/30/13  Yes Elayne Snare, MD  Vitamin D, Ergocalciferol, (DRISDOL) 50000 UNITS CAPS capsule Take 50,000 Units by mouth every 7 (seven) days. Take on wednesday   Yes Historical Provider, MD  glucose blood (ACCU-CHEK SMARTVIEW) test strip Use as instructed to check blood sugars 2 times per day dx code 250.00 06/16/13   Elayne Snare, MD  nitroGLYCERIN (NITROSTAT) 0.4 MG SL tablet Place 0.4 mg under the tongue as needed. For chest pain.     Historical Provider, MD   Physical Exam: Filed Vitals:   11/11/13 0945  BP: 157/73  Pulse: 59  Temp:   Resp: 19     General:  alert  Eyes: BL cataracts, eom-i   ENT: no oral ulcers   Neck: supple   Cardiovascular: s1,s2 rrr  Respiratory: few rales in LL  Abdomen: soft, obese, nt  Skin: echymosis   Musculoskeletal: le edema  Psychiatric: no hallucinations   Neurologic: BL cataracts deceraserd vision otherwise CN 2-12 seem intact, motor 5/5 BL   Labs on Admission:  Basic Metabolic Panel:  Recent Labs Lab 11/11/13 0655  NA 144  K 5.8*  CL 109  CO2 24  GLUCOSE 69*  BUN 30*  CREATININE 1.74*  CALCIUM 9.6   Liver Function Tests:  Recent Labs Lab 11/11/13 0655  AST 21  ALT 13  ALKPHOS 118*  BILITOT 0.3  PROT 7.7  ALBUMIN 4.0    Recent Labs Lab 11/11/13 0655  LIPASE 42   No results found for this basename: AMMONIA,  in the last 168  hours CBC:  Recent Labs Lab 11/11/13 0655  WBC 14.1*  NEUTROABS 12.0*  HGB 9.9*  HCT 31.2*  MCV 87.2  PLT 181   Cardiac Enzymes: No results found for this basename: CKTOTAL, CKMB, CKMBINDEX, TROPONINI,  in the last 168 hours  BNP (last 3 results)  Recent Labs  11/11/13 0655  PROBNP 1922.0*   CBG: No results found for this basename: GLUCAP,  in the last 168 hours  Radiological Exams on Admission: Dg Chest Portable 1 View  11/11/2013   CLINICAL DATA:  Shortness of breath.  EXAM: PORTABLE CHEST - 1 VIEW  COMPARISON:  03/01/2013.  FINDINGS: Cardiomegaly with pulmonary venous distention. Progressive bilateral pulmonary alveolar infiltrates are noted. These are most  consistent with pulmonary edema. Bilateral pneumonia cannot be excluded. No pleural effusion or pneumothorax.  IMPRESSION: Worsening congestive heart failure and pulmonary edema. Superimposed pneumonia cannot be excluded.   Electronically Signed   By: Marcello Moores  Register   On: 11/11/2013 07:01    EKG: Independently reviewed. NSR non specific st chnages   Assessment/Plan Active Problems:   IDDM   HYPERTENSION   CAD, NATIVE VESSEL   BACK PAIN, CHRONIC   Chronic kidney disease, stage IV (severe)   CHF (congestive heart failure)   Community acquired pneumonia  80 y.o. female with PMH of IDDM, HTN, HPL, CAD, CHF, CKD presented with progressive SOB, DOE, leg edema, she also reports non productive cough admitted with CHF, CAP  1. Acute on chronic CHF; CXR: pulmonary edema; echo (2012): LVEF [id number], diastolic dysfunction, mild pulmonary HTN -start IV diuresis; daily weight, I/O, monitor lytes; expect worsening renal function with underlying CKD; obtain echo  2. CAP, CXR: could not exclude underlying infiltrate, + leukocytosis  -started IV atx, prn bronchodilators, oxygen; pend blood c/s  3. CAD s/p PTCA (LAD-2006); ecg, initial trop unremarkable;  -resume BB, statin, ASA; monitor serial trop    4. CKD III with hyper K;  ECG: no acute changes; h/o short HD;  -cont diuresis; hold potassium, monitor lytes closely, urine output   5. DM, HA1C-5.9 -cont insulin regimen lantus, ISS; holding meal time insulin with worsening renal function; adjust as needed    None  if consultant consulted, please document name and whether formally or informally consulted  Code Status: full (must indicate code status--if unknown or must be presumed, indicate so) Family Communication: d/w patient, no family at the bedside; called [facility] Daughter [phone] no answer, try later  (indicate person spoken with, if applicable, with phone number if by telephone) Disposition Plan: pend clinical improvement  (indicate anticipated LOS)  Time spent: >35 minutes   [place], [place] [facility] Pager [phone]  If 7PM-7AM, please contact night-coverage [email or web address] Password [facility] 11/11/2013, 10:22 AM

## 2013-11-12 DIAGNOSIS — E119 Type 2 diabetes mellitus without complications: Secondary | ICD-10-CM

## 2013-11-12 DIAGNOSIS — N184 Chronic kidney disease, stage 4 (severe): Secondary | ICD-10-CM

## 2013-11-12 DIAGNOSIS — H409 Unspecified glaucoma: Secondary | ICD-10-CM

## 2013-11-12 DIAGNOSIS — R0602 Shortness of breath: Secondary | ICD-10-CM

## 2013-11-12 DIAGNOSIS — I10 Essential (primary) hypertension: Secondary | ICD-10-CM

## 2013-11-12 LAB — BASIC METABOLIC PANEL
BUN: 34 mg/dL — ABNORMAL HIGH (ref 6–23)
CALCIUM: 9.3 mg/dL (ref 8.4–10.5)
CO2: 26 meq/L (ref 19–32)
Chloride: 103 mEq/L (ref 96–112)
Creatinine, Ser: 2.12 mg/dL — ABNORMAL HIGH (ref 0.50–1.10)
GFR calc Af Amer: 24 mL/min — ABNORMAL LOW (ref >90)
GFR calc non Af Amer: 20 mL/min — ABNORMAL LOW (ref >90)
Glucose, Bld: 29 mg/dL — CL (ref 70–99)
Potassium: 4.8 mEq/L (ref 3.7–5.3)
Sodium: 143 mEq/L (ref 137–147)

## 2013-11-12 LAB — URINE CULTURE
COLONY COUNT: NO GROWTH
Culture: NO GROWTH

## 2013-11-12 LAB — CBC
HCT: 27 % — ABNORMAL LOW (ref 36.0–46.0)
Hemoglobin: 8.5 g/dL — ABNORMAL LOW (ref 12.0–15.0)
MCH: 26.8 pg (ref 26.0–34.0)
MCHC: 31.5 g/dL (ref 30.0–36.0)
MCV: 85.2 fL (ref 78.0–100.0)
PLATELETS: 124 10*3/uL — AB (ref 150–400)
RBC: 3.17 MIL/uL — AB (ref 3.87–5.11)
RDW: 18.6 % — ABNORMAL HIGH (ref 11.5–15.5)
WBC: 9.5 10*3/uL (ref 4.0–10.5)

## 2013-11-12 LAB — TROPONIN I

## 2013-11-12 LAB — GLUCOSE, CAPILLARY
GLUCOSE-CAPILLARY: 26 mg/dL — AB (ref 70–99)
GLUCOSE-CAPILLARY: 153 mg/dL — AB (ref 70–99)
Glucose-Capillary: 174 mg/dL — ABNORMAL HIGH (ref 70–99)
Glucose-Capillary: 136 mg/dL — ABNORMAL HIGH (ref 70–99)

## 2013-11-12 MED ORDER — BUDESONIDE 0.25 MG/2ML IN SUSP
0.2500 mg | Freq: Two times a day (BID) | RESPIRATORY_TRACT | Status: DC
Start: 2013-11-12 — End: 2013-11-14
  Administered 2013-11-12 – 2013-11-14 (×4): 0.25 mg via RESPIRATORY_TRACT
  Filled 2013-11-12 (×8): qty 2

## 2013-11-12 MED ORDER — FUROSEMIDE 10 MG/ML IJ SOLN
40.0000 mg | Freq: Every day | INTRAMUSCULAR | Status: DC
  Administered 2013-11-13: 40 mg via INTRAVENOUS
  Filled 2013-11-12: qty 4

## 2013-11-12 MED ORDER — DEXTROSE 50 % IV SOLN
INTRAVENOUS | Status: AC
  Administered 2013-11-12: 50 mL
  Filled 2013-11-12: qty 50

## 2013-11-12 MED ORDER — DEXTROSE 50 % IV SOLN: 50.0000 mL | Freq: Once | INTRAVENOUS | Status: AC | PRN

## 2013-11-12 NOTE — ED Provider Notes (Signed)
I personally performed the services described in this documentation, which was scribed in my presence. The recorded information has been reviewed and is accurate. Patient with past medical history of CHF and coronary artery disease who presents with shortness of breath. She states this started in the night and have persisted and worsened. She denies any chest pain, fevers, productive cough. She denies having missed any of her regular medications.  On exam vitals are stable and the patient is afebrile. She is hypoxic with oxygen saturations of 89%. Head is atraumatic normocephalic. Heart is regular rate and rhythm without murmurs. Lungs are noted to have rales in the bases. Extremities with 1+ edema. Abdomen is soft, nontender.  Workup reveals what appears to be an exacerbation of congestive heart failure along with a superimposed pneumonia. She will be treated with Lasix and antibiotics. Due to her hypoxia and oxygen requirement, she will require admission. The hospitalist has been called for admission.   Date: 11/12/2013  Rate: 67  Rhythm: normal sinus rhythm  QRS Axis: normal  Intervals: normal  ST/T Wave abnormalities: nonspecific T wave changes  Conduction Disutrbances:none  Narrative Interpretation:   Old EKG Reviewed: unchanged    Veryl Speak, MD 11/12/13 [phone]

## 2013-11-12 NOTE — Evaluation (Signed)
Physical Therapy Evaluation Patient Details Name: [name] MRN: [id number] DOB: [birth year] Today's Date: 11/12/2013 Time: 3845-3646 PT Time Calculation (min): 14 min  PT Assessment / Plan / Recommendation History of Present Illness   [name] is a 80 y.o. female with PMH of IDDM, HTN, HPL, CAD, CHF presented with progressive SOB, DOE, leg edema, she also reports non productive cough; no fever, chills, no chest pain, had mild nausea no vomiting, no abdominal pain, no focal neuro weakness; ED work up showed, CHF, CAP  Clinical Impression  Pt adm due to the above. Presents with decreased independence with functional mobility secondary to deficits indicated below (see PT problem list). Pt to benefit form skilled acute PT to increase mobility and transition to home with son. Will recommend HHPT to ensure safe transition and increase mobility, balance and strengthening exercises in home setting.     PT Assessment  Patient needs continued PT services    Follow Up Recommendations  Home health PT;Supervision/Assistance - 24 hour    Does the patient have the potential to tolerate intense rehabilitation      Barriers to Discharge   pt has 24/7 (A) per son     Equipment Recommendations  None recommended by PT    Recommendations for Other Services OT consult   Frequency Min 3X/week    Precautions / Restrictions Precautions Precautions: Fall Precaution Comments: pt and son denies any recent falls; pt is unsteady at this time  Restrictions Weight Bearing Restrictions: No   Pertinent Vitals/Pain Stable t/o session.      Mobility  Bed Mobility Overal bed mobility: Modified Independent General bed mobility comments: pt requries incr time but no physical (A); bed mobility is effortful for her Transfers Overall transfer level: Needs assistance Equipment used: 1 person hand held assist Transfers: Sit to/from Stand Sit to Stand: Min assist General transfer comment: (A) to  maintain balance with transfers; pt bracing LEs against bed to support; cues for upright posture  Ambulation/Gait Ambulation/Gait assistance: Min assist Ambulation Distance (Feet): 10 Feet Assistive device: 1 person hand held assist Gait Pattern/deviations: Decreased stride length;Wide base of support Gait velocity: decreased Gait velocity interpretation: Below normal speed for age/gender General Gait Details: pt amblulated with lateral steps in each direction; forward and backwards steps; pt unsteady on her feet and demo some LE tremors due to fatigue and weakness; will benefit from ambulating with RW to improve mobility and give UE support; cues for sequencing and safety     Exercises General Exercises - Lower Extremity Ankle Circles/Pumps: AROM;Both;10 reps;Supine Heel Slides: AROM;Both;5 reps;Supine Straight Leg Raises: AROM;Both;5 reps   PT Diagnosis: Difficulty walking;Generalized weakness  PT Problem List: Decreased strength;Decreased activity tolerance;Decreased balance;Decreased mobility;Decreased knowledge of use of DME;Decreased safety awareness;Cardiopulmonary status limiting activity PT Treatment Interventions: DME instruction;Gait training;Therapeutic activities;Therapeutic exercise;Balance training;Neuromuscular re-education;Patient/family education;Functional mobility training     PT Goals(Current goals can be found in the care plan section) Acute Rehab PT Goals Patient Stated Goal: to go home when im breathing right PT Goal Formulation: With patient/family Time For Goal Achievement: 11/26/13 Potential to Achieve Goals: Good  Visit Information  Last PT Received On: 11/12/13 Assistance Needed: +1 History of Present Illness:  [name] is a 80 y.o. female with PMH of IDDM, HTN, HPL, CAD, CHF presented with progressive SOB, DOE, leg edema, she also reports non productive cough; no fever, chills, no chest pain, had mild nausea no vomiting, no abdominal pain, no focal  neuro weakness; ED work up  showed, CHF, CAP       Prior [place] expects to be discharged to:: Private residence Living Arrangements: Children Available Help at Discharge: Family;Available 24 hours/day Type of Home: House Home Access: Level entry Home Layout: One level Home Equipment: Walker - 2 wheels;Shower seat Prior Function Level of Independence: Independent with assistive device(s) Comments: pt and family reports pt ambulates with RW when needed; in house at times may not use RW; pt is independent with ADLs; son is responsible for house upkeep and cooking Communication Communication: No difficulties Dominant Hand: Right    Cognition  Cognition Arousal/Alertness: Awake/alert Behavior During Therapy: WFL for tasks assessed/performed Overall Cognitive Status: Within Functional Limits for tasks assessed    Extremity/Trunk Assessment Upper Extremity Assessment Upper Extremity Assessment: Defer to OT evaluation Lower Extremity Assessment Lower Extremity Assessment: Generalized weakness Cervical / Trunk Assessment Cervical / Trunk Assessment: Normal   Balance Balance Overall balance assessment: Needs assistance Sitting-balance support: Feet supported;Single extremity supported Sitting balance-Leahy Scale: Good Standing balance support: During functional activity;Single extremity supported Standing balance-Leahy Scale: Poor Standing balance comment: pt requries support to maintain balance and was bracing LEs against bed for stabilization   End of Session PT - End of Session Equipment Utilized During Treatment: Gait belt;Oxygen (3L O2 ) Activity Tolerance: Patient tolerated treatment well Patient left: in bed;with call bell/phone within reach;with family/visitor present Nurse Communication: Mobility status;Precautions  GP     Gustavus Bryant, [place] 11/12/2013, 5:12 PM

## 2013-11-12 NOTE — Care Management Note (Addendum)
    Page 1 of 2   11/15/2013     12:16:01 PM   CARE MANAGEMENT NOTE 11/15/2013  Patient:  [name], [name]   Account Number:  [id number]  Date Initiated:  11/12/2013  Documentation initiated by:  HUTCHINSON,CRYSTAL  Subjective/Objective Assessment:   Admitted with CHF     Action/Plan:   CM will follow for disposition needs   Anticipated DC Date:  11/15/2013   Anticipated DC Plan:  [place]  CM consult      [facility] Choice  HOME HEALTH   Choice offered to / List presented to:  [name]        [place] arranged  HH-1 RN  [place].   Status of service:  Completed, signed off Medicare Important Message given?   (If response is "NO", the following Medicare IM given date fields will be blank) Date Medicare IM given:   Date Additional Medicare IM given:    Discharge Disposition:  [place]  Per UR Regulation:  Reviewed for med. necessity/level of care/duration of stay  If discussed at [place] of Stay Meetings, dates discussed:    Comments:  11/15/2013 TC update from [facility] stating member has refused HHS. States son instructed member she would be billed for services even though member was instructed by IDT that MCR/[employer] would cover HHS. Services refused:  RN, PT and hx/o [place] Cath. Crystal Hutchinson RN, BSN, [place], CCM 11/15/2013  11/13/13 13:10 CM met with pt in room to offer choice.  Pt chooses [facility] for HHPT/RN.  Pt states she is unsure she will need it as she states her sone takes good care of her.  CM explained going home can be an exhausting transition and HHPT/RN can be cancelled at anytime she no longer feels she needs their help but she agreed it was a good idea to set it up and have them in place for discharge.  Address and contact number verified with pt and referral to [facility] faxed. No other CM needs were communicated.  [name], BSN, [place] [phone].   11/12/2013 Social:   From home with son IV Lasix Crystal Hutchinson RN, BSN, [place], [place] ([place] [phone] 11/12/2013

## 2013-11-12 NOTE — Progress Notes (Addendum)
[facility] PROGRESS NOTE  [name] [id number] DOB: August 13, 1928 DOA: 11/11/2013 PCP: Adella Hare, MD  Assessment/Plan: 1. Acute on chronic CHF; CXR: pulmonary edema; echo (2012): LVEF [id number], diastolic dysfunction, mild pulmonary HTN  -good diuresis (4L neg overnight); patient breathing more comfortable  -will change lasix to once a day -continue daily BMET to monitor renal function and electrolytes  -daily weight and strict I's and O's -2-D echo with preserved EF, no wall motion abnormalities and grade 2 diastolic dysfunction   2. CAP, CXR: could not exclude underlying infiltrate, + leukocytosis  -continue IV antibiotics -start pulmicort  3. CAD s/p PTCA (LAD-2006); ecg w/o ischemic changes -troponin neg -continue BB, statin, ASA -no CP  4. CKD III-IV with hyper K; ECG: no acute changes  -cont diuresis and follow BMET daily -potassium WNL now -Cr essentially stable by GFR   5. DM, HA1C-5.9  -cont insulin regimen lantus, ISS  6.glaucoma: continue home eye drops.  [id number]   Code Status: Full Family Communication: no family at bedside  Disposition Plan: to be determine   Consultants:  None   Procedures:  2-D echo:  - Left ventricle: The cavity size was normal. Wall thickness was increased increased in a pattern of mild to moderate LVH. Systolic function was normal. The estimated ejection fraction was in the range of 55% to 60%. Wall motion was normal; there were no regional wall motion abnormalities. Features are consistent with a pseudonormal left ventricular filling pattern, with concomitant abnormal relaxation and increased filling pressure (grade 2 diastolic dysfunction). - Aortic valve: There is calcification above the valve and what may be either dense calcium or a possible supravalvular membrane. Trileaflet; moderately thickened, moderately calcified leaflets. Cusp separation was mildly reduced. Valve mobility was mildly  restricted. Transvalvular velocity was increased. There was mild to moderate stenosis. Valve area: 1.24cm^2(VTI). Valve area: 1.33cm^2 (Vmax). - Mitral valve: Mildly calcified annulus. Mild regurgitation. - Left atrium: The atrium was moderately to severely dilated. - Pulmonary arteries: Systolic pressure was moderately increased. PA peak pressure: 49mm Hg (S).  Antibiotics:  Rocephin and zithromax   HPI/Subjective: Patient breathing better and feeling more comfortable today. Neg 4L overnight.  Objective: Filed Vitals:   11/12/13 0934  BP: 127/50  Pulse:   Temp:   Resp:     Intake/Output Summary (Last 24 hours) at 11/12/13 1133 Last data filed at 11/12/13 0920  Gross per 24 hour  Intake    600 ml  Output   4900 ml  Net  -4300 ml   Filed Weights   11/11/13 1102  Weight: 89.449 kg (197 lb 3.2 oz)    Exam:   General:  Breathing better, denies CP and is afebrile  Cardiovascular: S1 and S2, no rubs or gallops; positive SEM  Respiratory: decrease breath sounds at bases, no wheezing   Abdomen: soft, NT, ND, positive BS  Musculoskeletal: trace to 1 plus edema bilaterally, no cyanosis  Data Reviewed: Basic Metabolic Panel:  Recent Labs Lab 11/11/13 0655 11/11/13 1220 11/12/13 0405  NA 144  --  143  K 5.8*  --  4.8  CL 109  --  103  CO2 24  --  26  GLUCOSE 69*  --  29*  BUN 30*  --  34*  CREATININE 1.74* 1.69* 2.12*  CALCIUM 9.6  --  9.3   Liver Function Tests:  Recent Labs Lab 11/11/13 0655  AST 21  ALT 13  ALKPHOS 118*  BILITOT 0.3  PROT 7.7  ALBUMIN 4.0  Recent Labs Lab 11/11/13 0655  LIPASE 42   CBC:  Recent Labs Lab 11/11/13 0655 11/11/13 1220 11/12/13 0405  WBC 14.1* 13.4* 9.5  NEUTROABS 12.0*  --   --   HGB 9.9* 9.7* 8.5*  HCT 31.2* 30.8* 27.0*  MCV 87.2 86.0 85.2  PLT 181 130* 124*   Cardiac Enzymes:  Recent Labs Lab 11/11/13 1220 11/11/13 2127 11/12/13 0405  TROPONINI <0.30 <0.30 <0.30   BNP (last 3  results)  Recent Labs  11/11/13 0655  PROBNP 1922.0*   CBG:  Recent Labs Lab 11/11/13 1607 11/11/13 1837 11/11/13 2126 11/12/13 0529 11/12/13 0547  GLUCAP 76 110* 94 26* 153*    Recent Results (from the past 240 hour(s))  URINE CULTURE     Status: None   Collection Time    11/11/13  8:00 AM      Result Value Range Status   Specimen Description URINE, CATHETERIZED   Final   Special Requests NONE   Final   Culture  Setup Time     Final   Value: 11/11/2013 14:14     Performed at [place]     Final   Value: NO GROWTH     Performed at [facility]   Culture     Final   Value: NO GROWTH     Performed at [facility]   Report Status 11/12/2013 FINAL   Final  CULTURE, BLOOD (ROUTINE X 2)     Status: None   Collection Time    11/11/13  8:10 AM      Result Value Range Status   Specimen Description BLOOD HAND LEFT   Final   Special Requests BOTTLES DRAWN AEROBIC ONLY 5CC   Final   Culture  Setup Time     Final   Value: 11/11/2013 13:16     Performed at [facility]   Culture     Final   Value:        BLOOD CULTURE RECEIVED NO GROWTH TO DATE CULTURE WILL BE HELD FOR 5 DAYS BEFORE ISSUING A FINAL NEGATIVE REPORT     Performed at [facility]   Report Status PENDING   Incomplete  CULTURE, BLOOD (ROUTINE X 2)     Status: None   Collection Time    11/11/13  8:20 AM      Result Value Range Status   Specimen Description BLOOD ARM LEFT   Final   Special Requests BOTTLES DRAWN AEROBIC ONLY 5CC   Final   Culture  Setup Time     Final   Value: 11/11/2013 13:16     Performed at [facility]   Culture     Final   Value:        BLOOD CULTURE RECEIVED NO GROWTH TO DATE CULTURE WILL BE HELD FOR 5 DAYS BEFORE ISSUING A FINAL NEGATIVE REPORT     Performed at [facility]   Report Status PENDING   Incomplete  MRSA PCR SCREENING     Status: None   Collection Time    11/11/13  8:20 PM      Result Value Range  Status   MRSA by PCR NEGATIVE  NEGATIVE Final   Comment:            The GeneXpert MRSA Assay (FDA     approved for NASAL specimens     only), is one component of a     comprehensive MRSA  colonization     surveillance program. It is not     intended to diagnose MRSA     infection nor to guide or     monitor treatment for     MRSA infections.     Studies: Dg Chest Portable 1 View  11/11/2013   CLINICAL DATA:  Shortness of breath.  EXAM: PORTABLE CHEST - 1 VIEW  COMPARISON:  03/01/2013.  FINDINGS: Cardiomegaly with pulmonary venous distention. Progressive bilateral pulmonary alveolar infiltrates are noted. These are most consistent with pulmonary edema. Bilateral pneumonia cannot be excluded. No pleural effusion or pneumothorax.  IMPRESSION: Worsening congestive heart failure and pulmonary edema. Superimposed pneumonia cannot be excluded.   Electronically Signed   By: Marcello Moores  Register   On: 11/11/2013 07:01    Scheduled Meds: . allopurinol  100 mg Oral Daily  . amLODipine  5 mg Oral Daily  . aspirin EC  81 mg Oral Daily  . atropine  1 drop Left Eye BID  . azithromycin  500 mg Intravenous Q24H  . cefTRIAXone (ROCEPHIN)  IV  1 g Intravenous Q24H  . citalopram  20 mg Oral Daily  . dorzolamide  1 drop Both Eyes BID   And  . timolol  1 drop Both Eyes BID  . furosemide  40 mg Intravenous BID  . gabapentin  600 mg Oral TID  . heparin  5,000 Units Subcutaneous Q8H  . hydrALAZINE  100 mg Oral Q8H  . insulin aspart  0-9 Units Subcutaneous TID WC  . insulin glargine  4 Units Subcutaneous QHS  . latanoprost  1 drop Both Eyes QHS  . metoprolol tartrate  75 mg Oral BID  . pantoprazole  40 mg Oral Daily  . simvastatin  5 mg Oral q1800  . sodium chloride  3 mL Intravenous Q12H  . [START ON 11/17/2013] Vitamin D (Ergocalciferol)  50,000 Units Oral Q7 days    Time spent: > 30 minutes   Erdine Hulen  [facility] Pager [phone]. If 7PM-7AM, please contact night-coverage at  [email or web address], password [facility] 11/12/2013, 11:33 AM  LOS: 1 day

## 2013-11-12 NOTE — Progress Notes (Signed)
Inpatient Diabetes Program Recommendations  AACE/ADA: New Consensus Statement on Inpatient Glycemic Control (2013)  Target Ranges:  Prepandial:   less than 140 mg/dL      Peak postprandial:   less than 180 mg/dL (1-2 hours)      Critically ill patients:  140 - 180 mg/dL   Please consider using only sensitive correction tidwc and discontinue the Lantus for now. Thank you, Rosita Kea, RN, CNS, Diabetes Coordinator [phone])

## 2013-11-12 NOTE — Progress Notes (Signed)
UR completed Terrye Dombrosky K. Michon Kaczmarek, RN, BSN, [place], CCM  11/12/2013 10:07 AM

## 2013-11-12 NOTE — Progress Notes (Signed)
Hypoglycemic Event  CBG: 26  Treatment: D50 IV 50 mL  Symptoms: Sweaty  Follow-up CBG: [id number] CBG Result:153  Possible Reasons for Event: Unknown  Comments/MD notified: Schorr, NP notified.     Jerline Pain  Remember to initiate Hypoglycemia Order Set & complete

## 2013-11-12 NOTE — Progress Notes (Signed)
Patient hemoglobin this am is 8.5. NP Schorr notified. RN will continue to monitor. Shellee Milo, RN

## 2013-11-13 ENCOUNTER — Inpatient Hospital Stay (HOSPITAL_COMMUNITY): Payer: Medicare Other

## 2013-11-13 LAB — GLUCOSE, CAPILLARY
GLUCOSE-CAPILLARY: 141 mg/dL — AB (ref 70–99)
Glucose-Capillary: 150 mg/dL — ABNORMAL HIGH (ref 70–99)
Glucose-Capillary: 97 mg/dL (ref 70–99)
Glucose-Capillary: 122 mg/dL — ABNORMAL HIGH (ref 70–99)
Glucose-Capillary: 100 mg/dL — ABNORMAL HIGH (ref 70–99)

## 2013-11-13 MED ORDER — FUROSEMIDE 40 MG PO TABS
60.0000 mg | ORAL_TABLET | Freq: Every day | ORAL | Status: DC
  Administered 2013-11-14: 60 mg via ORAL
  Filled 2013-11-13: qty 1

## 2013-11-13 MED ORDER — GABAPENTIN 600 MG PO TABS
600.0000 mg | ORAL_TABLET | Freq: Two times a day (BID) | ORAL | Status: DC
  Administered 2013-11-13 – 2013-11-14 (×2): 600 mg via ORAL
  Filled 2013-11-13 (×3): qty 1

## 2013-11-13 NOTE — Progress Notes (Signed)
[facility] PROGRESS NOTE  [name]:[id number] DOB: 05-Aug-1928 DOA: 11/11/2013 PCP: Adella Hare, MD  Assessment/Plan: 1. Acute on chronic CHF; CXR: pulmonary edema and vasc congestion. -good diuresis (patient now 5.3 L neg total); patient breathing good and w/o CP  -will change lasix to PO (60mg  daily) -BMET and BNP in am -daily weight and strict I's and O's -2-D echo with preserved EF, no wall motion abnormalities and grade 2 diastolic dysfunction -continue low sodium diet   2. CAP, CXR: could not exclude underlying infiltrate, + leukocytosis  -continue IV antibiotics X 1 more day -continue pulmicort -wean oxygen to RA  3. CAD s/p PTCA (LAD-2006); ecg w/o ischemic changes -troponin neg -continue BB, statin, ASA -no CP  4. CKD III-IV with hyper K; ECG: no acute changes  -cont diuresis and follow BMET daily to monitor function and electrolytes -potassium WNL now -Cr essentially stable by GFR   5. DM, HA1C-5.9  -cont insulin regimen lantus, ISS  6. glaucoma: continue home eye drops.  7. Physical deconditioning: appreciate PT eval and rec's. Will arrange HHPT and HHRN at discharge. Patient with family assistance, care and supervision 24/7  DGL:[id number]   Code Status: Full Family Communication: no family at bedside  Disposition Plan: home with home services when medically stable  Consultants:  None   Procedures:  2-D echo:  - Left ventricle: The cavity size was normal. Wall thickness was increased increased in a pattern of mild to moderate LVH. Systolic function was normal. The estimated ejection fraction was in the range of 55% to 60%. Wall motion was normal; there were no regional wall motion abnormalities. Features are consistent with a pseudonormal left ventricular filling pattern, with concomitant abnormal relaxation and increased filling pressure (grade 2 diastolic dysfunction). - Aortic valve: There is calcification above the valve  and what may be either dense calcium or a possible supravalvular membrane. Trileaflet; moderately thickened, moderately calcified leaflets. Cusp separation was mildly reduced. Valve mobility was mildly restricted. Transvalvular velocity was increased. There was mild to moderate stenosis. Valve area: 1.24cm^2(VTI). Valve area: 1.33cm^2 (Vmax). - Mitral valve: Mildly calcified annulus. Mild regurgitation. - Left atrium: The atrium was moderately to severely dilated. - Pulmonary arteries: Systolic pressure was moderately increased. PA peak pressure: 5mm Hg (S).  Antibiotics:  Rocephin and zithromax   HPI/Subjective: Patient breathing and feeling a lot better. No CP, no abd pain, nausea or vomiting. Afebrile.  Objective: Filed Vitals:   11/13/13 0900  BP: 117/48  Pulse: 55  Temp: 98 F (36.7 C)  Resp: 20    Intake/Output Summary (Last 24 hours) at 11/13/13 1130 Last data filed at 11/13/13 0900  Gross per 24 hour  Intake    818 ml  Output   1825 ml  Net  -1007 ml   Filed Weights   11/11/13 1102 11/13/13 0352  Weight: 89.449 kg (197 lb 3.2 oz) 84.414 kg (186 lb 1.6 oz)    Exam:   General:  Breathing better, denies CP and is afebrile  Cardiovascular: S1 and S2, no rubs or gallops; positive SEM  Respiratory: improved air movements, no frank crackles, no rales and no wheezing   Abdomen: soft, NT, ND, positive BS  Musculoskeletal: trace to 1 plus edema bilaterally, no cyanosis  Data Reviewed: Basic Metabolic Panel:  Recent Labs Lab 11/11/13 0655 11/11/13 1220 11/12/13 0405  NA 144  --  143  K 5.8*  --  4.8  CL 109  --  103  CO2 24  --  26  GLUCOSE 69*  --  29*  BUN 30*  --  34*  CREATININE 1.74* 1.69* 2.12*  CALCIUM 9.6  --  9.3   Liver Function Tests:  Recent Labs Lab 11/11/13 0655  AST 21  ALT 13  ALKPHOS 118*  BILITOT 0.3  PROT 7.7  ALBUMIN 4.0    Recent Labs Lab 11/11/13 0655  LIPASE 42   CBC:  Recent Labs Lab 11/11/13 0655  11/11/13 1220 11/12/13 0405  WBC 14.1* 13.4* 9.5  NEUTROABS 12.0*  --   --   HGB 9.9* 9.7* 8.5*  HCT 31.2* 30.8* 27.0*  MCV 87.2 86.0 85.2  PLT 181 130* 124*   Cardiac Enzymes:  Recent Labs Lab 11/11/13 1220 11/11/13 2127 11/12/13 0405  TROPONINI <0.30 <0.30 <0.30   BNP (last 3 results)  Recent Labs  11/11/13 0655  PROBNP 1922.0*   CBG:  Recent Labs Lab 11/12/13 1132 11/12/13 1556 11/12/13 2206 11/13/13 0545 11/13/13 1110  GLUCAP 174* 136* 150* 97 122*    Recent Results (from the past 240 hour(s))  URINE CULTURE     Status: None   Collection Time    11/11/13  8:00 AM      Result Value Range Status   Specimen Description URINE, CATHETERIZED   Final   Special Requests NONE   Final   Culture  Setup Time     Final   Value: 11/11/2013 14:14     Performed at [facility] Count     Final   Value: NO GROWTH     Performed at [facility]   Culture     Final   Value: NO GROWTH     Performed at [facility]   Report Status 11/12/2013 FINAL   Final  CULTURE, BLOOD (ROUTINE X 2)     Status: None   Collection Time    11/11/13  8:10 AM      Result Value Range Status   Specimen Description BLOOD HAND LEFT   Final   Special Requests BOTTLES DRAWN AEROBIC ONLY 5CC   Final   Culture  Setup Time     Final   Value: 11/11/2013 13:16     Performed at [facility]   Culture     Final   Value:        BLOOD CULTURE RECEIVED NO GROWTH TO DATE CULTURE WILL BE HELD FOR 5 DAYS BEFORE ISSUING A FINAL NEGATIVE REPORT     Performed at [facility]   Report Status PENDING   Incomplete  CULTURE, BLOOD (ROUTINE X 2)     Status: None   Collection Time    11/11/13  8:20 AM      Result Value Range Status   Specimen Description BLOOD ARM LEFT   Final   Special Requests BOTTLES DRAWN AEROBIC ONLY 5CC   Final   Culture  Setup Time     Final   Value: 11/11/2013 13:16     Performed at [facility]   Culture     Final   Value:         BLOOD CULTURE RECEIVED NO GROWTH TO DATE CULTURE WILL BE HELD FOR 5 DAYS BEFORE ISSUING A FINAL NEGATIVE REPORT     Performed at [facility]   Report Status PENDING   Incomplete  MRSA PCR SCREENING     Status: None   Collection Time    11/11/13  8:20 PM  Result Value Range Status   MRSA by PCR NEGATIVE  NEGATIVE Final   Comment:            The GeneXpert MRSA Assay (FDA     approved for NASAL specimens     only), is one component of a     comprehensive MRSA colonization     surveillance program. It is not     intended to diagnose MRSA     infection nor to guide or     monitor treatment for     MRSA infections.     Studies: No results found.  Scheduled Meds: . allopurinol  100 mg Oral Daily  . amLODipine  5 mg Oral Daily  . aspirin EC  81 mg Oral Daily  . atropine  1 drop Left Eye BID  . azithromycin  500 mg Intravenous Q24H  . budesonide (PULMICORT) nebulizer solution  0.25 mg Nebulization BID  . cefTRIAXone (ROCEPHIN)  IV  1 g Intravenous Q24H  . citalopram  20 mg Oral Daily  . dorzolamide  1 drop Both Eyes BID   And  . timolol  1 drop Both Eyes BID  . [START ON 11/14/2013] furosemide  60 mg Oral Daily  . gabapentin  600 mg Oral TID  . heparin  5,000 Units Subcutaneous Q8H  . hydrALAZINE  100 mg Oral Q8H  . insulin aspart  0-9 Units Subcutaneous TID WC  . insulin glargine  4 Units Subcutaneous QHS  . latanoprost  1 drop Both Eyes QHS  . metoprolol tartrate  75 mg Oral BID  . pantoprazole  40 mg Oral Daily  . simvastatin  5 mg Oral q1800  . sodium chloride  3 mL Intravenous Q12H  . [START ON 11/17/2013] Vitamin D (Ergocalciferol)  50,000 Units Oral Q7 days    Time spent: > 30 minutes   Elexis Pollak  [facility] Pager [phone]. If 7PM-7AM, please contact night-coverage at [email or web address], password [facility] 11/13/2013, 11:30 AM  LOS: 2 days

## 2013-11-13 NOTE — Progress Notes (Signed)
   CARE MANAGEMENT NOTE 11/13/2013  Patient:  [name], [name]   Account Number:  [id number]  Date Initiated:  11/12/2013  Documentation initiated by:  HUTCHINSON,CRYSTAL  Subjective/Objective Assessment:   Admitted with CHF     Action/Plan:   CM will follow for disposition needs   Anticipated DC Date:  11/15/2013   Anticipated DC Plan:  [name]  CM consult      [facility] Choice  HOME HEALTH   Choice offered to / List presented to:  [name]        [place] arranged  HH-1 RN  [place].   Status of service:  Completed, signed off Medicare Important Message given?   (If response is "NO", the following Medicare IM given date fields will be blank) Date Medicare IM given:   Date Additional Medicare IM given:    Discharge Disposition:  [place]  Per UR Regulation:  Reviewed for med. necessity/level of care/duration of stay  If discussed at [place] of Stay Meetings, dates discussed:    Comments:  11/13/13 13:10 CM met with pt in room to offer choice.  Pt chooses [facility] for HHPT/RN.  Pt states she is unsure she will need it as she states her sone takes good care of her.  CM explained going home can be an exhausting transition and HHPT/RN can be cancelled at anytime she no longer feels she needs their help but she agreed it was a good idea to set it up and have them in place for discharge.  Address and contact number verified with pt and referral to [facility] faxed. No other CM needs were communicated.  Mariane Masters, BSN, [place] [phone].   11/12/2013 Social:  From home with son IV Lasix Crystal Hutchinson RN, BSN, [place], [place] ([place] [phone] 11/12/2013

## 2013-11-13 NOTE — Progress Notes (Signed)
PT Cancellation Note  Patient Details Name: [name] MRN: [id number] DOB: Jun 29, 1928   Cancelled Treatment:    Reason Eval/Treat Not Completed: Fatigue/lethargy limiting ability to participate. Patient had just returned from xray and stated that they moved her all around down there and she was tired and didn't feel like trying to walk. Will follow up as able.    Jacqualyn Posey 11/13/2013, 1:19 PM

## 2013-11-13 NOTE — Progress Notes (Signed)
Foley = 14 FR. d/c'd as ordered.  Pt tolerated well.  Will continue to monitor.  Karie Kirks, Therapist, sports.

## 2013-11-14 DIAGNOSIS — M109 Gout, unspecified: Secondary | ICD-10-CM

## 2013-11-14 DIAGNOSIS — R5381 Other malaise: Secondary | ICD-10-CM

## 2013-11-14 DIAGNOSIS — K219 Gastro-esophageal reflux disease without esophagitis: Secondary | ICD-10-CM

## 2013-11-14 LAB — GLUCOSE, CAPILLARY
Glucose-Capillary: 151 mg/dL — ABNORMAL HIGH (ref 70–99)
Glucose-Capillary: 123 mg/dL — ABNORMAL HIGH (ref 70–99)
Glucose-Capillary: 307 mg/dL — ABNORMAL HIGH (ref 70–99)

## 2013-11-14 LAB — BASIC METABOLIC PANEL
BUN: 56 mg/dL — ABNORMAL HIGH (ref 6–23)
CHLORIDE: 102 meq/L (ref 96–112)
CO2: 26 mEq/L (ref 19–32)
CREATININE: 2.56 mg/dL — AB (ref 0.50–1.10)
Calcium: 8.6 mg/dL (ref 8.4–10.5)
GFR calc Af Amer: 19 mL/min — ABNORMAL LOW (ref >90)
GFR calc non Af Amer: 16 mL/min — ABNORMAL LOW (ref >90)
Glucose, Bld: 140 mg/dL — ABNORMAL HIGH (ref 70–99)
POTASSIUM: 4.7 meq/L (ref 3.7–5.3)
Sodium: 142 mEq/L (ref 137–147)

## 2013-11-14 LAB — PRO B NATRIURETIC PEPTIDE: PRO B NATRI PEPTIDE: 1073 pg/mL — AB (ref 0–450)

## 2013-11-14 MED ORDER — DOXYCYCLINE HYCLATE 100 MG PO TABS
100.0000 mg | ORAL_TABLET | Freq: Two times a day (BID) | ORAL | Status: DC
  Administered 2013-11-14: 100 mg via ORAL
  Filled 2013-11-14 (×3): qty 1

## 2013-11-14 MED ORDER — DOXYCYCLINE HYCLATE 100 MG PO TABS: 100.0000 mg | ORAL_TABLET | Freq: Two times a day (BID) | ORAL | Status: AC

## 2013-11-14 MED ORDER — FUROSEMIDE 20 MG PO TABS: 60.0000 mg | ORAL_TABLET | Freq: Every day | ORAL | Status: DC

## 2013-11-14 MED ORDER — GABAPENTIN 600 MG PO TABS: 600.0000 mg | ORAL_TABLET | Freq: Two times a day (BID) | ORAL | Status: DC

## 2013-11-14 NOTE — Progress Notes (Signed)
Patient abdomen distended and complain of unable to urinate, put pt. On BSC  x2 still unable to urinate. Bladder scan done, scan show 769ml urine, MD notified, order given to insert foley cath and d/c pt. Home, also MD consult urology to see pt. Outpatient. 76F Foley cath insert, 800cc urine return.  V/S stable , d/c instruction given to patient son , family verbalized understanding. IV d/c, patient d/c per order.

## 2013-11-14 NOTE — Discharge Summary (Signed)
Physician Discharge Summary  [name] [id number] DOB: April 04, 1928 DOA: 11/11/2013  PCP: Adella Hare, MD  Admit date: 11/11/2013 Discharge date: 11/14/2013  Time spent: >30 minutes  Recommendations for Outpatient Follow-up:  1. BMET to follow electrolytes and renal function 2. Repeat CXR in 4 weeks to follow resolution of infiltrates 3. Patient will follow with urology for voiding trials  BNP    Component Value Date/Time   PROBNP 1073.0* 11/14/2013 0406   Filed Weights   11/11/13 1102 11/13/13 0352 11/14/13 0700  Weight: 89.449 kg (197 lb 3.2 oz) 84.414 kg (186 lb 1.6 oz) 85.503 kg (188 lb 8 oz)     Discharge Diagnoses:  Active Problems:   IDDM   HYPERTENSION   CAD, NATIVE VESSEL   BACK PAIN, CHRONIC   Chronic kidney disease, stage IV (severe)   CHF (congestive heart failure)   Community acquired pneumonia Urinary retention  Discharge Condition: stable and improved. Will discharge home with home health services for PT and RN.  Diet recommendation: low sodium and low carb diet   History of present illness:  80 y.o. female with PMH of IDDM, HTN, HPL, CAD, CHF presented with progressive SOB, DOE, leg edema, she also reports non productive cough; no fever, chills, no chest pain, had mild nausea no vomiting, no abdominal pain, no focal neuro weakness; ED work up showed, CHF, CAP   Hospital Course:  1. Acute on chronic CHF; CXR: pulmonary edema and vasc congestion.  -good diuresis (patient now 7.3 L neg total); patient breathing good and w/o CP or SOB -will adjust lasix to PO (60mg  daily) for better volume control -BNP in low 1000 range now (decreased from admission) -daily weights -2-D echo with preserved EF, no wall motion abnormalities and grade 2 diastolic dysfunction  -continue low sodium diet   2. Bronchitis/early CAP CXR: could not exclude underlying infiltrate, + leukocytosis  -will discharge on doxycycline to finish antibiotic therapy  -no oxygen  required at discharge -will need CXR in 4 weeks to follow resolution of infiltrates  3. CAD s/p PTCA (LAD-2006); ecg w/o ischemic changes  -troponin neg  -continue BB, statin and ASA  -no CP or SOB at discharge  4. CKD III-IV with hyper K; ECG: no acute changes  -will need repeat BMET in outpatient setting to follow renal function and electrolytes. -potassium WNL now  -Cr essentially stable by GFR   5. DM, HA1C-5.9  -cont insulin regimen lantus and novolog as previously prescribed   6. glaucoma: continue home eye drops.   7. Physical deconditioning: appreciate PT eval and rec's. Will arrange HHPT and HHRN at discharge. Patient with family assistance, care and supervision 24/7  8. Urinary Retention: case discuss with Urology. Will place foley and arrange outpatient visit in 1 week for voiding trials.   Procedures:  See below for x-ray reports  2-D echo: - Left ventricle: The cavity size was normal. Wall thickness was increased increased in a pattern of mild to moderate LVH. Systolic function was normal. The estimated ejection fraction was in the range of 55% to 60%. Wall motion was normal; there were no regional wall motion abnormalities. Features are consistent with a pseudonormal left ventricular filling pattern, with concomitant abnormal relaxation and increased filling pressure (grade 2 diastolic dysfunction). - Aortic valve: There is calcification above the valve and what may be either dense calcium or a possible supravalvular membrane. Trileaflet; moderately thickened, moderately calcified leaflets. Cusp separation was mildly reduced. Valve mobility was mildly restricted. Transvalvular velocity  was increased. There was mild to moderate stenosis. Valve area: 1.24cm^2(VTI). Valve area: 1.33cm^2 (Vmax). - Mitral valve: Mildly calcified annulus. Mild regurgitation. - Left atrium: The atrium was moderately to severely dilated. - Pulmonary arteries: Systolic pressure  was moderately increased. PA peak pressure: 65mm Hg (S).   Consultations:  Urology   Discharge Exam: Filed Vitals:   11/14/13 1007  BP: 130/70  Pulse:   Temp:   Resp:    General: Breathing better, denies CP and is afebrile Cardiovascular: S1 and S2, no rubs or gallops; positive SEM Respiratory: improved air movements, no frank crackles, no rales and no wheezing Abdomen: soft, NT, ND, positive BS  Musculoskeletal: trace edema bilaterally, no cyanosis  Discharge Instructions  Discharge Orders   Future Orders Complete By Expires   Diet - low sodium heart healthy  As directed    Discharge instructions  As directed    Comments:     Follow a low sodium diet (Less than 2 grams daily) Take medications as prescribed Arrange follow up with PCP in 1 week   Face-to-face encounter (required for Medicare/Medicaid patients)  As directed    Comments:     I Eryck Negron certify that this patient is under my care and that I, or a nurse practitioner or physician's assistant working with me, had a face-to-face encounter that meets the physician face-to-face encounter requirements with this patient on 11/14/2013. The encounter with the patient was in whole, or in part for the following medical condition(s) which is the primary reason for home health care (List medical condition): physical deconditioning and heart failure exacerbation   Questions:     The encounter with the patient was in whole, or in part, for the following medical condition, which is the primary reason for home health care:  physical deconditioning and heart failure exacerbation   I certify that, based on my findings, the following services are medically necessary home health services:  Nursing   Physical therapy   My clinical findings support the need for the above services:  Unable to leave home safely without assistance and/or assistive device   Further, I certify that my clinical findings support that this patient is homebound  due to:  Unable to leave home safely without assistance   Reason for Medically Necessary Home Health Services:  Skilled Nursing- Change/Decline in Patient Status   Skilled Nursing- Teaching of Disease Process/Symptom Management   Therapy- Personnel officer, Public librarian   Therapy- Home Adaptation to [place]  As directed    Questions:     To provide the following care/treatments:  PT   RN       Medication List         allopurinol 100 MG tablet  Commonly known as:  ZYLOPRIM  Take 200 mg by mouth daily.     amLODipine 5 MG tablet  Commonly known as:  NORVASC  Take 1 tablet (5 mg total) by mouth daily.     aspirin 81 MG tablet  Take 81 mg by mouth daily.     atropine 1 % ophthalmic solution  Place 1 drop into the left eye 2 (two) times daily.     bimatoprost 0.03 % ophthalmic solution  Commonly known as:  LUMIGAN  Place 1 drop into the left eye daily.     cholecalciferol 1000 UNITS tablet  Commonly known as:  VITAMIN D  Take 1 tablet (1,000 Units total) by mouth daily.  citalopram 20 MG tablet  Commonly known as:  CELEXA  Take 20 mg by mouth daily.     colchicine 0.6 MG tablet  Take 0.6 mg by mouth 2 (two) times daily as needed (for gout flare up).     dorzolamide-timolol 22.3-6.8 MG/ML ophthalmic solution  Commonly known as:  COSOPT  Place 1 drop into both eyes 2 (two) times daily.     doxycycline 100 MG tablet  Commonly known as:  VIBRA-TABS  Take 1 tablet (100 mg total) by mouth every 12 (twelve) hours.     furosemide 20 MG tablet  Commonly known as:  LASIX  Take 3 tablets (60 mg total) by mouth daily.     gabapentin 600 MG tablet  Commonly known as:  NEURONTIN  Take 1 tablet (600 mg total) by mouth 2 (two) times daily.     glucose blood test strip  Commonly known as:  ACCU-CHEK SMARTVIEW  Use as instructed to check blood sugars 2 times per day dx code 250.00     hydrALAZINE 100 MG tablet  Commonly known  as:  APRESOLINE  Take 100 mg by mouth 3 (three) times daily.     insulin glargine 100 UNIT/ML injection  Commonly known as:  LANTUS  Inject 4 Units into the skin at bedtime.     insulin lispro 100 UNIT/ML injection  Commonly known as:  HUMALOG  Inject 10 Units into the skin 3 (three) times daily before meals.     metoprolol tartrate 25 MG tablet  Commonly known as:  LOPRESSOR  Take 3 tablets (75 mg total) by mouth 2 (two) times daily.     nitroGLYCERIN 0.4 MG SL tablet  Commonly known as:  NITROSTAT  Place 0.4 mg under the tongue as needed. For chest pain.     pantoprazole 40 MG tablet  Commonly known as:  PROTONIX  Take 40 mg by mouth daily.     potassium chloride 10 MEQ tablet  Commonly known as:  K-DUR  Take 2 tablets (20 mEq total) by mouth daily.     pravastatin 80 MG tablet  Commonly known as:  PRAVACHOL  Take 1 tablet (80 mg total) by mouth daily.     Vitamin D (Ergocalciferol) 50000 UNITS Caps capsule  Commonly known as:  DRISDOL  Take 50,000 Units by mouth every 7 (seven) days. Take on wednesday       No Known Allergies Follow-up Information   Follow up with [facility]. (for physical therapy and nurse)    Contact information:   [address] [place] [address] [phone]       Follow up with Adella Hare, MD. Schedule an appointment as soon as possible for a visit in 1 week.   Specialty:  Internal Medicine   Contact information:   [address] [place] [address] [phone]       Follow up with Ardis Hughs, MD. (office will call you with appointment details in 1 week)    Specialty:  Urology   Contact information:   [facility]  [place] [address] [phone]        The results of significant diagnostics from this hospitalization (including imaging, microbiology, ancillary and laboratory) are listed below for reference.    Significant Diagnostic  Studies: Dg Chest 2 View  11/13/2013   CLINICAL DATA:  Follow-up CHF.  EXAM: CHEST  2 VIEW  COMPARISON:  DG CHEST 1V PORT dated  11/11/2013; DG CHEST 1V PORT dated 03/01/2013; CT ABD/PELV WO CM dated 02/24/2013; DG CHEST 2 VIEW dated 02/24/2013  FINDINGS: Since the portable examination 2 days ago, interval marked improvement in the interstitial and airspace pulmonary edema, with only mild interstitial edema persisting. No new pulmonary parenchymal abnormalities. Cardiac silhouette moderately enlarged but stable. Moderate size bilateral pleural effusions, best seen on the lateral image in the posterior costophrenic sulci.  IMPRESSION: Interval marked improvement in CHF since the examination 2 days ago, with only mild interstitial edema persisting. Moderate size bilateral pleural effusions. No new abnormalities.   Electronically Signed   By: Evangeline Dakin M.D.   On: 11/13/2013 12:20   Dg Chest Portable 1 View  11/11/2013   CLINICAL DATA:  Shortness of breath.  EXAM: PORTABLE CHEST - 1 VIEW  COMPARISON:  03/01/2013.  FINDINGS: Cardiomegaly with pulmonary venous distention. Progressive bilateral pulmonary alveolar infiltrates are noted. These are most consistent with pulmonary edema. Bilateral pneumonia cannot be excluded. No pleural effusion or pneumothorax.  IMPRESSION: Worsening congestive heart failure and pulmonary edema. Superimposed pneumonia cannot be excluded.   Electronically Signed   By: Marcello Moores  Register   On: 11/11/2013 07:01    Microbiology: Recent Results (from the past 240 hour(s))  URINE CULTURE     Status: None   Collection Time    11/11/13  8:00 AM      Result Value Range Status   Specimen Description URINE, CATHETERIZED   Final   Special Requests NONE   Final   Culture  Setup Time     Final   Value: 11/11/2013 14:14     Performed at [facility]     Final   Value: NO GROWTH     Performed at [facility]   Culture     Final   Value: NO GROWTH      Performed at [facility]   Report Status 11/12/2013 FINAL   Final  CULTURE, BLOOD (ROUTINE X 2)     Status: None   Collection Time    11/11/13  8:10 AM      Result Value Range Status   Specimen Description BLOOD HAND LEFT   Final   Special Requests BOTTLES DRAWN AEROBIC ONLY 5CC   Final   Culture  Setup Time     Final   Value: 11/11/2013 13:16     Performed at [facility]   Culture     Final   Value:        BLOOD CULTURE RECEIVED NO GROWTH TO DATE CULTURE WILL BE HELD FOR 5 DAYS BEFORE ISSUING A FINAL NEGATIVE REPORT     Performed at [facility]   Report Status PENDING   Incomplete  CULTURE, BLOOD (ROUTINE X 2)     Status: None   Collection Time    11/11/13  8:20 AM      Result Value Range Status   Specimen Description BLOOD ARM LEFT   Final   Special Requests BOTTLES DRAWN AEROBIC ONLY 5CC   Final   Culture  Setup Time     Final   Value: 11/11/2013 13:16     Performed at [facility]   Culture     Final   Value:        BLOOD CULTURE RECEIVED NO GROWTH TO DATE CULTURE WILL BE HELD FOR 5 DAYS BEFORE ISSUING A FINAL NEGATIVE REPORT     Performed at [facility]  Report Status PENDING   Incomplete  MRSA PCR SCREENING     Status: None   Collection Time    11/11/13  8:20 PM      Result Value Range Status   MRSA by PCR NEGATIVE  NEGATIVE Final   Comment:            The GeneXpert MRSA Assay (FDA     approved for NASAL specimens     only), is one component of a     comprehensive MRSA colonization     surveillance program. It is not     intended to diagnose MRSA     infection nor to guide or     monitor treatment for     MRSA infections.     Labs: Basic Metabolic Panel:  Recent Labs Lab 11/11/13 0655 11/11/13 1220 11/12/13 0405 11/14/13 0406  NA 144  --  143 142  K 5.8*  --  4.8 4.7  CL 109  --  103 102  CO2 24  --  26 26  GLUCOSE 69*  --  29* 140*  BUN 30*  --  34* 56*  CREATININE 1.74* 1.69* 2.12* 2.56*  CALCIUM 9.6  --   9.3 8.6   Liver Function Tests:  Recent Labs Lab 11/11/13 0655  AST 21  ALT 13  ALKPHOS 118*  BILITOT 0.3  PROT 7.7  ALBUMIN 4.0    Recent Labs Lab 11/11/13 0655  LIPASE 42   No results found for this basename: AMMONIA,  in the last 168 hours CBC:  Recent Labs Lab 11/11/13 0655 11/11/13 1220 11/12/13 0405  WBC 14.1* 13.4* 9.5  NEUTROABS 12.0*  --   --   HGB 9.9* 9.7* 8.5*  HCT 31.2* 30.8* 27.0*  MCV 87.2 86.0 85.2  PLT 181 130* 124*   Cardiac Enzymes:  Recent Labs Lab 11/11/13 1220 11/11/13 2127 11/12/13 0405  TROPONINI <0.30 <0.30 <0.30   BNP: BNP (last 3 results)  Recent Labs  11/11/13 0655 11/14/13 0406  PROBNP 1922.0* 1073.0*   CBG:  Recent Labs Lab 11/13/13 1110 11/13/13 1616 11/13/13 2127 11/14/13 0350 11/14/13 0632  GLUCAP 122* 141* 100* 151* 123*       Signed:  Obrien Huskins  [facility] 11/14/2013, 11:37 AM

## 2013-11-15 ENCOUNTER — Encounter: Payer: Self-pay | Admitting: Nurse Practitioner

## 2013-11-15 ENCOUNTER — Encounter: Payer: Self-pay | Admitting: Internal Medicine

## 2013-11-15 ENCOUNTER — Encounter: Payer: Self-pay | Admitting: Endocrinology

## 2013-11-15 LAB — URINE CULTURE
Colony Count: NO GROWTH
Culture: NO GROWTH

## 2013-11-17 LAB — CULTURE, BLOOD (ROUTINE X 2)
CULTURE: NO GROWTH
Culture: NO GROWTH

## 2013-11-24 ENCOUNTER — Other Ambulatory Visit: Payer: Self-pay | Admitting: Cardiovascular Disease

## 2013-11-29 ENCOUNTER — Other Ambulatory Visit: Payer: Self-pay | Admitting: *Deleted

## 2013-11-29 MED ORDER — INSULIN GLARGINE 100 UNIT/ML ~~LOC~~ SOLN: 4.0000 [IU] | Freq: Every day | SUBCUTANEOUS | Status: DC

## 2013-11-29 MED ORDER — INSULIN LISPRO 100 UNIT/ML ~~LOC~~ SOLN: 10.0000 [IU] | Freq: Three times a day (TID) | SUBCUTANEOUS | Status: DC

## 2013-12-12 ENCOUNTER — Other Ambulatory Visit: Payer: Self-pay | Admitting: Internal Medicine

## 2013-12-13 ENCOUNTER — Other Ambulatory Visit: Payer: Self-pay | Admitting: *Deleted

## 2013-12-13 MED ORDER — COLCHICINE 0.6 MG PO TABS: 0.6000 mg | ORAL_TABLET | Freq: Two times a day (BID) | ORAL | Status: DC | PRN

## 2013-12-23 ENCOUNTER — Other Ambulatory Visit: Payer: Self-pay

## 2013-12-23 MED ORDER — CITALOPRAM HYDROBROMIDE 20 MG PO TABS: 20.0000 mg | ORAL_TABLET | Freq: Every day | ORAL | Status: DC

## 2013-12-23 MED ORDER — ALLOPURINOL 100 MG PO TABS: 200.0000 mg | ORAL_TABLET | Freq: Every day | ORAL | Status: DC

## 2013-12-23 MED ORDER — PANTOPRAZOLE SODIUM 40 MG PO TBEC: 40.0000 mg | DELAYED_RELEASE_TABLET | Freq: Every day | ORAL | Status: DC

## 2013-12-24 ENCOUNTER — Other Ambulatory Visit: Payer: Self-pay

## 2013-12-24 ENCOUNTER — Other Ambulatory Visit: Payer: Self-pay | Admitting: *Deleted

## 2013-12-24 MED ORDER — PRAVASTATIN SODIUM 80 MG PO TABS: 80.0000 mg | ORAL_TABLET | Freq: Every day | ORAL | Status: DC

## 2013-12-24 MED ORDER — METOPROLOL TARTRATE 25 MG PO TABS: 75.0000 mg | ORAL_TABLET | Freq: Two times a day (BID) | ORAL | Status: DC

## 2013-12-24 MED ORDER — HYDRALAZINE HCL 100 MG PO TABS: ORAL_TABLET | ORAL | Status: DC

## 2013-12-24 MED ORDER — GABAPENTIN 600 MG PO TABS: 600.0000 mg | ORAL_TABLET | Freq: Two times a day (BID) | ORAL | Status: DC

## 2013-12-24 MED ORDER — INSULIN LISPRO 100 UNIT/ML ~~LOC~~ SOLN: 10.0000 [IU] | Freq: Three times a day (TID) | SUBCUTANEOUS | Status: DC

## 2013-12-24 MED ORDER — AMLODIPINE BESYLATE 5 MG PO TABS: 5.0000 mg | ORAL_TABLET | Freq: Every day | ORAL | Status: DC

## 2013-12-24 MED ORDER — GLUCOSE BLOOD VI STRP: ORAL_STRIP | Status: DC

## 2014-01-02 IMAGING — XA IR FLUORO GUIDE CV LINE*R*
1 series · 1 of 1 positions shown · non-contrast
Comparison: none

INDICATION: End-stage renal disease, in need of intravenous access
for initiation of hemodialysis

[Series 300: dsa body · 1 of 1 slices shown]
[im 1/1]
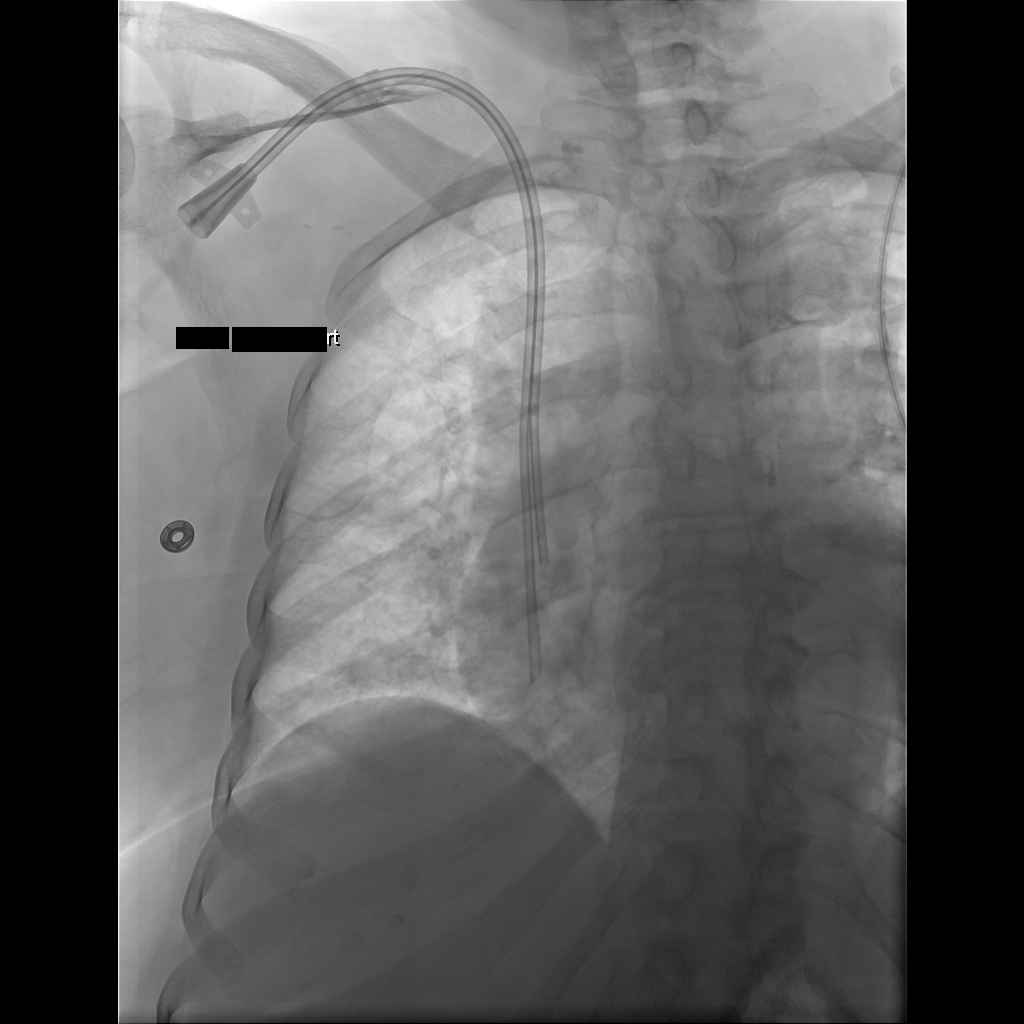

[1 of 1 positions shown; findings below may reference images not displayed]

TUNNELED CENTRAL VENOUS HEMODIALYSIS CATHETER PLACEMENT WITH
ULTRASOUND AND FLUOROSCOPIC GUIDANCE

Medications: Versed 2 mg IV; Fentanyl 50 mcg IV; Ancef 1 gm IV; The
IV antibiotic was given in an appropriate time interval prior to
skin puncture.

Contrast: None

Total Moderate Sedation Time: 20 minutes.

Fluoroscopy Time: 0.7 minutes.

Complications:  None immediate

Findings / Procedure:

Informed written consent was obtained from the patient's daughter
after a discussion of the risks, benefits, and alternatives to
treatment.  Questions regarding the procedure were encouraged and
answered.  The right neck and chest were prepped with chlorhexidine
in a sterile fashion, and a sterile drape was applied covering the
operative field.  Maximum barrier sterile technique with sterile
gowns and gloves were used for the procedure.  A timeout was
performed prior to the initiation of the procedure.

After creating a small venotomy incision, a micropuncture kit was
utilized to access the right internal jugular vein under direct,
real-time ultrasound guidance after the overlying soft tissues were
anesthetized with 1% lidocaine with epinephrine..  Ultrasound image
documentation was performed.  The microwire was kinked to measure
appropriate catheter length.  A stiff glidewire was advanced to the
level of the IVC and the micropuncture sheath was exchanged for a
peel-away sheath.  A Hemosplit tunneled hemodialysis catheter
measuring 19 cm from tip to cuff was tunneled in a retrograde
fashion from the anterior chest wall to the venotomy incision.

The catheter was then placed through the peel-away sheath with tips
ultimately positioned within the superior aspect of the right
atrium.  Final catheter positioning was confirmed and documented
with a spot radiographic image.  The catheter aspirates and flushes
normally.  The catheter was flushed with appropriate volume heparin
dwells.

The catheter exit site was secured with a 0-Prolene retention
suture.  The venotomy incision was closed with an interrupted 4-0
Vicryl, Dermabond and Klever.  Dressings were applied.  The
patient tolerated the procedure well without immediate post
procedural complication.
IMPRESSION: Successful placement of 19 cm tip to cuff tunneled hemodialysis
catheter via the right internal jugular vein with tips terminating
within the superior aspect of the right atrium.  The catheter is
ready for immediate use.

## 2014-02-28 IMAGING — US US ABDOMEN COMPLETE
1 series · 14 of 25 positions shown · non-contrast
Comparison: 02/24/2013

CLINICAL DATA: Pancreatitis.

ABDOMINAL ULTRASOUND COMPLETE

[Series 1: us abdomen complete · 0.28mm/px · 14 of 66 slices shown]
[im 1/66]
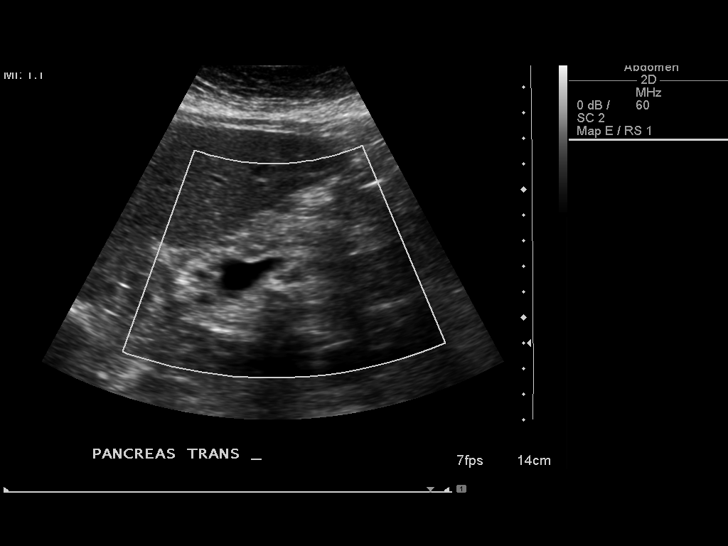
[im 6/66]
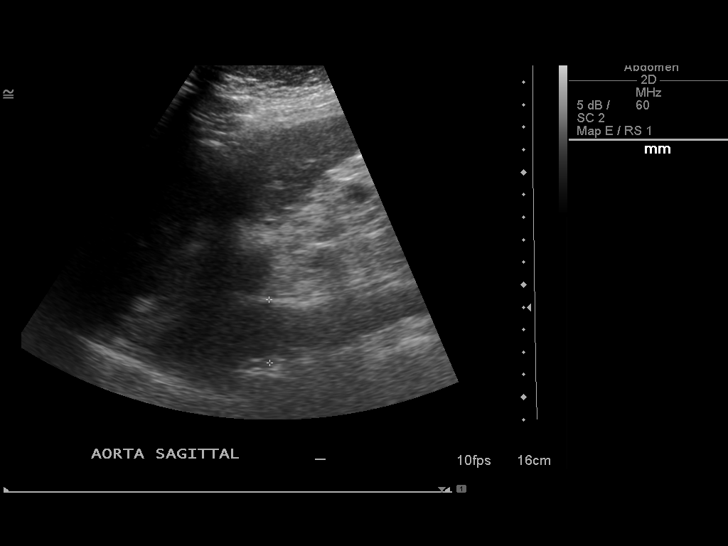
[im 11/66]
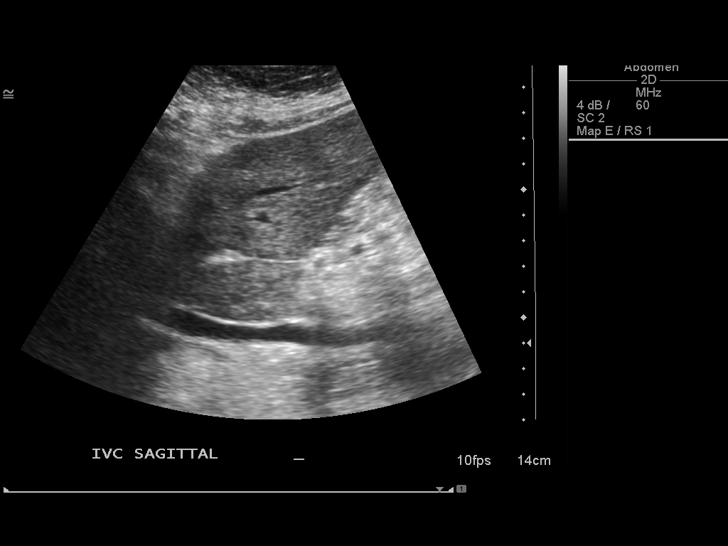
[im 17/66]
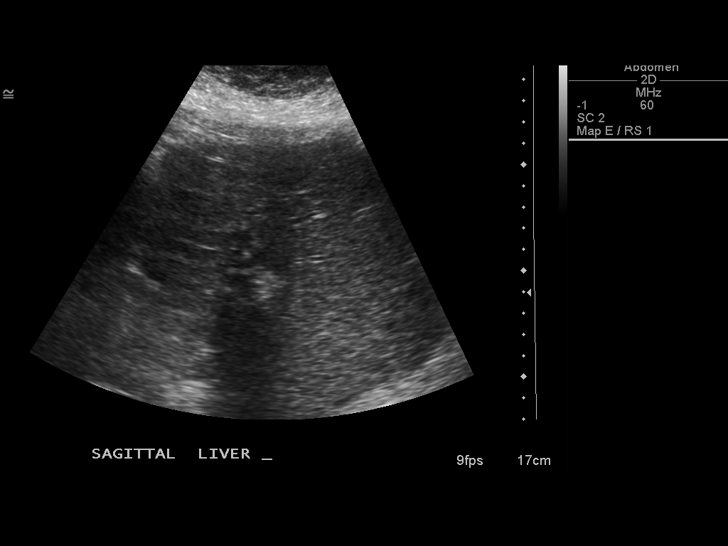
[im 22/66]
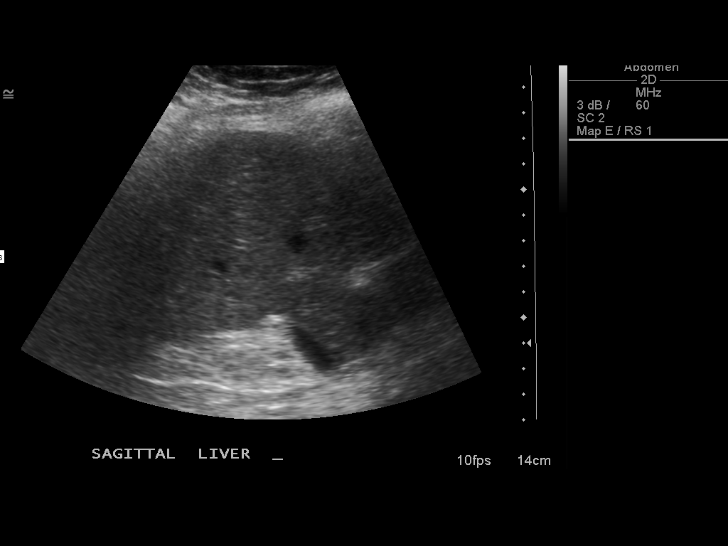
[im 25/66]
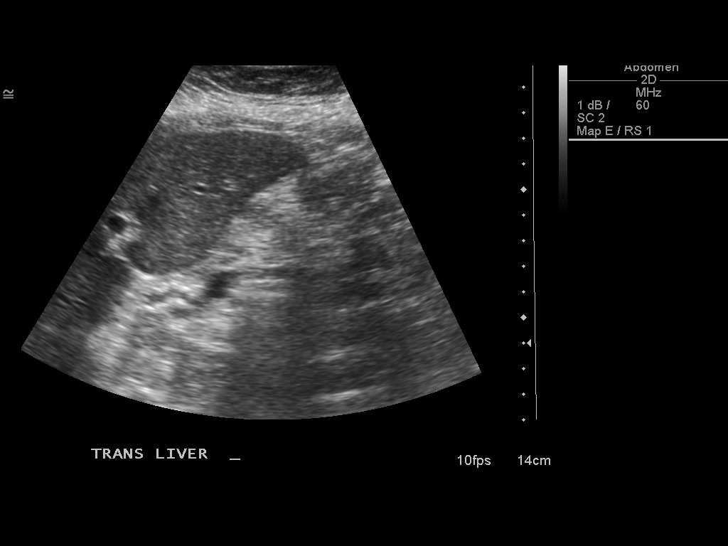
[im 30/66]
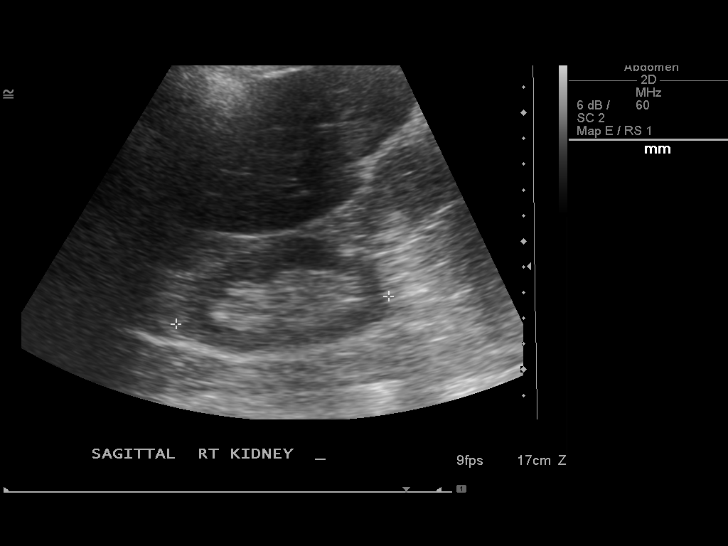
[im 36/66]
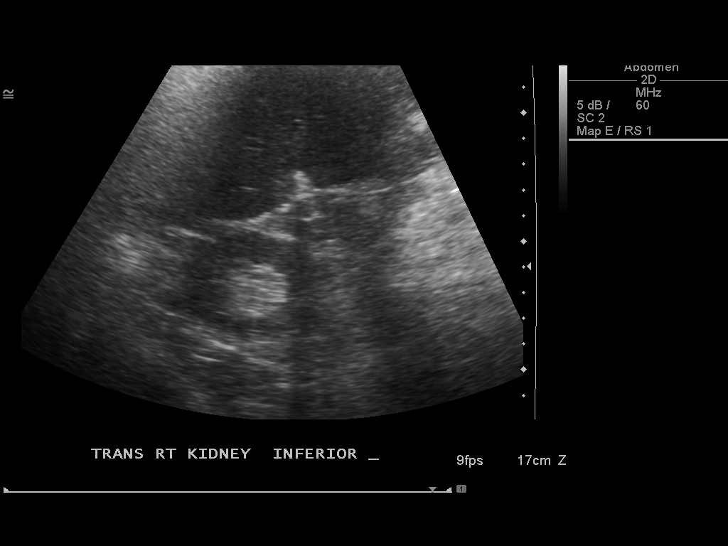
[im 41/66]
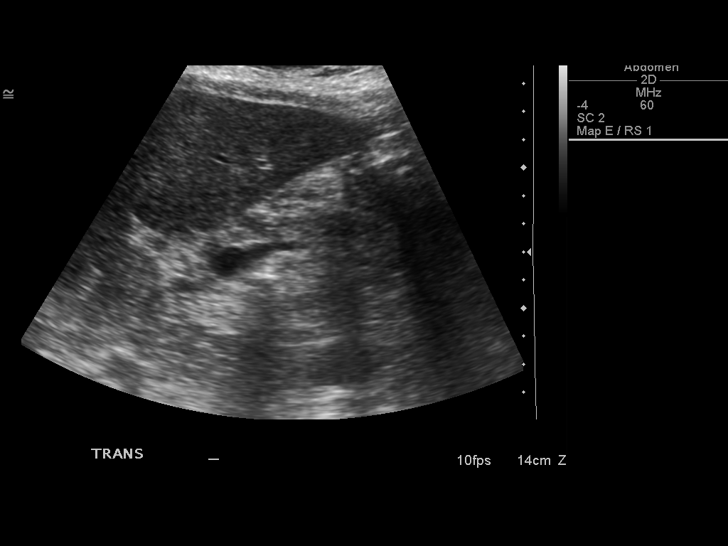
[im 44/66]
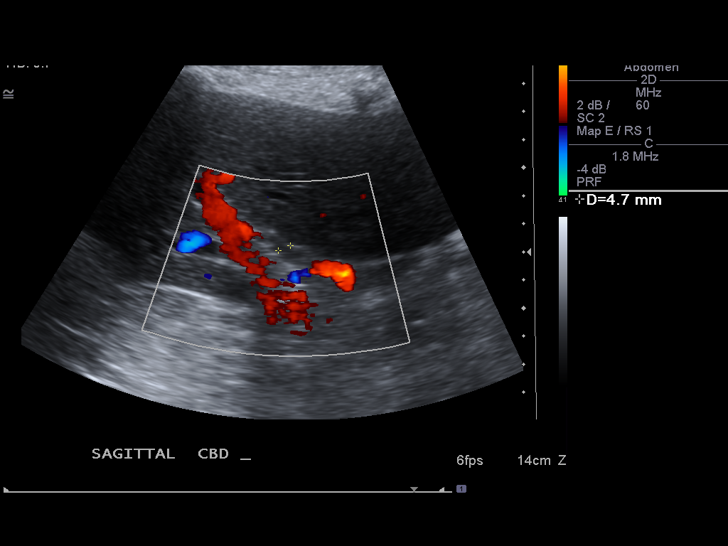
[im 49/66]
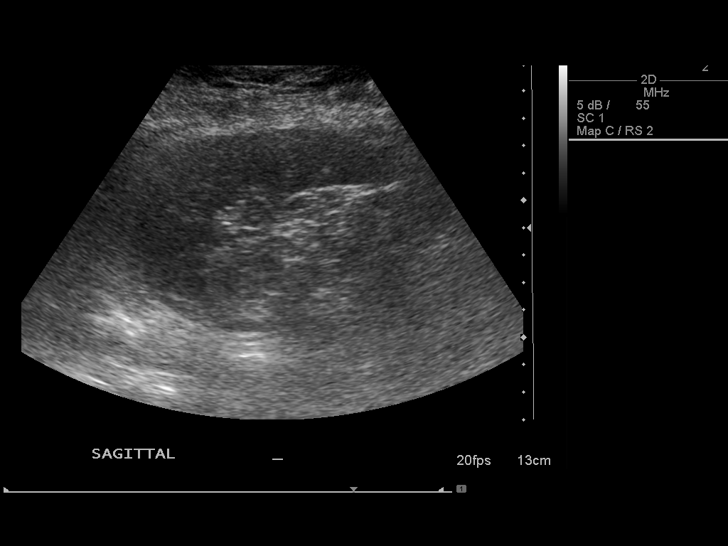
[im 55/66]
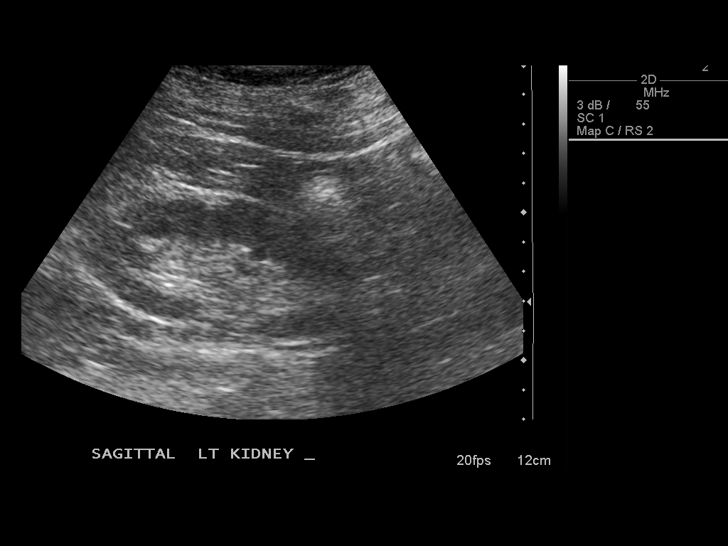
[im 60/66]
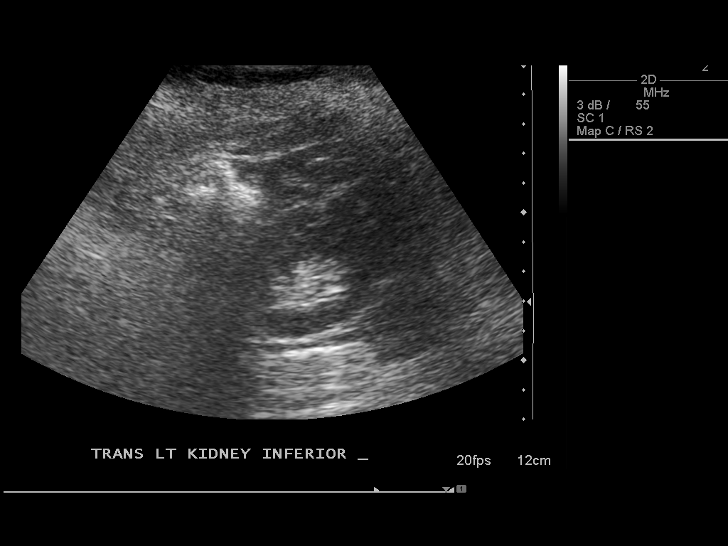
[im 66/66]
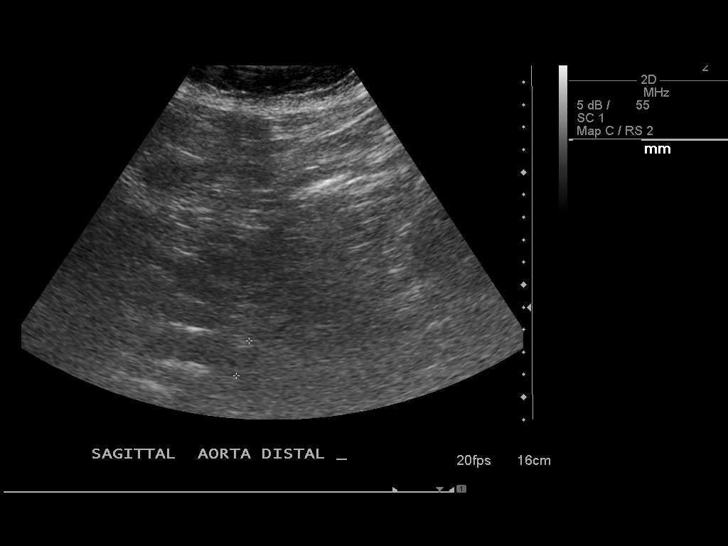

[14 of 25 positions shown; findings below may reference images not displayed]

FINDINGS: Gallbladder:  Previous cholecystectomy

Common Bile Duct:  Within normal limits in caliber.

Liver: No focal mass lesion identified.  Within normal limits in
parenchymal echogenicity.

IVC:  Appears normal.

Pancreas:  Diminished exam detail.No abnormality identified.

Spleen:  Within normal limits in size and echotexture.

Right kidney:  Irregular and lobular contour measuring 9.4 cm.
Normal in parenchymal echogenicity.  No evidence of mass or
hydronephrosis.

Left kidney:  Irregular and lobular contour measuring 9.9 cm.
Normal in parenchymal echogenicity.  No evidence of mass or
hydronephrosis.

Abdominal Aorta:  No aneurysm identified.
IMPRESSION: 1.  No acute findings.
2.  Prior cholecystectomy.

## 2014-04-05 ENCOUNTER — Other Ambulatory Visit: Payer: Self-pay | Admitting: Internal Medicine

## 2014-05-05 ENCOUNTER — Emergency Department (HOSPITAL_COMMUNITY): Payer: Medicare Other

## 2014-05-05 ENCOUNTER — Encounter (HOSPITAL_COMMUNITY): Payer: Self-pay | Admitting: Emergency Medicine

## 2014-05-05 ENCOUNTER — Inpatient Hospital Stay (HOSPITAL_COMMUNITY)
Admission: EM | Admit: 2014-05-05 | Discharge: 2014-05-13 | DRG: 291 | Disposition: A | Discharge status: Home Health | Payer: Medicare Other | Attending: Internal Medicine | Admitting: Internal Medicine

## 2014-05-05 DIAGNOSIS — E1165 Type 2 diabetes mellitus with hyperglycemia: Secondary | ICD-10-CM | POA: Diagnosis present

## 2014-05-05 DIAGNOSIS — E872 Acidosis, unspecified: Secondary | ICD-10-CM | POA: Diagnosis present

## 2014-05-05 DIAGNOSIS — D509 Iron deficiency anemia, unspecified: Secondary | ICD-10-CM | POA: Diagnosis present

## 2014-05-05 DIAGNOSIS — K219 Gastro-esophageal reflux disease without esophagitis: Secondary | ICD-10-CM | POA: Diagnosis present

## 2014-05-05 DIAGNOSIS — I2510 Atherosclerotic heart disease of native coronary artery without angina pectoris: Secondary | ICD-10-CM | POA: Diagnosis present

## 2014-05-05 DIAGNOSIS — N039 Chronic nephritic syndrome with unspecified morphologic changes: Secondary | ICD-10-CM

## 2014-05-05 DIAGNOSIS — I10 Essential (primary) hypertension: Secondary | ICD-10-CM

## 2014-05-05 DIAGNOSIS — E875 Hyperkalemia: Secondary | ICD-10-CM

## 2014-05-05 DIAGNOSIS — N058 Unspecified nephritic syndrome with other morphologic changes: Secondary | ICD-10-CM | POA: Diagnosis present

## 2014-05-05 DIAGNOSIS — F329 Major depressive disorder, single episode, unspecified: Secondary | ICD-10-CM | POA: Diagnosis present

## 2014-05-05 DIAGNOSIS — E785 Hyperlipidemia, unspecified: Secondary | ICD-10-CM | POA: Diagnosis present

## 2014-05-05 DIAGNOSIS — M549 Dorsalgia, unspecified: Secondary | ICD-10-CM

## 2014-05-05 DIAGNOSIS — I5032 Chronic diastolic (congestive) heart failure: Secondary | ICD-10-CM | POA: Diagnosis present

## 2014-05-05 DIAGNOSIS — N179 Acute kidney failure, unspecified: Secondary | ICD-10-CM | POA: Diagnosis not present

## 2014-05-05 DIAGNOSIS — M109 Gout, unspecified: Secondary | ICD-10-CM

## 2014-05-05 DIAGNOSIS — Z794 Long term (current) use of insulin: Secondary | ICD-10-CM

## 2014-05-05 DIAGNOSIS — R001 Bradycardia, unspecified: Secondary | ICD-10-CM

## 2014-05-05 DIAGNOSIS — R339 Retention of urine, unspecified: Secondary | ICD-10-CM | POA: Diagnosis not present

## 2014-05-05 DIAGNOSIS — I2489 Other forms of acute ischemic heart disease: Secondary | ICD-10-CM | POA: Diagnosis present

## 2014-05-05 DIAGNOSIS — I701 Atherosclerosis of renal artery: Secondary | ICD-10-CM

## 2014-05-05 DIAGNOSIS — R0603 Acute respiratory distress: Secondary | ICD-10-CM

## 2014-05-05 DIAGNOSIS — I498 Other specified cardiac arrhythmias: Secondary | ICD-10-CM | POA: Diagnosis present

## 2014-05-05 DIAGNOSIS — I248 Other forms of acute ischemic heart disease: Secondary | ICD-10-CM | POA: Diagnosis present

## 2014-05-05 DIAGNOSIS — E119 Type 2 diabetes mellitus without complications: Secondary | ICD-10-CM

## 2014-05-05 DIAGNOSIS — Z7982 Long term (current) use of aspirin: Secondary | ICD-10-CM | POA: Diagnosis not present

## 2014-05-05 DIAGNOSIS — J9601 Acute respiratory failure with hypoxia: Secondary | ICD-10-CM

## 2014-05-05 DIAGNOSIS — I447 Left bundle-branch block, unspecified: Secondary | ICD-10-CM | POA: Diagnosis present

## 2014-05-05 DIAGNOSIS — E876 Hypokalemia: Secondary | ICD-10-CM | POA: Diagnosis not present

## 2014-05-05 DIAGNOSIS — R5381 Other malaise: Secondary | ICD-10-CM

## 2014-05-05 DIAGNOSIS — E1129 Type 2 diabetes mellitus with other diabetic kidney complication: Secondary | ICD-10-CM | POA: Diagnosis present

## 2014-05-05 DIAGNOSIS — H409 Unspecified glaucoma: Secondary | ICD-10-CM

## 2014-05-05 DIAGNOSIS — J9600 Acute respiratory failure, unspecified whether with hypoxia or hypercapnia: Secondary | ICD-10-CM | POA: Diagnosis present

## 2014-05-05 DIAGNOSIS — Z9089 Acquired absence of other organs: Secondary | ICD-10-CM

## 2014-05-05 DIAGNOSIS — I129 Hypertensive chronic kidney disease with stage 1 through stage 4 chronic kidney disease, or unspecified chronic kidney disease: Secondary | ICD-10-CM | POA: Diagnosis present

## 2014-05-05 DIAGNOSIS — I5033 Acute on chronic diastolic (congestive) heart failure: Principal | ICD-10-CM

## 2014-05-05 DIAGNOSIS — Z833 Family history of diabetes mellitus: Secondary | ICD-10-CM | POA: Diagnosis not present

## 2014-05-05 DIAGNOSIS — R0602 Shortness of breath: Secondary | ICD-10-CM | POA: Diagnosis present

## 2014-05-05 DIAGNOSIS — Z9861 Coronary angioplasty status: Secondary | ICD-10-CM

## 2014-05-05 DIAGNOSIS — F3289 Other specified depressive episodes: Secondary | ICD-10-CM

## 2014-05-05 DIAGNOSIS — Z8249 Family history of ischemic heart disease and other diseases of the circulatory system: Secondary | ICD-10-CM | POA: Diagnosis not present

## 2014-05-05 DIAGNOSIS — D649 Anemia, unspecified: Secondary | ICD-10-CM

## 2014-05-05 DIAGNOSIS — I509 Heart failure, unspecified: Secondary | ICD-10-CM | POA: Diagnosis present

## 2014-05-05 DIAGNOSIS — N184 Chronic kidney disease, stage 4 (severe): Secondary | ICD-10-CM | POA: Diagnosis present

## 2014-05-05 DIAGNOSIS — G44209 Tension-type headache, unspecified, not intractable: Secondary | ICD-10-CM

## 2014-05-05 DIAGNOSIS — IMO0001 Reserved for inherently not codable concepts without codable children: Secondary | ICD-10-CM | POA: Diagnosis present

## 2014-05-05 DIAGNOSIS — E109 Type 1 diabetes mellitus without complications: Secondary | ICD-10-CM

## 2014-05-05 DIAGNOSIS — D631 Anemia in chronic kidney disease: Secondary | ICD-10-CM | POA: Diagnosis present

## 2014-05-05 DIAGNOSIS — J189 Pneumonia, unspecified organism: Secondary | ICD-10-CM

## 2014-05-05 LAB — I-STAT ARTERIAL BLOOD GAS, ED
ACID-BASE DEFICIT: 6 mmol/L — AB (ref 0.0–2.0)
Bicarbonate: 20.4 mEq/L (ref 20.0–24.0)
O2 Saturation: 86 %
PH ART: 7.27 — AB (ref 7.350–7.450)
PO2 ART: 58 mmHg — AB (ref 80.0–100.0)
TCO2: 22 mmol/L (ref 0–100)
pCO2 arterial: 44.5 mmHg (ref 35.0–45.0)

## 2014-05-05 LAB — MRSA PCR SCREENING: MRSA by PCR: NEGATIVE

## 2014-05-05 LAB — URINALYSIS, ROUTINE W REFLEX MICROSCOPIC
Bilirubin Urine: NEGATIVE
Glucose, UA: NEGATIVE mg/dL
Hgb urine dipstick: NEGATIVE
Ketones, ur: NEGATIVE mg/dL
LEUKOCYTES UA: NEGATIVE
NITRITE: NEGATIVE
PH: 5.5 (ref 5.0–8.0)
PROTEIN: NEGATIVE mg/dL
Specific Gravity, Urine: 1.014 (ref 1.005–1.030)
Urobilinogen, UA: 0.2 mg/dL (ref 0.0–1.0)

## 2014-05-05 LAB — CBC WITH DIFFERENTIAL/PLATELET
Basophils Absolute: 0 10*3/uL (ref 0.0–0.1)
Basophils Relative: 0 % (ref 0–1)
Eosinophils Absolute: 0 10*3/uL (ref 0.0–0.7)
Eosinophils Relative: 0 % (ref 0–5)
HEMATOCRIT: 24.5 % — AB (ref 36.0–46.0)
HEMOGLOBIN: 7.2 g/dL — AB (ref 12.0–15.0)
LYMPHS ABS: 1 10*3/uL (ref 0.7–4.0)
LYMPHS PCT: 6 % — AB (ref 12–46)
MCH: 23.5 pg — ABNORMAL LOW (ref 26.0–34.0)
MCHC: 29.4 g/dL — ABNORMAL LOW (ref 30.0–36.0)
MCV: 79.8 fL (ref 78.0–100.0)
MONOS PCT: 5 % (ref 3–12)
Monocytes Absolute: 0.9 10*3/uL (ref 0.1–1.0)
NEUTROS ABS: 16.3 10*3/uL — AB (ref 1.7–7.7)
Neutrophils Relative %: 89 % — ABNORMAL HIGH (ref 43–77)
Platelets: 217 10*3/uL (ref 150–400)
RBC: 3.07 MIL/uL — ABNORMAL LOW (ref 3.87–5.11)
RDW: 19 % — ABNORMAL HIGH (ref 11.5–15.5)
WBC: 18.2 10*3/uL — AB (ref 4.0–10.5)

## 2014-05-05 LAB — RETICULOCYTES
RBC.: 2.82 MIL/uL — AB (ref 3.87–5.11)
RETIC COUNT ABSOLUTE: 56.4 10*3/uL (ref 19.0–186.0)
RETIC CT PCT: 2 % (ref 0.4–3.1)

## 2014-05-05 LAB — COMPREHENSIVE METABOLIC PANEL
ALT: 8 U/L (ref 0–35)
ANION GAP: 16 — AB (ref 5–15)
AST: 15 U/L (ref 0–37)
Albumin: 3.6 g/dL (ref 3.5–5.2)
Alkaline Phosphatase: 138 U/L — ABNORMAL HIGH (ref 39–117)
BILIRUBIN TOTAL: 0.3 mg/dL (ref 0.3–1.2)
BUN: 26 mg/dL — AB (ref 6–23)
CALCIUM: 9 mg/dL (ref 8.4–10.5)
CHLORIDE: 106 meq/L (ref 96–112)
CO2: 19 meq/L (ref 19–32)
Creatinine, Ser: 1.82 mg/dL — ABNORMAL HIGH (ref 0.50–1.10)
GFR, EST AFRICAN AMERICAN: 28 mL/min — AB (ref >90)
GFR, EST NON AFRICAN AMERICAN: 24 mL/min — AB (ref >90)
GLUCOSE: 255 mg/dL — AB (ref 70–99)
Potassium: 5.3 mEq/L (ref 3.7–5.3)
Sodium: 141 mEq/L (ref 137–147)
Total Protein: 7.5 g/dL (ref 6.0–8.3)

## 2014-05-05 LAB — GLUCOSE, CAPILLARY
GLUCOSE-CAPILLARY: 235 mg/dL — AB (ref 70–99)
GLUCOSE-CAPILLARY: 184 mg/dL — AB (ref 70–99)
GLUCOSE-CAPILLARY: 155 mg/dL — AB (ref 70–99)

## 2014-05-05 LAB — PRO B NATRIURETIC PEPTIDE: Pro B Natriuretic peptide (BNP): 2645 pg/mL — ABNORMAL HIGH (ref 0–450)

## 2014-05-05 LAB — I-STAT TROPONIN, ED: TROPONIN I, POC: 0.02 ng/mL (ref 0.00–0.08)

## 2014-05-05 LAB — IRON AND TIBC
Iron: <10 ug/dL — ABNORMAL LOW (ref 42–135)
UIBC: 341 ug/dL (ref 125–400)

## 2014-05-05 LAB — FERRITIN: FERRITIN: 12 ng/mL (ref 10–291)

## 2014-05-05 LAB — TROPONIN I

## 2014-05-05 LAB — FOLATE: Folate: 18.9 ng/mL

## 2014-05-05 LAB — VITAMIN B12: VITAMIN B 12: 603 pg/mL (ref 211–911)

## 2014-05-05 MED ORDER — FUROSEMIDE 10 MG/ML IJ SOLN
40.0000 mg | Freq: Once | INTRAMUSCULAR | Status: AC
  Administered 2014-05-05: 40 mg via INTRAVENOUS
  Filled 2014-05-05: qty 4

## 2014-05-05 MED ORDER — ONDANSETRON HCL 4 MG/2ML IJ SOLN
4.0000 mg | Freq: Four times a day (QID) | INTRAMUSCULAR | Status: DC | PRN
  Filled 2014-05-05: qty 2

## 2014-05-05 MED ORDER — ENOXAPARIN SODIUM 30 MG/0.3ML ~~LOC~~ SOLN
30.0000 mg | SUBCUTANEOUS | Status: DC
  Administered 2014-05-05 – 2014-05-06 (×2): 30 mg via SUBCUTANEOUS
  Filled 2014-05-05 (×3): qty 0.3

## 2014-05-05 MED ORDER — GABAPENTIN 600 MG PO TABS
600.0000 mg | ORAL_TABLET | Freq: Two times a day (BID) | ORAL | Status: DC
  Administered 2014-05-05 – 2014-05-08 (×6): 600 mg via ORAL
  Filled 2014-05-05 (×7): qty 1

## 2014-05-05 MED ORDER — FUROSEMIDE 10 MG/ML IJ SOLN: 80.0000 mg | Freq: Once | INTRAMUSCULAR | Status: DC

## 2014-05-05 MED ORDER — METOPROLOL TARTRATE 50 MG PO TABS
75.0000 mg | ORAL_TABLET | Freq: Two times a day (BID) | ORAL | Status: DC
  Administered 2014-05-05: 75 mg via ORAL
  Filled 2014-05-05: qty 1

## 2014-05-05 MED ORDER — AMLODIPINE BESYLATE 5 MG PO TABS
5.0000 mg | ORAL_TABLET | Freq: Every day | ORAL | Status: DC
  Administered 2014-05-06 – 2014-05-13 (×8): 5 mg via ORAL
  Filled 2014-05-05 (×8): qty 1

## 2014-05-05 MED ORDER — DORZOLAMIDE HCL-TIMOLOL MAL 2-0.5 % OP SOLN
1.0000 [drp] | Freq: Two times a day (BID) | OPHTHALMIC | Status: DC
  Administered 2014-05-05 – 2014-05-13 (×16): 1 [drp] via OPHTHALMIC
  Filled 2014-05-05: qty 10

## 2014-05-05 MED ORDER — NITROGLYCERIN 0.4 MG SL SUBL: 0.4000 mg | SUBLINGUAL_TABLET | SUBLINGUAL | Status: DC | PRN

## 2014-05-05 MED ORDER — HYDRALAZINE HCL 100 MG PO TABS
100.0000 mg | ORAL_TABLET | Freq: Three times a day (TID) | ORAL | Status: DC
  Administered 2014-05-06 – 2014-05-13 (×23): 100 mg via ORAL
  Filled 2014-05-05 (×28): qty 1

## 2014-05-05 MED ORDER — ATROPINE SULFATE 1 % OP SOLN
1.0000 [drp] | Freq: Two times a day (BID) | OPHTHALMIC | Status: DC
  Administered 2014-05-05 – 2014-05-13 (×16): 1 [drp] via OPHTHALMIC
  Filled 2014-05-05: qty 2

## 2014-05-05 MED ORDER — IPRATROPIUM BROMIDE 0.02 % IN SOLN
0.5000 mg | Freq: Once | RESPIRATORY_TRACT | Status: AC
  Administered 2014-05-05: 0.5 mg via RESPIRATORY_TRACT
  Filled 2014-05-05: qty 2.5

## 2014-05-05 MED ORDER — ASPIRIN 300 MG RE SUPP
300.0000 mg | Freq: Once | RECTAL | Status: AC
  Administered 2014-05-05: 300 mg via RECTAL
  Filled 2014-05-05: qty 1

## 2014-05-05 MED ORDER — ALBUTEROL (5 MG/ML) CONTINUOUS INHALATION SOLN
10.0000 mg/h | INHALATION_SOLUTION | RESPIRATORY_TRACT | Status: DC
  Administered 2014-05-05: 10 mg/h via RESPIRATORY_TRACT

## 2014-05-05 MED ORDER — ATORVASTATIN CALCIUM 20 MG PO TABS
20.0000 mg | ORAL_TABLET | Freq: Every day | ORAL | Status: DC
  Administered 2014-05-05 – 2014-05-12 (×8): 20 mg via ORAL
  Filled 2014-05-05 (×9): qty 1

## 2014-05-05 MED ORDER — INSULIN ASPART 100 UNIT/ML ~~LOC~~ SOLN
0.0000 [IU] | Freq: Every day | SUBCUTANEOUS | Status: DC
  Administered 2014-05-06: 0 [IU] via SUBCUTANEOUS
  Administered 2014-05-09: 2 [IU] via SUBCUTANEOUS

## 2014-05-05 MED ORDER — PANTOPRAZOLE SODIUM 40 MG PO TBEC
40.0000 mg | DELAYED_RELEASE_TABLET | Freq: Every day | ORAL | Status: DC
  Administered 2014-05-06 – 2014-05-13 (×7): 40 mg via ORAL
  Filled 2014-05-05 (×7): qty 1

## 2014-05-05 MED ORDER — INSULIN GLARGINE 100 UNIT/ML ~~LOC~~ SOLN
5.0000 [IU] | Freq: Every day | SUBCUTANEOUS | Status: DC
  Administered 2014-05-05 – 2014-05-12 (×8): 5 [IU] via SUBCUTANEOUS
  Filled 2014-05-05 (×9): qty 0.05

## 2014-05-05 MED ORDER — INSULIN ASPART 100 UNIT/ML ~~LOC~~ SOLN
3.0000 [IU] | Freq: Three times a day (TID) | SUBCUTANEOUS | Status: DC
  Administered 2014-05-05 – 2014-05-08 (×4): 3 [IU] via SUBCUTANEOUS
  Administered 2014-05-09 (×2): via SUBCUTANEOUS
  Administered 2014-05-09 – 2014-05-13 (×10): 3 [IU] via SUBCUTANEOUS

## 2014-05-05 MED ORDER — CITALOPRAM HYDROBROMIDE 20 MG PO TABS
20.0000 mg | ORAL_TABLET | Freq: Every day | ORAL | Status: DC
  Administered 2014-05-06 – 2014-05-13 (×8): 20 mg via ORAL
  Filled 2014-05-05 (×8): qty 1

## 2014-05-05 MED ORDER — ACETAMINOPHEN 650 MG RE SUPP: 650.0000 mg | Freq: Four times a day (QID) | RECTAL | Status: DC | PRN

## 2014-05-05 MED ORDER — INSULIN ASPART 100 UNIT/ML ~~LOC~~ SOLN
0.0000 [IU] | Freq: Three times a day (TID) | SUBCUTANEOUS | Status: DC
  Administered 2014-05-05 – 2014-05-06 (×2): 2 [IU] via SUBCUTANEOUS
  Administered 2014-05-06 – 2014-05-07 (×2): 1 [IU] via SUBCUTANEOUS
  Administered 2014-05-07: 2 [IU] via SUBCUTANEOUS
  Administered 2014-05-07: 1 [IU] via SUBCUTANEOUS
  Administered 2014-05-08 (×2): 2 [IU] via SUBCUTANEOUS
  Administered 2014-05-08: 1 [IU] via SUBCUTANEOUS
  Administered 2014-05-09: 2 [IU] via SUBCUTANEOUS
  Administered 2014-05-09: 1 [IU] via SUBCUTANEOUS
  Administered 2014-05-09: 13:00:00 via SUBCUTANEOUS
  Administered 2014-05-10: 3 [IU] via SUBCUTANEOUS
  Administered 2014-05-10 (×2): 1 [IU] via SUBCUTANEOUS
  Administered 2014-05-11 – 2014-05-12 (×3): 2 [IU] via SUBCUTANEOUS
  Administered 2014-05-13: 3 [IU] via SUBCUTANEOUS

## 2014-05-05 MED ORDER — ONDANSETRON HCL 4 MG PO TABS: 4.0000 mg | ORAL_TABLET | Freq: Four times a day (QID) | ORAL | Status: DC | PRN

## 2014-05-05 MED ORDER — SIMVASTATIN 40 MG PO TABS: 40.0000 mg | ORAL_TABLET | Freq: Every day | ORAL | Status: DC

## 2014-05-05 MED ORDER — ALBUTEROL SULFATE (2.5 MG/3ML) 0.083% IN NEBU
2.5000 mg | INHALATION_SOLUTION | RESPIRATORY_TRACT | Status: DC | PRN
  Administered 2014-05-06: 2.5 mg via RESPIRATORY_TRACT
  Filled 2014-05-05: qty 3

## 2014-05-05 MED ORDER — LATANOPROST 0.005 % OP SOLN
1.0000 [drp] | Freq: Every day | OPHTHALMIC | Status: DC
  Administered 2014-05-05 – 2014-05-12 (×8): 1 [drp] via OPHTHALMIC
  Filled 2014-05-05: qty 2.5

## 2014-05-05 MED ORDER — ASPIRIN 81 MG PO CHEW
81.0000 mg | CHEWABLE_TABLET | Freq: Every day | ORAL | Status: DC
  Administered 2014-05-06 – 2014-05-13 (×8): 81 mg via ORAL
  Filled 2014-05-05 (×8): qty 1

## 2014-05-05 MED ORDER — ACETAMINOPHEN 325 MG PO TABS
650.0000 mg | ORAL_TABLET | Freq: Four times a day (QID) | ORAL | Status: DC | PRN
  Administered 2014-05-07 – 2014-05-12 (×6): 650 mg via ORAL
  Filled 2014-05-05 (×6): qty 2

## 2014-05-05 MED ORDER — SODIUM CHLORIDE 0.9 % IJ SOLN
3.0000 mL | Freq: Two times a day (BID) | INTRAMUSCULAR | Status: DC
  Administered 2014-05-05 – 2014-05-13 (×13): 3 mL via INTRAVENOUS

## 2014-05-05 MED ORDER — ALLOPURINOL 100 MG PO TABS
200.0000 mg | ORAL_TABLET | Freq: Every day | ORAL | Status: DC
  Administered 2014-05-06 – 2014-05-13 (×8): 200 mg via ORAL
  Filled 2014-05-05 (×8): qty 2

## 2014-05-05 MED ORDER — WHITE PETROLATUM GEL
Status: AC
  Administered 2014-05-05: 20:00:00
  Filled 2014-05-05: qty 5

## 2014-05-05 MED ORDER — FUROSEMIDE 10 MG/ML IJ SOLN
80.0000 mg | Freq: Two times a day (BID) | INTRAMUSCULAR | Status: DC
  Administered 2014-05-05 – 2014-05-08 (×6): 80 mg via INTRAVENOUS
  Filled 2014-05-05 (×8): qty 8

## 2014-05-05 MED ORDER — ALBUTEROL (5 MG/ML) CONTINUOUS INHALATION SOLN
10.0000 mg/h | INHALATION_SOLUTION | Freq: Once | RESPIRATORY_TRACT | Status: AC
  Administered 2014-05-05: 10 mg/h via RESPIRATORY_TRACT
  Filled 2014-05-05: qty 20

## 2014-05-05 NOTE — ED Provider Notes (Signed)
CSN: [id number]     Arrival date & time 05/05/14  2993 History   None    Chief Complaint  Patient presents with  . Shortness of Breath     (Consider location/radiation/quality/duration/timing/severity/associated sxs/prior Treatment) Patient is a 80 y.o. female presenting with shortness of breath. The history is provided by the EMS personnel and the patient. The history is limited by the condition of the patient.  Shortness of Breath Severity:  Severe Onset quality:  Unable to specify Timing:  Constant Progression:  Worsening Chronicity:  New Relieved by: unknown. Exacerbated by: unknown. Ineffective treatments:  Oxygen, sitting up and inhaler Associated symptoms: chest pain   Risk factors: obesity   Risk factors comment:  CHF   Past Medical History  Diagnosis Date  . Coronary artery disease   . Renal artery stenosis   . Hypertension   . Dyslipidemia   . GERD (gastroesophageal reflux disease)   . Tension headache   . Abdominal pain   . Anemia, unspecified   . Gout   . Back pain, chronic   . History of tonsillectomy   . History of cholecystectomy   . Depression   . IDDM (insulin dependent diabetes mellitus)   . Diabetes mellitus   . CHF (congestive heart failure)    Past Surgical History  Procedure Laterality Date  . Tonsillectomy    . Cholecystectomy  40 years ago   Family History  Problem Relation Age of Onset  . Coronary artery disease Father   . Hypertension Father   . Heart attack Father   . Hyperlipidemia Father   . Diabetes Sister   . Hypertension Sister   . Hyperlipidemia Sister   . Hypertension Brother   . Diabetes Brother   . Hyperlipidemia Brother   . Cancer Neg Hx     Breast and colon   History  Substance Use Topics  . Smoking status: Never Smoker   . Smokeless tobacco: Never Used  . Alcohol Use: No   OB History   Grav Para Term Preterm Abortions TAB SAB Ect Mult Living                 Review of Systems  Unable to perform ROS:  Acuity of condition  Constitutional: Positive for appetite change.  Respiratory: Positive for shortness of breath.   Cardiovascular: Positive for chest pain.  Gastrointestinal: Positive for nausea.  Neurological: Positive for weakness.      Allergies  Review of patient's allergies indicates no known allergies.  Home Medications   Prior to Admission medications   Medication Sig Start Date End Date Taking? Authorizing Provider  allopurinol (ZYLOPRIM) 100 MG tablet Take 2 tablets (200 mg total) by mouth daily. 12/23/13   Neena Rhymes, MD  amLODipine (NORVASC) 5 MG tablet Take 1 tablet (5 mg total) by mouth daily. 12/24/13   Elayne Snare, MD  aspirin 81 MG tablet Take 81 mg by mouth daily.      Historical Provider, MD  atropine 1 % ophthalmic solution Place 1 drop into the left eye 2 (two) times daily.    Historical Provider, MD  bimatoprost (LUMIGAN) 0.03 % ophthalmic solution Place 1 drop into the left eye daily. 05/11/13   Estill Dooms, MD  citalopram (CELEXA) 20 MG tablet Take 1 tablet (20 mg total) by mouth daily. 12/23/13   Neena Rhymes, MD  colchicine 0.6 MG tablet Take 1 tablet (0.6 mg total) by mouth 2 (two) times daily as needed (for gout flare  up). 12/13/13   Elayne Snare, MD  [employer] VITAMIN D3 1000 UNITS capsule TAKE ONE CAPSULE BY MOUTH DAILY 04/05/14   Lew Dawes V, MD  dorzolamide-timolol (COSOPT) 22.3-6.8 MG/ML ophthalmic solution Place 1 drop into both eyes 2 (two) times daily.    Historical Provider, MD  furosemide (LASIX) 20 MG tablet Take 3 tablets (60 mg total) by mouth daily. 11/14/13   Barton Dubois, MD  gabapentin (NEURONTIN) 600 MG tablet Take 1 tablet (600 mg total) by mouth 2 (two) times daily. 12/24/13   Neena Rhymes, MD  glucose blood (ACCU-CHEK SMARTVIEW) test strip Use as instructed to check blood sugars 2 times per day dx code 250.00 12/24/13   Elayne Snare, MD  hydrALAZINE (APRESOLINE) 100 MG tablet TAKE 1 TABLET BY MOUTH 3 TIMES A DAY 12/24/13   Burnell Blanks, MD  insulin glargine (LANTUS) 100 UNIT/ML injection Inject 0.04 mLs (4 Units total) into the skin at bedtime. 11/29/13   Elayne Snare, MD  insulin lispro (HUMALOG) 100 UNIT/ML injection Inject 10 Units into the skin 3 (three) times daily before meals. 12/24/13   Elayne Snare, MD  KLOR-CON M10 10 MEQ tablet TAKE 2 TABLETS (20 MEQ TOTAL) BY MOUTH DAILY.    Neena Rhymes, MD  metoprolol tartrate (LOPRESSOR) 25 MG tablet Take 3 tablets (75 mg total) by mouth 2 (two) times daily. 12/24/13   Burnell Blanks, MD  nitroGLYCERIN (NITROSTAT) 0.4 MG SL tablet Place 0.4 mg under the tongue as needed. For chest pain.     Historical Provider, MD  pantoprazole (PROTONIX) 40 MG tablet Take 1 tablet (40 mg total) by mouth daily. 12/23/13   Neena Rhymes, MD  pravastatin (PRAVACHOL) 80 MG tablet Take 1 tablet (80 mg total) by mouth daily. 12/24/13   Elayne Snare, MD  Vitamin D, Ergocalciferol, (DRISDOL) 50000 UNITS CAPS capsule Take 50,000 Units by mouth every 7 (seven) days. Take on wednesday    Historical Provider, MD   BP 135/54  Pulse 75  Temp(Src) 98 F (36.7 C) (Oral)  Resp 19  Wt 188 lb (85.276 kg)  SpO2 100% Physical Exam  Constitutional: She is oriented to person, place, and time. She appears well-developed and well-nourished. She appears distressed.  HENT:  Head: Normocephalic and atraumatic.  Mouth/Throat: No oropharyngeal exudate.  Eyes: Pupils are equal, round, and reactive to light.  Neck: Normal range of motion. Neck supple.  Cardiovascular: Normal rate, regular rhythm and normal heart sounds.  Exam reveals no gallop and no friction rub.   No murmur heard. Pulmonary/Chest: Effort normal. No respiratory distress. She has decreased breath sounds in the right upper field, the right middle field, the right lower field, the left upper field, the left middle field and the left lower field. She has wheezes in the right upper field, the right middle field, the left upper field and the  left middle field. She has rales in the right lower field and the left lower field.  Abdominal: Soft. Bowel sounds are normal. She exhibits no distension and no mass. There is no tenderness. There is no rebound and no guarding.  Musculoskeletal: Normal range of motion. She exhibits edema (2+ BLLE). She exhibits no tenderness.  Neurological: She is alert and oriented to person, place, and time.  Skin: Skin is warm and dry.  Psychiatric: She has a normal mood and affect.    ED Course  Procedures (including critical care time) Labs Review Labs Reviewed  CBC WITH DIFFERENTIAL - Abnormal; Notable  for the following:    WBC 18.2 (*)    RBC 3.07 (*)    Hemoglobin 7.2 (*)    HCT 24.5 (*)    MCH 23.5 (*)    MCHC 29.4 (*)    RDW 19.0 (*)    Neutrophils Relative % 89 (*)    Neutro Abs 16.3 (*)    Lymphocytes Relative 6 (*)    All other components within normal limits  COMPREHENSIVE METABOLIC PANEL - Abnormal; Notable for the following:    Glucose, Bld 255 (*)    BUN 26 (*)    Creatinine, Ser 1.82 (*)    Alkaline Phosphatase 138 (*)    GFR calc non Af Amer 24 (*)    GFR calc Af Amer 28 (*)    Anion gap 16 (*)    All other components within normal limits  PRO B NATRIURETIC PEPTIDE - Abnormal; Notable for the following:    Pro B Natriuretic peptide (BNP) 2645.0 (*)    All other components within normal limits  RETICULOCYTES - Abnormal; Notable for the following:    RBC. 2.82 (*)    All other components within normal limits  GLUCOSE, CAPILLARY - Abnormal; Notable for the following:    Glucose-Capillary 235 (*)    All other components within normal limits  GLUCOSE, CAPILLARY - Abnormal; Notable for the following:    Glucose-Capillary 184 (*)    All other components within normal limits  I-STAT ARTERIAL BLOOD GAS, ED - Abnormal; Notable for the following:    pH, Arterial 7.270 (*)    pO2, Arterial 58.0 (*)    Acid-base deficit 6.0 (*)    All other components within normal limits  MRSA  PCR SCREENING  URINE CULTURE  URINALYSIS, ROUTINE W REFLEX MICROSCOPIC  TROPONIN I  TROPONIN I  TROPONIN I  VITAMIN B12  FOLATE  IRON AND TIBC  FERRITIN  BASIC METABOLIC PANEL  CBC  OCCULT BLOOD X 1 CARD TO LAB, STOOL  I-STAT TROPOININ, ED    Imaging Review Dg Chest Port 1 View  05/05/2014   CLINICAL DATA:  Generalized weakness and nausea.  EXAM: PORTABLE CHEST - 1 VIEW  COMPARISON:  11/13/2013.  FINDINGS: Support apparatus: Monitoring leads project over the chest.  Cardiomediastinal Silhouette: Partially obscured. Cardiopericardial silhouette does appear enlarged.  Lungs: Diffuse bilateral RIGHT-greater-than-LEFT airspace disease, with a perihilar and basilar predominant distribution. Consolidation at both lung bases, greater on the RIGHT than LEFT. No pneumothorax.  Effusions:  Probable small RIGHT pleural effusion.  Other:  None.  IMPRESSION: Constellation of findings most compatible with moderate CHF. Airspace disease is favored to represent asymmetric/atypical pulmonary edema. Superimposed pneumonia is less unlikely but cannot be excluded.   Electronically Signed   By: Dereck Ligas M.D.   On: 05/05/2014 09:29     EKG Interpretation   Date/Time:  Thursday May 05 2014 08:47:32 EDT Ventricular Rate:  102 PR Interval:  253 QRS Duration: 87 QT Interval:  249 QTC Calculation: 324 R Axis:   -30 Text Interpretation:  Sinus tachycardia Paired ventricular premature  complexes Prolonged PR interval Probable left atrial enlargement Left axis  deviation Probable anterior infarct, age indeterminate Baseline wander in  lead(s) I III aVL Difficult to evaluate due to artifact Confirmed by  DOCHERTY  MD, [place] [phone]) on 05/05/2014 9:04:51 AM     CRITICAL CARE Performed by: Ernestina Patches, E Total critical care time: 30 Critical care time was exclusive of separately billable procedures and treating other patients. Critical  care was necessary to treat or prevent imminent or  life-threatening deterioration. Critical care was time spent personally by me on the following activities: development of treatment plan with patient and/or surrogate as well as nursing, discussions with consultants, evaluation of patient's response to treatment, examination of patient, obtaining history from patient or surrogate, ordering and performing treatments and interventions, ordering and review of laboratory studies, ordering and review of radiographic studies, pulse oximetry and re-evaluation of patient's condition.   MDM   Final diagnoses:  CHF exacerbation  Respiratory distress    Pt is a 80 y.o. female with Pmhx as above who presents with respiratory distress. Per EMS she had generalized weakness and nausea since yesterday, became SOb around 2am last night, then more so en route w/ EMS. They report she was in the 50's on RA, 80's on NRB, but upon arrival is 99% on NRB. She has globally poor air mvmt w/ expiratory wheezing, acceossory muscle use. Will give continuous albuterol, ipratropium while waiting for CXR.  Suspect this may be CHF given PMHx, if pt does not appear to be improving, will start BiPaP  CXR c/w CHF. Will give 40mg  IV lasix.    Pt improving after lasix, is moving more air. I an now able to hear increased wheezing. Will give second continuous albuterol, and additional 40mg  IV lasix. Trop neg, BNP elevated. WBC elevated, though pt denies fever and cough. Will admit to step w/ [facility] for CHF exacerbation and resp distress.    Neta Ehlers, MD 05/05/14 [phone]

## 2014-05-05 NOTE — H&P (Signed)
History and Physical  [name]:[id number] DOB: 19-May-1928 DOA: 05/05/2014  Referring physician: Dr. Ernestina Patches, EDP PCP: Adella Hare, MD -has retired. Patient apparently has a new PCP but does not know the name. Outpatient Specialists:  1. Cardiology: Dr. Lauree Chandler 2. Endocrinology: Dr. Elayne Snare.  Chief Complaint: Difficulty breathing.  HPI: [name] is a 80 y.o. female with history of CAD, status post stenting, hypertension, hyperlipidemia, renal artery stenosis, type II DM with nephropathy, chronic kidney disease stage III-4, gout, chronic anemia, GERD and visually impaired, presented to the ED with the above complaints. She states that she was feeling generally unwell yesterday and had mild dyspnea. She discussed with her son who advised her to come to the hospital but she decided to wait it out. She woke up at approximately 4 AM today with acute dyspnea. She denies chest pain, palpitations, cough, fever, chills. She had some wheezing. She claims compliance with all her medications. She denies compliance with fluid restriction. She has mild leg swelling. She also complained of some nausea yesterday but no vomiting or abdominal pain. As per report, EMS found her with oxygen saturation in the 50s and with respiratory extremis. On arrival to the ED, she was saturating at 99% with nonrebreather mask but had difficulty speaking short sentences due to respiratory distress. She has received prolonged nebulization, 40 mg IV Lasix x2 and she states that she feels much better with improvement in her breathing. In the ED, glucose 255, creatinine 1.82, WBC 18.2, hemoglobin 7.2, proBNP 2645 and chest x-ray compatible with CHF. Hospitalist admission was requested.  Review of Systems: All systems reviewed and apart from history of presenting illness, are negative.  Past Medical History  Diagnosis Date  . Coronary artery disease   . Renal artery stenosis   .  Hypertension   . Dyslipidemia   . GERD (gastroesophageal reflux disease)   . Tension headache   . Abdominal pain   . Anemia, unspecified   . Gout   . Back pain, chronic   . History of tonsillectomy   . History of cholecystectomy   . Depression   . IDDM (insulin dependent diabetes mellitus)   . Diabetes mellitus   . CHF (congestive heart failure)    Past Surgical History  Procedure Laterality Date  . Tonsillectomy    . Cholecystectomy  40 years ago   Social History:  reports that she has never smoked. She has never used smokeless tobacco. She reports that she does not drink alcohol or use illicit drugs. Single. Lives with son. Ambulates with help of a walker. Does not use home oxygen.  No Known Allergies  Family History  Problem Relation Age of Onset  . Coronary artery disease Father   . Hypertension Father   . Heart attack Father   . Hyperlipidemia Father   . Diabetes Sister   . Hypertension Sister   . Hyperlipidemia Sister   . Hypertension Brother   . Diabetes Brother   . Hyperlipidemia Brother   . Cancer Neg Hx     Breast and colon    Prior to Admission medications   Medication Sig Start Date End Date Taking? Authorizing Provider  allopurinol (ZYLOPRIM) 100 MG tablet Take 2 tablets (200 mg total) by mouth daily. 12/23/13   Neena Rhymes, MD  amLODipine (NORVASC) 5 MG tablet Take 1 tablet (5 mg total) by mouth daily. 12/24/13   Elayne Snare, MD  aspirin 81 MG tablet Take 81 mg  by mouth daily.      Historical Provider, MD  atropine 1 % ophthalmic solution Place 1 drop into the left eye 2 (two) times daily.    Historical Provider, MD  bimatoprost (LUMIGAN) 0.03 % ophthalmic solution Place 1 drop into the left eye daily. 05/11/13   Estill Dooms, MD  citalopram (CELEXA) 20 MG tablet Take 1 tablet (20 mg total) by mouth daily. 12/23/13   Neena Rhymes, MD  colchicine 0.6 MG tablet Take 1 tablet (0.6 mg total) by mouth 2 (two) times daily as needed (for gout flare up).  12/13/13   Elayne Snare, MD  [employer] VITAMIN D3 1000 UNITS capsule TAKE ONE CAPSULE BY MOUTH DAILY 04/05/14   Lew Dawes V, MD  dorzolamide-timolol (COSOPT) 22.3-6.8 MG/ML ophthalmic solution Place 1 drop into both eyes 2 (two) times daily.    Historical Provider, MD  furosemide (LASIX) 20 MG tablet Take 3 tablets (60 mg total) by mouth daily. 11/14/13   Barton Dubois, MD  gabapentin (NEURONTIN) 600 MG tablet Take 1 tablet (600 mg total) by mouth 2 (two) times daily. 12/24/13   Neena Rhymes, MD  glucose blood (ACCU-CHEK SMARTVIEW) test strip Use as instructed to check blood sugars 2 times per day dx code 250.00 12/24/13   Elayne Snare, MD  hydrALAZINE (APRESOLINE) 100 MG tablet TAKE 1 TABLET BY MOUTH 3 TIMES A DAY 12/24/13   Burnell Blanks, MD  insulin glargine (LANTUS) 100 UNIT/ML injection Inject 0.04 mLs (4 Units total) into the skin at bedtime. 11/29/13   Elayne Snare, MD  insulin lispro (HUMALOG) 100 UNIT/ML injection Inject 10 Units into the skin 3 (three) times daily before meals. 12/24/13   Elayne Snare, MD  KLOR-CON M10 10 MEQ tablet TAKE 2 TABLETS (20 MEQ TOTAL) BY MOUTH DAILY.    Neena Rhymes, MD  metoprolol tartrate (LOPRESSOR) 25 MG tablet Take 3 tablets (75 mg total) by mouth 2 (two) times daily. 12/24/13   Burnell Blanks, MD  nitroGLYCERIN (NITROSTAT) 0.4 MG SL tablet Place 0.4 mg under the tongue as needed. For chest pain.     Historical Provider, MD  pantoprazole (PROTONIX) 40 MG tablet Take 1 tablet (40 mg total) by mouth daily. 12/23/13   Neena Rhymes, MD  pravastatin (PRAVACHOL) 80 MG tablet Take 1 tablet (80 mg total) by mouth daily. 12/24/13   Elayne Snare, MD  Vitamin D, Ergocalciferol, (DRISDOL) 50000 UNITS CAPS capsule Take 50,000 Units by mouth every 7 (seven) days. Take on wednesday    Historical Provider, MD   Physical Exam: Filed Vitals:   05/05/14 1300 05/05/14 1315 05/05/14 1330 05/05/14 1345  BP: 111/43 125/52 118/60 121/51  Pulse:      Temp:        TempSrc:      Resp: 15 18 21 20   Weight:      SpO2:       temperature maximum: 99.2, pulse 75 per minute, respiration 24 per minute and oxygen saturation 100%.   General exam: Moderately built and nourished elderly African American female patient, lying propped up on the gurney in mild respiratory distress.  Head, eyes and ENT: Nontraumatic and normocephalic. Bilateral corneal opacities right >left. 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 Decreased visual acuity in left eye/finger counting and pupil 3 mm reacting to light. Oral mucosa moist.  Neck: Supple. No carotid bruit or thyromegaly. JVD +  Lymphatics: No lymphadenopathy.  Respiratory system: Diminished breath sounds bilaterally especially in the lower one third  of lung fields posteriorly with bibasal crackles. Scattered few expiratory rhonchi in the upper lung fields. Mild increased work of breathing. Able to speak comfortably in short sentences.  Cardiovascular system: S1 and S2 heard, RRR. No murmurs, gallops, clicks. 1+ pitting bilateral leg edema and JVD +.  Gastrointestinal system: Abdomen is nondistended, soft and nontender. Normal bowel sounds heard. No organomegaly or masses appreciated.  Central nervous system: Alert and oriented. No focal neurological deficits.  Extremities: Symmetric 5 x 5 power. Peripheral pulses symmetrically felt.   Skin: No rashes or acute findings.  Musculoskeletal system: Negative exam.  Psychiatry: Pleasant and cooperative.   Labs on Admission:  Basic Metabolic Panel:  Recent Labs Lab 05/05/14 0919  NA 141  K 5.3  CL 106  CO2 19  GLUCOSE 255*  BUN 26*  CREATININE 1.82*  CALCIUM 9.0   Liver Function Tests:  Recent Labs Lab 05/05/14 0919  AST 15  ALT 8  ALKPHOS 138*  BILITOT 0.3  PROT 7.5  ALBUMIN 3.6   No results found for this basename: LIPASE, AMYLASE,  in the last 168 hours No results found for this basename: AMMONIA,  in the last 168 hours CBC:  Recent Labs Lab  05/05/14 0919  WBC 18.2*  NEUTROABS 16.3*  HGB 7.2*  HCT 24.5*  MCV 79.8  PLT 217   Cardiac Enzymes: No results found for this basename: CKTOTAL, CKMB, CKMBINDEX, TROPONINI,  in the last 168 hours  BNP (last 3 results)  Recent Labs  11/11/13 0655 11/14/13 0406 05/05/14 0919  PROBNP 1922.0* 1073.0* 2645.0*   CBG: No results found for this basename: GLUCAP,  in the last 168 hours  Radiological Exams on Admission: Dg Chest Port 1 View  05/05/2014   CLINICAL DATA:  Generalized weakness and nausea.  EXAM: PORTABLE CHEST - 1 VIEW  COMPARISON:  11/13/2013.  FINDINGS: Support apparatus: Monitoring leads project over the chest.  Cardiomediastinal Silhouette: Partially obscured. Cardiopericardial silhouette does appear enlarged.  Lungs: Diffuse bilateral RIGHT-greater-than-LEFT airspace disease, with a perihilar and basilar predominant distribution. Consolidation at both lung bases, greater on the RIGHT than LEFT. No pneumothorax.  Effusions:  Probable small RIGHT pleural effusion.  Other:  None.  IMPRESSION: Constellation of findings most compatible with moderate CHF. Airspace disease is favored to represent asymmetric/atypical pulmonary edema. Superimposed pneumonia is less unlikely but cannot be excluded.   Electronically Signed   By: Dereck Ligas M.D.   On: 05/05/2014 09:29    EKG: Independently reviewed. Difficult to interpret secondary to poor baseline. Seems to be in sinus rhythm at 75 beats per minute, LAD and without acute changes.  Assessment/Plan Principal Problem:   Acute on chronic diastolic HF (heart failure) Active Problems:   IDDM   DYSLIPIDEMIA   UNSPECIFIED ANEMIA   HYPERTENSION   CAD, NATIVE VESSEL   Chronic kidney disease, stage IV (severe)   Acute respiratory failure with hypoxia   Diastolic CHF, acute on chronic   1. Acute on chronic diastolic CHF: Unclear precipitant.? Secondary to dietary indiscretion, worsening anemia and chronic kidney disease. Admit to  step down unit. Continue IV Lasix 80 mg every 12 hourly. Continue Foley catheter for strict intake output measurement. Monitor closely. 2. Acute respiratory failure with hypoxia: Precipitated by acute CHF. ABG on room air showed pH of 7.27, PCO2 45, PO2 58, bicarbonate 20 and oxygen saturation of 86%. Treat problem #1. Oxygen and when necessary bronchodilators. When necessary BiPAP. Follow chest x-ray in a.m. 3. Anemia: Likely secondary to chronic kidney  disease. Hemoglobin has steadily dropped from 9.7 in January to 7.2. Check anemia panel and FOBT. Transfuse if hemoglobin less than 7 g per DL. May need erythropoietin. 4. Stage III-4 chronic kidney disease: Creatinine seems to be at baseline. Probably not a candidate for renal replacement therapy secondary to advanced age and poor overall general health with multiple comorbidities. Monitor BMP. 5. Uncontrolled type II DM with nephropathy: Placed on low-dose Lantus, NovoLog low-dose at mealtime and sensitive SSI. Monitor closely. 6. Dyslipidemia history: Continue statins 7. Hypertension: Controlled. Continue hydralazine and metoprolol. 8. History of CAD: Asymptomatic of chest pain. Initial troponin negative. Cycle troponin and repeat EKG. Continue aspirin, beta blockers and statins. 9. History of gout: Asymptomatic 10. Leukocytosis: Likely stress response. Although chest x-ray raises concern for pneumonia, no symptoms to suggest same. Follow CBC in a.m.     Code Status: Full  Family Communication: None at bedside.  Disposition Plan: Home when medically stable   Time spent: 1 minutes  Levent Kornegay, MD, FACP, FHM. [facility] Pager [phone]  If 7PM-7AM, please contact night-coverage [email or web address] Password [email or web address] 05/05/2014, 2:21 PM

## 2014-05-05 NOTE — Progress Notes (Signed)
Patients refused po meds stating that she took meds at home all this morning. Verified with son.

## 2014-05-05 NOTE — ED Notes (Signed)
Pt reports general weakness and nausea since yesterday, today had onset of SOB and per PTAR severe Resp Distress. With ems diminished lung sounds in lower other wise clear. Neb in place on arrival. RT and MD at bedside on arrival to ed.

## 2014-05-06 ENCOUNTER — Inpatient Hospital Stay (HOSPITAL_COMMUNITY): Payer: Medicare Other

## 2014-05-06 DIAGNOSIS — R001 Bradycardia, unspecified: Secondary | ICD-10-CM | POA: Diagnosis present

## 2014-05-06 DIAGNOSIS — R55 Syncope and collapse: Secondary | ICD-10-CM

## 2014-05-06 DIAGNOSIS — I509 Heart failure, unspecified: Secondary | ICD-10-CM

## 2014-05-06 DIAGNOSIS — J189 Pneumonia, unspecified organism: Secondary | ICD-10-CM | POA: Diagnosis present

## 2014-05-06 DIAGNOSIS — E875 Hyperkalemia: Secondary | ICD-10-CM | POA: Diagnosis present

## 2014-05-06 DIAGNOSIS — N189 Chronic kidney disease, unspecified: Secondary | ICD-10-CM

## 2014-05-06 LAB — BLOOD GAS, ARTERIAL
ACID-BASE DEFICIT: 3.3 mmol/L — AB (ref 0.0–2.0)
Acid-base deficit: 0.2 mmol/L (ref 0.0–2.0)
Bicarbonate: 23 mEq/L (ref 20.0–24.0)
Bicarbonate: 24.8 mEq/L — ABNORMAL HIGH (ref 20.0–24.0)
Drawn by: 29017
FIO2: 1 %
FIO2: 100 %
O2 Saturation: 82.5 %
O2 Saturation: 69.4 %
PATIENT TEMPERATURE: 98.6
PATIENT TEMPERATURE: 98.6
PCO2 ART: 55.2 mmHg — AB (ref 35.0–45.0)
PH ART: 7.34 — AB (ref 7.350–7.450)
TCO2: 24.7 mmol/L (ref 0–100)
TCO2: 26.2 mmol/L (ref 0–100)
pCO2 arterial: 47.2 mmHg — ABNORMAL HIGH (ref 35.0–45.0)
pH, Arterial: 7.243 — ABNORMAL LOW (ref 7.350–7.450)
pO2, Arterial: 55.2 mmHg — ABNORMAL LOW (ref 80.0–100.0)
pO2, Arterial: 38.9 mmHg — CL (ref 80.0–100.0)

## 2014-05-06 LAB — BASIC METABOLIC PANEL
ANION GAP: 18 — AB (ref 5–15)
ANION GAP: 13 (ref 5–15)
BUN: 31 mg/dL — AB (ref 6–23)
BUN: 31 mg/dL — ABNORMAL HIGH (ref 6–23)
CALCIUM: 8.9 mg/dL (ref 8.4–10.5)
CALCIUM: 8.9 mg/dL (ref 8.4–10.5)
CO2: 20 mEq/L (ref 19–32)
CO2: 25 mEq/L (ref 19–32)
CREATININE: 2.06 mg/dL — AB (ref 0.50–1.10)
CREATININE: 2.14 mg/dL — AB (ref 0.50–1.10)
Chloride: 103 mEq/L (ref 96–112)
Chloride: 104 mEq/L (ref 96–112)
GFR calc non Af Amer: 21 mL/min — ABNORMAL LOW (ref >90)
GFR, EST AFRICAN AMERICAN: 24 mL/min — AB (ref >90)
GFR, EST AFRICAN AMERICAN: 23 mL/min — AB (ref >90)
GFR, EST NON AFRICAN AMERICAN: 20 mL/min — AB (ref >90)
Glucose, Bld: 179 mg/dL — ABNORMAL HIGH (ref 70–99)
Glucose, Bld: 150 mg/dL — ABNORMAL HIGH (ref 70–99)
Potassium: 5.5 mEq/L — ABNORMAL HIGH (ref 3.7–5.3)
Potassium: 5.6 mEq/L — ABNORMAL HIGH (ref 3.7–5.3)
SODIUM: 142 meq/L (ref 137–147)
Sodium: 141 mEq/L (ref 137–147)

## 2014-05-06 LAB — URINE CULTURE
COLONY COUNT: NO GROWTH
Culture: NO GROWTH

## 2014-05-06 LAB — GLUCOSE, CAPILLARY
GLUCOSE-CAPILLARY: 150 mg/dL — AB (ref 70–99)
GLUCOSE-CAPILLARY: 164 mg/dL — AB (ref 70–99)
GLUCOSE-CAPILLARY: 122 mg/dL — AB (ref 70–99)
Glucose-Capillary: 91 mg/dL (ref 70–99)
Glucose-Capillary: 122 mg/dL — ABNORMAL HIGH (ref 70–99)

## 2014-05-06 LAB — CBC
HCT: 23.5 % — ABNORMAL LOW (ref 36.0–46.0)
Hemoglobin: 7.1 g/dL — ABNORMAL LOW (ref 12.0–15.0)
MCH: 23.7 pg — ABNORMAL LOW (ref 26.0–34.0)
MCHC: 30.2 g/dL (ref 30.0–36.0)
MCV: 78.6 fL (ref 78.0–100.0)
RBC: 2.99 MIL/uL — ABNORMAL LOW (ref 3.87–5.11)
RDW: 18.9 % — AB (ref 11.5–15.5)
WBC: 13.6 10*3/uL — ABNORMAL HIGH (ref 4.0–10.5)

## 2014-05-06 LAB — TROPONIN I: Troponin I: 0.42 ng/mL (ref <0.30)

## 2014-05-06 LAB — HIV ANTIBODY (ROUTINE TESTING W REFLEX): HIV 1&2 Ab, 4th Generation: NONREACTIVE

## 2014-05-06 LAB — POTASSIUM: POTASSIUM: 4.6 meq/L (ref 3.7–5.3)

## 2014-05-06 MED ORDER — DEXTROSE 5 % IV SOLN
500.0000 mg | INTRAVENOUS | Status: DC
  Administered 2014-05-06 – 2014-05-09 (×4): 500 mg via INTRAVENOUS
  Filled 2014-05-06 (×4): qty 500

## 2014-05-06 MED ORDER — DEXTROSE 5 % IV SOLN
1.0000 g | INTRAVENOUS | Status: AC
  Administered 2014-05-06 – 2014-05-12 (×7): 1 g via INTRAVENOUS
  Filled 2014-05-06 (×8): qty 10

## 2014-05-06 MED ORDER — SODIUM POLYSTYRENE SULFONATE 15 GM/60ML PO SUSP
15.0000 g | Freq: Once | ORAL | Status: AC
  Administered 2014-05-06: 15 g via ORAL
  Filled 2014-05-06: qty 60

## 2014-05-06 NOTE — Progress Notes (Signed)
Went in to room to assess pt and found her diaphoretic and clammy. CBG 150. Pt suddenly unresponsive and heart rate down to 42. Code blue and rapid response initiated. O2 sats down to 70's. Pt placed on non-re-breather. ABG done. Blood work done. Dr Ronnald Ramp and PA Rogue Bussing at bedside. Pt had EKG changes on monitor and EKG done. Chest Xray done. Will continue to monitor pending results.  Ladell Heads, (RN)

## 2014-05-06 NOTE — Progress Notes (Signed)
Utilization review completed. Margareth Kanner, RN, BSN. 

## 2014-05-06 NOTE — Progress Notes (Signed)
PT Cancellation Note  Patient Details Name: [name] MRN: [id number] DOB: 1927-12-07   Cancelled Treatment:    Reason Eval/Treat Not Completed: Medical issues which prohibited therapy (pt with elevated troponin and await decline in trend prior to eval)   Lanetta Inch Beth 05/06/2014, 1:08 PM Elwyn Reach, [place]

## 2014-05-06 NOTE — Progress Notes (Signed)
Took patient off BIPAP and placed on 5L [place].  Patient is tolerating well at this time: HR 69, Sp02 99%, RR 19.  RT will continue to monitor.

## 2014-05-06 NOTE — Progress Notes (Signed)
Please refer to Dr. Evette Georges note from earlier this am.  Impression: 1. Episode of unresponsiveness last night probably related to respiratory failure with Hypercarbia and bradycardia. Currently in NSR with PACs. Doing better since on BIPAP. Alert and oriented now. May have a component of diastolic CHF. Echo in Jan. 2015 showed normal LV function with Pulmonary HTN. Suspect she has PNA with acute on chronic respiratory failure.  2. Elevated troponin. Will cycle. Suspect related to demand ischemia. Ecg shows new IVCD and LAD related to metabolic derangements.  3. Diastolic CHF acute on chronic. Diuresing well with IV lasix.   4. Fever and leucocytosis.  5. HTN  6. CKD stage 4.

## 2014-05-06 NOTE — Clinical Documentation Improvement (Signed)
This pt was admitted with Acute on Chronic Diastolic CHF. Abnormal K level has been noted along with administration of KAYEXALATE 15 GM/60ML suspension 15 g at 0521 on 7/24. Please note that abnormal findings (laboratory, x-ray, etc.) are not coded and reported unless the physician indicates their clinical significance. If possible, please help by clarifying the diagnostic and/or clinical significance of the abnormal K level (included below). Thank you!   Possible Clinical Conditions?   Hyperkalemia                          Other Condition                 Not clinically significant     Thank You, Hartley Barefoot ,RN Clinical Documentation Specialist:  [facility]

## 2014-05-06 NOTE — Progress Notes (Signed)
Called to room 2C18 for pt in bradycardia hr in 40s, Code Blue called while I was en route room. Upon my arrival pt found resting in bed, HR 70s, BP 118/73, pt lethargic but follows commands. Code team to bedside. No chest compressions or meds given. Po2 60% , pt with minimal respiratory effort, bagged per respiratory therapist for 5 minutes. Po2 rebounded slowly to low 90s. ABG obtained while bagging. 0200 pt more alert and at mental baseline, breathing independently with NRB po2 92-95%, RR 20s, "Im ok " per pt. VSS. Port CXR and EKG obtained and reviewed per [facility] Resident. Kathline Magic NP [facility] paged to bedside. Repeat ABG ordered, labs pending. PCCM consulted per Fredirick Maudlin NP. Pt to remain on 2 C for now. RN advised to notify myself and provider for further concerns.

## 2014-05-06 NOTE — Consult Note (Signed)
Reason for Consult: bradycardia Referring Physician: Harless [name]  [name] is an 80 y.o. female.  HPI: [name] is an 80 yo woman with PMH of CAD with prior stents, hypertension, dyslipidemia, stage III/IV CKD, T2DM with nephropathy, chronic anemia, GERD, visually impaired who presented with dyspnea and was admitted tot he hospitalist service after reeiving IV lasix x2 in the ER (40 mg x2), wbc to 18.2, BNP of 2645 and chest x-ray with increased volume. Cardiology consulted overnight given a code BLUE called because patient had unresponsive with bradycardia with an abg revealing 7.24/55/55. She was seen by Kathline Magic shortly afterwards when the patient had regained consciousness and patient was fairly asymptomatic. She tells me she doesn't remember earlier. She feels/breaths ok now with mask. I spoke with her prinmary RN who tells be shortly before her nonresponsive episode she was talking outloud about her parents and she appeared confused. She was not responsive but just bradycardia down to 40s. No blocks/pauses seen on monitor. Her potassium was 5.5 with repeat 5.6, Cr 2.1 and troponin of 0.42.       Past Medical History  Diagnosis Date  . Coronary artery disease   . Renal artery stenosis   . Hypertension   . Dyslipidemia   . GERD (gastroesophageal reflux disease)   . Tension headache   . Abdominal pain   . Anemia, unspecified   . Gout   . Back pain, chronic   . History of tonsillectomy   . History of cholecystectomy   . Depression   . IDDM (insulin dependent diabetes mellitus)   . Diabetes mellitus   . CHF (congestive heart failure)     Past Surgical History  Procedure Laterality Date  . Tonsillectomy    . Cholecystectomy  40 years ago    Family History  Problem Relation Age of Onset  . Coronary artery disease Father   . Hypertension Father   . Heart attack Father   . Hyperlipidemia Father   . Diabetes Sister   . Hypertension Sister   .  Hyperlipidemia Sister   . Hypertension Brother   . Diabetes Brother   . Hyperlipidemia Brother   . Cancer Neg Hx     Breast and colon    Social History:  reports that she has never smoked. She has never used smokeless tobacco. She reports that she does not drink alcohol or use illicit drugs.  Allergies: No Known Allergies  Medications:  I have reviewed the patient's current medications. Prior to Admission:  Prescriptions prior to admission  Medication Sig Dispense Refill  . allopurinol (ZYLOPRIM) 100 MG tablet Take 2 tablets (200 mg total) by mouth daily.  180 tablet  3  . amLODipine (NORVASC) 5 MG tablet Take 1 tablet (5 mg total) by mouth daily.  90 tablet  1  . aspirin 81 MG tablet Take 81 mg by mouth daily.        Marland Kitchen atropine 1 % ophthalmic solution Place 1 drop into the left eye 2 (two) times daily.      . bimatoprost (LUMIGAN) 0.03 % ophthalmic solution Place 1 drop into the left eye daily.  2.5 mL  3  . citalopram (CELEXA) 20 MG tablet Take 1 tablet (20 mg total) by mouth daily.  30 tablet  11  . colchicine 0.6 MG tablet Take 1 tablet (0.6 mg total) by mouth 2 (two) times daily as needed (for gout flare up).  60 tablet  3  . CVS VITAMIN  D3 1000 UNITS capsule TAKE ONE CAPSULE BY MOUTH DAILY  120 capsule  1  . dorzolamide-timolol (COSOPT) 22.3-6.8 MG/ML ophthalmic solution Place 1 drop into both eyes 2 (two) times daily.      . furosemide (LASIX) 20 MG tablet Take 3 tablets (60 mg total) by mouth daily.  90 tablet  1  . gabapentin (NEURONTIN) 600 MG tablet Take 1 tablet (600 mg total) by mouth 2 (two) times daily.  180 tablet  3  . glucose blood (ACCU-CHEK SMARTVIEW) test strip Use as instructed to check blood sugars 2 times per day dx code 250.00  300 each  1  . hydrALAZINE (APRESOLINE) 100 MG tablet TAKE 1 TABLET BY MOUTH 3 TIMES A DAY  270 tablet  1  . insulin glargine (LANTUS) 100 UNIT/ML injection Inject 0.04 mLs (4 Units total) into the skin at bedtime.  30 mL  1  . insulin  lispro (HUMALOG) 100 UNIT/ML injection Inject 10 Units into the skin 3 (three) times daily before meals.  30 mL  1  . KLOR-CON M10 10 MEQ tablet TAKE 2 TABLETS (20 MEQ TOTAL) BY MOUTH DAILY.  30 tablet  5  . metoprolol tartrate (LOPRESSOR) 25 MG tablet Take 3 tablets (75 mg total) by mouth 2 (two) times daily.  540 tablet  1  . nitroGLYCERIN (NITROSTAT) 0.4 MG SL tablet Place 0.4 mg under the tongue as needed. For chest pain.       . pantoprazole (PROTONIX) 40 MG tablet Take 1 tablet (40 mg total) by mouth daily.  90 tablet  3  . pravastatin (PRAVACHOL) 80 MG tablet Take 1 tablet (80 mg total) by mouth daily.  90 tablet  1  . Vitamin D, Ergocalciferol, (DRISDOL) 50000 UNITS CAPS capsule Take 50,000 Units by mouth every 7 (seven) days. Take on wednesday       Scheduled: . allopurinol  200 mg Oral Daily  . amLODipine  5 mg Oral Daily  . aspirin  81 mg Oral Daily  . atorvastatin  20 mg Oral q1800  . atropine  1 drop Left Eye BID  . citalopram  20 mg Oral Daily  . dorzolamide-timolol  1 drop Both Eyes BID  . enoxaparin (LOVENOX) injection  30 mg Subcutaneous Q24H  . furosemide  80 mg Intravenous BID  . gabapentin  600 mg Oral BID  . hydrALAZINE  100 mg Oral TID  . insulin aspart  0-5 Units Subcutaneous QHS  . insulin aspart  0-9 Units Subcutaneous TID WC  . insulin aspart  3 Units Subcutaneous TID WC  . insulin glargine  5 Units Subcutaneous QHS  . latanoprost  1 drop Left Eye QHS  . pantoprazole  40 mg Oral Daily  . sodium chloride  3 mL Intravenous Q12H   Continuous:   Results for orders placed during the hospital encounter of 05/05/14 (from the past 48 hour(s))  I-STAT ARTERIAL BLOOD GAS, ED     Status: Abnormal   Collection Time    05/05/14  8:55 AM      Result Value Ref Range   pH, Arterial 7.270 (*) 7.350 - 7.450   pCO2 arterial 44.5  35.0 - 45.0 mmHg   pO2, Arterial 58.0 (*) 80.0 - 100.0 mmHg   Bicarbonate 20.4  20.0 - 24.0 mEq/L   TCO2 22  0 - 100 mmol/L   O2 Saturation  86.0     Acid-base deficit 6.0 (*) 0.0 - 2.0 mmol/L   Collection site RADIAL, ALLEN'S TEST  ACCEPTABLE     Drawn by RT     Sample type ARTERIAL    CBC WITH DIFFERENTIAL     Status: Abnormal   Collection Time    05/05/14  9:19 AM      Result Value Ref Range   WBC 18.2 (*) 4.0 - 10.5 K/uL   RBC 3.07 (*) 3.87 - 5.11 MIL/uL   Hemoglobin 7.2 (*) 12.0 - 15.0 g/dL   HCT 24.5 (*) 36.0 - 46.0 %   MCV 79.8  78.0 - 100.0 fL   MCH 23.5 (*) 26.0 - 34.0 pg   MCHC 29.4 (*) 30.0 - 36.0 g/dL   RDW 19.0 (*) 11.5 - 15.5 %   Platelets 217  150 - 400 K/uL   Neutrophils Relative % 89 (*) 43 - 77 %   Neutro Abs 16.3 (*) 1.7 - 7.7 K/uL   Lymphocytes Relative 6 (*) 12 - 46 %   Lymphs Abs 1.0  0.7 - 4.0 K/uL   Monocytes Relative 5  3 - 12 %   Monocytes Absolute 0.9  0.1 - 1.0 K/uL   Eosinophils Relative 0  0 - 5 %   Eosinophils Absolute 0.0  0.0 - 0.7 K/uL   Basophils Relative 0  0 - 1 %   Basophils Absolute 0.0  0.0 - 0.1 K/uL  COMPREHENSIVE METABOLIC PANEL     Status: Abnormal   Collection Time    05/05/14  9:19 AM      Result Value Ref Range   Sodium 141  137 - 147 mEq/L   Potassium 5.3  3.7 - 5.3 mEq/L   Chloride 106  96 - 112 mEq/L   CO2 19  19 - 32 mEq/L   Glucose, Bld 255 (*) 70 - 99 mg/dL   BUN 26 (*) 6 - 23 mg/dL   Creatinine, Ser 1.82 (*) 0.50 - 1.10 mg/dL   Calcium 9.0  8.4 - 10.5 mg/dL   Total Protein 7.5  6.0 - 8.3 g/dL   Albumin 3.6  3.5 - 5.2 g/dL   AST 15  0 - 37 U/L   ALT 8  0 - 35 U/L   Alkaline Phosphatase 138 (*) 39 - 117 U/L   Total Bilirubin 0.3  0.3 - 1.2 mg/dL   GFR calc non Af Amer 24 (*) >90 mL/min   GFR calc Af Amer 28 (*) >90 mL/min   Comment: (NOTE)     The eGFR has been calculated using the CKD EPI equation.     This calculation has not been validated in all clinical situations.     eGFR's persistently <90 mL/min signify possible Chronic Kidney     Disease.   Anion gap 16 (*) 5 - 15  PRO B NATRIURETIC PEPTIDE     Status: Abnormal   Collection Time     05/05/14  9:19 AM      Result Value Ref Range   Pro B Natriuretic peptide (BNP) 2645.0 (*) 0 - 450 pg/mL  I-STAT TROPOININ, ED     Status: None   Collection Time    05/05/14  9:32 AM      Result Value Ref Range   Troponin i, poc 0.02  0.00 - 0.08 ng/mL   Comment 3            Comment: Due to the release kinetics of cTnI,     a negative result within the first hours     of the onset of symptoms does not  rule out     myocardial infarction with certainty.     If myocardial infarction is still suspected,     repeat the test at appropriate intervals.  URINALYSIS, ROUTINE W REFLEX MICROSCOPIC     Status: None   Collection Time    05/05/14 10:18 AM      Result Value Ref Range   Color, Urine YELLOW  YELLOW   APPearance CLEAR  CLEAR   Specific Gravity, Urine 1.014  1.005 - 1.030   pH 5.5  5.0 - 8.0   Glucose, UA NEGATIVE  NEGATIVE mg/dL   Hgb urine dipstick NEGATIVE  NEGATIVE   Bilirubin Urine NEGATIVE  NEGATIVE   Ketones, ur NEGATIVE  NEGATIVE mg/dL   Protein, ur NEGATIVE  NEGATIVE mg/dL   Urobilinogen, UA 0.2  0.0 - 1.0 mg/dL   Nitrite NEGATIVE  NEGATIVE   Leukocytes, UA NEGATIVE  NEGATIVE   Comment: MICROSCOPIC NOT DONE ON URINES WITH NEGATIVE PROTEIN, BLOOD, LEUKOCYTES, NITRITE, OR GLUCOSE <1000 mg/dL.  GLUCOSE, CAPILLARY     Status: Abnormal   Collection Time    05/05/14  2:18 PM      Result Value Ref Range   Glucose-Capillary 235 (*) 70 - 99 mg/dL   Comment 1 Documented in Chart    MRSA PCR SCREENING     Status: None   Collection Time    05/05/14  2:40 PM      Result Value Ref Range   MRSA by PCR NEGATIVE  NEGATIVE   Comment:            The GeneXpert MRSA Assay (FDA     approved for NASAL specimens     only), is one component of a     comprehensive MRSA colonization     surveillance program. It is not     intended to diagnose MRSA     infection nor to guide or     monitor treatment for     MRSA infections.  GLUCOSE, CAPILLARY     Status: Abnormal   Collection Time     05/05/14  5:03 PM      Result Value Ref Range   Glucose-Capillary 184 (*) 70 - 99 mg/dL  TROPONIN I     Status: None   Collection Time    05/05/14  5:05 PM      Result Value Ref Range   Troponin I <0.30  <0.30 ng/mL   Comment:            Due to the release kinetics of cTnI,     a negative result within the first hours     of the onset of symptoms does not rule out     myocardial infarction with certainty.     If myocardial infarction is still suspected,     repeat the test at appropriate intervals.  VITAMIN B12     Status: None   Collection Time    05/05/14  5:05 PM      Result Value Ref Range   Vitamin B-12 603  211 - 911 pg/mL   Comment: Performed at [facility]     Status: None   Collection Time    05/05/14  5:05 PM      Result Value Ref Range   Folate 18.9     Comment: (NOTE)     Reference Ranges            Deficient:       0.4 -  3.3 ng/mL            Indeterminate:   3.4 - 5.4 ng/mL            Normal:              > 5.4 ng/mL     Performed at [facility]  IRON AND TIBC     Status: Abnormal   Collection Time    05/05/14  5:05 PM      Result Value Ref Range   Iron <10 (*) 42 - 135 ug/dL   TIBC Not calculated due to Iron <10.  250 - 470 ug/dL   Saturation Ratios Not calculated due to Iron <10.  20 - 55 %   UIBC 341  125 - 400 ug/dL   Comment: Performed at [facility]  FERRITIN     Status: None   Collection Time    05/05/14  5:05 PM      Result Value Ref Range   Ferritin 12  10 - 291 ng/mL   Comment: Performed at [facility]  RETICULOCYTES     Status: Abnormal   Collection Time    05/05/14  5:05 PM      Result Value Ref Range   Retic Ct Pct 2.0  0.4 - 3.1 %   RBC. 2.82 (*) 3.87 - 5.11 MIL/uL   Retic Count, Manual 56.4  19.0 - 186.0 K/uL  TROPONIN I     Status: None   Collection Time    05/05/14  8:33 PM      Result Value Ref Range   Troponin I <0.30  <0.30 ng/mL   Comment:            Due to the release kinetics  of cTnI,     a negative result within the first hours     of the onset of symptoms does not rule out     myocardial infarction with certainty.     If myocardial infarction is still suspected,     repeat the test at appropriate intervals.  GLUCOSE, CAPILLARY     Status: Abnormal   Collection Time    05/05/14  8:42 PM      Result Value Ref Range   Glucose-Capillary 155 (*) 70 - 99 mg/dL  GLUCOSE, CAPILLARY     Status: Abnormal   Collection Time    05/06/14  1:41 AM      Result Value Ref Range   Glucose-Capillary 150 (*) 70 - 99 mg/dL  BLOOD GAS, ARTERIAL     Status: Abnormal   Collection Time    05/06/14  1:47 AM      Result Value Ref Range   FIO2 1.00     Delivery systems AEROSOL MASK     pH, Arterial 7.243 (*) 7.350 - 7.450   pCO2 arterial 55.2 (*) 35.0 - 45.0 mmHg   pO2, Arterial 55.2 (*) 80.0 - 100.0 mmHg   Bicarbonate 23.0  20.0 - 24.0 mEq/L   TCO2 24.7  0 - 100 mmol/L   Acid-base deficit 3.3 (*) 0.0 - 2.0 mmol/L   O2 Saturation 82.5     Patient temperature 98.6     Collection site RIGHT RADIAL     Drawn by [phone]     Sample type ARTERIAL DRAW     Allens test (pass/fail) PASS  PASS  BASIC METABOLIC PANEL     Status: Abnormal   Collection Time    05/06/14  1:50 AM  Result Value Ref Range   Sodium 141  137 - 147 mEq/L   Potassium 5.5 (*) 3.7 - 5.3 mEq/L   Chloride 103  96 - 112 mEq/L   CO2 20  19 - 32 mEq/L   Glucose, Bld 179 (*) 70 - 99 mg/dL   BUN 31 (*) 6 - 23 mg/dL   Creatinine, Ser 2.58 (*) 0.50 - 1.10 mg/dL   Calcium 8.9  8.4 - 94.8 mg/dL   GFR calc non Af Amer 21 (*) >90 mL/min   GFR calc Af Amer 24 (*) >90 mL/min   Comment: (NOTE)     The eGFR has been calculated using the CKD EPI equation.     This calculation has not been validated in all clinical situations.     eGFR's persistently <90 mL/min signify possible Chronic Kidney     Disease.   Anion gap 18 (*) 5 - 15  CBC     Status: Abnormal   Collection Time    05/06/14  1:50 AM      Result Value  Ref Range   WBC 13.6 (*) 4.0 - 10.5 K/uL   Comment: WHITE COUNT CONFIRMED ON SMEAR   RBC 2.99 (*) 3.87 - 5.11 MIL/uL   Hemoglobin 7.1 (*) 12.0 - 15.0 g/dL   HCT 34.7 (*) 58.3 - 07.4 %   MCV 78.6  78.0 - 100.0 fL   MCH 23.7 (*) 26.0 - 34.0 pg   MCHC 30.2  30.0 - 36.0 g/dL   RDW 60.0 (*) 29.8 - 47.3 %   Platelets PLATELET CLUMPS NOTED ON SMEAR  150 - 400 K/uL  TROPONIN I     Status: Abnormal   Collection Time    05/06/14  2:42 AM      Result Value Ref Range   Troponin I 0.42 (*) <0.30 ng/mL   Comment:            Due to the release kinetics of cTnI,     a negative result within the first hours     of the onset of symptoms does not rule out     myocardial infarction with certainty.     If myocardial infarction is still suspected,     repeat the test at appropriate intervals.     CRITICAL RESULT CALLED TO, READ BACK BY AND VERIFIED WITH:     SAVAGE T,RN 05/06/14 0331 WAYK  BASIC METABOLIC PANEL     Status: Abnormal   Collection Time    05/06/14  2:42 AM      Result Value Ref Range   Sodium 142  137 - 147 mEq/L   Potassium 5.6 (*) 3.7 - 5.3 mEq/L   Chloride 104  96 - 112 mEq/L   CO2 25  19 - 32 mEq/L   Glucose, Bld 150 (*) 70 - 99 mg/dL   BUN 31 (*) 6 - 23 mg/dL   Creatinine, Ser 0.85 (*) 0.50 - 1.10 mg/dL   Calcium 8.9  8.4 - 69.4 mg/dL   GFR calc non Af Amer 20 (*) >90 mL/min   GFR calc Af Amer 23 (*) >90 mL/min   Comment: (NOTE)     The eGFR has been calculated using the CKD EPI equation.     This calculation has not been validated in all clinical situations.     eGFR's persistently <90 mL/min signify possible Chronic Kidney     Disease.   Anion gap 13  5 - 15    Dg Chest  Port 1 View  05/06/2014   CLINICAL DATA:  Respiratory distress.  EXAM: PORTABLE CHEST - 1 VIEW  COMPARISON:  Chest radiograph performed 05/05/2014  FINDINGS: The lungs are well-aerated. There is persistent bilateral airspace opacification, perhaps slightly improved from the prior study. This may reflect  multifocal pneumonia or pulmonary edema. Small bilateral pleural effusions are suspected. No pneumothorax is seen.  The cardiomediastinal silhouette is mildly enlarged. No acute osseous abnormalities are seen. An external pacing pad is noted overlying the right hemithorax.  IMPRESSION: 1. Persistent bilateral airspace opacification, perhaps slightly improved from the prior study. This may reflect multifocal pneumonia or pulmonary edema. Suspect small bilateral pleural effusions. 2. Mild cardiomegaly.   Electronically Signed   By: Garald Balding M.D.   On: 05/06/2014 02:18   Dg Chest Port 1 View  05/05/2014   CLINICAL DATA:  Generalized weakness and nausea.  EXAM: PORTABLE CHEST - 1 VIEW  COMPARISON:  11/13/2013.  FINDINGS: Support apparatus: Monitoring leads project over the chest.  Cardiomediastinal Silhouette: Partially obscured. Cardiopericardial silhouette does appear enlarged.  Lungs: Diffuse bilateral RIGHT-greater-than-LEFT airspace disease, with a perihilar and basilar predominant distribution. Consolidation at both lung bases, greater on the RIGHT than LEFT. No pneumothorax.  Effusions:  Probable small RIGHT pleural effusion.  Other:  None.  IMPRESSION: Constellation of findings most compatible with moderate CHF. Airspace disease is favored to represent asymmetric/atypical pulmonary edema. Superimposed pneumonia is less unlikely but cannot be excluded.   Electronically Signed   By: Dereck Ligas M.D.   On: 05/05/2014 09:29    Review of Systems  Constitutional: Positive for fever, malaise/fatigue and diaphoresis.  HENT: Negative for ear discharge and nosebleeds.   Eyes: Negative for double vision and discharge.  Respiratory: Positive for shortness of breath. Negative for hemoptysis.   Cardiovascular: Negative for chest pain and palpitations.  Gastrointestinal: Negative for nausea and vomiting.  Genitourinary: Negative for hematuria.  Musculoskeletal: Negative for myalgias and neck pain.    Neurological: Negative for sensory change and speech change.  Endo/Heme/Allergies: Negative for polydipsia.  Psychiatric/Behavioral: Negative for depression and hallucinations.   Blood pressure 138/61, pulse 66, temperature 100.5 F (38.1 C), temperature source Axillary, resp. rate 17, height $RemoveBe'5\' 3"'[id number]$  (1.6 m), weight 90.8 kg (200 lb 2.8 oz), SpO2 99.00%. Physical Exam  Nursing note and vitals reviewed. Constitutional: She is oriented to person, place, and time. She appears well-developed and well-nourished. No distress.  HENT:  Head: Normocephalic and atraumatic.  Nose: Nose normal.  Mouth/Throat: Oropharynx is clear and moist. No oropharyngeal exudate.  Eyes: Conjunctivae and EOM are normal. Pupils are equal, round, and reactive to light. No scleral icterus.  Neck: Normal range of motion. Neck supple. JVD present. No tracheal deviation present.  Cardiovascular: Normal rate, regular rhythm and intact distal pulses.  Exam reveals no gallop.   No murmur heard. Respiratory: Effort normal. No respiratory distress. She has rales.  Wearing NRB  GI: Soft. Bowel sounds are normal. She exhibits no distension. There is no tenderness. There is no rebound.  Musculoskeletal: Normal range of motion. She exhibits edema. She exhibits no tenderness.  Neurological: She is alert and oriented to person, place, and time. No cranial nerve deficit.  Skin: Skin is warm and dry. No rash noted. She is not diaphoretic. No erythema.  Psychiatric: She has a normal mood and affect. Her behavior is normal.   ECHo reviewed from 1/15: EF 55-60%, grade II DD ECG reviewed from tonight: LBBB, NSR Labs reviewed above and in medical chart;  abg reviewed above  Assessment/Plan: Ms. Ashtin Melichar is an 80 yo woman with HTN, CAD, known LBBB, HLD who was admitted with dyspnea and heart failure and had unresponsiveness this evening leading to cardiology consultation. 1. Unresponsiveness. Differential is broad but given her abg  and labs she has a metabolic acidosis and respiratory acidosis, delirium, increased volume, brady or tachycarrhythmias. I'd recommend a respiratory pathway as the etiology of this acute scenario that appears to have resolved - CO2 narcosis certainly could have contributed as well as infection. Certainly diuresis will help but may be central respiratory or primary respiratory process as well - consider PNA among other etiologies.  2. Heart failure: agree with continued gentle diuresis. -2.0L now. 80 mg IV lasix bid seems reasonable for now.  3. HTN, CAD: continue aspirin, amlodipine 4. Temperature > 100.3 with leukocytosis and chest x-ray findings: would obtain urine, blood, sputum  5. Chronic kidney disease: renally dose medications, avoid nephrotoxic agents 6. T2DM: defer therapy to primary team 7. Troponin 0.42: very likely demand ischemia in setting of heart failure/volume overload. Reasonable to continue trending.  Oliviana Mcgahee 05/06/2014, 4:52 AM

## 2014-05-06 NOTE — Progress Notes (Signed)
[facility]  [name] YJE:[id number] DOB: [birth year] DOA: 05/05/2014 PCP: Adella Hare, MD  Brief narrative: 80 y.o. female with history of CAD (status post stenting), hypertension, hyperlipidemia, renal artery stenosis, type II DM with nephropathy, chronic kidney disease stage III-4, gout, chronic anemia, GERD and visually impaired. Presented to the ED with SOB. She stated that she had been feeling poorly for about 24 hours which was associated with mild dyspnea.. She denied noncompliance with fluid restriction. She endorsed mild leg swelling. She also complained of some nausea without vomiting or abdominal pain. EMS was called to the home due to progression of symptoms and found her with oxygen saturation in the 50s and with respiratory extremis.   On arrival to the ED, she was saturating at 99% with nonrebreather mask but had difficulty speaking short sentences due to respiratory distress. She had received prolonged nebulization after arrival to the ER, 40 mg IV Lasix x 2 afterwards she endorsed improvement in her breathing. In the ED, glucose 255, creatinine 1.82, WBC 18.2, hemoglobin 7.2, proBNP 2645 and chest x-ray compatible with CHF  HPI/Subjective: States breathing much better.  Appears comfortable.  Denies cp or abdom pain.    Assessment/Plan:    Acute respiratory failure with hypoxia   A) Acute on chronic diastolic HF (heart failure)   B) CAP (community acquired pneumonia) -has responded well to aggressive IV diuresis-over 4L out thus far -today spiking fevers (up to 101) and had leukocytosis at presentation; also initial CXR with bilateral airspace dz edema vs infection-follow up CXR today with persistent opacities and given degree of diuresis and now with clinical findings to support PNA  -Rocephin and Zmax added overnight -had some resp distress overnight that required BiPAP- will attempt to wean today as she is improving      IDDM -currently controlled with Lantus and SSI    Chronic kidney disease, stage IV (severe) -stable and making adequate UOP    Symptomatic bradycardia -overnight was hypoxic and had bradycardia with rates down to 40's and altered menattion - improved after BiPAP applied -likely a primary respiratory event     Hyperkalemia -given Kayexalate -follow -repeat bmet 2 p and in am    UNSPECIFIED ANEMIA -Hgb has dipped but may be dilutional -follow after diuresis and if continues to drop consider add'l work up    HYPERTENSION -controlled    CAD, NATIVE VESSEL    DYSLIPIDEMIA  DVT prophylaxis: Lovenox Code Status: Full Family Communication: No family at bedside Disposition Plan/Expected LOS: Step down  Consultants: None  Procedures: None  Antibiotics: Zithromax 7/23 >>> Rocephin 7/23 >>>  Objective: Blood pressure 144/49, pulse 68, temperature 99.3 F (37.4 C), temperature source Axillary, resp. rate 23, height 5\' 3"  (1.6 m), weight 90.8 kg (200 lb 2.8 oz), SpO2 100.00%.  Intake/Output Summary (Last 24 hours) at 05/06/14 1703 Last data filed at 05/06/14 1630  Gross per 24 hour  Intake    243 ml  Output   3725 ml  Net  -3482 ml   Exam: General: No acute respiratory distress-stable on BiPAP Lungs: Coarse bilaterally with expiratory crackles and rhonchi Cardiovascular: Regular rate and rhythm without murmur gallop or rub normal S1 and S2, no peripheral edema  Abdomen: Nontender, nondistended, soft, bowel sounds positive, no rebound, no ascites, no appreciable mass Musculoskeletal: No significant cyanosis, clubbing of bilateral lower extremities  Scheduled Meds:  Scheduled Meds: . allopurinol  200 mg Oral Daily  . amLODipine  5 mg Oral Daily  . aspirin  81 mg Oral Daily  . atorvastatin  20 mg Oral q1800  . atropine  1 drop Left Eye BID  . azithromycin  500 mg Intravenous Q24H  . cefTRIAXone (ROCEPHIN)  IV  1 g Intravenous Q24H  . citalopram  20 mg Oral Daily  .  dorzolamide-timolol  1 drop Both Eyes BID  . enoxaparin (LOVENOX) injection  30 mg Subcutaneous Q24H  . furosemide  80 mg Intravenous BID  . gabapentin  600 mg Oral BID  . hydrALAZINE  100 mg Oral TID  . insulin aspart  0-5 Units Subcutaneous QHS  . insulin aspart  0-9 Units Subcutaneous TID WC  . insulin aspart  3 Units Subcutaneous TID WC  . insulin glargine  5 Units Subcutaneous QHS  . latanoprost  1 drop Left Eye QHS  . pantoprazole  40 mg Oral Daily  . sodium chloride  3 mL Intravenous Q12H   Data Reviewed: Basic Metabolic Panel:  Recent Labs Lab 05/05/14 0919 05/06/14 0150 05/06/14 0242 05/06/14 1233  NA 141 141 142  --   K 5.3 5.5* 5.6* 4.6  CL 106 103 104  --   CO2 19 20 25   --   GLUCOSE 255* 179* 150*  --   BUN 26* 31* 31*  --   CREATININE 1.82* 2.06* 2.14*  --   CALCIUM 9.0 8.9 8.9  --    Liver Function Tests:  Recent Labs Lab 05/05/14 0919  AST 15  ALT 8  ALKPHOS 138*  BILITOT 0.3  PROT 7.5  ALBUMIN 3.6   No results found for this basename: LIPASE, AMYLASE,  in the last 168 hours No results found for this basename: AMMONIA,  in the last 168 hours  CBC:  Recent Labs Lab 05/05/14 0919 05/06/14 0150  WBC 18.2* 13.6*  NEUTROABS 16.3*  --   HGB 7.2* 7.1*  HCT 24.5* 23.5*  MCV 79.8 78.6  PLT 217 PLATELET CLUMPS NOTED ON SMEAR   Cardiac Enzymes:  Recent Labs Lab 05/05/14 1705 05/05/14 2033 05/06/14 0242  TROPONINI <0.30 <0.30 0.42*   BNP (last 3 results)  Recent Labs  11/11/13 0655 11/14/13 0406 05/05/14 0919  PROBNP 1922.0* 1073.0* 2645.0*   CBG:  Recent Labs Lab 05/05/14 2042 05/06/14 0141 05/06/14 0757 05/06/14 1125 05/06/14 1625  GLUCAP 155* 150* 164* 122* 91    Recent Results (from the past 240 hour(s))  URINE CULTURE     Status: None   Collection Time    05/05/14 10:18 AM      Result Value Ref Range Status   Specimen Description URINE, CATHETERIZED   Final   Special Requests NONE   Final   Culture  Setup Time      Final   Value: 05/05/2014 18:35     Performed at [facility]     Final   Value: NO GROWTH     Performed at [facility]   Culture     Final   Value: NO GROWTH     Performed at [facility]   Report Status 05/06/2014 FINAL   Final  MRSA PCR SCREENING     Status: None   Collection Time    05/05/14  2:40 PM      Result Value Ref Range Status   MRSA by PCR NEGATIVE  NEGATIVE Final   Comment:            The GeneXpert MRSA  Assay (FDA     approved for NASAL specimens     only), is one component of a     comprehensive MRSA colonization     surveillance program. It is not     intended to diagnose MRSA     infection nor to guide or     monitor treatment for     MRSA infections.     Studies:  Recent x-ray studies have been reviewed in detail by the Attending Physician  Time spent :  [place], ANP [facility] Office  [phone] Pager [phone]  **If unable to reach the above provider after paging please contact the [place] @ [phone]  On-Call/Text Page:      [email or web address]      password [email or web address]  If 7PM-7AM, please contact night-coverage [email or web address] Password [email or web address] 05/06/2014, 5:03 PM   LOS: 1 day   I have personally examined this patient and reviewed the entire database. I have reviewed the above note, made any necessary editorial changes, and agree with its content.  Cherene Altes, MD [facility]

## 2014-05-06 NOTE — Progress Notes (Signed)
OT Cancellation Note  Patient Details Name: [name] MRN: [id number] DOB: Feb 26, 1928   Cancelled Treatment:    Reason Eval/Treat Not Completed: Medical issues which prohibited therapy  Darlina Rumpf [place], OTR/L [phone]  05/06/2014, 2:20 PM

## 2014-05-06 NOTE — Progress Notes (Addendum)
Hospitalist progress note. Chief complaint. Bradycardia, syncope. History of present illness. This 80 year old female in hospital with acute on chronic congestive heart failure with associated acute respiratory failure and hypoxemia. She had an admitting blood gas pH 7.27, PCO2 45, PO2 58, and bicarbonate 20 with an O2 saturation of 86%. Patient had been stable overnight until nursing noted the patient to become acutely unresponsive despite aggressive stimulation. Nursing also noted bradycardia heart rate in the 42 range. A CODE BLUE was called and the patient was noted to spontaneously regained consciousness and return to a heart rate greater than 60. No atropine was administered. Patient denied chest pain. An arterial blood gas was obtained after the event pH 7.243, PCO2 55.2, PO2 55.2, bicarbonate 23. CBC found potassium mildly elevated at 5.5. Hemoglobin remained stable at 7.1. I arrived at bedside shortly after the patient regained consciousness. I found her alert and in no distress. Patient denies any chest pain. She denies any worsening dyspnea. Vital signs. Temperature 99.3, pulse 62, respiration 26, blood pressure 147/62. O2 sats are 96% on oxygen. General appearance. Frail appearing elderly female who is alert and in no distress. Cardiac. Rate and rhythm regular. Lungs. Breath sounds are reduced midlung to base bilaterally. O2 sats are stable and patient not exhibiting any signs of dyspnea. Abdomen. Soft with somewhat protuberant. Bowel sounds are present. There is no pain with palpation. Extremities. Mild edema noted. Impression/plan. Problem #1. Bradycardia with syncope. I discussed the case with cardiology. Will repeat a basic metabolic panel to reassess potassium and renal function. We'll also repeat a arterial blood gas and if any worsening would consider adding BiPAP. I've discontinued her beta blocker. I contacted cardiology Doctor Claiborne Billings who has kindly consented to see the patient in  consult. Troponins have been ordered now and then every 6 hours. These results are pending at this time.   Addendum: Repeat metabolic panel results show potassium now mildly elevated 5.6. We'll administer 15 g Kayexalate as a one-time dose and recheck potassium in 6 hours. First troponin resulted mildly elevated 0.42. The patient has experienced no chest pain and does have renal insufficiency. Cardiology consult has now been obtained and felt that patient's respiratory status likely responsible for patient's syncopal episode.  Patient has demonstrated leukocytosis latest 13.6 and now has developed a fever 100.5 axillary. Will start on community-acquired pneumonia antibiotics with Rocephin and azithromycin. Repeat arterial blood gas was not obtainable despite 2 attempts. Given her last blood gas that showed acidosis and hypoxia we'll place on BiPAP. These to continue until rounding physician evaluates later this morning.

## 2014-05-06 NOTE — Code Documentation (Addendum)
CODE BLUE NOTE  Patient Name: [name]   MRN: [id number]   Date of Birth/ Sex: 04-06-1928 , female      Admission Date: 05/05/2014  Attending Provider: Modena Jansky, MD  Primary Diagnosis: Acute on chronic diastolic HF (heart failure)    Indication: Pt was in her usual state of health until this AM, when she was noted to be bradycardia. Code blue was subsequently called. At the time of arrival on scene, ACLS protocol was underway. No chest compressions or medications required. Patient provided bag/mask ventilations for desat on pulse oximetry and altered mental status.     Technical Description:  - CPR performance duration:  0  minutes  - Was defibrillation or cardioversion used? No   - Was external pacer placed? No  - Was patient intubated pre/post CPR? No    Medications Administered: Y = Yes; Blank = No Amiodarone    Atropine    Calcium    Epinephrine    Lidocaine    Magnesium    Norepinephrine    Phenylephrine    Sodium bicarbonate    Vasopressin      Post CPR evaluation:  - Final Status - Was patient successfully resuscitated ? Yes - What is current rhythm? NSR - What is current hemodynamic status? stable   Miscellaneous Information:  - Labs sent, including: CBC, BMP, ABG, Troponin, EKG, CXR  - Primary team notified?  Yes  - Family Notified? No  - Additional notes/ transfer status:  Patient back to baseline mental status after short time with bag/mask. Discussed w/ [facility], likely to transfer patient to unit.    Elberta Leatherwood, MD  05/06/2014, 2:00 AM

## 2014-05-07 LAB — CBC
HCT: 22.6 % — ABNORMAL LOW (ref 36.0–46.0)
HCT: 24 % — ABNORMAL LOW (ref 36.0–46.0)
Hemoglobin: 6.8 g/dL — CL (ref 12.0–15.0)
Hemoglobin: 7.5 g/dL — ABNORMAL LOW (ref 12.0–15.0)
MCH: 24.2 pg — ABNORMAL LOW (ref 26.0–34.0)
MCH: 24.8 pg — ABNORMAL LOW (ref 26.0–34.0)
MCHC: 30.1 g/dL (ref 30.0–36.0)
MCHC: 31.3 g/dL (ref 30.0–36.0)
MCV: 80.4 fL (ref 78.0–100.0)
MCV: 79.2 fL (ref 78.0–100.0)
PLATELETS: 173 10*3/uL (ref 150–400)
PLATELETS: 201 10*3/uL (ref 150–400)
RBC: 2.81 MIL/uL — AB (ref 3.87–5.11)
RBC: 3.03 MIL/uL — ABNORMAL LOW (ref 3.87–5.11)
RDW: 18.8 % — ABNORMAL HIGH (ref 11.5–15.5)
RDW: 17.9 % — AB (ref 11.5–15.5)
WBC: 9.9 10*3/uL (ref 4.0–10.5)
WBC: 9.2 10*3/uL (ref 4.0–10.5)

## 2014-05-07 LAB — GLUCOSE, CAPILLARY
Glucose-Capillary: 157 mg/dL — ABNORMAL HIGH (ref 70–99)
Glucose-Capillary: 140 mg/dL — ABNORMAL HIGH (ref 70–99)
Glucose-Capillary: 132 mg/dL — ABNORMAL HIGH (ref 70–99)
Glucose-Capillary: 157 mg/dL — ABNORMAL HIGH (ref 70–99)

## 2014-05-07 LAB — ABO/RH: ABO/RH(D): B POS

## 2014-05-07 LAB — COMPREHENSIVE METABOLIC PANEL
AST: 13 U/L (ref 0–37)
Albumin: 2.9 g/dL — ABNORMAL LOW (ref 3.5–5.2)
Alkaline Phosphatase: 106 U/L (ref 39–117)
Anion gap: 15 (ref 5–15)
BILIRUBIN TOTAL: 0.3 mg/dL (ref 0.3–1.2)
BUN: 36 mg/dL — ABNORMAL HIGH (ref 6–23)
CHLORIDE: 101 meq/L (ref 96–112)
CO2: 27 meq/L (ref 19–32)
Calcium: 9 mg/dL (ref 8.4–10.5)
Creatinine, Ser: 2.2 mg/dL — ABNORMAL HIGH (ref 0.50–1.10)
GFR calc Af Amer: 22 mL/min — ABNORMAL LOW (ref >90)
GFR, EST NON AFRICAN AMERICAN: 19 mL/min — AB (ref >90)
Glucose, Bld: 125 mg/dL — ABNORMAL HIGH (ref 70–99)
Potassium: 4.1 mEq/L (ref 3.7–5.3)
SODIUM: 143 meq/L (ref 137–147)
Total Protein: 6.6 g/dL (ref 6.0–8.3)

## 2014-05-07 LAB — IRON AND TIBC
Iron: 11 ug/dL — ABNORMAL LOW (ref 42–135)
SATURATION RATIOS: 4 % — AB (ref 20–55)
TIBC: 309 ug/dL (ref 250–470)
UIBC: 298 ug/dL (ref 125–400)

## 2014-05-07 LAB — RETICULOCYTES
RBC.: 2.81 MIL/uL — AB (ref 3.87–5.11)
RETIC CT PCT: 2.1 % (ref 0.4–3.1)
Retic Count, Absolute: 59 10*3/uL (ref 19.0–186.0)

## 2014-05-07 LAB — PREPARE RBC (CROSSMATCH)

## 2014-05-07 NOTE — Progress Notes (Signed)
    Subjective:  Denies CP; dyspnea improving   Objective:  Filed Vitals:   05/07/14 1100 05/07/14 1200 05/07/14 1217 05/07/14 1300  BP: 136/55 127/50 127/50 128/70  Pulse: 73 69 69 74  Temp: 98.4 F (36.9 C) 98.3 F (36.8 C) 98.1 F (36.7 C) 98.2 F (36.8 C)  TempSrc: Oral Oral Oral Oral  Resp: 16 20 16 20   Height:      Weight:      SpO2: 99% 97% 95% 95%    Intake/Output from previous day:  Intake/Output Summary (Last 24 hours) at 05/07/14 1321 Last data filed at 05/07/14 1300  Gross per 24 hour  Intake   1118 ml  Output   2825 ml  Net  -1707 ml    Physical Exam: Physical exam: Well-developed obese in no acute distress.  Skin is warm and dry.  Neck is supple.  Chest with diminished BS bases Cardiovascular exam is regular rate and rhythm.  Abdominal exam nontender or distended. No masses palpated. Extremities show trace edema. neuro grossly intact    Lab Results: Basic Metabolic Panel:  Recent Labs  05/06/14 0242 05/06/14 1233 05/07/14 0319  NA 142  --  143  K 5.6* 4.6 4.1  CL 104  --  101  CO2 25  --  27  GLUCOSE 150*  --  125*  BUN 31*  --  36*  CREATININE 2.14*  --  2.20*  CALCIUM 8.9  --  9.0   CBC:  Recent Labs  05/05/14 0919 05/06/14 0150 05/07/14 0319  WBC 18.2* 13.6* 9.9  NEUTROABS 16.3*  --   --   HGB 7.2* 7.1* 6.8*  HCT 24.5* 23.5* 22.6*  MCV 79.8 78.6 80.4  PLT 217 PLATELET CLUMPS NOTED ON SMEAR 173   Cardiac Enzymes:  Recent Labs  05/05/14 1705 05/05/14 2033 05/06/14 0242  TROPONINI <0.30 <0.30 0.42*     Assessment/Plan:  1 acute on chronic diastolic congestive heart failure-patient's volume status has improved. We'll continue present dose of Lasix today and most likely transitioned to by mouth tomorrow. Follow renal function. 2 pneumonia-continue antibiotics.  3 acute on chronic kidney failure-follow renal function with diuresis. 4 history of coronary artery disease-continue aspirin and statin. 5 recent episode  of bradycardia-felt to be respiratory mediated. Review of telemetry shows no further events. 6 anemia-management per primary care.  Kirk Ruths 05/07/2014, 1:21 PM

## 2014-05-07 NOTE — Evaluation (Signed)
Physical Therapy Evaluation Patient Details Name: [name] MRN: [id number] DOB: 05-30-1928 Today's Date: 05/07/2014   History of Present Illness  Pt is 80 y/o female with history of CAD (status post stenting), hypertension, hyperlipidemia, renal artery stenosis, type II DM with nephropathy, chronic kidney disease stage III-4, gout, chronic anemia, GERD and visually impaired. Presented to the ED with SOB. EMS was called to the home due to progression of symptoms and found her with oxygen saturation in the 50s.     Clinical Impression  Pt admitted with the above. Pt currently with functional limitations due to the deficits listed below (see PT Problem List). Pt with generalized weakness limiting overall function and will benefit from skilled PT to increase their independence and safety with mobility to allow discharge to the venue listed below.      Follow Up Recommendations Home health PT;Supervision/Assistance - 24 hour    Equipment Recommendations       Recommendations for Other Services       Precautions / Restrictions Precautions Precautions: Fall      Mobility  Bed Mobility Overal bed mobility: Needs Assistance Bed Mobility: Supine to Sit;Sit to Supine     Supine to sit: Mod assist Sit to supine: Mod assist   General bed mobility comments: (A) to elevate trunk OOB with cues for proper technique and body position with hand rail .  Transfers Overall transfer level: Needs assistance Equipment used: 1 person hand held assist Transfers: Sit to/from Stand Sit to Stand: Mod assist         General transfer comment: (A) to initiate transfer with max cues for hand placement & LE placement.    Ambulation/Gait Ambulation/Gait assistance: Mod assist Ambulation Distance (Feet): 5 Feet (side steps) Assistive device: 1 person hand held assist Gait Pattern/deviations: Decreased stride length Gait velocity: decreased Gait velocity interpretation: Below normal speed for  age/gender    Stairs            Wheelchair Mobility    Modified Rankin (Stroke Patients Only)       Balance Overall balance assessment: Needs assistance Sitting-balance support: Feet supported;Bilateral upper extremity supported Sitting balance-Leahy Scale: Good     Standing balance support: Bilateral upper extremity supported Standing balance-Leahy Scale: Fair Standing balance comment: Pt with posterior lean in standing and needs assistance to maintain balance.                              Pertinent Vitals/Pain No c/o pain at this time.    Home Living Family/patient expects to be discharged to:: Private residence Living Arrangements: Children Available Help at Discharge: Family;Available 24 hours/day Type of Home: House Home Access: Level entry     Home Layout: One level Home Equipment: Walker - 2 wheels;Shower seat      Prior Function Level of Independence: Independent with assistive device(s)         Comments: pt and family reports pt ambulates with RW when needed; in house at times may not use RW; pt is independent with ADLs; son is responsible for house upkeep and cooking     Hand Dominance   Dominant Hand: Right    Extremity/Trunk Assessment               Lower Extremity Assessment: Generalized weakness         Communication   Communication: No difficulties  Cognition Arousal/Alertness: Awake/alert Behavior During Therapy: WFL for tasks assessed/performed Overall  Cognitive Status: Within Functional Limits for tasks assessed                      General Comments      Exercises        Assessment/Plan    PT Assessment Patient needs continued PT services  PT Diagnosis Difficulty walking;Generalized weakness;Acute pain   PT Problem List Decreased strength;Decreased activity tolerance;Decreased balance;Decreased mobility;Decreased cognition;Decreased knowledge of use of DME;Pain  PT Treatment Interventions  DME instruction;Gait training;Stair training;Functional mobility training;Therapeutic activities;Therapeutic exercise;Balance training;Cognitive remediation;Patient/family education   PT Goals (Current goals can be found in the Care Plan section) Acute Rehab PT Goals Patient Stated Goal: Pt wants to get back to walking and return home PT Goal Formulation: With patient/family Time For Goal Achievement: 05/14/14 Potential to Achieve Goals: Good    Frequency Min 3X/week   Barriers to discharge        Co-evaluation               End of Session Equipment Utilized During Treatment: Oxygen Activity Tolerance: Patient limited by fatigue Patient left: in bed;with call bell/phone within reach;with family/visitor present Nurse Communication: Mobility status         Time: 1127-1150 PT Time Calculation (min): 23 min   Charges:   PT Evaluation $Initial PT Evaluation Tier I: 1 Procedure PT Treatments $Therapeutic Activity: 8-22 mins   PT G Codes:          Titiana Severa 12-May-2014, 1:05 PM  Antoine Poche, [place] DPT [phone]

## 2014-05-07 NOTE — Progress Notes (Signed)
[facility]  [name] SFK:[id number] DOB: 09-Aug-1928 DOA: 05/05/2014 PCP: Adella Hare, MD  Brief narrative: 80 y.o. female with history of CAD (status post stenting), hypertension, hyperlipidemia, renal artery stenosis, type II DM with nephropathy, chronic kidney disease stage III-IV, gout, chronic anemia, GERD and visually impaired who presented to the ED with SOB. She stated that she had been feeling poorly for about 24 hours which was associated with mild dyspnea.. She denied noncompliance with fluid restriction. She endorsed mild leg swelling. She also complained of some nausea without vomiting or abdominal pain. EMS was called to the home due to progression of symptoms and found her with oxygen saturation in the 50s in respiratory extremis.   On arrival to the ED, she was saturating at 99% with nonrebreather mask but had difficulty speaking short sentences due to respiratory distress. She received prolonged nebulization after arrival to the ER, with 40 mg IV Lasix x 2 and endorsed improvement in her breathing. In the ED, glucose 255, creatinine 1.82, WBC 18.2, hemoglobin 7.2, proBNP 2645 and chest x-ray compatible with CHF  HPI/Subjective: Pt is resting comfortably today with no new complaints.    Assessment/Plan:  Acute hypoxic respiratory failure with hypoxia:    A) Acute on chronic diastolic HF  -continue diuresis     B) Community acquired pneumonia  -cont empiric abx - O2 requirements decreasing - now afebrile and WBC declining   DM -currently controlled with Lantus and SSI  Chronic kidney disease, stage IV  -stable   Symptomatic bradycardia -overnight 7/23-7/24 was hypoxic and had bradycardia with rates down to 40's w/ altered menattion - improved after BiPAP applied -likely a primary respiratory event   Hyperkalemia -given Kayexalate > resolved   UNSPECIFIED ANEMIA -Hgb has dipped -anemia panel is pending - will  transfuse w/ goal to keep Hgb 8.0 or > given acute CHF and hx of CAD - stop lovenox until clear if pt bleeding or not   HYPERTENSION -controlled  CAD, NATIVE VESSEL  DYSLIPIDEMIA  DVT prophylaxis: SCDs only due to anemia  Code Status: Full Family Communication: No family at bedside Disposition Plan/Expected LOS: Step down  Consultants: Cardiology  Procedures: None  Antibiotics: Zithromax 7/23 >>> Rocephin 7/23 >>>  Objective: Blood pressure 115/59, pulse 72, temperature 98 F (36.7 C), temperature source Oral, resp. rate 16, height 5\' 3"  (1.6 m), weight 89.1 kg (196 lb 6.9 oz), SpO2 97.00%.  Intake/Output Summary (Last 24 hours) at 05/07/14 1554 Last data filed at 05/07/14 1510  Gross per 24 hour  Intake   1358 ml  Output   2825 ml  Net  -1467 ml   Exam: General: No acute respiratory distress-stable now on [place] oxygen Lungs: Coarse bilaterally with diffuse crackles  Cardiovascular: Regular rate and rhythm without gallop or rub normal S1 and S2, trace peripheral edema  Abdomen: Nontender, nondistended, soft, bowel sounds positive, no rebound, no ascites, no appreciable mass Musculoskeletal: No significant cyanosis, clubbing of bilateral lower extremities  Scheduled Meds:  Scheduled Meds: . allopurinol  200 mg Oral Daily  . amLODipine  5 mg Oral Daily  . aspirin  81 mg Oral Daily  . atorvastatin  20 mg Oral q1800  . atropine  1 drop Left Eye BID  . azithromycin  500 mg Intravenous Q24H  . cefTRIAXone (ROCEPHIN)  IV  1 g Intravenous Q24H  . citalopram  20 mg Oral Daily  . dorzolamide-timolol  1 drop Both Eyes  BID  . enoxaparin (LOVENOX) injection  30 mg Subcutaneous Q24H  . furosemide  80 mg Intravenous BID  . gabapentin  600 mg Oral BID  . hydrALAZINE  100 mg Oral TID  . insulin aspart  0-5 Units Subcutaneous QHS  . insulin aspart  0-9 Units Subcutaneous TID WC  . insulin aspart  3 Units Subcutaneous TID WC  . insulin glargine  5 Units Subcutaneous QHS  .  latanoprost  1 drop Left Eye QHS  . pantoprazole  40 mg Oral Daily  . sodium chloride  3 mL Intravenous Q12H   Data Reviewed: Basic Metabolic Panel:  Recent Labs Lab 05/05/14 0919 05/06/14 0150 05/06/14 0242 05/06/14 1233 05/07/14 0319  NA 141 141 142  --  143  K 5.3 5.5* 5.6* 4.6 4.1  CL 106 103 104  --  101  CO2 19 20 25   --  27  GLUCOSE 255* 179* 150*  --  125*  BUN 26* 31* 31*  --  36*  CREATININE 1.82* 2.06* 2.14*  --  2.20*  CALCIUM 9.0 8.9 8.9  --  9.0   Liver Function Tests:  Recent Labs Lab 05/05/14 0919 05/07/14 0319  AST 15 13  ALT 8 <5  ALKPHOS 138* 106  BILITOT 0.3 0.3  PROT 7.5 6.6  ALBUMIN 3.6 2.9*   No results found for this basename: LIPASE, AMYLASE,  in the last 168 hours No results found for this basename: AMMONIA,  in the last 168 hours  CBC:  Recent Labs Lab 05/05/14 0919 05/06/14 0150 05/07/14 0319  WBC 18.2* 13.6* 9.9  NEUTROABS 16.3*  --   --   HGB 7.2* 7.1* 6.8*  HCT 24.5* 23.5* 22.6*  MCV 79.8 78.6 80.4  PLT 217 PLATELET CLUMPS NOTED ON SMEAR 173   Cardiac Enzymes:  Recent Labs Lab 05/05/14 1705 05/05/14 2033 05/06/14 0242  TROPONINI <0.30 <0.30 0.42*   BNP (last 3 results)  Recent Labs  11/11/13 0655 11/14/13 0406 05/05/14 0919  PROBNP 1922.0* 1073.0* 2645.0*   CBG:  Recent Labs Lab 05/06/14 1125 05/06/14 1625 05/06/14 2144 05/07/14 0724 05/07/14 1214  GLUCAP 122* 91 122* 157* 140*    Recent Results (from the past 240 hour(s))  URINE CULTURE     Status: None   Collection Time    05/05/14 10:18 AM      Result Value Ref Range Status   Specimen Description URINE, CATHETERIZED   Final   Special Requests NONE   Final   Culture  Setup Time     Final   Value: 05/05/2014 18:35     Performed at [place]     Final   Value: NO GROWTH     Performed at [facility]   Culture     Final   Value: NO GROWTH     Performed at [facility]   Report Status 05/06/2014  FINAL   Final  MRSA PCR SCREENING     Status: None   Collection Time    05/05/14  2:40 PM      Result Value Ref Range Status   MRSA by PCR NEGATIVE  NEGATIVE Final   Comment:            The GeneXpert MRSA Assay (FDA     approved for NASAL specimens     only), is one component of a     comprehensive MRSA colonization     surveillance program. It is not  intended to diagnose MRSA     infection nor to guide or     monitor treatment for     MRSA infections.  CULTURE, BLOOD (ROUTINE X 2)     Status: None   Collection Time    05/06/14  7:20 AM      Result Value Ref Range Status   Specimen Description BLOOD RIGHT HAND   Final   Special Requests BOTTLES DRAWN AEROBIC ONLY 3CC   Final   Culture  Setup Time     Final   Value: 05/06/2014 12:53     Performed at [facility]   Culture     Final   Value:        BLOOD CULTURE RECEIVED NO GROWTH TO DATE CULTURE WILL BE HELD FOR 5 DAYS BEFORE ISSUING A FINAL NEGATIVE REPORT     Performed at [facility]   Report Status PENDING   Incomplete  CULTURE, BLOOD (ROUTINE X 2)     Status: None   Collection Time    05/06/14  7:40 AM      Result Value Ref Range Status   Specimen Description BLOOD RIGHT HAND   Final   Special Requests BOTTLES DRAWN AEROBIC ONLY 2CC   Final   Culture  Setup Time     Final   Value: 05/06/2014 12:53     Performed at [facility]   Culture     Final   Value:        BLOOD CULTURE RECEIVED NO GROWTH TO DATE CULTURE WILL BE HELD FOR 5 DAYS BEFORE ISSUING A FINAL NEGATIVE REPORT     Performed at [facility]   Report Status PENDING   Incomplete     Studies:  Recent x-ray studies have been reviewed in detail by the Attending Physician  Time spent :  44 mins  Cherene Altes, MD [facility] For Consults/Admissions - Flow Manager - [phone] Office  [phone] Pager [phone]  On-Call/Text Page:      [email or web address]      password [facility]   05/07/2014, 3:54 PM   LOS: 2  days

## 2014-05-08 ENCOUNTER — Inpatient Hospital Stay (HOSPITAL_COMMUNITY): Payer: Medicare Other

## 2014-05-08 LAB — CBC
HCT: 23.8 % — ABNORMAL LOW (ref 36.0–46.0)
HEMATOCRIT: 28.9 % — AB (ref 36.0–46.0)
HEMOGLOBIN: 9 g/dL — AB (ref 12.0–15.0)
Hemoglobin: 7.3 g/dL — ABNORMAL LOW (ref 12.0–15.0)
MCH: 24.3 pg — AB (ref 26.0–34.0)
MCH: 25 pg — ABNORMAL LOW (ref 26.0–34.0)
MCHC: 30.7 g/dL (ref 30.0–36.0)
MCHC: 31.1 g/dL (ref 30.0–36.0)
MCV: 79.1 fL (ref 78.0–100.0)
MCV: 80.3 fL (ref 78.0–100.0)
PLATELETS: 193 10*3/uL (ref 150–400)
Platelets: 193 10*3/uL (ref 150–400)
RBC: 3.01 MIL/uL — ABNORMAL LOW (ref 3.87–5.11)
RBC: 3.6 MIL/uL — ABNORMAL LOW (ref 3.87–5.11)
RDW: 18 % — AB (ref 11.5–15.5)
RDW: 17.9 % — ABNORMAL HIGH (ref 11.5–15.5)
WBC: 9.6 10*3/uL (ref 4.0–10.5)
WBC: 10.4 10*3/uL (ref 4.0–10.5)

## 2014-05-08 LAB — BASIC METABOLIC PANEL
Anion gap: 15 (ref 5–15)
BUN: 47 mg/dL — ABNORMAL HIGH (ref 6–23)
CALCIUM: 8.9 mg/dL (ref 8.4–10.5)
CO2: 28 mEq/L (ref 19–32)
CREATININE: 2.39 mg/dL — AB (ref 0.50–1.10)
Chloride: 99 mEq/L (ref 96–112)
GFR calc Af Amer: 20 mL/min — ABNORMAL LOW (ref >90)
GFR, EST NON AFRICAN AMERICAN: 18 mL/min — AB (ref >90)
GLUCOSE: 130 mg/dL — AB (ref 70–99)
Potassium: 3.7 mEq/L (ref 3.7–5.3)
SODIUM: 142 meq/L (ref 137–147)

## 2014-05-08 LAB — VITAMIN B12: Vitamin B-12: 588 pg/mL (ref 211–911)

## 2014-05-08 LAB — PREPARE RBC (CROSSMATCH)

## 2014-05-08 LAB — FERRITIN: Ferritin: 20 ng/mL (ref 10–291)

## 2014-05-08 LAB — FOLATE: Folate: 12.9 ng/mL

## 2014-05-08 LAB — GLUCOSE, CAPILLARY
GLUCOSE-CAPILLARY: 158 mg/dL — AB (ref 70–99)
GLUCOSE-CAPILLARY: 153 mg/dL — AB (ref 70–99)
Glucose-Capillary: 126 mg/dL — ABNORMAL HIGH (ref 70–99)
Glucose-Capillary: 195 mg/dL — ABNORMAL HIGH (ref 70–99)

## 2014-05-08 MED ORDER — GABAPENTIN 600 MG PO TABS
300.0000 mg | ORAL_TABLET | Freq: Two times a day (BID) | ORAL | Status: DC
  Filled 2014-05-08: qty 0.5

## 2014-05-08 MED ORDER — GABAPENTIN 300 MG PO CAPS
300.0000 mg | ORAL_CAPSULE | Freq: Two times a day (BID) | ORAL | Status: DC
  Administered 2014-05-08 – 2014-05-13 (×10): 300 mg via ORAL
  Filled 2014-05-08 (×11): qty 1

## 2014-05-08 MED ORDER — FUROSEMIDE 80 MG PO TABS
80.0000 mg | ORAL_TABLET | Freq: Every day | ORAL | Status: DC
  Administered 2014-05-08 – 2014-05-11 (×4): 80 mg via ORAL
  Filled 2014-05-08 (×5): qty 1

## 2014-05-08 NOTE — Progress Notes (Signed)
[facility]  [name]:[id number] DOB: 01-09-1928 DOA: 05/05/2014 PCP: Adella Hare, MD  Brief narrative: 80 y.o. female with history of CAD (status post stenting), hypertension, hyperlipidemia, renal artery stenosis, type II DM with nephropathy, chronic kidney disease stage III-IV, gout, chronic anemia, GERD and visually impaired who presented to the ED with SOB. She stated that she had been feeling poorly for about 24 hours which was associated with mild dyspnea.. She denied noncompliance with fluid restriction. She endorsed mild leg swelling. EMS was called to the home due to progression of symptoms and found her with oxygen saturation in the 50s in respiratory extremis.   On arrival to the ED, she was saturating at 99% with nonrebreather mask but had difficulty speaking short sentences. She received prolonged nebulization after arrival to the ER, with 40 mg IV Lasix x 2 and endorsed improvement in her breathing. In the ED, glucose 255, creatinine 1.82, WBC 18.2, hemoglobin 7.2, proBNP 2645 and chest x-ray compatible with CHF.  HPI/Subjective: Pt is resting comfortably.  She states her breathing is much better.  She denies cp, n/v, or abdom pain.    Assessment/Plan:  Acute hypoxic respiratory failure with hypoxia:    A) Acute on chronic diastolic HF  -pt is now negative ~6.4L - her weight appears to be inaccurate - clinically she is much improved - Cardiology is following this issue as well     B) Community acquired pneumonia  -cont empiric abx - O2 requirements decreasing - now afebrile and WBC declining - will obtain f/u CXR in AM - attempt to wean to RA  Normo/microcytic anemia -Hgb has dipped as low as 6.8 - s/p 1U PRBC yesterday w/ muted response (peak Hgb 7.5) - anemia panel is c/w Fe deficiency - will transfuse again today (unit #2) due to Hgb dropping again to 7.3 w/ goal to keep Hgb 8.0 or > given acute CHF and hx of CAD -  stopped lovenox until clear if pt actively bleeding - await stool guiacs - uncontrasted CT abdom to [facility] for [facility]   DM -currently controlled with Lantus and SSI  Chronic kidney disease, stage IV  -crt has been steadily rising - may need to slow/hold further diuresis (Cardiolgy is attending to this issue) - recheck renal function in AM - tranfuse PRBC again today   Symptomatic bradycardia -overnight 7/23-7/24 was hypoxic and had bradycardia with rates down to 40's w/ altered menattion - improved after BiPAP applied - likely a primary respiratory event - no recurrence since that one event   Hyperkalemia -given Kayexalate > resolved   HYPERTENSION -controlled  CAD, NATIVE VESSEL  DYSLIPIDEMIA  DVT prophylaxis: SCDs only due to anemia  Code Status: Full Family Communication: No family at bedside Disposition Plan/Expected LOS: Step down  Consultants: Cardiology  Procedures: None  Antibiotics: Zithromax 7/23 >>> Rocephin 7/23 >>>  Objective: Blood pressure 137/55, pulse 73, temperature 98.5 F (36.9 C), temperature source Oral, resp. rate 18, height 5\' 3"  (1.6 m), weight 91.6 kg (201 lb 15.1 oz), SpO2 100.00%.  Intake/Output Summary (Last 24 hours) at 05/08/14 1115 Last data filed at 05/08/14 0900  Gross per 24 hour  Intake    885 ml  Output   3000 ml  Net  -2115 ml   Exam: General: No acute respiratory distress - stable on  oxygen Lungs: better air movement th/o - no focal crackles or wheezes Cardiovascular: Regular rate and rhythm without gallop  or rub, trace peripheral edema  Abdomen: Nontender, nondistended, soft, bowel sounds positive, no rebound, no ascites, no appreciable mass - no flank ecchymosis Musculoskeletal: No significant cyanosis, clubbing of bilateral lower extremities  Scheduled Meds:  Scheduled Meds: . allopurinol  200 mg Oral Daily  . amLODipine  5 mg Oral Daily  . aspirin  81 mg Oral Daily  . atorvastatin  20 mg Oral q1800  . atropine  1  drop Left Eye BID  . azithromycin  500 mg Intravenous Q24H  . cefTRIAXone (ROCEPHIN)  IV  1 g Intravenous Q24H  . citalopram  20 mg Oral Daily  . dorzolamide-timolol  1 drop Both Eyes BID  . furosemide  80 mg Intravenous BID  . gabapentin  600 mg Oral BID  . hydrALAZINE  100 mg Oral TID  . insulin aspart  0-5 Units Subcutaneous QHS  . insulin aspart  0-9 Units Subcutaneous TID WC  . insulin aspart  3 Units Subcutaneous TID WC  . insulin glargine  5 Units Subcutaneous QHS  . latanoprost  1 drop Left Eye QHS  . pantoprazole  40 mg Oral Daily  . sodium chloride  3 mL Intravenous Q12H   Data Reviewed: Basic Metabolic Panel:  Recent Labs Lab 05/05/14 0919 05/06/14 0150 05/06/14 0242 05/06/14 1233 05/07/14 0319 05/08/14 0258  NA 141 141 142  --  143 142  K 5.3 5.5* 5.6* 4.6 4.1 3.7  CL 106 103 104  --  101 99  CO2 19 20 25   --  27 28  GLUCOSE 255* 179* 150*  --  125* 130*  BUN 26* 31* 31*  --  36* 47*  CREATININE 1.82* 2.06* 2.14*  --  2.20* 2.39*  CALCIUM 9.0 8.9 8.9  --  9.0 8.9   Liver Function Tests:  Recent Labs Lab 05/05/14 0919 05/07/14 0319  AST 15 13  ALT 8 <5  ALKPHOS 138* 106  BILITOT 0.3 0.3  PROT 7.5 6.6  ALBUMIN 3.6 2.9*   CBC:  Recent Labs Lab 05/05/14 0919 05/06/14 0150 05/07/14 0319 05/07/14 1847 05/08/14 0258  WBC 18.2* 13.6* 9.9 9.2 9.6  NEUTROABS 16.3*  --   --   --   --   HGB 7.2* 7.1* 6.8* 7.5* 7.3*  HCT 24.5* 23.5* 22.6* 24.0* 23.8*  MCV 79.8 78.6 80.4 79.2 79.1  PLT 217 PLATELET CLUMPS NOTED ON SMEAR 173 201 193   Cardiac Enzymes:  Recent Labs Lab 05/05/14 1705 05/05/14 2033 05/06/14 0242  TROPONINI <0.30 <0.30 0.42*   BNP (last 3 results)  Recent Labs  11/11/13 0655 11/14/13 0406 05/05/14 0919  PROBNP 1922.0* 1073.0* 2645.0*   CBG:  Recent Labs Lab 05/07/14 0724 05/07/14 1214 05/07/14 1613 05/07/14 2139 05/08/14 0742  GLUCAP 157* 140* 132* 157* 158*    Recent Results (from the past 240 hour(s))  URINE  CULTURE     Status: None   Collection Time    05/05/14 10:18 AM      Result Value Ref Range Status   Specimen Description URINE, CATHETERIZED   Final   Special Requests NONE   Final   Culture  Setup Time     Final   Value: 05/05/2014 18:35     Performed at [facility]     Final   Value: NO GROWTH     Performed at [facility]   Culture     Final   Value: NO GROWTH     Performed  at [facility]   Report Status 05/06/2014 FINAL   Final  MRSA PCR SCREENING     Status: None   Collection Time    05/05/14  2:40 PM      Result Value Ref Range Status   MRSA by PCR NEGATIVE  NEGATIVE Final   Comment:            The GeneXpert MRSA Assay (FDA     approved for NASAL specimens     only), is one component of a     comprehensive MRSA colonization     surveillance program. It is not     intended to diagnose MRSA     infection nor to guide or     monitor treatment for     MRSA infections.  CULTURE, BLOOD (ROUTINE X 2)     Status: None   Collection Time    05/06/14  7:20 AM      Result Value Ref Range Status   Specimen Description BLOOD RIGHT HAND   Final   Special Requests BOTTLES DRAWN AEROBIC ONLY 3CC   Final   Culture  Setup Time     Final   Value: 05/06/2014 12:53     Performed at [facility]   Culture     Final   Value:        BLOOD CULTURE RECEIVED NO GROWTH TO DATE CULTURE WILL BE HELD FOR 5 DAYS BEFORE ISSUING A FINAL NEGATIVE REPORT     Performed at [facility]   Report Status PENDING   Incomplete  CULTURE, BLOOD (ROUTINE X 2)     Status: None   Collection Time    05/06/14  7:40 AM      Result Value Ref Range Status   Specimen Description BLOOD RIGHT HAND   Final   Special Requests BOTTLES DRAWN AEROBIC ONLY 2CC   Final   Culture  Setup Time     Final   Value: 05/06/2014 12:53     Performed at [facility]   Culture     Final   Value:        BLOOD CULTURE RECEIVED NO GROWTH TO DATE CULTURE WILL BE HELD FOR  5 DAYS BEFORE ISSUING A FINAL NEGATIVE REPORT     Performed at [facility]   Report Status PENDING   Incomplete     Studies:  Recent x-ray studies have been reviewed in detail by the Attending Physician  Time spent :  92 mins  Cherene Altes, MD [facility] For Consults/Admissions - Flow Manager - [phone] Office  [phone] Pager [phone]  On-Call/Text Page:      [email or web address]      password [facility]   05/08/2014, 11:15 AM   LOS: 3 days

## 2014-05-08 NOTE — Progress Notes (Addendum)
    Subjective:  Denies CP; dyspnea resolved   Objective:  Filed Vitals:   05/08/14 0800 05/08/14 0900 05/08/14 1000 05/08/14 1100  BP: 129/50 146/60 142/51 137/55  Pulse: 73 74 74 73  Temp:    98.4 F (36.9 C)  TempSrc:    Oral  Resp: 14 13 23 18   Height:      Weight:      SpO2: 99% 97% 100% 100%    Intake/Output from previous day:  Intake/Output Summary (Last 24 hours) at 05/08/14 1209 Last data filed at 05/08/14 0900  Gross per 24 hour  Intake    885 ml  Output   3000 ml  Net  -2115 ml    Physical Exam: Physical exam: Well-developed obese in no acute distress.  Skin is warm and dry.  Neck is supple.  Chest with diminished BS bases Cardiovascular exam is regular rate and rhythm.  Abdominal exam nontender or distended. No masses palpated. Extremities show no edema. neuro grossly intact    Lab Results: Basic Metabolic Panel:  Recent Labs  05/07/14 0319 05/08/14 0258  NA 143 142  K 4.1 3.7  CL 101 99  CO2 27 28  GLUCOSE 125* 130*  BUN 36* 47*  CREATININE 2.20* 2.39*  CALCIUM 9.0 8.9   CBC:  Recent Labs  05/07/14 1847 05/08/14 0258  WBC 9.2 9.6  HGB 7.5* 7.3*  HCT 24.0* 23.8*  MCV 79.2 79.1  PLT 201 193   Cardiac Enzymes:  Recent Labs  05/05/14 1705 05/05/14 2033 05/06/14 0242  TROPONINI <0.30 <0.30 0.42*     Assessment/Plan:  1 acute on chronic diastolic congestive heart failure-patient's volume status has improved. Change lasix to 80 mg po daily. Follow renal function. 2 pneumonia-continue antibiotics.  3 acute on chronic kidney failure-follow renal function with diuresis; Cr worse this AM. 4 history of coronary artery disease-continue aspirin and statin. 5 recent episode of bradycardia-felt to be respiratory mediated. Review of telemetry shows no further events. 6 anemia-management per primary care. 7 FU troponin minimally increased; no plans for further ischemia eval.  Kirk Ruths 05/08/2014, 12:09 PM

## 2014-05-09 ENCOUNTER — Inpatient Hospital Stay (HOSPITAL_COMMUNITY): Payer: Medicare Other

## 2014-05-09 LAB — TYPE AND SCREEN
ABO/RH(D): B POS
Antibody Screen: NEGATIVE
Unit division: 0
Unit division: 0

## 2014-05-09 LAB — COMPREHENSIVE METABOLIC PANEL
ALT: 6 U/L (ref 0–35)
AST: 12 U/L (ref 0–37)
Albumin: 2.9 g/dL — ABNORMAL LOW (ref 3.5–5.2)
Alkaline Phosphatase: 96 U/L (ref 39–117)
Anion gap: 16 — ABNORMAL HIGH (ref 5–15)
BUN: 50 mg/dL — ABNORMAL HIGH (ref 6–23)
CO2: 29 meq/L (ref 19–32)
CREATININE: 1.98 mg/dL — AB (ref 0.50–1.10)
Calcium: 9.4 mg/dL (ref 8.4–10.5)
Chloride: 96 mEq/L (ref 96–112)
GFR, EST AFRICAN AMERICAN: 26 mL/min — AB (ref >90)
GFR, EST NON AFRICAN AMERICAN: 22 mL/min — AB (ref >90)
GLUCOSE: 152 mg/dL — AB (ref 70–99)
Potassium: 3.8 mEq/L (ref 3.7–5.3)
SODIUM: 141 meq/L (ref 137–147)
Total Bilirubin: 0.3 mg/dL (ref 0.3–1.2)
Total Protein: 7 g/dL (ref 6.0–8.3)

## 2014-05-09 LAB — CBC
HEMATOCRIT: 29.1 % — AB (ref 36.0–46.0)
Hemoglobin: 9.2 g/dL — ABNORMAL LOW (ref 12.0–15.0)
MCH: 25.3 pg — ABNORMAL LOW (ref 26.0–34.0)
MCHC: 31.6 g/dL (ref 30.0–36.0)
MCV: 80.2 fL (ref 78.0–100.0)
Platelets: 212 10*3/uL (ref 150–400)
RBC: 3.63 MIL/uL — ABNORMAL LOW (ref 3.87–5.11)
RDW: 18 % — AB (ref 11.5–15.5)
WBC: 10.3 10*3/uL (ref 4.0–10.5)

## 2014-05-09 LAB — GLUCOSE, CAPILLARY
GLUCOSE-CAPILLARY: 149 mg/dL — AB (ref 70–99)
GLUCOSE-CAPILLARY: 172 mg/dL — AB (ref 70–99)
GLUCOSE-CAPILLARY: 208 mg/dL — AB (ref 70–99)
Glucose-Capillary: 200 mg/dL — ABNORMAL HIGH (ref 70–99)
Glucose-Capillary: 136 mg/dL — ABNORMAL HIGH (ref 70–99)

## 2014-05-09 MED ORDER — AZITHROMYCIN 500 MG PO TABS
500.0000 mg | ORAL_TABLET | Freq: Every day | ORAL | Status: AC
  Administered 2014-05-10 – 2014-05-12 (×3): 500 mg via ORAL
  Filled 2014-05-09 (×3): qty 1

## 2014-05-09 MED ORDER — SODIUM CHLORIDE 0.9 % IV SOLN
500.0000 mg | Freq: Every day | INTRAVENOUS | Status: AC
  Administered 2014-05-09 – 2014-05-10 (×2): 500 mg via INTRAVENOUS
  Filled 2014-05-09 (×4): qty 10

## 2014-05-09 MED ORDER — SODIUM CHLORIDE 0.9 % IV SOLN
25.0000 mg | Freq: Once | INTRAVENOUS | Status: AC
  Administered 2014-05-09: 25 mg via INTRAVENOUS
  Filled 2014-05-09: qty 0.5

## 2014-05-09 NOTE — Evaluation (Signed)
Occupational Therapy Evaluation Patient Details Name: [name] MRN: [id number] DOB: 1928/01/18 Today's Date: 05/09/2014    History of Present Illness Pt is 80 y/o female with history of CAD (status post stenting), hypertension, hyperlipidemia, renal artery stenosis, type II DM with nephropathy, chronic kidney disease stage III-4, gout, chronic anemia, GERD and visually impaired. Presented to the ED with SOB. EMS was called to the home due to progression of symptoms and found her with oxygen saturation in the 50s.      Clinical Impression   Pt admitted with above. She demonstrates the below listed deficits and will benefit from continued OT to maximize safety and independence with BADLs.  Pt presents to OT with deconditioning/generalized weakness, and decreased balance.  She has a h/o low vision.  Pt is currently requiring min - mod A for BADLs assist is impart due to visual deficits and the unfamiliarity of environment an objects.   Anticipate she will progress well and be able to return home with her family providing 24 hour supervision.       Follow Up Recommendations  Home health OT;Supervision/Assistance - 24 hour    Equipment Recommendations  None recommended by OT    Recommendations for Other Services       Precautions / Restrictions Precautions Precautions: Fall      Mobility Bed Mobility Overal bed mobility: Needs Assistance Bed Mobility: Supine to Sit     Supine to sit: Min assist;HOB elevated     General bed mobility comments: (A) to elevate trunk OOB with cues for proper technique and body position with hand rail .  Transfers Overall transfer level: Needs assistance Equipment used: 1 person hand held assist Transfers: Sit to/from [facility] Sit to Stand: Min assist Stand pivot transfers: Min assist       General transfer comment: Pt requires cues for hand placement.  Assist to lift bottom from chair x 1    Balance Overall balance  assessment: Needs assistance Sitting-balance support: Feet supported Sitting balance-Leahy Scale: Good     Standing balance support: During functional activity Standing balance-Leahy Scale: Fair                              ADL Overall ADL's : Needs assistance/impaired Eating/Feeding: Set up   Grooming: Wash/dry hands;Wash/dry face;Oral care;Minimal assistance;Standing   Upper Body Bathing: Set up;Sitting   Lower Body Bathing: Minimal assistance;Sit to/from stand   Upper Body Dressing : Minimal assistance;Sitting   Lower Body Dressing: Sit to/from stand;Moderate assistance Lower Body Dressing Details (indicate cue type and reason): vision deficits impede performance due to items and set up are not familiar to pt Toilet Transfer: Minimal assistance;Comfort height toilet;Grab bars   Toileting- Clothing Manipulation and Hygiene: Moderate assistance;Sit to/from stand Toileting - Clothing Manipulation Details (indicate cue type and reason): assist to pull pants over hips and to slide them over her feet due to dizziness when leaning forward.  Pt reports intermittent dizziness at home.      Functional mobility during ADLs: Minimal assistance;Rolling walker General ADL Comments: Pt very motivated     Vision                     Perception     Praxis      Pertinent Vitals/Pain Denies pain.  VSS throughout eval.     Hand Dominance Right   Extremity/Trunk Assessment Upper Extremity Assessment Upper Extremity Assessment: Generalized weakness  Lower Extremity Assessment Lower Extremity Assessment: Generalized weakness       Communication Communication Communication: No difficulties   Cognition Arousal/Alertness: Awake/alert Behavior During Therapy: WFL for tasks assessed/performed Overall Cognitive Status: Within Functional Limits for tasks assessed                     General Comments       Exercises Exercises: General Lower  Extremity     Shoulder Instructions      Home Living Family/patient expects to be discharged to:: Private residence Living Arrangements: Children Available Help at Discharge: Family;Available 24 hours/day Type of Home: House Home Access: Level entry     Home Layout: One level     Bathroom Shower/Tub: Tub/shower unit;Curtain Shower/tub characteristics: Architectural technologist: Standard     Home Equipment: Environmental consultant - 2 wheels;Shower seat          Prior Functioning/Environment Level of Independence: Independent with assistive device(s)        Comments: pt and family reports pt ambulates with RW when needed; in house at times may not use RW; pt is independent with ADLs; son is responsible for house upkeep and cooking    OT Diagnosis: Generalized weakness   OT Problem List: Decreased strength;Decreased activity tolerance;Impaired balance (sitting and/or standing);Impaired vision/perception;Decreased safety awareness;Decreased knowledge of use of DME or AE   OT Treatment/Interventions: Self-care/ADL training;DME and/or AE instruction;Therapeutic activities;Patient/family education;Visual/perceptual remediation/compensation;Balance training    OT Goals(Current goals can be found in the care plan section) Acute Rehab OT Goals Patient Stated Goal: to go home OT Goal Formulation: With patient Time For Goal Achievement: 05/23/14 Potential to Achieve Goals: Good ADL Goals Pt Will Perform Grooming: with set-up;with supervision;standing Pt Will Perform Upper Body Bathing: with set-up;with supervision;sitting Pt Will Perform Lower Body Bathing: with set-up;with supervision;sit to/from stand Pt Will Perform Upper Body Dressing: with set-up;with supervision;sitting Pt Will Perform Lower Body Dressing: with set-up;with supervision;sit to/from stand Pt Will Transfer to Toilet: with set-up;with supervision;ambulating;regular height toilet;grab bars Pt Will Perform Toileting - Clothing  Manipulation and hygiene: with set-up;with supervision;sit to/from stand  OT Frequency: Min 2X/week   Barriers to D/C:            Co-evaluation              End of Session Equipment Utilized During Treatment: Surveyor, mining Communication: Mobility status  Activity Tolerance: Patient tolerated treatment well Patient left: in chair;with call bell/phone within reach;with chair alarm set   Time: 7672-0947 OT Time Calculation (min): 40 min Charges:  OT General Charges $OT Visit: 1 Procedure OT Evaluation $Initial OT Evaluation Tier I: 1 Procedure OT Treatments $Self Care/Home Management : 23-37 mins G-Codes:    Trinette Vera M May 14, 2014, 1:33 PM

## 2014-05-09 NOTE — Progress Notes (Signed)
Patient Name: [name] Date of Encounter: 05/09/2014     Active Problems:   IDDM   DYSLIPIDEMIA   UNSPECIFIED ANEMIA   HYPERTENSION   CAD, NATIVE VESSEL   Chronic kidney disease, stage IV (severe)   Acute on chronic diastolic HF (heart failure)   Acute respiratory failure with hypoxia   CAP (community acquired pneumonia)   Symptomatic bradycardia   Hyperkalemia    SUBJECTIVE  Denies any SOB or CP  CURRENT MEDS . allopurinol  200 mg Oral Daily  . amLODipine  5 mg Oral Daily  . aspirin  81 mg Oral Daily  . atorvastatin  20 mg Oral q1800  . atropine  1 drop Left Eye BID  . [START ON 05/10/2014] azithromycin  500 mg Oral Daily  . cefTRIAXone (ROCEPHIN)  IV  1 g Intravenous Q24H  . citalopram  20 mg Oral Daily  . dorzolamide-timolol  1 drop Both Eyes BID  . furosemide  80 mg Oral Daily  . gabapentin  300 mg Oral BID  . hydrALAZINE  100 mg Oral TID  . insulin aspart  0-5 Units Subcutaneous QHS  . insulin aspart  0-9 Units Subcutaneous TID WC  . insulin aspart  3 Units Subcutaneous TID WC  . insulin glargine  5 Units Subcutaneous QHS  . latanoprost  1 drop Left Eye QHS  . pantoprazole  40 mg Oral Daily  . sodium chloride  3 mL Intravenous Q12H    OBJECTIVE  Filed Vitals:   05/09/14 0500 05/09/14 0600 05/09/14 0700 05/09/14 0912  BP: 153/70 141/75 134/61 140/68  Pulse:    93  Temp:    98.9 F (37.2 C)  TempSrc:    Oral  Resp:    23  Height:      Weight:      SpO2:    100%    Intake/Output Summary (Last 24 hours) at 05/09/14 0920 Last data filed at 05/09/14 0224  Gross per 24 hour  Intake  707.5 ml  Output   2950 ml  Net -2242.5 ml   Filed Weights   05/07/14 0430 05/08/14 0421 05/09/14 0349  Weight: 196 lb 6.9 oz (89.1 kg) 201 lb 15.1 oz (91.6 kg) 188 lb 4.4 oz (85.4 kg)    PHYSICAL EXAM  General: Pleasant, NAD. Eating breakfast Neuro: Alert and oriented X 3. Moves all extremities spontaneously. Psych: Normal affect. HEENT:  Normal  Neck:  Supple without bruits or JVD. Lungs:  Resp regular and unlabored, decreased breath sound in bilateral basis Heart: RRR no s3, s4. 1/6 systolic murmur Abdomen: Soft, non-tender, non-distended, BS + x 4.  Extremities: No clubbing, cyanosis or edema. DP/PT/Radials 2+ and equal bilaterally.  Accessory Clinical Findings  CBC  Recent Labs  05/08/14 1819 05/09/14 0333  WBC 10.4 10.3  HGB 9.0* 9.2*  HCT 28.9* 29.1*  MCV 80.3 80.2  PLT 193 338   Basic Metabolic Panel  Recent Labs  05/08/14 0258 05/09/14 0333  NA 142 141  K 3.7 3.8  CL 99 96  CO2 28 29  GLUCOSE 130* 152*  BUN 47* 50*  CREATININE 2.39* 1.98*  CALCIUM 8.9 9.4   Liver Function Tests  Recent Labs  05/07/14 0319 05/09/14 0333  AST 13 12  ALT <5 6  ALKPHOS 106 96  BILITOT 0.3 0.3  PROT 6.6 7.0  ALBUMIN 2.9* 2.9*    TELE  NSR with HR 70-90s, with occasional PACs  ECG  No recent EKG  Radiology/Studies  Ct Abdomen Pelvis Wo Contrast  05/08/2014   CLINICAL DATA:  80 year old female with abdominal and pelvic pain. Evaluate for retroperitoneal hematoma.  EXAM: CT ABDOMEN AND PELVIS WITHOUT CONTRAST  TECHNIQUE: Multidetector CT imaging of the abdomen and pelvis was performed following the standard protocol without IV contrast.  COMPARISON:  02/24/2013 CT  FINDINGS: Cardiomegaly, small bilateral pleural effusions and bibasilar atelectasis noted.  The liver, spleen, adrenal glands and pancreas are unremarkable.  Bilateral renal atrophy identified.  Patient is status post cholecystectomy.  There is no evidence of free fluid, enlarged lymph nodes, biliary dilation or abdominal aortic aneurysm.  Colonic diverticulosis noted without diverticulitis.  There is no evidence of bowel obstruction, pneumoperitoneum or focal collection.  There is no evidence of abdominal, pelvic or retroperitoneal hematoma.  A calcified fibroid is noted.  Moderate atherosclerotic vascular and abdominal aortic calcifications noted.  A Foley  catheter within the bladder is present.  No acute or suspicious bony abnormalities are identified.  IMPRESSION: No evidence of abdominal, pelvic or retroperitoneal hematoma.  Small bilateral pleural effusions with bibasilar atelectasis.  Colonic diverticulosis without diverticulitis.  Cardiomegaly and abdominal aortic atherosclerotic disease.   Electronically Signed   By: Hassan Rowan M.D.   On: 05/08/2014 19:39   Dg Chest Port 1 View  05/09/2014   CLINICAL DATA:  Follow up infiltrate.  Respiratory distress.  EXAM: PORTABLE CHEST - 1 VIEW  COMPARISON:  05/06/2014.  FINDINGS: Mediastinum and hilar structures are normal. Cardiomegaly. Improving pulmonary vascular prominence. Interim near complete resolution of bilateral infiltrates. Small pleural effusion. These findings suggest improving congestive heart failure. No pneumothorax. No acute osseus abnormality.  IMPRESSION: Improving congestive heart failure with interval near complete resolution of pulmonary edema.   Electronically Signed   By: [place]   On: 05/09/2014 07:14   Dg Chest Port 1 View  05/06/2014   CLINICAL DATA:  Respiratory distress.  EXAM: PORTABLE CHEST - 1 VIEW  COMPARISON:  Chest radiograph performed 05/05/2014  FINDINGS: The lungs are well-aerated. There is persistent bilateral airspace opacification, perhaps slightly improved from the prior study. This may reflect multifocal pneumonia or pulmonary edema. Small bilateral pleural effusions are suspected. No pneumothorax is seen.  The cardiomediastinal silhouette is mildly enlarged. No acute osseous abnormalities are seen. An external pacing pad is noted overlying the right hemithorax.  IMPRESSION: 1. Persistent bilateral airspace opacification, perhaps slightly improved from the prior study. This may reflect multifocal pneumonia or pulmonary edema. Suspect small bilateral pleural effusions. 2. Mild cardiomegaly.   Electronically Signed   By: Garald Balding M.D.   On: 05/06/2014 02:18    Dg Chest Port 1 View  05/05/2014   CLINICAL DATA:  Generalized weakness and nausea.  EXAM: PORTABLE CHEST - 1 VIEW  COMPARISON:  11/13/2013.  FINDINGS: Support apparatus: Monitoring leads project over the chest.  Cardiomediastinal Silhouette: Partially obscured. Cardiopericardial silhouette does appear enlarged.  Lungs: Diffuse bilateral RIGHT-greater-than-LEFT airspace disease, with a perihilar and basilar predominant distribution. Consolidation at both lung bases, greater on the RIGHT than LEFT. No pneumothorax.  Effusions:  Probable small RIGHT pleural effusion.  Other:  None.  IMPRESSION: Constellation of findings most compatible with moderate CHF. Airspace disease is favored to represent asymmetric/atypical pulmonary edema. Superimposed pneumonia is less unlikely but cannot be excluded.   Electronically Signed   By: Dereck Ligas M.D.   On: 05/05/2014 09:29    ASSESSMENT AND PLAN  1 acute on chronic diastolic congestive heart failure  - CXR shows resolution of CHF.  Continue lasix 80mg  daily for now (on 60mg  daily at home previously)   - stable to transfer to floor, likely able to be discharged soon  2 pneumonia-continue antibiotics.   3 acute on chronic kidney failure  - renal function stable with diuresis, Cr improved to 1.98 this morning from from 2.39 4 history of coronary artery disease-continue aspirin and statin. 5. Anemia  - s/p 2 unit PRBC yesterday, hgb stable >9 this am  - CT of abdomen 7/26 no sign of retroperineal bleeding, does have diverticulosis 6. Bradycardia - felt to be related to respiratory issue, resolved, no recurrence on tele Signed,  7. Elevated troponin 7/24, no plan for ischemic eval per Dr. Stanford Breed, currently asymptomatic   Signed, Almyra Deforest PA-C Pager: [phone]  I have personally seen and examined this patient with Almyra Deforest, PA-C. I agree with the assessment and plan as outlined above. Pt is doing welll. Continue po Lasix. No further cardiac workup.    MCALHANY,CHRISTOPHER 05/09/2014 12:33 PM

## 2014-05-09 NOTE — Progress Notes (Signed)
Physical Therapy Treatment Patient Details Name: [name] MRN: [id number] DOB: 09/19/1928 Today's Date: 05/09/2014    History of Present Illness Pt is 80 y/o female with history of CAD (status post stenting), hypertension, hyperlipidemia, renal artery stenosis, type II DM with nephropathy, chronic kidney disease stage III-4, gout, chronic anemia, GERD and visually impaired. Presented to the ED with SOB. EMS was called to the home due to progression of symptoms and found her with oxygen saturation in the 50s.       PT Comments    Mobilizing better today with HR and SaO2 WNL while walking. Pt required min assist primarily due to low vision and multiple lines/tubes.   Follow Up Recommendations  Home health PT;Supervision/Assistance - 24 hour     Equipment Recommendations  None recommended by PT    Recommendations for Other Services       Precautions / Restrictions Precautions Precautions: Fall    Mobility  Bed Mobility Overal bed mobility: Needs Assistance Bed Mobility: Supine to Sit     Supine to sit: Min assist;HOB elevated     General bed mobility comments: (A) to elevate trunk OOB with cues for proper technique and body position with hand rail .  Transfers Overall transfer level: Needs assistance Equipment used: 1 person hand held assist Transfers: Sit to/from Stand Sit to Stand: Min assist         General transfer comment: (A) to initiate transfer with max cues for hand placement & LE placement.    Ambulation/Gait Ambulation/Gait assistance: Min assist;+2 safety/equipment Ambulation Distance (Feet): 55 Feet Assistive device: Rolling walker (2 wheeled) Gait Pattern/deviations: Step-through pattern;Decreased stride length;Drifts right/left;Trunk flexed Gait velocity: decreased   General Gait Details: Pt with low vision and required assist to maneuver RW during turns (and to prevent drifting to her Left)   Stairs            Wheelchair Mobility    Modified Rankin (Stroke Patients Only)       Balance                                    Cognition Arousal/Alertness: Awake/alert Behavior During Therapy: WFL for tasks assessed/performed Overall Cognitive Status: Within Functional Limits for tasks assessed                      Exercises General Exercises - Lower Extremity Ankle Circles/Pumps: Both;20 reps Short Arc Quad: Both;10 reps    General Comments        Pertinent Vitals/Pain Pt reported dizziness upon initial sitting and standing, however BP stable and dizziness did not get any worse while walking.   Denied pain    Home Living                      Prior Function            PT Goals (current goals can now be found in the care plan section) Acute Rehab PT Goals Patient Stated Goal: Pt wants to get back to walking and return home Progress towards PT goals: Progressing toward goals    Frequency  Min 3X/week    PT Plan Current plan remains appropriate    Co-evaluation             End of Session Equipment Utilized During Treatment: Oxygen;Gait belt Activity Tolerance: Patient limited by fatigue Patient left: with call bell/phone within  reach;in chair;with chair alarm set     Time: [phone] PT Time Calculation (min): 24 min  Charges:  $Gait Training: 23-37 mins                    G Codes:      [name] May 14, 2014, 12:02 PM Pager [phone]

## 2014-05-09 NOTE — Care Management Note (Addendum)
    Page 1 of 2   05/11/2014     3:14:51 PM CARE MANAGEMENT NOTE 05/11/2014  Patient:  [name], [name]   Account Number:  [id number]  Date Initiated:  05/09/2014  Documentation initiated by:  MAYO,HENRIETTA  Subjective/Objective Assessment:   dx resp failure; lives with dtr     Action/Plan:   Anticipated DC Date:     Anticipated DC Plan:        [place]  CM consult      Choice offered to / List presented to:  [name]        [place] arranged  HH-1 RN  [place] OT      [place].   Status of service:  Completed, signed off Medicare Important Message given?  YES (If response is "NO", the following Medicare IM given date fields will be blank) Date Medicare IM given:  05/09/2014 Medicare IM given by:  MAYO,HENRIETTA Date Additional Medicare IM given:  05/11/2014 Additional Medicare IM given by:  Lorayne Getchell GRAVES-BIGELOW  Discharge Disposition:  HOME/SELF CARE  Per UR Regulation:  Reviewed for med. necessity/level of care/duration of stay  If discussed at [place] of Stay Meetings, dates discussed:   05/12/2014    Comments:   05-11-14 [place], RN, BSN [phone] Plan for d/c home with [facility] services once medically stable.   05-10-14 1513 Jacqlyn Krauss, RN,BSN [phone] CM did offer choice to pt for [facility] services and pt refused [facility] services. CM did try to call son Truddie Crumble and no answer. Will try at a later time. CM was able to get in contact with Jeneen Rinks. Pt will benefit from [facility] RN for disease management and PT/OT services. Agreeable to services and referral made to [facility]. SOC to begin within 24-48 hrs post d/c. Pt will need orders for [facility] services listed above. No further needs at this time.   05/09/14 [place] MSN BSN CCM PT/OT recommend home therapy.  RN and NT readying pt for transfer to 3W.  CM attempted to discuss services and provide agency list to pt - pt smiled  and would not take list - appeared confused.  Will report to 3W CM.

## 2014-05-09 NOTE — Progress Notes (Signed)
[facility]  [name] XLK:[id number] DOB: Jul 19, 1928 DOA: 05/05/2014 PCP: Adella Hare, MD  Brief narrative: 80 y.o. female with history of CAD (status post stenting), hypertension, hyperlipidemia, renal artery stenosis, type II DM with nephropathy, chronic kidney disease stage III-IV, gout, chronic anemia, GERD and visually impaired who presented to the ED with SOB. She stated that she had been feeling poorly for about 24 hours which was associated with mild dyspnea.. She denied noncompliance with fluid restriction. She endorsed mild leg swelling. EMS was called to the home due to progression of symptoms and found her with oxygen saturation in the 50s in respiratory extremis.   On arrival to the ED, she was saturating at 99% with nonrebreather mask but had difficulty speaking short sentences. She received prolonged nebulization after arrival to the ER, with 40 mg IV Lasix x 2 and endorsed improvement in her breathing. In the ED, glucose 255, creatinine 1.82, WBC 18.2, hemoglobin 7.2, proBNP 2645 and chest x-ray compatible with CHF.  HPI/Subjective: Up in chair without complaints.  Denies cp or sob.  States she feels very weak.   Assessment/Plan:  Acute hypoxic respiratory failure with hypoxia:    A) Acute on chronic diastolic HF  -pt is now negative ~9 L - her weight is trending down - clinically she is much improved - Cardiology is following this issue as well and have transitioned to PO dosing of lasix    B) Community acquired pneumonia  -cont empiric abx to complete 7 days of tx - afebrile and WBC declining - f/u CXR 7/27 much improved - attempt to wean to RA  Normo/microcytic anemia -Hgb dipped as low as 6.8 - s/p 1U PRBC 7/25 w/ muted response (peak Hgb 7.5) - anemia panel was c/w Fe deficiency -transfused again 7/26 (unit #2) due to Hgb dropping again to 7.3 w/ goal to keep Hgb 8.0 or > given acute CHF and hx of CAD - stopped lovenox  until clear if pt actively bleeding - await stool guiacs - uncontrasted CT abdom without evidence RPH - will give IV iron - given advanced age, endoscopic eval may not be warranted unless anemia persists  DM -currently controlled with Lantus and SSI  Chronic kidney disease, stage IV  -crt had been steadily rising but improved after transfusion  Symptomatic bradycardia -overnight 7/23-7/24 was hypoxic and had bradycardia with rates down to 40's w/ altered menattion - improved after BiPAP applied - likely a primary respiratory event - no recurrence since that one event   Hyperkalemia -given Kayexalate > resolved   HYPERTENSION -controlled  CAD, NATIVE VESSEL  DYSLIPIDEMIA  DVT prophylaxis: SCDs only due to anemia  Code Status: Full Family Communication: No family at bedside Disposition Plan/Expected LOS: Transfer to telemetry - PT/OT  Consultants: Cardiology  Procedures: None  Antibiotics: Zithromax 7/23 >>> Rocephin 7/23 >>>  Objective: Blood pressure 140/52, pulse 63, temperature 97.9 F (36.6 C), temperature source Oral, resp. rate 19, height 5\' 3"  (1.6 m), weight 188 lb 4.4 oz (85.4 kg), SpO2 98.00%.  Intake/Output Summary (Last 24 hours) at 05/09/14 1207 Last data filed at 05/09/14 1030  Gross per 24 hour  Intake  947.5 ml  Output   3250 ml  Net -2302.5 ml   Exam: General: No acute respiratory distress - on [place] oxygen Lungs: Improved air movement all fields - no focal crackles or wheezes Cardiovascular: Regular rate and rhythm without gallop or rub, trace peripheral edema  Abdomen: Nontender, nondistended, soft, bowel sounds positive, no rebound, no ascites, no appreciable mass  Musculoskeletal: No significant cyanosis, clubbing of bilateral lower extremities  Scheduled Meds:  Scheduled Meds: . allopurinol  200 mg Oral Daily  . amLODipine  5 mg Oral Daily  . aspirin  81 mg Oral Daily  . atorvastatin  20 mg Oral q1800  . atropine  1 drop Left Eye BID  .  [START ON 05/10/2014] azithromycin  500 mg Oral Daily  . cefTRIAXone (ROCEPHIN)  IV  1 g Intravenous Q24H  . citalopram  20 mg Oral Daily  . dorzolamide-timolol  1 drop Both Eyes BID  . furosemide  80 mg Oral Daily  . gabapentin  300 mg Oral BID  . hydrALAZINE  100 mg Oral TID  . insulin aspart  0-5 Units Subcutaneous QHS  . insulin aspart  0-9 Units Subcutaneous TID WC  . insulin aspart  3 Units Subcutaneous TID WC  . insulin glargine  5 Units Subcutaneous QHS  . iron dextran (INFED/DEXFERRUM) IVPB (TEST DOSE)  25 mg Intravenous Once  . iron dextran (INFED/DEXFERRRUM) 500 MG IVPB  500 mg Intravenous Daily  . latanoprost  1 drop Left Eye QHS  . pantoprazole  40 mg Oral Daily  . sodium chloride  3 mL Intravenous Q12H   Data Reviewed: Basic Metabolic Panel:  Recent Labs Lab 05/06/14 0150 05/06/14 0242 05/06/14 1233 05/07/14 0319 05/08/14 0258 05/09/14 0333  NA 141 142  --  143 142 141  K 5.5* 5.6* 4.6 4.1 3.7 3.8  CL 103 104  --  101 99 96  CO2 20 25  --  27 28 29   GLUCOSE 179* 150*  --  125* 130* 152*  BUN 31* 31*  --  36* 47* 50*  CREATININE 2.06* 2.14*  --  2.20* 2.39* 1.98*  CALCIUM 8.9 8.9  --  9.0 8.9 9.4   Liver Function Tests:  Recent Labs Lab 05/05/14 0919 05/07/14 0319 05/09/14 0333  AST 15 13 12   ALT 8 <5 6  ALKPHOS 138* 106 96  BILITOT 0.3 0.3 0.3  PROT 7.5 6.6 7.0  ALBUMIN 3.6 2.9* 2.9*   CBC:  Recent Labs Lab 05/05/14 0919  05/07/14 0319 05/07/14 1847 05/08/14 0258 05/08/14 1819 05/09/14 0333  WBC 18.2*  < > 9.9 9.2 9.6 10.4 10.3  NEUTROABS 16.3*  --   --   --   --   --   --   HGB 7.2*  < > 6.8* 7.5* 7.3* 9.0* 9.2*  HCT 24.5*  < > 22.6* 24.0* 23.8* 28.9* 29.1*  MCV 79.8  < > 80.4 79.2 79.1 80.3 80.2  PLT 217  < > 173 201 193 193 212  < > = values in this interval not displayed.  Cardiac Enzymes:  Recent Labs Lab 05/05/14 1705 05/05/14 2033 05/06/14 0242  TROPONINI <0.30 <0.30 0.42*   BNP (last 3 results)  Recent Labs   11/11/13 0655 11/14/13 0406 05/05/14 0919  PROBNP 1922.0* 1073.0* 2645.0*   CBG:  Recent Labs Lab 05/08/14 0742 05/08/14 1210 05/08/14 1726 05/08/14 2132 05/09/14 0912  GLUCAP 158* 126* 195* 153* 200*    Recent Results (from the past 240 hour(s))  URINE CULTURE     Status: None   Collection Time    05/05/14 10:18 AM      Result Value Ref Range Status   Specimen Description URINE, CATHETERIZED   Final   Special Requests NONE   Final   Culture  Setup Time     Final   Value: 05/05/2014 18:35     Performed at [place]     Final   Value: NO GROWTH     Performed at [facility]   Culture     Final   Value: NO GROWTH     Performed at [facility]   Report Status 05/06/2014 FINAL   Final  MRSA PCR SCREENING     Status: None   Collection Time    05/05/14  2:40 PM      Result Value Ref Range Status   MRSA by PCR NEGATIVE  NEGATIVE Final   Comment:            The GeneXpert MRSA Assay (FDA     approved for NASAL specimens     only), is one component of a     comprehensive MRSA colonization     surveillance program. It is not     intended to diagnose MRSA     infection nor to guide or     monitor treatment for     MRSA infections.  CULTURE, BLOOD (ROUTINE X 2)     Status: None   Collection Time    05/06/14  7:20 AM      Result Value Ref Range Status   Specimen Description BLOOD RIGHT HAND   Final   Special Requests BOTTLES DRAWN AEROBIC ONLY 3CC   Final   Culture  Setup Time     Final   Value: 05/06/2014 12:53     Performed at [facility]   Culture     Final   Value:        BLOOD CULTURE RECEIVED NO GROWTH TO DATE CULTURE WILL BE HELD FOR 5 DAYS BEFORE ISSUING A FINAL NEGATIVE REPORT     Performed at [facility]   Report Status PENDING   Incomplete  CULTURE, BLOOD (ROUTINE X 2)     Status: None   Collection Time    05/06/14  7:40 AM      Result Value Ref Range Status   Specimen Description BLOOD RIGHT  HAND   Final   Special Requests BOTTLES DRAWN AEROBIC ONLY 2CC   Final   Culture  Setup Time     Final   Value: 05/06/2014 12:53     Performed at [facility]   Culture     Final   Value:        BLOOD CULTURE RECEIVED NO GROWTH TO DATE CULTURE WILL BE HELD FOR 5 DAYS BEFORE ISSUING A FINAL NEGATIVE REPORT     Performed at [facility]   Report Status PENDING   Incomplete     Studies:  Recent x-ray studies have been reviewed in detail by the Attending Physician  Time spent :  35 mins  Erin Hearing, ANP [facility] For Consults/Admissions - Flow Manager - [phone] Office  [phone] Pager [phone]  On-Call/Text Page:      [email or web address]      password [facility]   05/09/2014, 12:07 PM   LOS: 4 days   I have personally examined this patient and reviewed the entire database. I have reviewed the above note, made any necessary editorial changes, and agree with its content.  Cherene Altes, MD [facility]

## 2014-05-10 DIAGNOSIS — D649 Anemia, unspecified: Secondary | ICD-10-CM

## 2014-05-10 LAB — GLUCOSE, CAPILLARY
GLUCOSE-CAPILLARY: 234 mg/dL — AB (ref 70–99)
GLUCOSE-CAPILLARY: 148 mg/dL — AB (ref 70–99)
Glucose-Capillary: 218 mg/dL — ABNORMAL HIGH (ref 70–99)
Glucose-Capillary: 143 mg/dL — ABNORMAL HIGH (ref 70–99)
Glucose-Capillary: 143 mg/dL — ABNORMAL HIGH (ref 70–99)

## 2014-05-10 LAB — CBC
HEMATOCRIT: 29.8 % — AB (ref 36.0–46.0)
HEMOGLOBIN: 9.3 g/dL — AB (ref 12.0–15.0)
MCH: 25 pg — ABNORMAL LOW (ref 26.0–34.0)
MCHC: 31.2 g/dL (ref 30.0–36.0)
MCV: 80.1 fL (ref 78.0–100.0)
Platelets: 190 10*3/uL (ref 150–400)
RBC: 3.72 MIL/uL — AB (ref 3.87–5.11)
RDW: 18.5 % — ABNORMAL HIGH (ref 11.5–15.5)
WBC: 8 10*3/uL (ref 4.0–10.5)

## 2014-05-10 LAB — BASIC METABOLIC PANEL
ANION GAP: 13 (ref 5–15)
BUN: 51 mg/dL — AB (ref 6–23)
CHLORIDE: 99 meq/L (ref 96–112)
CO2: 29 mEq/L (ref 19–32)
Calcium: 9.7 mg/dL (ref 8.4–10.5)
Creatinine, Ser: 1.8 mg/dL — ABNORMAL HIGH (ref 0.50–1.10)
GFR calc non Af Amer: 25 mL/min — ABNORMAL LOW (ref >90)
GFR, EST AFRICAN AMERICAN: 29 mL/min — AB (ref >90)
Glucose, Bld: 131 mg/dL — ABNORMAL HIGH (ref 70–99)
POTASSIUM: 3.5 meq/L — AB (ref 3.7–5.3)
Sodium: 141 mEq/L (ref 137–147)

## 2014-05-10 NOTE — Progress Notes (Addendum)
Progress Note  [name] TIR:[id number] DOB: 03-21-1928 DOA: 05/05/2014 PCP: Adella Hare, MD -has retired. Patient apparently has a new PCP but does not know the name.  Outpatient Specialists:  1. Cardiology: Dr. Lauree Chandler 2. Endocrinology: Dr. Elayne Snare.  Brief narrative: 80 y.o. female with history of CAD (status post stenting), hypertension, hyperlipidemia, renal artery stenosis, type II DM with nephropathy, chronic kidney disease stage III-IV, gout, chronic anemia, GERD and visually impaired who presented to the ED with SOB. She stated that she had been feeling poorly for about 24 hours which was associated with mild dyspnea.. She denied compliance with fluid restriction. She endorsed mild leg swelling. EMS was called to the home due to progression of symptoms and found her with oxygen saturation in the 50s with respiratory extremis.   On arrival to the ED, she was saturating at 99% with nonrebreather mask but had difficulty speaking short sentences. She received prolonged nebulization after arrival to the ER, with 40 mg IV Lasix x 2 and endorsed improvement in her breathing. In the ED, glucose 255, creatinine 1.82, WBC 18.2, hemoglobin 7.2, proBNP 2645 and chest x-ray compatible with CHF.  HPI/Subjective: Denies cp or sob. Still feels weak overall.  Assessment/Plan:  Acute hypoxic respiratory failure with hypoxia:    A) Acute on chronic diastolic HF  -pt is now negative ~9 L - her weight is trending down - clinically she is much improved - Cardiology is following this issue as well and have transitioned to PO dosing of lasix    B) Community acquired pneumonia  -cont empiric abx to complete 7 days of tx - afebrile and WBC declining - f/u CXR 7/27 much improved - saturating in the low 90s on room air. Will complete 7 days antibiotic treatment tomorrow. Recommend followup chest x-ray in 4-6 weeks to ensure resolution of pneumonia findings. Blood cultures x2:  Negative to date.  Normo/microcytic anemia -Hgb dipped as low as 6.8 - s/p 1U PRBC 7/25 w/ muted response (peak Hgb 7.5) - anemia panel was c/w Fe deficiency -transfused again 7/26 (unit #2) due to Hgb dropping again to 7.3 w/ goal to keep Hgb 8.0 or > given acute CHF and hx of CAD - stopped lovenox until clear if pt actively bleeding - await stool guiacs - uncontrasted CT abdom without evidence RPH - will give IV iron - given advanced age, endoscopic eval may not be warranted unless anemia persists. Hemoglobin stable over the last 3 days.  DM -currently on Lantus and SSI. Fluctuating and mildly uncontrolled.  Chronic kidney disease, stage IV  -crt had been steadily rising but improved after transfusion but have gradually decreased and probably at baseline now-1.8.  Symptomatic bradycardia -overnight 7/23-7/24 was hypoxic and had bradycardia with rates down to 40's w/ altered menattion - improved after BiPAP applied - likely a primary respiratory event - no recurrence since that one event . Sinus rhythm on telemetry. And  Hyperkalemia -given Kayexalate > resolved   HYPERTENSION -controlled  CAD, NATIVE VESSEL  DYSLIPIDEMIA  DVT prophylaxis: SCDs only due to anemia  Code Status: Full Family Communication: No family at bedside Disposition Plan/Expected LOS: Possible discharge in the next 24-48 hours. Social worker to evaluate safe home situation for discharge.  Consultants: Cardiology  Procedures: None  Antibiotics: Zithromax 7/23 >>> Rocephin 7/23 >>> 7/28  Objective: Blood pressure 117/67, pulse 83, temperature 98.1 F (36.7 C), temperature source Oral, resp. rate 18, height 5\' 3"  (1.6 m), weight 85.548 kg (188  lb 9.6 oz), SpO2 91.00%.  Intake/Output Summary (Last 24 hours) at 05/10/14 1416 Last data filed at 05/09/14 2353  Gross per 24 hour  Intake    240 ml  Output    300 ml  Net    -60 ml   Exam: General: No acute respiratory distress - on room air Lungs:  Improved air movement all fields - no focal crackles or wheezes Cardiovascular: Regular rate and rhythm without gallop or rub, trace peripheral edema. Tele: SR  Abdomen: Nontender, nondistended, soft, bowel sounds positive, no rebound, no ascites, no appreciable mass  Musculoskeletal: No significant cyanosis, clubbing of bilateral lower extremities CNS: alert and oriented. No focal deficits.  Scheduled Meds:  Scheduled Meds: . allopurinol  200 mg Oral Daily  . amLODipine  5 mg Oral Daily  . aspirin  81 mg Oral Daily  . atorvastatin  20 mg Oral q1800  . atropine  1 drop Left Eye BID  . azithromycin  500 mg Oral Daily  . cefTRIAXone (ROCEPHIN)  IV  1 g Intravenous Q24H  . citalopram  20 mg Oral Daily  . dorzolamide-timolol  1 drop Both Eyes BID  . furosemide  80 mg Oral Daily  . gabapentin  300 mg Oral BID  . hydrALAZINE  100 mg Oral TID  . insulin aspart  0-5 Units Subcutaneous QHS  . insulin aspart  0-9 Units Subcutaneous TID WC  . insulin aspart  3 Units Subcutaneous TID WC  . insulin glargine  5 Units Subcutaneous QHS  . latanoprost  1 drop Left Eye QHS  . pantoprazole  40 mg Oral Daily  . sodium chloride  3 mL Intravenous Q12H   Data Reviewed: Basic Metabolic Panel:  Recent Labs Lab 05/06/14 0242 05/06/14 1233 05/07/14 0319 05/08/14 0258 05/09/14 0333 05/10/14 0345  NA 142  --  143 142 141 141  K 5.6* 4.6 4.1 3.7 3.8 3.5*  CL 104  --  101 99 96 99  CO2 25  --  27 28 29 29   GLUCOSE 150*  --  125* 130* 152* 131*  BUN 31*  --  36* 47* 50* 51*  CREATININE 2.14*  --  2.20* 2.39* 1.98* 1.80*  CALCIUM 8.9  --  9.0 8.9 9.4 9.7   Liver Function Tests:  Recent Labs Lab 05/05/14 0919 05/07/14 0319 05/09/14 0333  AST 15 13 12   ALT 8 <5 6  ALKPHOS 138* 106 96  BILITOT 0.3 0.3 0.3  PROT 7.5 6.6 7.0  ALBUMIN 3.6 2.9* 2.9*   CBC:  Recent Labs Lab 05/05/14 0919  05/07/14 1847 05/08/14 0258 05/08/14 1819 05/09/14 0333 05/10/14 0345  WBC 18.2*  < > 9.2 9.6 10.4  10.3 8.0  NEUTROABS 16.3*  --   --   --   --   --   --   HGB 7.2*  < > 7.5* 7.3* 9.0* 9.2* 9.3*  HCT 24.5*  < > 24.0* 23.8* 28.9* 29.1* 29.8*  MCV 79.8  < > 79.2 79.1 80.3 80.2 80.1  PLT 217  < > 201 193 193 212 190  < > = values in this interval not displayed.  Cardiac Enzymes:  Recent Labs Lab 05/05/14 1705 05/05/14 2033 05/06/14 0242  TROPONINI <0.30 <0.30 0.42*   BNP (last 3 results)  Recent Labs  11/11/13 0655 11/14/13 0406 05/05/14 0919  PROBNP 1922.0* 1073.0* 2645.0*   CBG:  Recent Labs Lab 05/09/14 1302 05/09/14 1657 05/09/14 2130 05/10/14 0735 05/10/14 1144  GLUCAP 149* 172*  208* 218* 143*    Recent Results (from the past 240 hour(s))  URINE CULTURE     Status: None   Collection Time    05/05/14 10:18 AM      Result Value Ref Range Status   Specimen Description URINE, CATHETERIZED   Final   Special Requests NONE   Final   Culture  Setup Time     Final   Value: 05/05/2014 18:35     Performed at [facility]     Final   Value: NO GROWTH     Performed at [facility]   Culture     Final   Value: NO GROWTH     Performed at [facility]   Report Status 05/06/2014 FINAL   Final  MRSA PCR SCREENING     Status: None   Collection Time    05/05/14  2:40 PM      Result Value Ref Range Status   MRSA by PCR NEGATIVE  NEGATIVE Final   Comment:            The GeneXpert MRSA Assay (FDA     approved for NASAL specimens     only), is one component of a     comprehensive MRSA colonization     surveillance program. It is not     intended to diagnose MRSA     infection nor to guide or     monitor treatment for     MRSA infections.  CULTURE, BLOOD (ROUTINE X 2)     Status: None   Collection Time    05/06/14  7:20 AM      Result Value Ref Range Status   Specimen Description BLOOD RIGHT HAND   Final   Special Requests BOTTLES DRAWN AEROBIC ONLY 3CC   Final   Culture  Setup Time     Final   Value: 05/06/2014 12:53      Performed at [facility]   Culture     Final   Value:        BLOOD CULTURE RECEIVED NO GROWTH TO DATE CULTURE WILL BE HELD FOR 5 DAYS BEFORE ISSUING A FINAL NEGATIVE REPORT     Performed at [facility]   Report Status PENDING   Incomplete  CULTURE, BLOOD (ROUTINE X 2)     Status: None   Collection Time    05/06/14  7:40 AM      Result Value Ref Range Status   Specimen Description BLOOD RIGHT HAND   Final   Special Requests BOTTLES DRAWN AEROBIC ONLY 2CC   Final   Culture  Setup Time     Final   Value: 05/06/2014 12:53     Performed at [facility]   Culture     Final   Value:        BLOOD CULTURE RECEIVED NO GROWTH TO DATE CULTURE WILL BE HELD FOR 5 DAYS BEFORE ISSUING A FINAL NEGATIVE REPORT     Performed at [facility]   Report Status PENDING   Incomplete     Studies:  Recent x-ray studies have been reviewed in detail by the Attending Physician  Time spent :  35 mins   LOS: 5 days   Emylie Amster, MD, FACP, [place]. [facility] Pager [phone]  If 7PM-7AM, please contact night-coverage [email or web address] Password [facility] 05/10/2014, 2:27 PM

## 2014-05-10 NOTE — Progress Notes (Signed)
Physical Therapy Treatment Patient Details Name: [name] MRN: [id number] DOB: April 18, 1928 Today's Date: 05/10/2014    History of Present Illness Pt is 80 y/o female with history of CAD (status post stenting), hypertension, hyperlipidemia, renal artery stenosis, type II DM with nephropathy, chronic kidney disease stage III-4, gout, chronic anemia, GERD and visually impaired. Presented to the ED with SOB. EMS was called to the home due to progression of symptoms and found her with oxygen saturation in the 50s.       PT Comments    Pt very pleasant and moving well today. Pt with limited mobility at home per pt and grandson who state 83' is about normal for her basic function and pt encouraged to increase mobility at home. Pt states she performs HEP daily at home and recommended continuation of this as well. Will continue to follow acutely.   Follow Up Recommendations  Home health PT;Supervision/Assistance - 24 hour     Equipment Recommendations       Recommendations for Other Services       Precautions / Restrictions Precautions Precautions: Fall Precaution Comments: limited vision    Mobility  Bed Mobility               General bed mobility comments: in recliner on arrival  Transfers Overall transfer level: Needs assistance   Transfers: Sit to/from Stand Sit to Stand: Min assist         General transfer comment: Pt requires cues for hand placement.  Assist to lift bottom from chair x 1  Ambulation/Gait Ambulation/Gait assistance: Min assist Ambulation Distance (Feet): 160 Feet Assistive device: Rolling walker (2 wheeled) Gait Pattern/deviations: Step-through pattern;Decreased stride length;Trunk flexed;Drifts right/left   Gait velocity interpretation: Below normal speed for age/gender General Gait Details: Pt with low vision and required assist to maneuver RW during turns (and to prevent drifting to her Left)   Stairs            Wheelchair  Mobility    Modified Rankin (Stroke Patients Only)       Balance Overall balance assessment: Needs assistance           Standing balance-Leahy Scale: Fair                      Cognition Arousal/Alertness: Awake/alert Behavior During Therapy: WFL for tasks assessed/performed Overall Cognitive Status: Within Functional Limits for tasks assessed                      Exercises General Exercises - Lower Extremity Long Arc Quad: AROM;Seated;Both;15 reps Hip Flexion/Marching: AROM;Seated;Both;15 reps    General Comments        Pertinent Vitals/Pain No pain HR 103 with activity    Home Living                      Prior Function            PT Goals (current goals can now be found in the care plan section) Progress towards PT goals: Progressing toward goals    Frequency       PT Plan Current plan remains appropriate    Co-evaluation             End of Session   Activity Tolerance: Patient tolerated treatment well Patient left: in chair;with call bell/phone within reach;with family/visitor present     Time: 6301-6010 PT Time Calculation (min): 24 min  Charges:  $Gait Training: 8-22  mins $Therapeutic Activity: 8-22 mins                    G Codes:      Melford Aase May 31, 2014, 1:00 PM Elwyn Reach, [place]

## 2014-05-10 NOTE — Progress Notes (Signed)
Occupational Therapy Treatment Patient Details Name: [name] MRN: [id number] DOB: 1928-04-02 Today's Date: 05/10/2014    History of present illness Pt is 80 y/o female with history of CAD (status post stenting), hypertension, hyperlipidemia, renal artery stenosis, type II DM with nephropathy, chronic kidney disease stage III-4, gout, chronic anemia, GERD and visually impaired. Presented to the ED with SOB. EMS was called to the home due to progression of symptoms and found her with oxygen saturation in the 50s.      OT comments  Pt making progress, OT will continue to follow  Follow Up Recommendations  Home health OT;Supervision/Assistance - 24 hour    Equipment Recommendations  None recommended by OT    Recommendations for Other Services      Precautions / Restrictions Precautions Precautions: Fall Precaution Comments: limited vision       Mobility Bed Mobility               General bed mobility comments: in recliner on arrival  Transfers Overall transfer level: Needs assistance Equipment used: 1 person hand held assist;Rolling walker (2 wheeled) Transfers: Sit to/from Stand Sit to Stand: Min assist         General transfer comment: Pt requires cues for hand placement.  Using RW more of a hindrance    Balance Overall balance assessment: Needs assistance Sitting-balance support: No upper extremity supported;Feet supported Sitting balance-Leahy Scale: Good     Standing balance support: Bilateral upper extremity supported;Single extremity supported;During functional activity Standing balance-Leahy Scale: Fair                     ADL       Grooming: Wash/dry hands;Wash/dry face;Oral care;Standing;Min guard   Upper Body Bathing: Set up;Sitting   Lower Body Bathing: Sit to/from stand;Min guard   Upper Body Dressing : Sitting;Set up;Supervision/safety   Lower Body Dressing: Sit to/from stand;Minimal assistance   Toilet Transfer: Comfort  height toilet;Grab bars;Minimal assistance   Toileting- Clothing Manipulation and Hygiene: Min guard;Sit to/from stand       Functional mobility during ADLs: Rolling walker;Minimal assistance        Vision  legally blind                               Cognition   Behavior During Therapy: [facility] for tasks assessed/performed Overall Cognitive Status: Within Functional Limits for tasks assessed                                                 General Comments  Pt very pleasant and cooperative, highly motivated    Pertinent Vitals/ Pain       No c/o pain, VSS                                                          Frequency Min 2X/week     Progress Toward Goals  OT Goals(current goals can now be found in the care plan section)  Progress towards OT goals: Progressing toward goals  Acute Rehab OT Goals Patient Stated Goal: to go home  Plan Discharge plan remains  appropriate                     End of Session Equipment Utilized During Treatment: Rolling walker   Activity Tolerance Patient tolerated treatment well   Patient Left in chair;with call bell/phone within reach;with nursing/sitter in room   Nurse Communication          Time: 1624-4695 OT Time Calculation (min): 25 min  Charges: OT General Charges $OT Visit: 1 Procedure OT Treatments $Self Care/Home Management : 8-22 mins $Therapeutic Activity: 8-22 mins  Britt Bottom 05/10/2014, 2:02 PM

## 2014-05-11 LAB — BASIC METABOLIC PANEL
Anion gap: 19 — ABNORMAL HIGH (ref 5–15)
BUN: 47 mg/dL — ABNORMAL HIGH (ref 6–23)
CO2: 26 meq/L (ref 19–32)
CREATININE: 1.77 mg/dL — AB (ref 0.50–1.10)
Calcium: 9.7 mg/dL (ref 8.4–10.5)
Chloride: 98 mEq/L (ref 96–112)
GFR calc non Af Amer: 25 mL/min — ABNORMAL LOW (ref >90)
GFR, EST AFRICAN AMERICAN: 29 mL/min — AB (ref >90)
Glucose, Bld: 114 mg/dL — ABNORMAL HIGH (ref 70–99)
POTASSIUM: 3.3 meq/L — AB (ref 3.7–5.3)
SODIUM: 143 meq/L (ref 137–147)

## 2014-05-11 LAB — GLUCOSE, CAPILLARY
GLUCOSE-CAPILLARY: 199 mg/dL — AB (ref 70–99)
Glucose-Capillary: 180 mg/dL — ABNORMAL HIGH (ref 70–99)
Glucose-Capillary: 175 mg/dL — ABNORMAL HIGH (ref 70–99)

## 2014-05-11 MED ORDER — POTASSIUM CHLORIDE CRYS ER 20 MEQ PO TBCR
40.0000 meq | EXTENDED_RELEASE_TABLET | Freq: Once | ORAL | Status: AC
Start: 2014-05-11 — End: 2014-05-11
  Administered 2014-05-11: 40 meq via ORAL

## 2014-05-11 NOTE — Progress Notes (Signed)
Patient ID: [name]  female  EGB:[id number]    DOB: Apr 23, 1928    DOA: 05/05/2014  PCP: Adella Hare, MD  Assessment/Plan: Principal Problem: Acute hypoxic respiratory failure with hypoxia:  A) Acute on chronic diastolic HF  - Improving, negative output 9.1 L, weight down to 187lbs, - Cardiology following, transitioned to oral Lasix - Creatinine function stable   B) Community acquired pneumonia  -Continue the Zithromax and Rocephin for total 7 days  - Followup chest x-ray 7/27 much improved - Recommend followup chest x-ray in 4-6 weeks to ensure resolution of pneumonia findings. -  Blood cultures x2: Negative to date.   Normo/microcytic anemia  - Hemoglobin stable over the last 3 days.   DM  -currently on Lantus and SSI. Fluctuating and mildly uncontrolled.   Chronic kidney disease, stage IV  -crt had been steadily rising but improved after transfusion but have gradually decreased and probably at baseline now-1.8.   Symptomatic bradycardia  - overnight 7/23-7/24 was hypoxic and had bradycardia with rates down to 40's w/ altered menattion - improved after BiPAP applied - likely a primary respiratory event - no recurrence since that one event . Sinus rhythm on telemetry. And   Hyperkalemia  -given Kayexalate > resolved now hypokalemia, will replace  HYPERTENSION  -controlled   CAD, NATIVE VESSEL  DYSLIPIDEMIA   DVT Prophylaxis:  Code Status:  Family Communication:  Disposition: Hopefully DC tomorrow if stable  Consultants:  Cardiology  Procedures:  None  Antibiotics:  IV Rocephin, Zithromax    Subjective: Patient seen and examined, feeling better, eating breakfast, shortness of breath improving  Objective: Weight change: -0.548 kg (-1 lb 3.3 oz)  Intake/Output Summary (Last 24 hours) at 05/11/14 1229 Last data filed at 05/10/14 2255  Gross per 24 hour  Intake    120 ml  Output      0 ml  Net    120 ml   Blood pressure 149/58, pulse  85, temperature 98.6 F (37 C), temperature source Oral, resp. rate 18, height 5\' 3"  (1.6 m), weight 85 kg (187 lb 6.3 oz), SpO2 92.00%.  Physical Exam: General: Alert and awake, oriented x3, not in any acute distress. CVS: S1-S2 clear, no murmur rubs or gallops Chest: Crackles at the bases, no wheezing, Abdomen: soft nontender, nondistended, normal bowel sounds  Extremities: no cyanosis, clubbing, trace edema noted bilaterally   Lab Results: Basic Metabolic Panel:  Recent Labs Lab 05/10/14 0345 05/11/14 0530  NA 141 143  K 3.5* 3.3*  CL 99 98  CO2 29 26  GLUCOSE 131* 114*  BUN 51* 47*  CREATININE 1.80* 1.77*  CALCIUM 9.7 9.7   Liver Function Tests:  Recent Labs Lab 05/07/14 0319 05/09/14 0333  AST 13 12  ALT <5 6  ALKPHOS 106 96  BILITOT 0.3 0.3  PROT 6.6 7.0  ALBUMIN 2.9* 2.9*   No results found for this basename: LIPASE, AMYLASE,  in the last 168 hours No results found for this basename: AMMONIA,  in the last 168 hours CBC:  Recent Labs Lab 05/05/14 0919  05/09/14 0333 05/10/14 0345  WBC 18.2*  < > 10.3 8.0  NEUTROABS 16.3*  --   --   --   HGB 7.2*  < > 9.2* 9.3*  HCT 24.5*  < > 29.1* 29.8*  MCV 79.8  < > 80.2 80.1  PLT 217  < > 212 190  < > = values in this interval not displayed. Cardiac Enzymes:  Recent Labs  Lab 05/05/14 1705 05/05/14 2033 05/06/14 0242  TROPONINI <0.30 <0.30 0.42*   BNP: No components found with this basename: POCBNP,  CBG:  Recent Labs Lab 05/10/14 1144 05/10/14 1624 05/10/14 2019 05/10/14 2230 05/11/14 1133  GLUCAP 143* 143* 234* 148* 199*     Micro Results: Recent Results (from the past 240 hour(s))  URINE CULTURE     Status: None   Collection Time    05/05/14 10:18 AM      Result Value Ref Range Status   Specimen Description URINE, CATHETERIZED   Final   Special Requests NONE   Final   Culture  Setup Time     Final   Value: 05/05/2014 18:35     Performed at [place]      Final   Value: NO GROWTH     Performed at [facility]   Culture     Final   Value: NO GROWTH     Performed at [facility]   Report Status 05/06/2014 FINAL   Final  MRSA PCR SCREENING     Status: None   Collection Time    05/05/14  2:40 PM      Result Value Ref Range Status   MRSA by PCR NEGATIVE  NEGATIVE Final   Comment:            The GeneXpert MRSA Assay (FDA     approved for NASAL specimens     only), is one component of a     comprehensive MRSA colonization     surveillance program. It is not     intended to diagnose MRSA     infection nor to guide or     monitor treatment for     MRSA infections.  CULTURE, BLOOD (ROUTINE X 2)     Status: None   Collection Time    05/06/14  7:20 AM      Result Value Ref Range Status   Specimen Description BLOOD RIGHT HAND   Final   Special Requests BOTTLES DRAWN AEROBIC ONLY 3CC   Final   Culture  Setup Time     Final   Value: 05/06/2014 12:53     Performed at [facility]   Culture     Final   Value:        BLOOD CULTURE RECEIVED NO GROWTH TO DATE CULTURE WILL BE HELD FOR 5 DAYS BEFORE ISSUING A FINAL NEGATIVE REPORT     Performed at [facility]   Report Status PENDING   Incomplete  CULTURE, BLOOD (ROUTINE X 2)     Status: None   Collection Time    05/06/14  7:40 AM      Result Value Ref Range Status   Specimen Description BLOOD RIGHT HAND   Final   Special Requests BOTTLES DRAWN AEROBIC ONLY 2CC   Final   Culture  Setup Time     Final   Value: 05/06/2014 12:53     Performed at [facility]   Culture     Final   Value:        BLOOD CULTURE RECEIVED NO GROWTH TO DATE CULTURE WILL BE HELD FOR 5 DAYS BEFORE ISSUING A FINAL NEGATIVE REPORT     Performed at [facility]   Report Status PENDING   Incomplete    Studies/Results: Ct Abdomen Pelvis Wo Contrast  05/08/2014   CLINICAL DATA:  80 year old female with abdominal and pelvic pain.  Evaluate for retroperitoneal hematoma.  EXAM:  CT ABDOMEN AND PELVIS WITHOUT CONTRAST  TECHNIQUE: Multidetector CT imaging of the abdomen and pelvis was performed following the standard protocol without IV contrast.  COMPARISON:  02/24/2013 CT  FINDINGS: Cardiomegaly, small bilateral pleural effusions and bibasilar atelectasis noted.  The liver, spleen, adrenal glands and pancreas are unremarkable.  Bilateral renal atrophy identified.  Patient is status post cholecystectomy.  There is no evidence of free fluid, enlarged lymph nodes, biliary dilation or abdominal aortic aneurysm.  Colonic diverticulosis noted without diverticulitis.  There is no evidence of bowel obstruction, pneumoperitoneum or focal collection.  There is no evidence of abdominal, pelvic or retroperitoneal hematoma.  A calcified fibroid is noted.  Moderate atherosclerotic vascular and abdominal aortic calcifications noted.  A Foley catheter within the bladder is present.  No acute or suspicious bony abnormalities are identified.  IMPRESSION: No evidence of abdominal, pelvic or retroperitoneal hematoma.  Small bilateral pleural effusions with bibasilar atelectasis.  Colonic diverticulosis without diverticulitis.  Cardiomegaly and abdominal aortic atherosclerotic disease.   Electronically Signed   By: Hassan Rowan M.D.   On: 05/08/2014 19:39   Dg Chest Port 1 View  05/09/2014   CLINICAL DATA:  Follow up infiltrate.  Respiratory distress.  EXAM: PORTABLE CHEST - 1 VIEW  COMPARISON:  05/06/2014.  FINDINGS: Mediastinum and hilar structures are normal. Cardiomegaly. Improving pulmonary vascular prominence. Interim near complete resolution of bilateral infiltrates. Small pleural effusion. These findings suggest improving congestive heart failure. No pneumothorax. No acute osseus abnormality.  IMPRESSION: Improving congestive heart failure with interval near complete resolution of pulmonary edema.   Electronically Signed   By: [place]   On: 05/09/2014 07:14   Dg Chest Port 1 View  05/06/2014    CLINICAL DATA:  Respiratory distress.  EXAM: PORTABLE CHEST - 1 VIEW  COMPARISON:  Chest radiograph performed 05/05/2014  FINDINGS: The lungs are well-aerated. There is persistent bilateral airspace opacification, perhaps slightly improved from the prior study. This may reflect multifocal pneumonia or pulmonary edema. Small bilateral pleural effusions are suspected. No pneumothorax is seen.  The cardiomediastinal silhouette is mildly enlarged. No acute osseous abnormalities are seen. An external pacing pad is noted overlying the right hemithorax.  IMPRESSION: 1. Persistent bilateral airspace opacification, perhaps slightly improved from the prior study. This may reflect multifocal pneumonia or pulmonary edema. Suspect small bilateral pleural effusions. 2. Mild cardiomegaly.   Electronically Signed   By: Garald Balding M.D.   On: 05/06/2014 02:18   Dg Chest Port 1 View  05/05/2014   CLINICAL DATA:  Generalized weakness and nausea.  EXAM: PORTABLE CHEST - 1 VIEW  COMPARISON:  11/13/2013.  FINDINGS: Support apparatus: Monitoring leads project over the chest.  Cardiomediastinal Silhouette: Partially obscured. Cardiopericardial silhouette does appear enlarged.  Lungs: Diffuse bilateral RIGHT-greater-than-LEFT airspace disease, with a perihilar and basilar predominant distribution. Consolidation at both lung bases, greater on the RIGHT than LEFT. No pneumothorax.  Effusions:  Probable small RIGHT pleural effusion.  Other:  None.  IMPRESSION: Constellation of findings most compatible with moderate CHF. Airspace disease is favored to represent asymmetric/atypical pulmonary edema. Superimposed pneumonia is less unlikely but cannot be excluded.   Electronically Signed   By: Dereck Ligas M.D.   On: 05/05/2014 09:29    Medications: Scheduled Meds: . allopurinol  200 mg Oral Daily  . amLODipine  5 mg Oral Daily  . aspirin  81 mg Oral Daily  . atorvastatin  20 mg Oral q1800  . atropine  1 drop Left Eye BID  .  azithromycin  500 mg Oral Daily  . cefTRIAXone (ROCEPHIN)  IV  1 g Intravenous Q24H  . citalopram  20 mg Oral Daily  . dorzolamide-timolol  1 drop Both Eyes BID  . furosemide  80 mg Oral Daily  . gabapentin  300 mg Oral BID  . hydrALAZINE  100 mg Oral TID  . insulin aspart  0-5 Units Subcutaneous QHS  . insulin aspart  0-9 Units Subcutaneous TID WC  . insulin aspart  3 Units Subcutaneous TID WC  . insulin glargine  5 Units Subcutaneous QHS  . latanoprost  1 drop Left Eye QHS  . pantoprazole  40 mg Oral Daily  . sodium chloride  3 mL Intravenous Q12H      LOS: 6 days   Brea Coleson M.D. [facility] 05/11/2014, 12:29 PM Pager: [phone]  If 7PM-7AM, please contact night-coverage [email or web address] Password [email or web address]  **Disclaimer: This note was dictated with voice recognition software. Similar sounding words can inadvertently be transcribed and this note may contain transcription errors which may not have been corrected upon publication of note.**

## 2014-05-11 NOTE — Progress Notes (Signed)
Pt has attempted to void twice this shift. Bladder scan showd 489 ml. Pt states it feels like she has to go but she can't. New orders to in & out cath the pt. With in & out cath pt voided 950 cc of straw colored urine. Will continue to monitor the pt. Hoover Brunette, RN

## 2014-05-12 DIAGNOSIS — I701 Atherosclerosis of renal artery: Secondary | ICD-10-CM

## 2014-05-12 DIAGNOSIS — R5381 Other malaise: Secondary | ICD-10-CM

## 2014-05-12 LAB — CULTURE, BLOOD (ROUTINE X 2)
CULTURE: NO GROWTH
Culture: NO GROWTH

## 2014-05-12 LAB — URINALYSIS, ROUTINE W REFLEX MICROSCOPIC
BILIRUBIN URINE: NEGATIVE
GLUCOSE, UA: NEGATIVE mg/dL
HGB URINE DIPSTICK: NEGATIVE
Ketones, ur: NEGATIVE mg/dL
Leukocytes, UA: NEGATIVE
Nitrite: NEGATIVE
PH: 6.5 (ref 5.0–8.0)
Protein, ur: NEGATIVE mg/dL
SPECIFIC GRAVITY, URINE: 1.012 (ref 1.005–1.030)
Urobilinogen, UA: 0.2 mg/dL (ref 0.0–1.0)

## 2014-05-12 LAB — OCCULT BLOOD X 1 CARD TO LAB, STOOL: Fecal Occult Bld: NEGATIVE

## 2014-05-12 LAB — BASIC METABOLIC PANEL
ANION GAP: 13 (ref 5–15)
BUN: 46 mg/dL — ABNORMAL HIGH (ref 6–23)
CALCIUM: 9.8 mg/dL (ref 8.4–10.5)
CO2: 29 meq/L (ref 19–32)
Chloride: 100 mEq/L (ref 96–112)
Creatinine, Ser: 1.81 mg/dL — ABNORMAL HIGH (ref 0.50–1.10)
GFR calc Af Amer: 28 mL/min — ABNORMAL LOW (ref >90)
GFR calc non Af Amer: 25 mL/min — ABNORMAL LOW (ref >90)
Glucose, Bld: 113 mg/dL — ABNORMAL HIGH (ref 70–99)
Potassium: 4 mEq/L (ref 3.7–5.3)
SODIUM: 142 meq/L (ref 137–147)

## 2014-05-12 LAB — GLUCOSE, CAPILLARY
GLUCOSE-CAPILLARY: 161 mg/dL — AB (ref 70–99)
GLUCOSE-CAPILLARY: 176 mg/dL — AB (ref 70–99)
Glucose-Capillary: 119 mg/dL — ABNORMAL HIGH (ref 70–99)
Glucose-Capillary: 125 mg/dL — ABNORMAL HIGH (ref 70–99)

## 2014-05-12 LAB — CBC
HEMATOCRIT: 31 % — AB (ref 36.0–46.0)
Hemoglobin: 9.6 g/dL — ABNORMAL LOW (ref 12.0–15.0)
MCH: 25.6 pg — ABNORMAL LOW (ref 26.0–34.0)
MCHC: 31 g/dL (ref 30.0–36.0)
MCV: 82.7 fL (ref 78.0–100.0)
Platelets: 209 10*3/uL (ref 150–400)
RBC: 3.75 MIL/uL — ABNORMAL LOW (ref 3.87–5.11)
RDW: 19.6 % — ABNORMAL HIGH (ref 11.5–15.5)
WBC: 7.8 10*3/uL (ref 4.0–10.5)

## 2014-05-12 MED ORDER — FUROSEMIDE 40 MG PO TABS
60.0000 mg | ORAL_TABLET | Freq: Every day | ORAL | Status: DC
  Administered 2014-05-12 – 2014-05-13 (×2): 60 mg via ORAL
  Filled 2014-05-12 (×2): qty 1

## 2014-05-12 NOTE — Progress Notes (Signed)
Physical Therapy Treatment Patient Details Name: [name] MRN: [id number] DOB: 1928/09/20 Today's Date: 05/12/2014    History of Present Illness Pt is 80 y/o female with history of CAD (status post stenting), hypertension, hyperlipidemia, renal artery stenosis, type II DM with nephropathy, chronic kidney disease stage III-4, gout, chronic anemia, GERD and visually impaired. Presented to the ED with SOB. EMS was called to the home due to progression of symptoms and found her with oxygen saturation in the 50s.       PT Comments    Pt continues to progress with mobility and tolerate increased ambulation. Pt encouraged to continue HEp and gait. Less assist required for transfers today and biggest deficit remains limited vision.   Follow Up Recommendations  Home health PT;Supervision for mobility/OOB     Equipment Recommendations       Recommendations for Other Services       Precautions / Restrictions Precautions Precautions: Fall Precaution Comments: limited vision    Mobility  Bed Mobility Overal bed mobility: Modified Independent                Transfers Overall transfer level: Needs assistance     Sit to Stand: Min guard         General transfer comment: Pt requires cues for hand placement pt relying on posterior pressure of knees against surface to stand  Ambulation/Gait Ambulation/Gait assistance: Min assist Ambulation Distance (Feet): 160 Feet Assistive device: Rolling walker (2 wheeled) Gait Pattern/deviations: Step-through pattern;Decreased stride length;Trunk flexed;Drifts right/left   Gait velocity interpretation: Below normal speed for age/gender General Gait Details: Pt with low vision and required assist to maneuver RW during turns (and to prevent drifting to her Left)   Stairs            Wheelchair Mobility    Modified Rankin (Stroke Patients Only)       Balance                                    Cognition  Arousal/Alertness: Awake/alert Behavior During Therapy: WFL for tasks assessed/performed Overall Cognitive Status: Within Functional Limits for tasks assessed                      Exercises General Exercises - Lower Extremity Ankle Circles/Pumps: AROM;Seated;Both;20 reps Long Arc Quad: AROM;Seated;Both;20 reps Hip Flexion/Marching: AROM;Seated;Both;20 reps    General Comments        Pertinent Vitals/Pain No pain    Home Living                      Prior Function            PT Goals (current goals can now be found in the care plan section) Progress towards PT goals: Progressing toward goals    Frequency       PT Plan Current plan remains appropriate    Co-evaluation             End of Session   Activity Tolerance: Patient tolerated treatment well Patient left: in chair;with call bell/phone within reach;with family/visitor present;with chair alarm set     Time: 4854-6270 PT Time Calculation (min): 23 min  Charges:  $Gait Training: 8-22 mins $Therapeutic Exercise: 8-22 mins                    G Codes:      Nancy Nordmann,  Lehua Flores Beth 05/12/2014, 1:23 PM Elwyn Reach, [place]

## 2014-05-12 NOTE — Progress Notes (Signed)
Patient ID: [name]  female  XFG:[id number]    DOB: [birth year]    DOA: 05/05/2014  PCP: Adella Hare, MD  Assessment/Plan: Principal Problem: Acute hypoxic respiratory failure with hypoxia:  A) Acute on chronic diastolic HF  - Improving, negative output 10 L, weight up today 190lbs - Cardiology following, transitioned to oral Lasix - Creatinine function 1.8 today, decrease Lasix to her home outpatient dose of 60 mg daily, recheck creatinine function tomorrow   B) Community acquired pneumonia  -Continue the Zithromax and Rocephin for total 7 days  - Followup chest x-ray 7/27 much improved - Recommend followup chest x-ray in 4-6 weeks to ensure resolution of pneumonia findings. -  Blood cultures x2: Negative to date.   Urinary retention - Will place Foley catheter, check UA to rule out any UTI  Normo/microcytic anemia  - Hemoglobin stable over the last 3 days.   DM  -currently on Lantus and SSI. Fluctuating and mildly uncontrolled.   Chronic kidney disease, stage IV  -crt had been steadily rising but improved after transfusion but have gradually decreased and probably at baseline now-1.8.   Symptomatic bradycardia  - overnight 7/23-7/24 was hypoxic and had bradycardia with rates down to 40's w/ altered menattion - improved after BiPAP applied - likely a primary respiratory event - no recurrence since that one event . Sinus rhythm on telemetry. And   HYPERTENSION  -controlled   CAD, NATIVE VESSEL  DYSLIPIDEMIA  DVT Prophylaxis:  Code Status:  Family Communication:  Disposition: Hopefully DC tomorrow if stable  Consultants:  Cardiology  Procedures:  None  Antibiotics:  IV Rocephin, Zithromax    Subjective: Patient seen and examined, Shortness of breath improving however having urinary retention issues, straight cath done multiple times  Objective: Weight change: 1.274 kg (2 lb 12.9 oz)  Intake/Output Summary (Last 24 hours) at 05/12/14  1224 Last data filed at 05/12/14 0838  Gross per 24 hour  Intake    180 ml  Output   1900 ml  Net  -1720 ml   Blood pressure 128/51, pulse 83, temperature 98.3 F (36.8 C), temperature source Oral, resp. rate 18, height 5\' 3"  (1.6 m), weight 86.274 kg (190 lb 3.2 oz), SpO2 90.00%.  Physical Exam: General: Alert and awake, oriented x3, not in any acute distress. CVS: S1-S2 clear, no murmur rubs or gallops Chest: Bibasilar crackles improving, no wheezing Abdomen: soft nontender, nondistended, normal bowel sounds  Extremities: no cyanosis, clubbing, trace edema noted bilaterally   Lab Results: Basic Metabolic Panel:  Recent Labs Lab 05/11/14 0530 05/12/14 0700  NA 143 142  K 3.3* 4.0  CL 98 100  CO2 26 29  GLUCOSE 114* 113*  BUN 47* 46*  CREATININE 1.77* 1.81*  CALCIUM 9.7 9.8   Liver Function Tests:  Recent Labs Lab 05/07/14 0319 05/09/14 0333  AST 13 12  ALT <5 6  ALKPHOS 106 96  BILITOT 0.3 0.3  PROT 6.6 7.0  ALBUMIN 2.9* 2.9*   No results found for this basename: LIPASE, AMYLASE,  in the last 168 hours No results found for this basename: AMMONIA,  in the last 168 hours CBC:  Recent Labs Lab 05/10/14 0345 05/12/14 0700  WBC 8.0 7.8  HGB 9.3* 9.6*  HCT 29.8* 31.0*  MCV 80.1 82.7  PLT 190 209   Cardiac Enzymes:  Recent Labs Lab 05/05/14 1705 05/05/14 2033 05/06/14 0242  TROPONINI <0.30 <0.30 0.42*   BNP: No components found with this basename: POCBNP,  CBG:  Recent Labs Lab 05/11/14 1133 05/11/14 1642 05/11/14 2043 05/12/14 0725 05/12/14 1158  GLUCAP 199* 180* 175* 119* 161*     Micro Results: Recent Results (from the past 240 hour(s))  URINE CULTURE     Status: None   Collection Time    05/05/14 10:18 AM      Result Value Ref Range Status   Specimen Description URINE, CATHETERIZED   Final   Special Requests NONE   Final   Culture  Setup Time     Final   Value: 05/05/2014 18:35     Performed at [facility]     Final   Value: NO GROWTH     Performed at [facility]   Culture     Final   Value: NO GROWTH     Performed at [facility]   Report Status 05/06/2014 FINAL   Final  MRSA PCR SCREENING     Status: None   Collection Time    05/05/14  2:40 PM      Result Value Ref Range Status   MRSA by PCR NEGATIVE  NEGATIVE Final   Comment:            The GeneXpert MRSA Assay (FDA     approved for NASAL specimens     only), is one component of a     comprehensive MRSA colonization     surveillance program. It is not     intended to diagnose MRSA     infection nor to guide or     monitor treatment for     MRSA infections.  CULTURE, BLOOD (ROUTINE X 2)     Status: None   Collection Time    05/06/14  7:20 AM      Result Value Ref Range Status   Specimen Description BLOOD RIGHT HAND   Final   Special Requests BOTTLES DRAWN AEROBIC ONLY 3CC   Final   Culture  Setup Time     Final   Value: 05/06/2014 12:53     Performed at [facility]   Culture     Final   Value: NO GROWTH 5 DAYS     Performed at [facility]   Report Status 05/12/2014 FINAL   Final  CULTURE, BLOOD (ROUTINE X 2)     Status: None   Collection Time    05/06/14  7:40 AM      Result Value Ref Range Status   Specimen Description BLOOD RIGHT HAND   Final   Special Requests BOTTLES DRAWN AEROBIC ONLY 2CC   Final   Culture  Setup Time     Final   Value: 05/06/2014 12:53     Performed at [facility]   Culture     Final   Value: NO GROWTH 5 DAYS     Performed at [facility]   Report Status 05/12/2014 FINAL   Final    Studies/Results: Ct Abdomen Pelvis Wo Contrast  05/08/2014   CLINICAL DATA:  80 year old female with abdominal and pelvic pain. Evaluate for retroperitoneal hematoma.  EXAM: CT ABDOMEN AND PELVIS WITHOUT CONTRAST  TECHNIQUE: Multidetector CT imaging of the abdomen and pelvis was performed following the standard protocol without IV contrast.  COMPARISON:   02/24/2013 CT  FINDINGS: Cardiomegaly, small bilateral pleural effusions and bibasilar atelectasis noted.  The liver, spleen, adrenal glands and pancreas are unremarkable.  Bilateral renal atrophy identified.  Patient is status post cholecystectomy.  There is no evidence of free fluid, enlarged lymph nodes, biliary dilation or abdominal aortic aneurysm.  Colonic diverticulosis noted without diverticulitis.  There is no evidence of bowel obstruction, pneumoperitoneum or focal collection.  There is no evidence of abdominal, pelvic or retroperitoneal hematoma.  A calcified fibroid is noted.  Moderate atherosclerotic vascular and abdominal aortic calcifications noted.  A Foley catheter within the bladder is present.  No acute or suspicious bony abnormalities are identified.  IMPRESSION: No evidence of abdominal, pelvic or retroperitoneal hematoma.  Small bilateral pleural effusions with bibasilar atelectasis.  Colonic diverticulosis without diverticulitis.  Cardiomegaly and abdominal aortic atherosclerotic disease.   Electronically Signed   By: Hassan Rowan M.D.   On: 05/08/2014 19:39   Dg Chest Port 1 View  05/09/2014   CLINICAL DATA:  Follow up infiltrate.  Respiratory distress.  EXAM: PORTABLE CHEST - 1 VIEW  COMPARISON:  05/06/2014.  FINDINGS: Mediastinum and hilar structures are normal. Cardiomegaly. Improving pulmonary vascular prominence. Interim near complete resolution of bilateral infiltrates. Small pleural effusion. These findings suggest improving congestive heart failure. No pneumothorax. No acute osseus abnormality.  IMPRESSION: Improving congestive heart failure with interval near complete resolution of pulmonary edema.   Electronically Signed   By: [place]   On: 05/09/2014 07:14   Dg Chest Port 1 View  05/06/2014   CLINICAL DATA:  Respiratory distress.  EXAM: PORTABLE CHEST - 1 VIEW  COMPARISON:  Chest radiograph performed 05/05/2014  FINDINGS: The lungs are well-aerated. There is persistent  bilateral airspace opacification, perhaps slightly improved from the prior study. This may reflect multifocal pneumonia or pulmonary edema. Small bilateral pleural effusions are suspected. No pneumothorax is seen.  The cardiomediastinal silhouette is mildly enlarged. No acute osseous abnormalities are seen. An external pacing pad is noted overlying the right hemithorax.  IMPRESSION: 1. Persistent bilateral airspace opacification, perhaps slightly improved from the prior study. This may reflect multifocal pneumonia or pulmonary edema. Suspect small bilateral pleural effusions. 2. Mild cardiomegaly.   Electronically Signed   By: Garald Balding M.D.   On: 05/06/2014 02:18   Dg Chest Port 1 View  05/05/2014   CLINICAL DATA:  Generalized weakness and nausea.  EXAM: PORTABLE CHEST - 1 VIEW  COMPARISON:  11/13/2013.  FINDINGS: Support apparatus: Monitoring leads project over the chest.  Cardiomediastinal Silhouette: Partially obscured. Cardiopericardial silhouette does appear enlarged.  Lungs: Diffuse bilateral RIGHT-greater-than-LEFT airspace disease, with a perihilar and basilar predominant distribution. Consolidation at both lung bases, greater on the RIGHT than LEFT. No pneumothorax.  Effusions:  Probable small RIGHT pleural effusion.  Other:  None.  IMPRESSION: Constellation of findings most compatible with moderate CHF. Airspace disease is favored to represent asymmetric/atypical pulmonary edema. Superimposed pneumonia is less unlikely but cannot be excluded.   Electronically Signed   By: Dereck Ligas M.D.   On: 05/05/2014 09:29    Medications: Scheduled Meds: . allopurinol  200 mg Oral Daily  . amLODipine  5 mg Oral Daily  . aspirin  81 mg Oral Daily  . atorvastatin  20 mg Oral q1800  . atropine  1 drop Left Eye BID  . azithromycin  500 mg Oral Daily  . citalopram  20 mg Oral Daily  . dorzolamide-timolol  1 drop Both Eyes BID  . furosemide  60 mg Oral Daily  . gabapentin  300 mg Oral BID  .  hydrALAZINE  100 mg Oral TID  . insulin aspart  0-5 Units Subcutaneous QHS  . insulin aspart  0-9  Units Subcutaneous TID WC  . insulin aspart  3 Units Subcutaneous TID WC  . insulin glargine  5 Units Subcutaneous QHS  . latanoprost  1 drop Left Eye QHS  . pantoprazole  40 mg Oral Daily  . sodium chloride  3 mL Intravenous Q12H      LOS: 7 days   Allex Lapoint M.D. [facility] 05/12/2014, 12:24 PM Pager: [phone]  If 7PM-7AM, please contact night-coverage [email or web address] Password [email or web address]  **Disclaimer: This note was dictated with voice recognition software. Similar sounding words can inadvertently be transcribed and this note may contain transcription errors which may not have been corrected upon publication of note.**

## 2014-05-13 DIAGNOSIS — J189 Pneumonia, unspecified organism: Secondary | ICD-10-CM

## 2014-05-13 LAB — URINE CULTURE
CULTURE: NO GROWTH
Colony Count: NO GROWTH

## 2014-05-13 LAB — BASIC METABOLIC PANEL
ANION GAP: 17 — AB (ref 5–15)
BUN: 45 mg/dL — AB (ref 6–23)
CHLORIDE: 95 meq/L — AB (ref 96–112)
CO2: 24 mEq/L (ref 19–32)
Calcium: 9.9 mg/dL (ref 8.4–10.5)
Creatinine, Ser: 1.65 mg/dL — ABNORMAL HIGH (ref 0.50–1.10)
GFR calc Af Amer: 32 mL/min — ABNORMAL LOW (ref >90)
GFR calc non Af Amer: 27 mL/min — ABNORMAL LOW (ref >90)
Glucose, Bld: 239 mg/dL — ABNORMAL HIGH (ref 70–99)
POTASSIUM: 3.9 meq/L (ref 3.7–5.3)
Sodium: 136 mEq/L — ABNORMAL LOW (ref 137–147)

## 2014-05-13 LAB — GLUCOSE, CAPILLARY
GLUCOSE-CAPILLARY: 240 mg/dL — AB (ref 70–99)
Glucose-Capillary: 117 mg/dL — ABNORMAL HIGH (ref 70–99)

## 2014-05-13 NOTE — Progress Notes (Signed)
Left message on voicemail that patient is pending discharge when family Maurie Boettcher RN

## 2014-05-13 NOTE — Progress Notes (Signed)
Occupational Therapy Treatment Patient Details Name: [name] MRN: [id number] DOB: May 21, 1928 Today's Date: 05/13/2014    History of present illness Pt is 80 y/o female with history of CAD (status post stenting), hypertension, hyperlipidemia, renal artery stenosis, type II DM with nephropathy, chronic kidney disease stage III-4, gout, chronic anemia, GERD and visually impaired. Presented to the ED with SOB. EMS was called to the home due to progression of symptoms and found her with oxygen saturation in the 50s.      OT comments  Pt progressing with BADLs and functional mobility.   She requires min guard assist.  She is eager for discharge   Follow Up Recommendations  Home health OT;Supervision/Assistance - 24 hour    Equipment Recommendations  None recommended by OT    Recommendations for Other Services      Precautions / Restrictions Precautions Precautions: Fall Precaution Comments: limited vision       Mobility Bed Mobility Overal bed mobility: Modified Independent                Transfers Overall transfer level: Needs assistance Equipment used: Rolling walker (2 wheeled) Transfers: Sit to/from [facility] Sit to Stand: Supervision Stand pivot transfers: Min guard            Balance Overall balance assessment: Needs assistance Sitting-balance support: Feet supported Sitting balance-Leahy Scale: Good     Standing balance support: No upper extremity supported Standing balance-Leahy Scale: Fair                     ADL       Grooming: Wash/dry Radiographer, therapeutic: Min guard;Ambulation;Comfort height toilet   Toileting- Clothing Manipulation and Hygiene: Min guard;Sit to/from stand                Vision                     Perception     Praxis      Cognition   Behavior During Therapy: [name] for tasks assessed/performed Overall Cognitive  Status: Within Functional Limits for tasks assessed                       Extremity/Trunk Assessment               Exercises     Shoulder Instructions       General Comments      Pertinent Vitals/ Pain       Denies pain   Home Living                                          Prior Functioning/Environment              Frequency Min 2X/week     Progress Toward Goals  OT Goals(current goals can now be found in the care plan section)  Progress towards OT goals: Progressing toward goals  ADL Goals Pt Will Perform Grooming: with set-up;with supervision;standing Pt Will Perform Upper Body Bathing: with set-up;with supervision;sitting Pt Will Perform Lower Body Bathing: with set-up;with supervision;sit to/from stand Pt Will Perform Upper Body Dressing: with set-up;with supervision;sitting Pt Will Perform Lower Body Dressing: with set-up;with supervision;sit to/from stand Pt Will Transfer to Toilet: with set-up;with  supervision;ambulating;regular height toilet;grab bars Pt Will Perform Toileting - Clothing Manipulation and hygiene: with set-up;with supervision;sit to/from stand  Plan Discharge plan remains appropriate    Co-evaluation                 End of Session Equipment Utilized During Treatment: Rolling walker   Activity Tolerance Patient tolerated treatment well   Patient Left in bed;with call bell/phone within reach   Nurse Communication          Time: 6659-9357 OT Time Calculation (min): 21 min  Charges: OT General Charges $OT Visit: 1 Procedure OT Treatments $Self Care/Home Management : 8-22 mins  Rose Hegner M 05/13/2014, 7:13 PM

## 2014-05-13 NOTE — Progress Notes (Signed)
Discharged to home with son via private vehicle to home, med reconc and discharge instructions reviewed with son, verbalizes understanding via teachback, was given eyegtts x 3, Hazle Nordmann RN

## 2014-05-13 NOTE — Discharge Summary (Signed)
Physician Discharge Summary  Patient ID: [name] MRN: [id number] DOB/AGE: Feb 27, 1928 80 y.o.  Admit date: 05/05/2014 Discharge date: 05/13/2014  Primary Care Physician:  Adella Hare, MD  Discharge Diagnoses:    . Acute on chronic diastolic HF (heart failure) . Acute respiratory failure with hypoxia- resolved . CAP (community acquired pneumonia) . IDDM . DYSLIPIDEMIA . HYPERTENSION . CAD, NATIVE VESSEL . Chronic kidney disease, stage IV (severe) . UNSPECIFIED ANEMIA . Symptomatic bradycardia . Hyperkalemia  Consults: Cardiology, Dr Angelena Form   Recommendations for Outpatient Follow-up:  Please follow-up PCP, cardiology in 2 weeks, check BMET for renal function  Recommend followup chest x-ray in 4-6 weeks to ensure resolution of pneumonia findings.   Allergies:  No Known Allergies   Discharge Medications:   Medication List         allopurinol 100 MG tablet  Commonly known as:  ZYLOPRIM  Take 100 mg by mouth daily.     amLODipine 5 MG tablet  Commonly known as:  NORVASC  Take 1 tablet (5 mg total) by mouth daily.     aspirin EC 81 MG tablet  Take 81 mg by mouth daily.     atropine 1 % ophthalmic solution  Place 1 drop into the left eye 2 (two) times daily.     brimonidine 0.1 % Soln  Commonly known as:  ALPHAGAN P  Apply 1 drop to eye 2 (two) times daily.     citalopram 20 MG tablet  Commonly known as:  CELEXA  Take 1 tablet (20 mg total) by mouth daily.     colchicine 0.6 MG tablet  Take 1 tablet (0.6 mg total) by mouth 2 (two) times daily as needed (for gout flare up).     [employer] VITAMIN D3 1000 UNITS capsule  Generic drug:  Cholecalciferol  Take 1,000 Units by mouth daily.     diazepam 5 MG tablet  Commonly known as:  VALIUM  Take 5 mg by mouth every 6 (six) hours as needed for anxiety.     dorzolamide-timolol 22.3-6.8 MG/ML ophthalmic solution  Commonly known as:  COSOPT  Place 1 drop into both eyes 2 (two) times daily.     furosemide  20 MG tablet  Commonly known as:  LASIX  Take 3 tablets (60 mg total) by mouth daily.     gabapentin 600 MG tablet  Commonly known as:  NEURONTIN  Take 1 tablet (600 mg total) by mouth 2 (two) times daily.     glucose blood test strip  Commonly known as:  ACCU-CHEK SMARTVIEW  Use as instructed to check blood sugars 2 times per day dx code 250.00     hydrALAZINE 100 MG tablet  Commonly known as:  APRESOLINE  Take 100 mg by mouth 3 (three) times daily.     insulin glargine 100 UNIT/ML injection  Commonly known as:  LANTUS  Inject 0.04 mLs (4 Units total) into the skin at bedtime.     insulin lispro 100 UNIT/ML injection  Commonly known as:  HUMALOG  Inject 10 Units into the skin 2 (two) times daily.     KLOR-CON M10 10 MEQ tablet  Generic drug:  potassium chloride  Take 20 mEq by mouth daily.     metoprolol tartrate 25 MG tablet  Commonly known as:  LOPRESSOR  Take 3 tablets (75 mg total) by mouth 2 (two) times daily.     nitroGLYCERIN 0.4 MG SL tablet  Commonly known as:  NITROSTAT  Place 0.4 mg  under the tongue as needed. For chest pain.     pantoprazole 40 MG tablet  Commonly known as:  PROTONIX  Take 1 tablet (40 mg total) by mouth daily.     pravastatin 80 MG tablet  Commonly known as:  PRAVACHOL  Take 1 tablet (80 mg total) by mouth daily.         Brief H and P: For complete details please refer to admission H and P, but in brief [name] is a 80 y.o. female with history of CAD, status post stenting, hypertension, hyperlipidemia, renal artery stenosis, type II DM with nephropathy, chronic kidney disease stage III-4, gout, chronic anemia, GERD and visually impaired, presented to the ED with the above complaints. She states that she was feeling generally unwell yesterday and had mild dyspnea. She discussed with her son who advised her to come to the hospital but she decided to wait it out. She woke up at approximately 4 AM today with acute dyspnea. She  denies chest pain, palpitations, cough, fever, chills. She had some wheezing. She claims compliance with all her medications. She denies compliance with fluid restriction. She has mild leg swelling. She also complained of some nausea yesterday but no vomiting or abdominal pain. As per report, EMS found her with oxygen saturation in the 50s and with respiratory extremis. On arrival to the ED, she was saturating at 99% with nonrebreather mask but had difficulty speaking short sentences due to respiratory distress. She has received prolonged nebulization, 40 mg IV Lasix x2 and she states that she feels much better with improvement in her breathing. In the ED, glucose 255, creatinine 1.82, WBC 18.2, hemoglobin 7.2, proBNP 2645 and chest x-ray compatible with CHF.   Hospital Course:  80 y.o. female with history of CAD (status post stenting), hypertension, hyperlipidemia, renal artery stenosis, type II DM with nephropathy, chronic kidney disease stage III-IV, gout, chronic anemia, GERD and visually impaired who presented to the ED with SOB. On arrival to the ED, she was saturating at 99% with nonrebreather mask but had difficulty speaking short sentences. She received prolonged nebulization after arrival to the ER, with 40 mg IV Lasix x 2 and endorsed improvement in her breathing. In the ED, glucose 255, creatinine 1.82, WBC 18.2, hemoglobin 7.2, proBNP 2645 and chest x-ray compatible with CHF  Acute hypoxic respiratory failure with hypoxia: resolved A) Acute on chronic diastolic HF -resolved, patient has negative output 13.3L, weight 189lbs (from 201). Cardiology was consulted and patient has been transitioned to oral lasix at her home dose. Cr function has stabilized. Marland Kitchen    B) Community acquired pneumonia  Completed empiric abx for 7days.  f/u CXR 7/27 much improved - saturating 93% on RA. Recommend followup chest x-ray in 4-6 weeks to ensure resolution of pneumonia findings. Blood cultures x2: Negative to  date  Urinary retention  Foley catheter was briefly placed, UA was negative for UTI  Normo/microcytic anemia  - Hemoglobin stable   DM  -currently on Lantus and SSI. Fluctuating and mildly uncontrolled.   Chronic kidney disease, stage IV  -crt had been steadily rising but improved after transfusion but have gradually decreased and probably at baseline now-1.8.   Symptomatic bradycardia  - overnight 7/23-7/24 was hypoxic and had bradycardia with rates down to 40's w/ altered menattion - improved after BiPAP applied - likely a primary respiratory event - no recurrence since that one event . Sinus rhythm on telemetry.    HYPERTENSION  -controlled   Day  of Discharge BP 150/76  Pulse 79  Temp(Src) 98.2 F (36.8 C) (Oral)  Resp 18  Ht 5\' 3"  (1.6 m)  Wt 86 kg (189 lb 9.5 oz)  BMI 33.59 kg/m2  SpO2 93%  Physical Exam: General: Alert and awake oriented x3 not in any acute distress. HEENT: anicteric sclera, pupils reactive to light and accommodation CVS: S1-S2 clear no murmur rubs or gallops Chest: clear to auscultation bilaterally, no wheezing rales or rhonchi Abdomen: soft nontender, nondistended, normal bowel sounds Extremities: no cyanosis, clubbing or edema noted bilaterally Neuro: Cranial nerves II-XII intact, no focal neurological deficits   The results of significant diagnostics from this hospitalization (including imaging, microbiology, ancillary and laboratory) are listed below for reference.    LAB RESULTS: Basic Metabolic Panel:  Recent Labs Lab 05/12/14 0700 05/13/14 1120  NA 142 136*  K 4.0 3.9  CL 100 95*  CO2 29 24  GLUCOSE 113* 239*  BUN 46* 45*  CREATININE 1.81* 1.65*  CALCIUM 9.8 9.9   Liver Function Tests:  Recent Labs Lab 05/07/14 0319 05/09/14 0333  AST 13 12  ALT <5 6  ALKPHOS 106 96  BILITOT 0.3 0.3  PROT 6.6 7.0  ALBUMIN 2.9* 2.9*   No results found for this basename: LIPASE, AMYLASE,  in the last 168 hours No results found for  this basename: AMMONIA,  in the last 168 hours CBC:  Recent Labs Lab 05/10/14 0345 05/12/14 0700  WBC 8.0 7.8  HGB 9.3* 9.6*  HCT 29.8* 31.0*  MCV 80.1 82.7  PLT 190 209   Cardiac Enzymes: No results found for this basename: CKTOTAL, CKMB, CKMBINDEX, TROPONINI,  in the last 168 hours BNP: No components found with this basename: POCBNP,  CBG:  Recent Labs Lab 05/13/14 0732 05/13/14 1125  GLUCAP 117* 240*    Significant Diagnostic Studies:  Dg Chest Port 1 View  05/06/2014   CLINICAL DATA:  Respiratory distress.  EXAM: PORTABLE CHEST - 1 VIEW  COMPARISON:  Chest radiograph performed 05/05/2014  FINDINGS: The lungs are well-aerated. There is persistent bilateral airspace opacification, perhaps slightly improved from the prior study. This may reflect multifocal pneumonia or pulmonary edema. Small bilateral pleural effusions are suspected. No pneumothorax is seen.  The cardiomediastinal silhouette is mildly enlarged. No acute osseous abnormalities are seen. An external pacing pad is noted overlying the right hemithorax.  IMPRESSION: 1. Persistent bilateral airspace opacification, perhaps slightly improved from the prior study. This may reflect multifocal pneumonia or pulmonary edema. Suspect small bilateral pleural effusions. 2. Mild cardiomegaly.   Electronically Signed   By: Garald Balding M.D.   On: 05/06/2014 02:18   Dg Chest Port 1 View  05/05/2014   CLINICAL DATA:  Generalized weakness and nausea.  EXAM: PORTABLE CHEST - 1 VIEW  COMPARISON:  11/13/2013.  FINDINGS: Support apparatus: Monitoring leads project over the chest.  Cardiomediastinal Silhouette: Partially obscured. Cardiopericardial silhouette does appear enlarged.  Lungs: Diffuse bilateral RIGHT-greater-than-LEFT airspace disease, with a perihilar and basilar predominant distribution. Consolidation at both lung bases, greater on the RIGHT than LEFT. No pneumothorax.  Effusions:  Probable small RIGHT pleural effusion.  Other:   None.  IMPRESSION: Constellation of findings most compatible with moderate CHF. Airspace disease is favored to represent asymmetric/atypical pulmonary edema. Superimposed pneumonia is less unlikely but cannot be excluded.   Electronically Signed   By: Dereck Ligas M.D.   On: 05/05/2014 09:29       Disposition and Follow-up:     Discharge Instructions   (  HEART FAILURE PATIENTS) Call MD:  Anytime you have any of the following symptoms: 1) 3 pound weight gain in 24 hours or 5 pounds in 1 week 2) shortness of breath, with or without a dry hacking cough 3) swelling in the hands, feet or stomach 4) if you have to sleep on extra pillows at night in order to breathe.    Complete by:  As directed      Diet - low sodium heart healthy    Complete by:  As directed      Increase activity slowly    Complete by:  As directed             DISPOSITION: home   DIET: heart healthy    DISCHARGE FOLLOW-UP Follow-up Information   Follow up with [facility]. (Physical/Occupational Therapy and Registered Nurse for disease management. )    Contact information:   [address] [place] [address] [phone]       Follow up with Adella Hare, MD. Schedule an appointment as soon as possible for a visit in 10 days. (for hospital follow-up)    Specialty:  Internal Medicine      Follow up with [facility], MD. Schedule an appointment as soon as possible for a visit in 2 weeks. (for hospital follow-up)    Specialty:  Cardiology   Contact information:   [address] [place] [address] [phone]       Time spent on Discharge: 35 mins  Signed:   Bryttney Netzer M.D. [facility] 05/13/2014, 1:56 PM Pager: [phone]   **Disclaimer: This note was dictated with voice recognition software. Similar sounding words can inadvertently be transcribed and this note may contain transcription errors which may not have been corrected upon  publication of note.**

## 2014-05-17 ENCOUNTER — Telehealth: Payer: Self-pay | Admitting: *Deleted

## 2014-05-17 NOTE — Telephone Encounter (Signed)
Call from [facility] a Physical Therapist with [facility] [phone]  PT is seeing pt for in home evaluation. He called to report pt is not feeling well today.  CBG is 120 BP 70/50   Heart rate - 50 @ rest   Pt seems more confused than normal.  I returned call to Clair Gulling and he stated Occupational Therapist got a BP reading of 130/70 They are sending a nurse out to see pt. I gave him the name of pt's PCP, Dr Linda Hedges and he will call that practice. I did not realize this was not [facility] patient until notes were written.

## 2014-05-27 ENCOUNTER — Telehealth: Payer: Self-pay | Admitting: Cardiovascular Disease

## 2014-05-27 NOTE — Telephone Encounter (Signed)
New message      Will you ok home health to continue to see the patient for monitoring chf--originial orders came from the hosp---Dr Collar will follow her starting in sept.  Home health is now out of visits.  Pt does have bilateral lung crackles but no sob.  Weight is ok at this time. It was 190--earlier this month it was 198.  Ok to call and give verbal order.

## 2014-05-27 NOTE — Telephone Encounter (Signed)
**Note De-Identified  Obfuscation** Nira Conn is advised that Dr Angelena Form will not be in the office until Thursday 8/20 and that I am forwarding this message to him to address. She verbalized understanding.

## 2014-05-30 NOTE — Telephone Encounter (Signed)
[facility] aware that Dr Angelena Form okay for pt to continue Home health.

## 2014-05-30 NOTE — Telephone Encounter (Signed)
Can we call and give a verbal order to continue Home health? Thanks, [name]

## 2014-05-31 ENCOUNTER — Telehealth: Payer: Self-pay | Admitting: *Deleted

## 2014-05-31 NOTE — Telephone Encounter (Signed)
Heather with [facility] [phone] called for orders. Informed pt sees Dr Linda Hedges - not a pt [facility]. Hilda Blades Zyquan Crotty RN 05/31/14 4:35PM

## 2014-06-13 ENCOUNTER — Other Ambulatory Visit: Payer: Self-pay | Admitting: *Deleted

## 2014-06-13 MED ORDER — POTASSIUM CHLORIDE CRYS ER 10 MEQ PO TBCR: 20.0000 meq | EXTENDED_RELEASE_TABLET | Freq: Every day | ORAL | Status: DC

## 2014-07-08 ENCOUNTER — Ambulatory Visit: Payer: Medicare Other | Admitting: Internal Medicine

## 2014-07-13 ENCOUNTER — Telehealth: Payer: Self-pay | Admitting: *Deleted

## 2014-07-13 NOTE — Telephone Encounter (Signed)
Ok for verbal 

## 2014-07-13 NOTE — Telephone Encounter (Signed)
Called nurse no answer LMOM with md response...Johny Chess

## 2014-07-13 NOTE — Telephone Encounter (Signed)
Left msg on triage requesting verbal order to re-certify pt to continue education on diabetes & CHF, and medication management. Is this ok...[name]

## 2014-08-03 ENCOUNTER — Other Ambulatory Visit: Payer: Self-pay | Admitting: Endocrinology

## 2014-08-07 ENCOUNTER — Other Ambulatory Visit: Payer: Self-pay | Admitting: Internal Medicine

## 2014-08-11 ENCOUNTER — Other Ambulatory Visit: Payer: Self-pay | Admitting: Cardiovascular Disease

## 2014-08-18 ENCOUNTER — Telehealth: Payer: Self-pay | Admitting: Internal Medicine

## 2014-08-18 NOTE — Telephone Encounter (Signed)
Patient Information:  Caller Name: [name]  Phone: [phone]  Patient: [name], [name]  Gender: Female  DOB: [birth year]  Age: 80 Years  PCP: Other  Office Follow Up:  Does the office need to follow up with this patient?: Yes  Instructions For The Office: Caller declined appointment at [facility] office for 13:00 with Dr. Maudie Mercury, states he could not get her  there at that time.  He states plan to call later to schedule after searching for appointment at [facility] for both 08/18/14 and 08/19/14.  Please follow up if there is appointment that can be offered to him.  Thank you.   Symptoms  Reason For Call & Symptoms: Son calling about knot in the back of her right thigh; it is sore, estimated size of a pool ball.  Emergent symptoms ruled out.  See Today or Tomorrow in Office per Skin Lesion - Moles or Growths guideline due to Caller cannot describe it clearly.  Reviewed Health History In EMR: Yes  Reviewed Medications In EMR: Yes  Reviewed Allergies In EMR: Yes  Reviewed Surgeries / Procedures: Yes  Date of Onset of Symptoms: 08/16/2014  Guideline(s) Used:  Skin Lesion - Moles or Growths  Disposition Per Guideline:   See Today or Tomorrow in Office  Reason For Disposition Reached:   Caller cannot describe it clearly  Advice Given:  Call Back If:  You become worse.  Patient Will Follow Care Advice:  YES

## 2014-08-22 ENCOUNTER — Other Ambulatory Visit: Payer: Self-pay | Admitting: Cardiovascular Disease

## 2014-08-25 ENCOUNTER — Ambulatory Visit (INDEPENDENT_AMBULATORY_CARE_PROVIDER_SITE_OTHER): Payer: Medicare Other | Admitting: Internal Medicine

## 2014-08-25 ENCOUNTER — Encounter: Payer: Self-pay | Admitting: Internal Medicine

## 2014-08-25 ENCOUNTER — Other Ambulatory Visit (INDEPENDENT_AMBULATORY_CARE_PROVIDER_SITE_OTHER): Payer: Medicare Other

## 2014-08-25 VITALS — BP 140/72 | HR 73 | Temp 98.8°F | Ht 63.0 in | Wt 181.0 lb

## 2014-08-25 DIAGNOSIS — E785 Hyperlipidemia, unspecified: Secondary | ICD-10-CM

## 2014-08-25 DIAGNOSIS — N6320 Unspecified lump in the left breast, unspecified quadrant: Secondary | ICD-10-CM | POA: Insufficient documentation

## 2014-08-25 DIAGNOSIS — E119 Type 2 diabetes mellitus without complications: Secondary | ICD-10-CM

## 2014-08-25 DIAGNOSIS — Z23 Encounter for immunization: Secondary | ICD-10-CM

## 2014-08-25 DIAGNOSIS — I10 Essential (primary) hypertension: Secondary | ICD-10-CM

## 2014-08-25 DIAGNOSIS — R2242 Localized swelling, mass and lump, left lower limb: Secondary | ICD-10-CM

## 2014-08-25 DIAGNOSIS — R2240 Localized swelling, mass and lump, unspecified lower limb: Secondary | ICD-10-CM | POA: Insufficient documentation

## 2014-08-25 DIAGNOSIS — N63 Unspecified lump in breast: Secondary | ICD-10-CM

## 2014-08-25 LAB — CBC WITH DIFFERENTIAL/PLATELET
BASOS ABS: 0 10*3/uL (ref 0.0–0.1)
BASOS PCT: 0.1 % (ref 0.0–3.0)
EOS ABS: 0.1 10*3/uL (ref 0.0–0.7)
Eosinophils Relative: 1 % (ref 0.0–5.0)
HCT: 36 % (ref 36.0–46.0)
HEMOGLOBIN: 11.3 g/dL — AB (ref 12.0–15.0)
Lymphocytes Relative: 8.7 % — ABNORMAL LOW (ref 12.0–46.0)
Lymphs Abs: 0.7 10*3/uL (ref 0.7–4.0)
MCHC: 31.5 g/dL (ref 30.0–36.0)
MCV: 92.8 fl (ref 78.0–100.0)
MONO ABS: 0.5 10*3/uL (ref 0.1–1.0)
Monocytes Relative: 6 % (ref 3.0–12.0)
NEUTROS ABS: 6.7 10*3/uL (ref 1.4–7.7)
Neutrophils Relative %: 84.2 % — ABNORMAL HIGH (ref 43.0–77.0)
Platelets: 240 10*3/uL (ref 150.0–400.0)
RBC: 3.88 Mil/uL (ref 3.87–5.11)
RDW: 16.8 % — AB (ref 11.5–15.5)
WBC: 7.9 10*3/uL (ref 4.0–10.5)

## 2014-08-25 LAB — HEPATIC FUNCTION PANEL
ALK PHOS: 113 U/L (ref 39–117)
ALT: 10 U/L (ref 0–35)
AST: 17 U/L (ref 0–37)
Albumin: 3.3 g/dL — ABNORMAL LOW (ref 3.5–5.2)
BILIRUBIN DIRECT: 0 mg/dL (ref 0.0–0.3)
Total Bilirubin: 0.3 mg/dL (ref 0.2–1.2)
Total Protein: 7.9 g/dL (ref 6.0–8.3)

## 2014-08-25 LAB — LIPID PANEL
CHOL/HDL RATIO: 5
Cholesterol: 154 mg/dL (ref 0–200)
HDL: 29.8 mg/dL — ABNORMAL LOW (ref >39.00)
LDL Cholesterol: 87 mg/dL (ref 0–99)
NONHDL: 124.2
Triglycerides: 188 mg/dL — ABNORMAL HIGH (ref 0.0–149.0)
VLDL: 37.6 mg/dL (ref 0.0–40.0)

## 2014-08-25 LAB — HEMOGLOBIN A1C: HEMOGLOBIN A1C: 6.4 % (ref 4.6–6.5)

## 2014-08-25 LAB — BASIC METABOLIC PANEL
BUN: 44 mg/dL — ABNORMAL HIGH (ref 6–23)
CALCIUM: 9.9 mg/dL (ref 8.4–10.5)
CHLORIDE: 109 meq/L (ref 96–112)
CO2: 21 meq/L (ref 19–32)
Creatinine, Ser: 1.8 mg/dL — ABNORMAL HIGH (ref 0.4–1.2)
GFR: 33.78 mL/min — ABNORMAL LOW (ref >60.00)
Glucose, Bld: 86 mg/dL (ref 70–99)
Potassium: 4.1 mEq/L (ref 3.5–5.1)
Sodium: 143 mEq/L (ref 135–145)

## 2014-08-25 LAB — TSH: TSH: 2.21 u[IU]/mL (ref 0.35–4.50)

## 2014-08-25 MED ORDER — LEVOFLOXACIN 250 MG PO TABS: 250.0000 mg | ORAL_TABLET | Freq: Every day | ORAL | Status: DC

## 2014-08-25 NOTE — Patient Instructions (Addendum)
You had the flu shot today  Please take all new medication as prescribed - the antibiotic  Please continue all other medications as before   Please have the pharmacy call with any other refills you may need.  You will be contacted regarding the referral for: diagnostic mammogram, as well as the MRI for the left leg  You will be contacted regarding the referral for: General Surgury for both the breast mass and leg mass  Please continue your efforts at being more active, low cholesterol diabetic diet, and weight control.  Please keep your appointments with your specialists as you may have planned  Please go to the LAB in the Basement (turn left off the elevator) for the tests to be done today  You will be contacted by phone if any changes need to be made immediately.  Otherwise, you will receive a letter about your results with an explanation, but please check with MyChart first.   Please return in 6 months, or sooner if needed

## 2014-08-25 NOTE — Assessment & Plan Note (Addendum)
Very unusual rather dramatic at least 6 cm mass like lesion to distal post left leg, more medial aspect, with erythema, induration and firmness, but nonpulsatile, nonfluctuant and little discomfort to palpate.  Etiology unclear but cant r/o infection, or vascular abnormality, or even sarcoma.  Will tx for now with levaquin course, but no clear will be effective, will also need MRI femur w and w/o CM to further eval; other referral such as to [facility] to be determined if needed  Note:  Total time for pt hx, exam, review of record with pt in the room, determination of diagnoses and plan for further eval and tx is > 40 min, with over 50% spent in coordination and counseling of patient

## 2014-08-25 NOTE — Progress Notes (Signed)
Pre visit review using our clinic review tool, if applicable. No additional management support is needed unless otherwise documented below in the visit note. 

## 2014-08-25 NOTE — Assessment & Plan Note (Signed)
stable overall by history and exam, recent data reviewed with pt, and pt to continue medical treatment as before,  to f/u any worsening symptoms or concerns Lab Results  Component Value Date   LDLCALC 84 06/03/2013

## 2014-08-25 NOTE — Assessment & Plan Note (Signed)
stable overall by history and exam, recent data reviewed with pt, and pt to continue medical treatment as before,  to f/u any worsening symptoms or concerns BP Readings from Last 3 Encounters:  08/25/14 140/72  05/13/14 150/76  11/14/13 125/50

## 2014-08-25 NOTE — Progress Notes (Signed)
Subjective:    Patient ID: [name], female    DOB: 12/11/1927, 80 y.o.   MRN: [id number]  HPI  Here with acute visit, first visit to me as Dr Linda Hedges has recently retired. Seeing Dr Dwyane Dee for DM.  Today with specific co 2 mo left breast mass, NT, not really changing but not sure, no overlying skin change, just not going away, nothing seems to make better or worse.   Also here with a rather dramatic large enlarged erythem/indurated area at least 6-8 cm in diameter to distal left post upper leg, without ulceration, mild diffuse tender, firm but not hard, not able to appreciate fluctuance and no drainage, ongoing x 2 wks only.  No fever, trauma, or prior hx of same. No hx of DVT. Pt denies chest pain, increased sob or doe, wheezing, orthopnea, PND, increased LE swelling, palpitations, dizziness or syncope.  Has chronic stable trace to 1+ swelling only below the knees, no change.   Pt denies polydipsia, polyuria, or low sugar symptoms such as weakness or confusion improved with po intake.  Pt states overall good compliance with meds, trying to follow lower cholesterol, diabetic diet.  Past Medical History  Diagnosis Date  . Coronary artery disease   . Renal artery stenosis   . Hypertension   . Dyslipidemia   . GERD (gastroesophageal reflux disease)   . Tension headache   . Abdominal pain   . Anemia, unspecified   . Gout   . Back pain, chronic   . History of tonsillectomy   . History of cholecystectomy   . Depression   . IDDM (insulin dependent diabetes mellitus)   . Diabetes mellitus   . CHF (congestive heart failure)    Past Surgical History  Procedure Laterality Date  . Tonsillectomy    . Cholecystectomy  40 years ago    reports that she has never smoked. She has never used smokeless tobacco. She reports that she does not drink alcohol or use illicit drugs. family history includes Coronary artery disease in her father; Diabetes in her brother and sister; Heart attack in her  father; Hyperlipidemia in her brother, father, and sister; Hypertension in her brother, father, and sister. There is no history of Cancer. No Known Allergies Current Outpatient Prescriptions on File Prior to Visit  Medication Sig Dispense Refill  . allopurinol (ZYLOPRIM) 100 MG tablet Take 100 mg by mouth daily.    Marland Kitchen amLODipine (NORVASC) 5 MG tablet TAKE 1 TABLET BY MOUTH DAILY 90 tablet 1  . aspirin EC 81 MG tablet Take 81 mg by mouth daily.    Marland Kitchen atropine 1 % ophthalmic solution Place 1 drop into the left eye 2 (two) times daily.    . brimonidine (ALPHAGAN P) 0.1 % SOLN Apply 1 drop to eye 2 (two) times daily.    . citalopram (CELEXA) 20 MG tablet Take 1 tablet (20 mg total) by mouth daily. 30 tablet 11  . colchicine 0.6 MG tablet Take 1 tablet (0.6 mg total) by mouth 2 (two) times daily as needed (for gout flare up). 60 tablet 3  . diazepam (VALIUM) 5 MG tablet Take 5 mg by mouth every 6 (six) hours as needed for anxiety.    . dorzolamide-timolol (COSOPT) 22.3-6.8 MG/ML ophthalmic solution Place 1 drop into both eyes 2 (two) times daily.    . furosemide (LASIX) 20 MG tablet Take 3 tablets (60 mg total) by mouth daily. 90 tablet 1  . gabapentin (NEURONTIN) 600 MG tablet  Take 1 tablet (600 mg total) by mouth 2 (two) times daily. 180 tablet 3  . glucose blood (ACCU-CHEK SMARTVIEW) test strip Use as instructed to check blood sugars 2 times per day dx code 250.00 300 each 1  . hydrALAZINE (APRESOLINE) 100 MG tablet TAKE 1 TABLET BY MOUTH 3 TIMES A DAY 270 tablet 1  . insulin glargine (LANTUS) 100 UNIT/ML injection Inject 0.04 mLs (4 Units total) into the skin at bedtime. 30 mL 1  . insulin lispro (HUMALOG) 100 UNIT/ML injection Inject 10 Units into the skin 2 (two) times daily.    . metoprolol tartrate (LOPRESSOR) 25 MG tablet TAKE 3 TABLETS (75 MG TOTAL) BY MOUTH 2 (TWO) TIMES DAILY. 540 tablet 1  . nitroGLYCERIN (NITROSTAT) 0.4 MG SL tablet Place 0.4 mg under the tongue as needed. For chest pain.      . pantoprazole (PROTONIX) 40 MG tablet Take 1 tablet (40 mg total) by mouth daily. 90 tablet 3  . potassium chloride (KLOR-CON M10) 10 MEQ tablet Take 2 tablets (20 mEq total) by mouth daily. 60 tablet 1  . pravastatin (PRAVACHOL) 80 MG tablet Take 1 tablet (80 mg total) by mouth daily. 90 tablet 1  . [DISCONTINUED] potassium chloride (K-DUR) 10 MEQ tablet Take 2 tablets (20 mEq total) by mouth daily. 30 tablet 5   No current facility-administered medications on file prior to visit.    Review of Systems  Constitutional: Negative for unusual diaphoresis or other sweats  HENT: Negative for ringing in ear Eyes: Negative for double vision or worsening visual disturbance.  Respiratory: Negative for choking and stridor.   Gastrointestinal: Negative for vomiting or other signifcant bowel change Genitourinary: Negative for hematuria or decreased urine volume.  Musculoskeletal: Negative for other MSK pain or swelling Skin: Negative for color change and worsening wound.  Neurological: Negative for tremors and numbness other than noted  Psychiatric/Behavioral: Negative for decreased concentration or agitation other than above       Objective:   Physical Exam BP 140/72 mmHg  Pulse 73  Temp(Src) 98.8 F (37.1 C) (Oral)  Ht 5\' 3"  (1.6 m)  Wt 181 lb (82.101 kg)  BMI 32.07 kg/m2  SpO2 95% VS noted,  Constitutional: Pt appears well-developed, well-nourished.  HENT: Head: NCAT.  Right Ear: External ear normal.  Left Ear: External ear normal.  Eyes: . Pupils are equal, round, and reactive to light. Conjunctivae and EOM are normal Neck: Normal range of motion. Neck supple.  Left breast with approx 2 cm roundish/oval hard/firm mass left lower quadrant, no nipple d/c or overlying skin change Right breast with out mass, d/c or skin change Cardiovascular: Normal rate and regular rhythm.   Pulmonary/Chest: Effort normal and breath sounds without rales or hweezing Neurological: Pt is alert. Not  confused , motor grossly intact Skin: Skin is warm. No rash Psychiatric: Pt behavior is normal. No agitation.     Assessment & Plan:

## 2014-08-25 NOTE — Assessment & Plan Note (Signed)
Also a signficant issue today, seems high suspicious mass for possible malignancy, though no nipple d/c or skin change, for Diagnostic mammogra, as well as gen surgical referral

## 2014-08-26 ENCOUNTER — Other Ambulatory Visit: Payer: Self-pay | Admitting: Internal Medicine

## 2014-08-26 DIAGNOSIS — N6320 Unspecified lump in the left breast, unspecified quadrant: Secondary | ICD-10-CM

## 2014-09-04 ENCOUNTER — Ambulatory Visit
Admission: RE | Admit: 2014-09-04 | Discharge: 2014-09-04 | Disposition: A | Discharge status: Home / Self Care | Payer: Medicare Other | Source: Ambulatory Visit | Attending: Internal Medicine | Admitting: Internal Medicine

## 2014-09-04 DIAGNOSIS — R2242 Localized swelling, mass and lump, left lower limb: Secondary | ICD-10-CM

## 2014-09-04 MED ORDER — GADOBENATE DIMEGLUMINE 529 MG/ML IV SOLN
8.0000 mL | Freq: Once | INTRAVENOUS | Status: AC | PRN
  Administered 2014-09-04: 8 mL via INTRAVENOUS

## 2014-09-05 ENCOUNTER — Telehealth: Payer: Self-pay | Admitting: Internal Medicine

## 2014-09-05 DIAGNOSIS — R2242 Localized swelling, mass and lump, left lower limb: Secondary | ICD-10-CM

## 2014-09-05 NOTE — Telephone Encounter (Signed)
Spoke to pt by phone  Explained that without fever or elev WBC it seems unlikely that she has an infection  Will need speciality care for possible sarcoma - will refer [facility],   Pt states she will need to d/w her son who provides her transportation

## 2014-09-07 ENCOUNTER — Encounter: Payer: Self-pay | Admitting: Internal Medicine

## 2014-09-09 ENCOUNTER — Telehealth: Payer: Self-pay

## 2014-09-09 NOTE — Telephone Encounter (Signed)
[facility] nurse called and wanted to know if there could be verbals for continuation of education and support for CHF and Diabetes.

## 2014-09-12 ENCOUNTER — Other Ambulatory Visit: Payer: Self-pay | Admitting: Internal Medicine

## 2014-09-12 ENCOUNTER — Ambulatory Visit
Admission: RE | Admit: 2014-09-12 | Discharge: 2014-09-12 | Disposition: A | Discharge status: Home / Self Care | Payer: Medicare Other | Source: Ambulatory Visit | Attending: Internal Medicine | Admitting: Internal Medicine

## 2014-09-12 DIAGNOSIS — N6320 Unspecified lump in the left breast, unspecified quadrant: Secondary | ICD-10-CM

## 2014-09-13 ENCOUNTER — Other Ambulatory Visit: Payer: Self-pay

## 2014-09-13 MED ORDER — FUROSEMIDE 20 MG PO TABS: 60.0000 mg | ORAL_TABLET | Freq: Every day | ORAL | Status: DC

## 2014-09-13 NOTE — Telephone Encounter (Signed)
Okay to give verbal orders to continue education and support for CHF and diabetes.

## 2014-09-13 NOTE — Telephone Encounter (Signed)
Verbal okay given.  

## 2014-09-14 ENCOUNTER — Other Ambulatory Visit: Payer: Self-pay | Admitting: Internal Medicine

## 2014-09-14 ENCOUNTER — Telehealth: Payer: Self-pay | Admitting: *Deleted

## 2014-09-14 DIAGNOSIS — N6320 Unspecified lump in the left breast, unspecified quadrant: Secondary | ICD-10-CM

## 2014-09-14 NOTE — Telephone Encounter (Signed)
Received referral from [place].  Called pt to schedule a med onc appt and she requested for me to call her son to schedule the appt.  Called son and could not get through to him.  I will try again.

## 2014-09-15 ENCOUNTER — Telehealth: Payer: Self-pay

## 2014-09-15 DIAGNOSIS — R2242 Localized swelling, mass and lump, left lower limb: Secondary | ICD-10-CM

## 2014-09-15 NOTE — Telephone Encounter (Signed)
Film ordered  Robin to let pt know - needs xray done as soon as can

## 2014-09-15 NOTE — Telephone Encounter (Signed)
Please put in left femur xray asap per [facility] and [place] [facility] requires xray before appt. Patient has appt. At [facility] on 09/22/14.

## 2014-09-16 NOTE — Telephone Encounter (Signed)
Patient informed. 

## 2014-09-20 ENCOUNTER — Other Ambulatory Visit: Payer: Self-pay | Admitting: Endocrinology

## 2014-09-20 ENCOUNTER — Ambulatory Visit (INDEPENDENT_AMBULATORY_CARE_PROVIDER_SITE_OTHER)
Admission: RE | Admit: 2014-09-20 | Discharge: 2014-09-20 | Disposition: A | Discharge status: Home / Self Care | Payer: Medicare Other | Source: Ambulatory Visit | Attending: Internal Medicine | Admitting: Internal Medicine

## 2014-09-20 DIAGNOSIS — R2242 Localized swelling, mass and lump, left lower limb: Secondary | ICD-10-CM

## 2014-09-21 ENCOUNTER — Other Ambulatory Visit: Payer: Self-pay | Admitting: Internal Medicine

## 2014-09-21 ENCOUNTER — Other Ambulatory Visit: Payer: Self-pay

## 2014-09-21 ENCOUNTER — Telehealth: Payer: Self-pay | Admitting: *Deleted

## 2014-09-21 DIAGNOSIS — N6320 Unspecified lump in the left breast, unspecified quadrant: Secondary | ICD-10-CM

## 2014-09-21 NOTE — Telephone Encounter (Signed)
Called and spoke with pt's son [name] and confirmed 09/28/14 appt w/ him.  Mailed before appt letter, calendar, welcoming packet & intake form to pt.  Emailed Engineer, civil (consulting) at [employer] to make her aware.  Took paperwork to HIM to scan.  Added to spreadsheet.

## 2014-09-23 ENCOUNTER — Other Ambulatory Visit: Payer: Self-pay | Admitting: Internal Medicine

## 2014-09-23 ENCOUNTER — Ambulatory Visit
Admission: RE | Admit: 2014-09-23 | Discharge: 2014-09-23 | Disposition: A | Discharge status: Home / Self Care | Payer: Medicare Other | Source: Ambulatory Visit | Attending: Internal Medicine | Admitting: Internal Medicine

## 2014-09-23 DIAGNOSIS — N6320 Unspecified lump in the left breast, unspecified quadrant: Secondary | ICD-10-CM

## 2014-09-25 ENCOUNTER — Other Ambulatory Visit: Payer: Self-pay | Admitting: Endocrinology

## 2014-09-26 ENCOUNTER — Telehealth: Payer: Self-pay | Admitting: *Deleted

## 2014-09-26 NOTE — Telephone Encounter (Signed)
Pt's son [name] called stating that they wished to cancel the appt for pt due to her having some other health issues that needed to be taken care of first.  He will call back at a later date to reschedule.

## 2014-09-28 ENCOUNTER — Ambulatory Visit: Payer: Medicare Other | Admitting: Hematology and Oncology

## 2014-09-28 ENCOUNTER — Ambulatory Visit: Payer: Medicare Other

## 2014-09-29 ENCOUNTER — Other Ambulatory Visit: Payer: Self-pay | Admitting: Endocrinology

## 2014-10-18 DIAGNOSIS — J189 Pneumonia, unspecified organism: Secondary | ICD-10-CM | POA: Diagnosis not present

## 2014-10-18 DIAGNOSIS — R001 Bradycardia, unspecified: Secondary | ICD-10-CM | POA: Diagnosis not present

## 2014-10-18 DIAGNOSIS — I5033 Acute on chronic diastolic (congestive) heart failure: Secondary | ICD-10-CM | POA: Diagnosis not present

## 2014-10-18 DIAGNOSIS — E1140 Type 2 diabetes mellitus with diabetic neuropathy, unspecified: Secondary | ICD-10-CM | POA: Diagnosis not present

## 2014-10-18 DIAGNOSIS — Z794 Long term (current) use of insulin: Secondary | ICD-10-CM | POA: Diagnosis not present

## 2014-10-18 DIAGNOSIS — N184 Chronic kidney disease, stage 4 (severe): Secondary | ICD-10-CM | POA: Diagnosis not present

## 2014-10-18 DIAGNOSIS — M109 Gout, unspecified: Secondary | ICD-10-CM | POA: Diagnosis not present

## 2014-10-18 DIAGNOSIS — I129 Hypertensive chronic kidney disease with stage 1 through stage 4 chronic kidney disease, or unspecified chronic kidney disease: Secondary | ICD-10-CM | POA: Diagnosis not present

## 2014-10-18 DIAGNOSIS — D649 Anemia, unspecified: Secondary | ICD-10-CM | POA: Diagnosis not present

## 2014-10-18 DIAGNOSIS — E785 Hyperlipidemia, unspecified: Secondary | ICD-10-CM | POA: Diagnosis not present

## 2014-10-18 DIAGNOSIS — K219 Gastro-esophageal reflux disease without esophagitis: Secondary | ICD-10-CM | POA: Diagnosis not present

## 2014-10-20 DIAGNOSIS — H1851 Endothelial corneal dystrophy: Secondary | ICD-10-CM | POA: Diagnosis not present

## 2014-10-20 DIAGNOSIS — F0390 Unspecified dementia without behavioral disturbance: Secondary | ICD-10-CM | POA: Diagnosis not present

## 2014-10-20 DIAGNOSIS — F03918 Unspecified dementia, unspecified severity, with other behavioral disturbance: Secondary | ICD-10-CM | POA: Insufficient documentation

## 2014-10-20 DIAGNOSIS — N183 Chronic kidney disease, stage 3 unspecified: Secondary | ICD-10-CM | POA: Insufficient documentation

## 2014-10-20 DIAGNOSIS — F0391 Unspecified dementia with behavioral disturbance: Secondary | ICD-10-CM | POA: Insufficient documentation

## 2014-10-20 DIAGNOSIS — K219 Gastro-esophageal reflux disease without esophagitis: Secondary | ICD-10-CM | POA: Diagnosis not present

## 2014-10-20 DIAGNOSIS — E785 Hyperlipidemia, unspecified: Secondary | ICD-10-CM | POA: Diagnosis not present

## 2014-10-20 DIAGNOSIS — I2510 Atherosclerotic heart disease of native coronary artery without angina pectoris: Secondary | ICD-10-CM | POA: Diagnosis not present

## 2014-10-20 DIAGNOSIS — I10 Essential (primary) hypertension: Secondary | ICD-10-CM | POA: Diagnosis not present

## 2014-10-20 DIAGNOSIS — E139 Other specified diabetes mellitus without complications: Secondary | ICD-10-CM | POA: Diagnosis not present

## 2014-10-21 DIAGNOSIS — E139 Other specified diabetes mellitus without complications: Secondary | ICD-10-CM | POA: Diagnosis not present

## 2014-10-21 DIAGNOSIS — I129 Hypertensive chronic kidney disease with stage 1 through stage 4 chronic kidney disease, or unspecified chronic kidney disease: Secondary | ICD-10-CM | POA: Diagnosis not present

## 2014-10-21 DIAGNOSIS — D481 Neoplasm of uncertain behavior of connective and other soft tissue: Secondary | ICD-10-CM | POA: Insufficient documentation

## 2014-10-21 DIAGNOSIS — E785 Hyperlipidemia, unspecified: Secondary | ICD-10-CM | POA: Diagnosis not present

## 2014-10-21 DIAGNOSIS — F0390 Unspecified dementia without behavioral disturbance: Secondary | ICD-10-CM | POA: Diagnosis not present

## 2014-10-21 DIAGNOSIS — N183 Chronic kidney disease, stage 3 (moderate): Secondary | ICD-10-CM | POA: Diagnosis not present

## 2014-10-21 DIAGNOSIS — I2510 Atherosclerotic heart disease of native coronary artery without angina pectoris: Secondary | ICD-10-CM | POA: Diagnosis not present

## 2014-10-21 DIAGNOSIS — L928 Other granulomatous disorders of the skin and subcutaneous tissue: Secondary | ICD-10-CM | POA: Diagnosis not present

## 2014-10-21 DIAGNOSIS — M7989 Other specified soft tissue disorders: Secondary | ICD-10-CM | POA: Diagnosis not present

## 2014-10-21 DIAGNOSIS — K219 Gastro-esophageal reflux disease without esophagitis: Secondary | ICD-10-CM | POA: Diagnosis not present

## 2014-10-25 DIAGNOSIS — I5033 Acute on chronic diastolic (congestive) heart failure: Secondary | ICD-10-CM | POA: Diagnosis not present

## 2014-10-25 DIAGNOSIS — J189 Pneumonia, unspecified organism: Secondary | ICD-10-CM | POA: Diagnosis not present

## 2014-10-25 DIAGNOSIS — E1140 Type 2 diabetes mellitus with diabetic neuropathy, unspecified: Secondary | ICD-10-CM | POA: Diagnosis not present

## 2014-10-25 DIAGNOSIS — N184 Chronic kidney disease, stage 4 (severe): Secondary | ICD-10-CM | POA: Diagnosis not present

## 2014-10-25 DIAGNOSIS — D649 Anemia, unspecified: Secondary | ICD-10-CM | POA: Diagnosis not present

## 2014-10-25 DIAGNOSIS — E785 Hyperlipidemia, unspecified: Secondary | ICD-10-CM | POA: Diagnosis not present

## 2014-10-25 DIAGNOSIS — K219 Gastro-esophageal reflux disease without esophagitis: Secondary | ICD-10-CM | POA: Diagnosis not present

## 2014-10-25 DIAGNOSIS — M109 Gout, unspecified: Secondary | ICD-10-CM | POA: Diagnosis not present

## 2014-10-25 DIAGNOSIS — R001 Bradycardia, unspecified: Secondary | ICD-10-CM | POA: Diagnosis not present

## 2014-10-25 DIAGNOSIS — Z794 Long term (current) use of insulin: Secondary | ICD-10-CM | POA: Diagnosis not present

## 2014-10-25 DIAGNOSIS — I129 Hypertensive chronic kidney disease with stage 1 through stage 4 chronic kidney disease, or unspecified chronic kidney disease: Secondary | ICD-10-CM | POA: Diagnosis not present

## 2014-10-31 ENCOUNTER — Telehealth: Payer: Self-pay | Admitting: *Deleted

## 2014-10-31 NOTE — Telephone Encounter (Signed)
Received a request from [facility] that the pt is ready to schedule an appt w/ the Med Onc.  Called & left a message for her son [name]) to return my call so I can get her scheduled.

## 2014-11-01 ENCOUNTER — Other Ambulatory Visit: Payer: Self-pay | Admitting: Endocrinology

## 2014-11-01 DIAGNOSIS — J189 Pneumonia, unspecified organism: Secondary | ICD-10-CM | POA: Diagnosis not present

## 2014-11-01 DIAGNOSIS — I5033 Acute on chronic diastolic (congestive) heart failure: Secondary | ICD-10-CM | POA: Diagnosis not present

## 2014-11-01 DIAGNOSIS — E785 Hyperlipidemia, unspecified: Secondary | ICD-10-CM | POA: Diagnosis not present

## 2014-11-01 DIAGNOSIS — R001 Bradycardia, unspecified: Secondary | ICD-10-CM | POA: Diagnosis not present

## 2014-11-01 DIAGNOSIS — K219 Gastro-esophageal reflux disease without esophagitis: Secondary | ICD-10-CM | POA: Diagnosis not present

## 2014-11-01 DIAGNOSIS — Z794 Long term (current) use of insulin: Secondary | ICD-10-CM | POA: Diagnosis not present

## 2014-11-01 DIAGNOSIS — I129 Hypertensive chronic kidney disease with stage 1 through stage 4 chronic kidney disease, or unspecified chronic kidney disease: Secondary | ICD-10-CM | POA: Diagnosis not present

## 2014-11-01 DIAGNOSIS — N184 Chronic kidney disease, stage 4 (severe): Secondary | ICD-10-CM | POA: Diagnosis not present

## 2014-11-01 DIAGNOSIS — D649 Anemia, unspecified: Secondary | ICD-10-CM | POA: Diagnosis not present

## 2014-11-01 DIAGNOSIS — E1140 Type 2 diabetes mellitus with diabetic neuropathy, unspecified: Secondary | ICD-10-CM | POA: Diagnosis not present

## 2014-11-01 DIAGNOSIS — M109 Gout, unspecified: Secondary | ICD-10-CM | POA: Diagnosis not present

## 2014-11-02 ENCOUNTER — Ambulatory Visit (HOSPITAL_BASED_OUTPATIENT_CLINIC_OR_DEPARTMENT_OTHER): Payer: Medicare Other | Admitting: Hematology and Oncology

## 2014-11-02 ENCOUNTER — Telehealth: Payer: Self-pay | Admitting: Hematology and Oncology

## 2014-11-02 ENCOUNTER — Ambulatory Visit: Payer: Medicare Other

## 2014-11-02 ENCOUNTER — Encounter: Payer: Self-pay | Admitting: Hematology and Oncology

## 2014-11-02 VITALS — BP 137/54 | HR 57 | Temp 98.5°F | Resp 18 | Ht 63.0 in | Wt 201.9 lb

## 2014-11-02 DIAGNOSIS — N189 Chronic kidney disease, unspecified: Secondary | ICD-10-CM | POA: Diagnosis not present

## 2014-11-02 DIAGNOSIS — I509 Heart failure, unspecified: Secondary | ICD-10-CM | POA: Diagnosis not present

## 2014-11-02 DIAGNOSIS — C50412 Malignant neoplasm of upper-outer quadrant of left female breast: Secondary | ICD-10-CM | POA: Insufficient documentation

## 2014-11-02 DIAGNOSIS — D0512 Intraductal carcinoma in situ of left breast: Secondary | ICD-10-CM

## 2014-11-02 DIAGNOSIS — H409 Unspecified glaucoma: Secondary | ICD-10-CM

## 2014-11-02 DIAGNOSIS — E119 Type 2 diabetes mellitus without complications: Secondary | ICD-10-CM

## 2014-11-02 MED ORDER — ANASTROZOLE 1 MG PO TABS: 1.0000 mg | ORAL_TABLET | Freq: Every day | ORAL | Status: DC

## 2014-11-02 NOTE — Progress Notes (Signed)
Checked in new pt with no financial concerns prior to seeing the doctor.  Pt has Raquel's card for any billing or insurance questions or concerns.

## 2014-11-02 NOTE — Telephone Encounter (Signed)
per pof to sch pt appt-gave pt copy of sch °

## 2014-11-02 NOTE — Progress Notes (Signed)
[place] NOTE  Patient Care Team: Neena Rhymes, MD as PCP - General Elayne Snare, MD (Endocrinology) Burnell Blanks, MD (Cardiology) Clent Jacks, MD (Ophthalmology)  CHIEF COMPLAINTS/PURPOSE OF CONSULTATION:  Newly diagnosed Left breast DCIS  HISTORY OF PRESENTING ILLNESS:  [name] 81 y.o. female is here because of recent diagnosis of left breast DCIS. She felt a palpable nodule in the left breast in discuss it with her physician who referred her for mammogram. The mammogram revealed a large irregular mass measuring 6.7 x 4.5 cm involving the left breast. This was then biopsied on 09/23/2014 and it came back as DCIS. It was ER/PR positive. She had seen general surgery who did not recommend lumpectomy because of her poor performance status and her multiple comorbidities. She was referred to [place] to discuss pros and cons of starting antiestrogen therapy. 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 She has diabetes and heart disease including congestive heart failure. She also has chronic kidney disease.  I reviewed her records extensively and collaborated the history with the patient.  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 Goal is to control her disease as long as possible.    MEDICAL HISTORY:  Past Medical History  Diagnosis Date  . Coronary artery disease   . Renal artery stenosis   . Hypertension   . Dyslipidemia   . GERD (gastroesophageal reflux disease)   . Tension headache   . Abdominal pain   . Anemia,  unspecified   . Gout   . Back pain, chronic   . History of tonsillectomy   . History of cholecystectomy   . Depression   . IDDM (insulin dependent diabetes mellitus)   . Diabetes mellitus   . CHF (congestive heart failure)     SURGICAL HISTORY: Past Surgical History  Procedure Laterality Date  . Tonsillectomy    . Cholecystectomy  40 years ago    SOCIAL HISTORY: History   Social History  . Marital Status: Widowed    Spouse Name: N/A    Number of Children: 6  . Years of Education: N/A   Occupational History  . Retired    Social History Main Topics  . Smoking status: Never Smoker   . Smokeless tobacco: Never Used  . Alcohol Use: No  . Drug Use: No  . Sexual Activity: Not Currently   Other Topics Concern  . Not on file   Social History Narrative   Married over 50 years.   8th grade-went to work.      Physician Roster:    Jaclyn PrimeDwyane Dee   Cardo- McAlhaney   Opthal- Groat    FAMILY HISTORY: Family History  Problem Relation Age of Onset  . Coronary artery disease Father   . Hypertension Father   . Heart attack Father   . Hyperlipidemia Father   . Diabetes Sister   . Hypertension Sister   . Hyperlipidemia Sister   . Hypertension Brother   . Diabetes Brother   . Hyperlipidemia Brother   . Cancer Neg Hx  Breast and colon    ALLERGIES:  has No Known Allergies.  MEDICATIONS:  Current Outpatient Prescriptions  Medication Sig Dispense Refill  . allopurinol (ZYLOPRIM) 100 MG tablet Take 100 mg by mouth daily.    Marland Kitchen amLODipine (NORVASC) 5 MG tablet TAKE 1 TABLET BY MOUTH DAILY 90 tablet 1  . anastrozole (ARIMIDEX) 1 MG tablet Take 1 tablet (1 mg total) by mouth daily. 90 tablet 3  . aspirin EC 81 MG tablet Take 81 mg by mouth daily.    Marland Kitchen atropine 1 % ophthalmic solution Place 1 drop into the left eye 2 (two) times daily.    . brimonidine (ALPHAGAN P) 0.1 % SOLN Apply 1 drop to eye 2 (two) times daily.    . citalopram (CELEXA) 20 MG tablet Take 1 tablet  (20 mg total) by mouth daily. 30 tablet 11  . colchicine 0.6 MG tablet Take 1 tablet (0.6 mg total) by mouth 2 (two) times daily as needed (for gout flare up). 60 tablet 3  . diazepam (VALIUM) 5 MG tablet Take 5 mg by mouth every 6 (six) hours as needed for anxiety.    . dorzolamide-timolol (COSOPT) 22.3-6.8 MG/ML ophthalmic solution Place 1 drop into both eyes 2 (two) times daily.    . furosemide (LASIX) 20 MG tablet Take 3 tablets (60 mg total) by mouth daily. 90 tablet 3  . gabapentin (NEURONTIN) 600 MG tablet Take 1 tablet (600 mg total) by mouth 2 (two) times daily. 180 tablet 3  . glucose blood (ACCU-CHEK SMARTVIEW) test strip Use as instructed to check blood sugars 2 times per day dx code 250.00 300 each 1  . hydrALAZINE (APRESOLINE) 100 MG tablet TAKE 1 TABLET BY MOUTH 3 TIMES A DAY 270 tablet 1  . insulin glargine (LANTUS) 100 UNIT/ML injection Inject 0.04 mLs (4 Units total) into the skin at bedtime. 30 mL 1  . insulin lispro (HUMALOG) 100 UNIT/ML injection Inject 10 Units into the skin 2 (two) times daily.    Marland Kitchen levofloxacin (LEVAQUIN) 250 MG tablet Take 1 tablet (250 mg total) by mouth daily. 10 tablet 0  . metoprolol tartrate (LOPRESSOR) 25 MG tablet TAKE 3 TABLETS (75 MG TOTAL) BY MOUTH 2 (TWO) TIMES DAILY. 540 tablet 1  . nitroGLYCERIN (NITROSTAT) 0.4 MG SL tablet Place 0.4 mg under the tongue as needed. For chest pain.     . pantoprazole (PROTONIX) 40 MG tablet Take 1 tablet (40 mg total) by mouth daily. 90 tablet 3  . potassium chloride (KLOR-CON M10) 10 MEQ tablet Take 2 tablets (20 mEq total) by mouth daily. 60 tablet 1  . pravastatin (PRAVACHOL) 80 MG tablet Take 1 tablet (80 mg total) by mouth daily. 90 tablet 1  . [DISCONTINUED] potassium chloride (K-DUR) 10 MEQ tablet Take 2 tablets (20 mEq total) by mouth daily. 30 tablet 5   No current facility-administered medications for this visit.    REVIEW OF SYSTEMS:   Constitutional: Denies fevers, chills or abnormal night  sweats Eyes: watering of eyes, impaired vision Ears, nose, mouth, throat, and face: Denies mucositis or sore throat Respiratory: Denies cough, dyspnea or wheezes Cardiovascular: Denies palpitation, chest discomfort or lower extremity swelling Gastrointestinal:  Denies nausea, heartburn or change in bowel habits Skin: Denies abnormal skin rashes Lymphatics: Denies new lymphadenopathy or easy bruising Neurological:peripheral neuropathy Behavioral/Psych: Mood is stable, no new changes  Breast:palpable mass in the left breast All other systems were reviewed with the patient and are negative.  PHYSICAL EXAMINATION: ECOG PERFORMANCE STATUS:  3 - Symptomatic, >50% confined to bed  Filed Vitals:   11/02/14 1539  BP: 137/54  Pulse: 57  Temp: 98.5 F (36.9 C)  Resp: 18   Filed Weights   11/02/14 1539  Weight: 201 lb 14.4 oz (91.581 kg)    GENERAL:alert, no distress and comfortable SKIN: skin color, texture, turgor are normal, no rashes or significant lesions EYES: normal, conjunctiva are pink and non-injected, sclera clear OROPHARYNX:no exudate, no erythema and lips, buccal mucosa, and tongue normal  NECK: supple, thyroid normal size, non-tender, without nodularity LYMPH:  no palpable lymphadenopathy in the cervical, axillary or inguinal LUNGS: findings crackles at lung bases HEART: soft ejection murmur, chronic lower extremity edema ABDOMEN:abdomen soft, non-tender and normal bowel sounds Musculoskeletal:no cyanosis of digits and no clubbing  PSYCH: alert & oriented x 3 with fluent speech NEURO: no focal motor/sensory deficits BREAST:large palpable mass left breast at least 6-7 cm to physical exam. No palpable axillary or supraclavicular lymphadenopathy (exam performed in the presence of a chaperone)   LABORATORY DATA:  I have reviewed the data as listed Lab Results  Component Value Date   WBC 7.9 08/25/2014   HGB 11.3* 08/25/2014   HCT 36.0 08/25/2014   MCV 92.8 08/25/2014    PLT 240.0 08/25/2014   Lab Results  Component Value Date   NA 143 08/25/2014   K 4.1 08/25/2014   CL 109 08/25/2014   CO2 21 08/25/2014    RADIOGRAPHIC STUDIES: I have personally reviewed the radiological reports and agreed with the findings in the report.   ASSESSMENT AND PLAN:  Breast cancer of upper-outer quadrant of left female breast Left breast DCIS involving the papillary lesion ER 100% B 29% , grade 1 :  Pathology counseling: I discussed with the patient that significance of the diagnosis and the difference between DCIS and invasive ductal carcinoma. I also discussed the significance of ER and PR receptors and their  Implications for treatment.  Recommendation:patient is not a surgical candidate because of extensive comorbidities and hence we are recommending antiestrogen therapy to keep DCIS under control.  I discussed with the patient and her son that DCIS cannot be cured with antiestrogen therapy alone however we can control it for as long as possible.  Anastrozole counseling:We discussed the risks and benefits of anti-estrogen therapy with  Anastrozole. These include but not limited to insomnia, hot flashes, mood changes, vaginal dryness, bone density loss, and weight gain. Although rare, serious side effects including endometrial cancer, risk of blood clots were also discussed. Patient understands these risks and consented to starting treatment.   return to clinic in 3 months for follow-up.     All questions were answered. The patient knows to call the clinic with any problems, questions or concerns.    Rulon Eisenmenger, MD 5:11 PM

## 2014-11-02 NOTE — Assessment & Plan Note (Signed)
Left breast DCIS involving the papillary lesion ER 100% B 29% , grade 1 :  Pathology counseling: I discussed with the patient that significance of the diagnosis and the difference between DCIS and invasive ductal carcinoma. I also discussed the significance of ER and PR receptors and their  Implications for treatment.  Recommendation:patient is not a surgical candidate because of extensive comorbidities and hence we are recommending antiestrogen therapy to keep DCIS under control.  I discussed with the patient and her son that DCIS cannot be cured with antiestrogen therapy alone however we can control it for as long as possible.  Anastrozole counseling:We discussed the risks and benefits of anti-estrogen therapy with  Anastrozole. These include but not limited to insomnia, hot flashes, mood changes, vaginal dryness, bone density loss, and weight gain. Although rare, serious side effects including endometrial cancer, risk of blood clots were also discussed. Patient understands these risks and consented to starting treatment.   return to clinic in 3 months for follow-up.

## 2014-11-03 DIAGNOSIS — R2242 Localized swelling, mass and lump, left lower limb: Secondary | ICD-10-CM | POA: Diagnosis not present

## 2014-11-08 ENCOUNTER — Telehealth: Payer: Self-pay | Admitting: Internal Medicine

## 2014-11-08 NOTE — Telephone Encounter (Signed)
Ok for verbal 

## 2014-11-08 NOTE — Telephone Encounter (Signed)
[name] is requesting verbal order over the phone for home health

## 2014-11-08 NOTE — Telephone Encounter (Signed)
[facility]HRN informed 

## 2014-11-08 NOTE — Telephone Encounter (Signed)
[facility]N needs verbal order to continue seeing this patient.

## 2014-11-09 DIAGNOSIS — J189 Pneumonia, unspecified organism: Secondary | ICD-10-CM | POA: Diagnosis not present

## 2014-11-09 DIAGNOSIS — R001 Bradycardia, unspecified: Secondary | ICD-10-CM | POA: Diagnosis not present

## 2014-11-09 DIAGNOSIS — Z794 Long term (current) use of insulin: Secondary | ICD-10-CM | POA: Diagnosis not present

## 2014-11-09 DIAGNOSIS — E1140 Type 2 diabetes mellitus with diabetic neuropathy, unspecified: Secondary | ICD-10-CM | POA: Diagnosis not present

## 2014-11-09 DIAGNOSIS — N184 Chronic kidney disease, stage 4 (severe): Secondary | ICD-10-CM | POA: Diagnosis not present

## 2014-11-09 DIAGNOSIS — M109 Gout, unspecified: Secondary | ICD-10-CM | POA: Diagnosis not present

## 2014-11-09 DIAGNOSIS — K219 Gastro-esophageal reflux disease without esophagitis: Secondary | ICD-10-CM | POA: Diagnosis not present

## 2014-11-09 DIAGNOSIS — I5033 Acute on chronic diastolic (congestive) heart failure: Secondary | ICD-10-CM | POA: Diagnosis not present

## 2014-11-09 DIAGNOSIS — E785 Hyperlipidemia, unspecified: Secondary | ICD-10-CM | POA: Diagnosis not present

## 2014-11-09 DIAGNOSIS — I129 Hypertensive chronic kidney disease with stage 1 through stage 4 chronic kidney disease, or unspecified chronic kidney disease: Secondary | ICD-10-CM | POA: Diagnosis not present

## 2014-11-09 DIAGNOSIS — D649 Anemia, unspecified: Secondary | ICD-10-CM | POA: Diagnosis not present

## 2014-11-10 ENCOUNTER — Other Ambulatory Visit: Payer: Self-pay | Admitting: Internal Medicine

## 2014-11-16 DIAGNOSIS — M109 Gout, unspecified: Secondary | ICD-10-CM | POA: Diagnosis not present

## 2014-11-16 DIAGNOSIS — Z794 Long term (current) use of insulin: Secondary | ICD-10-CM | POA: Diagnosis not present

## 2014-11-16 DIAGNOSIS — N184 Chronic kidney disease, stage 4 (severe): Secondary | ICD-10-CM | POA: Diagnosis not present

## 2014-11-16 DIAGNOSIS — I129 Hypertensive chronic kidney disease with stage 1 through stage 4 chronic kidney disease, or unspecified chronic kidney disease: Secondary | ICD-10-CM | POA: Diagnosis not present

## 2014-11-16 DIAGNOSIS — R001 Bradycardia, unspecified: Secondary | ICD-10-CM | POA: Diagnosis not present

## 2014-11-16 DIAGNOSIS — K219 Gastro-esophageal reflux disease without esophagitis: Secondary | ICD-10-CM | POA: Diagnosis not present

## 2014-11-16 DIAGNOSIS — E1140 Type 2 diabetes mellitus with diabetic neuropathy, unspecified: Secondary | ICD-10-CM | POA: Diagnosis not present

## 2014-11-16 DIAGNOSIS — J189 Pneumonia, unspecified organism: Secondary | ICD-10-CM | POA: Diagnosis not present

## 2014-11-16 DIAGNOSIS — I5033 Acute on chronic diastolic (congestive) heart failure: Secondary | ICD-10-CM | POA: Diagnosis not present

## 2014-11-16 DIAGNOSIS — E785 Hyperlipidemia, unspecified: Secondary | ICD-10-CM | POA: Diagnosis not present

## 2014-11-16 DIAGNOSIS — D649 Anemia, unspecified: Secondary | ICD-10-CM | POA: Diagnosis not present

## 2014-11-22 DIAGNOSIS — E1140 Type 2 diabetes mellitus with diabetic neuropathy, unspecified: Secondary | ICD-10-CM | POA: Diagnosis not present

## 2014-11-22 DIAGNOSIS — I129 Hypertensive chronic kidney disease with stage 1 through stage 4 chronic kidney disease, or unspecified chronic kidney disease: Secondary | ICD-10-CM | POA: Diagnosis not present

## 2014-11-22 DIAGNOSIS — E785 Hyperlipidemia, unspecified: Secondary | ICD-10-CM | POA: Diagnosis not present

## 2014-11-22 DIAGNOSIS — J189 Pneumonia, unspecified organism: Secondary | ICD-10-CM | POA: Diagnosis not present

## 2014-11-22 DIAGNOSIS — I5033 Acute on chronic diastolic (congestive) heart failure: Secondary | ICD-10-CM | POA: Diagnosis not present

## 2014-11-22 DIAGNOSIS — D649 Anemia, unspecified: Secondary | ICD-10-CM | POA: Diagnosis not present

## 2014-11-22 DIAGNOSIS — Z794 Long term (current) use of insulin: Secondary | ICD-10-CM | POA: Diagnosis not present

## 2014-11-22 DIAGNOSIS — K219 Gastro-esophageal reflux disease without esophagitis: Secondary | ICD-10-CM | POA: Diagnosis not present

## 2014-11-22 DIAGNOSIS — N184 Chronic kidney disease, stage 4 (severe): Secondary | ICD-10-CM | POA: Diagnosis not present

## 2014-11-22 DIAGNOSIS — M109 Gout, unspecified: Secondary | ICD-10-CM | POA: Diagnosis not present

## 2014-11-22 DIAGNOSIS — R001 Bradycardia, unspecified: Secondary | ICD-10-CM | POA: Diagnosis not present

## 2014-11-25 ENCOUNTER — Other Ambulatory Visit: Payer: Self-pay | Admitting: *Deleted

## 2014-11-25 MED ORDER — FUROSEMIDE 20 MG PO TABS: 60.0000 mg | ORAL_TABLET | Freq: Every day | ORAL | Status: DC

## 2014-11-25 MED ORDER — CITALOPRAM HYDROBROMIDE 20 MG PO TABS
20.0000 mg | ORAL_TABLET | Freq: Every day | ORAL | Status: DC
Start: 2014-11-25 — End: 2014-11-29

## 2014-11-28 DIAGNOSIS — I5033 Acute on chronic diastolic (congestive) heart failure: Secondary | ICD-10-CM | POA: Diagnosis not present

## 2014-11-29 ENCOUNTER — Other Ambulatory Visit: Payer: Self-pay | Admitting: Internal Medicine

## 2014-11-29 MED ORDER — CITALOPRAM HYDROBROMIDE 20 MG PO TABS: 20.0000 mg | ORAL_TABLET | Freq: Every day | ORAL | Status: DC

## 2014-11-29 MED ORDER — FUROSEMIDE 20 MG PO TABS: 60.0000 mg | ORAL_TABLET | Freq: Every day | ORAL | Status: DC

## 2014-11-29 NOTE — Addendum Note (Signed)
Addended by: Earnstine Regal on: 11/29/2014 11:28 AM   Modules accepted: Orders

## 2014-11-30 ENCOUNTER — Other Ambulatory Visit: Payer: Self-pay | Admitting: Internal Medicine

## 2014-11-30 DIAGNOSIS — D649 Anemia, unspecified: Secondary | ICD-10-CM | POA: Diagnosis not present

## 2014-11-30 DIAGNOSIS — I5033 Acute on chronic diastolic (congestive) heart failure: Secondary | ICD-10-CM | POA: Diagnosis not present

## 2014-11-30 DIAGNOSIS — N184 Chronic kidney disease, stage 4 (severe): Secondary | ICD-10-CM | POA: Diagnosis not present

## 2014-11-30 DIAGNOSIS — I129 Hypertensive chronic kidney disease with stage 1 through stage 4 chronic kidney disease, or unspecified chronic kidney disease: Secondary | ICD-10-CM | POA: Diagnosis not present

## 2014-11-30 DIAGNOSIS — E1140 Type 2 diabetes mellitus with diabetic neuropathy, unspecified: Secondary | ICD-10-CM | POA: Diagnosis not present

## 2014-11-30 DIAGNOSIS — Z794 Long term (current) use of insulin: Secondary | ICD-10-CM | POA: Diagnosis not present

## 2014-11-30 DIAGNOSIS — K219 Gastro-esophageal reflux disease without esophagitis: Secondary | ICD-10-CM | POA: Diagnosis not present

## 2014-11-30 DIAGNOSIS — J189 Pneumonia, unspecified organism: Secondary | ICD-10-CM | POA: Diagnosis not present

## 2014-11-30 DIAGNOSIS — E785 Hyperlipidemia, unspecified: Secondary | ICD-10-CM | POA: Diagnosis not present

## 2014-11-30 DIAGNOSIS — M109 Gout, unspecified: Secondary | ICD-10-CM | POA: Diagnosis not present

## 2014-11-30 DIAGNOSIS — R001 Bradycardia, unspecified: Secondary | ICD-10-CM | POA: Diagnosis not present

## 2014-12-07 ENCOUNTER — Telehealth: Payer: Self-pay | Admitting: Internal Medicine

## 2014-12-07 DIAGNOSIS — D649 Anemia, unspecified: Secondary | ICD-10-CM | POA: Diagnosis not present

## 2014-12-07 DIAGNOSIS — M109 Gout, unspecified: Secondary | ICD-10-CM | POA: Diagnosis not present

## 2014-12-07 DIAGNOSIS — I5033 Acute on chronic diastolic (congestive) heart failure: Secondary | ICD-10-CM | POA: Diagnosis not present

## 2014-12-07 DIAGNOSIS — E1140 Type 2 diabetes mellitus with diabetic neuropathy, unspecified: Secondary | ICD-10-CM | POA: Diagnosis not present

## 2014-12-07 DIAGNOSIS — I129 Hypertensive chronic kidney disease with stage 1 through stage 4 chronic kidney disease, or unspecified chronic kidney disease: Secondary | ICD-10-CM | POA: Diagnosis not present

## 2014-12-07 DIAGNOSIS — J189 Pneumonia, unspecified organism: Secondary | ICD-10-CM | POA: Diagnosis not present

## 2014-12-07 DIAGNOSIS — R001 Bradycardia, unspecified: Secondary | ICD-10-CM | POA: Diagnosis not present

## 2014-12-07 DIAGNOSIS — N184 Chronic kidney disease, stage 4 (severe): Secondary | ICD-10-CM | POA: Diagnosis not present

## 2014-12-07 DIAGNOSIS — Z794 Long term (current) use of insulin: Secondary | ICD-10-CM | POA: Diagnosis not present

## 2014-12-07 DIAGNOSIS — E785 Hyperlipidemia, unspecified: Secondary | ICD-10-CM | POA: Diagnosis not present

## 2014-12-07 DIAGNOSIS — K219 Gastro-esophageal reflux disease without esophagitis: Secondary | ICD-10-CM | POA: Diagnosis not present

## 2014-12-07 NOTE — Telephone Encounter (Signed)
[name] w/[facility] reported that the patient passed out and fell.  She has a large bruise to her left upper thigh.  Her weight is gradually going up. [name] would like a call back.

## 2014-12-07 NOTE — Telephone Encounter (Signed)
Should go to ER for syncopal episode

## 2014-12-08 ENCOUNTER — Other Ambulatory Visit: Payer: Self-pay | Admitting: Internal Medicine

## 2014-12-08 NOTE — Telephone Encounter (Signed)
Called pt's nurse to discuss syncope episode. Nurse insisted on an OV. Scheduled an appt. For next Tuesday at 445pm.

## 2014-12-10 ENCOUNTER — Other Ambulatory Visit: Payer: Self-pay | Admitting: Endocrinology

## 2014-12-13 ENCOUNTER — Encounter: Payer: Self-pay | Admitting: Internal Medicine

## 2014-12-13 ENCOUNTER — Ambulatory Visit (INDEPENDENT_AMBULATORY_CARE_PROVIDER_SITE_OTHER): Payer: Medicare Other | Admitting: Internal Medicine

## 2014-12-13 ENCOUNTER — Other Ambulatory Visit (INDEPENDENT_AMBULATORY_CARE_PROVIDER_SITE_OTHER): Payer: Medicare Other

## 2014-12-13 VITALS — BP 130/80 | HR 56 | Temp 98.1°F | Resp 18

## 2014-12-13 DIAGNOSIS — R21 Rash and other nonspecific skin eruption: Secondary | ICD-10-CM | POA: Diagnosis not present

## 2014-12-13 DIAGNOSIS — I10 Essential (primary) hypertension: Secondary | ICD-10-CM

## 2014-12-13 DIAGNOSIS — N184 Chronic kidney disease, stage 4 (severe): Secondary | ICD-10-CM

## 2014-12-13 DIAGNOSIS — E109 Type 1 diabetes mellitus without complications: Secondary | ICD-10-CM

## 2014-12-13 DIAGNOSIS — T148 Other injury of unspecified body region: Secondary | ICD-10-CM | POA: Diagnosis not present

## 2014-12-13 DIAGNOSIS — I129 Hypertensive chronic kidney disease with stage 1 through stage 4 chronic kidney disease, or unspecified chronic kidney disease: Secondary | ICD-10-CM | POA: Diagnosis not present

## 2014-12-13 DIAGNOSIS — T148XXA Other injury of unspecified body region, initial encounter: Secondary | ICD-10-CM

## 2014-12-13 LAB — HEMOGLOBIN A1C: Hgb A1c MFr Bld: 6.3 % (ref 4.6–6.5)

## 2014-12-13 MED ORDER — NYSTATIN 100000 UNIT/GM EX POWD: CUTANEOUS | Status: AC

## 2014-12-13 NOTE — Progress Notes (Signed)
Pre visit review using our clinic review tool, if applicable. No additional management support is needed unless otherwise documented below in the visit note. 

## 2014-12-13 NOTE — Patient Instructions (Signed)
Please take all new medication as prescribed = the powder  Please continue all other medications as before, and refills have been done if requested.  Please have the pharmacy call with any other refills you may need.  Please continue your efforts at being more active, low cholesterol diet, and weight control  Please keep your appointments with your specialists as you may have planned  Please go to the LAB in the Basement (turn left off the elevator) for the tests to be done today  You will be contacted by phone if any changes need to be made immediately.  Otherwise, you will receive a letter about your results with an explanation, but please check with MyChart first.  Please remember to sign up for MyChart if you have not done so, as this will be important to you in the future with finding out test results, communicating by private email, and scheduling acute appointments online when needed.

## 2014-12-13 NOTE — Progress Notes (Signed)
Subjective:    Patient ID: [name], female    DOB: [birth year], 81 y.o.   MRN: [id number]  HPI  Here to f/u; overall doing ok,  Pt denies chest pain, increased sob or doe, wheezing, orthopnea, PND, increased LE swelling, palpitations, dizziness or syncope.  Pt denies polydipsia, polyuria, or low sugar symptoms such as weakness or confusion improved with po intake.  Pt denies new neurological symptoms such as new headache, or facial or extremity weakness or numbness.   Pt states overall good compliance with meds, has been trying to follow lower cholesterol, diabetic diet, with wt overall stable,  but little exercise however.  Asks for flu shot. And f/u recent fall with injury to left lateral hip, still tedner, bruised, seems to be tracking some down the leg. Fell feb 19 in kitchen, got off balance picking up a gallon of milk Past Medical History  Diagnosis Date  . Coronary artery disease   . Renal artery stenosis   . Hypertension   . Dyslipidemia   . GERD (gastroesophageal reflux disease)   . Tension headache   . Abdominal pain   . Anemia, unspecified   . Gout   . Back pain, chronic   . History of tonsillectomy   . History of cholecystectomy   . Depression   . IDDM (insulin dependent diabetes mellitus)   . Diabetes mellitus   . CHF (congestive heart failure)    Past Surgical History  Procedure Laterality Date  . Tonsillectomy    . Cholecystectomy  40 years ago    reports that she has never smoked. She has never used smokeless tobacco. She reports that she does not drink alcohol or use illicit drugs. family history includes Coronary artery disease in her father; Diabetes in her brother and sister; Heart attack in her father; Hyperlipidemia in her brother, father, and sister; Hypertension in her brother, father, and sister. There is no history of Cancer. No Known Allergies Current Outpatient Prescriptions on File Prior to Visit  Medication Sig Dispense Refill  . allopurinol  (ZYLOPRIM) 100 MG tablet Take 100 mg by mouth daily.    Marland Kitchen amLODipine (NORVASC) 5 MG tablet TAKE 1 TABLET BY MOUTH DAILY 90 tablet 1  . anastrozole (ARIMIDEX) 1 MG tablet Take 1 tablet (1 mg total) by mouth daily. 90 tablet 3  . aspirin EC 81 MG tablet Take 81 mg by mouth daily.    Marland Kitchen atropine 1 % ophthalmic solution Place 1 drop into the left eye 2 (two) times daily.    . brimonidine (ALPHAGAN P) 0.1 % SOLN Apply 1 drop to eye 2 (two) times daily.    . citalopram (CELEXA) 20 MG tablet Take 1 tablet (20 mg total) by mouth daily. 90 tablet 3  . colchicine 0.6 MG tablet Take 1 tablet (0.6 mg total) by mouth 2 (two) times daily as needed (for gout flare up). 60 tablet 3  . diazepam (VALIUM) 5 MG tablet Take 5 mg by mouth every 6 (six) hours as needed for anxiety.    . dorzolamide-timolol (COSOPT) 22.3-6.8 MG/ML ophthalmic solution Place 1 drop into both eyes 2 (two) times daily.    . furosemide (LASIX) 20 MG tablet Take 3 tablets (60 mg total) by mouth daily. 90 tablet 3  . gabapentin (NEURONTIN) 600 MG tablet Take 1 tablet (600 mg total) by mouth 2 (two) times daily. 180 tablet 3  . glucose blood (ACCU-CHEK SMARTVIEW) test strip Use as instructed to check blood sugars 2  times per day dx code 250.00 300 each 1  . hydrALAZINE (APRESOLINE) 100 MG tablet TAKE 1 TABLET BY MOUTH 3 TIMES A DAY 270 tablet 1  . insulin glargine (LANTUS) 100 UNIT/ML injection Inject 0.04 mLs (4 Units total) into the skin at bedtime. 30 mL 1  . insulin lispro (HUMALOG) 100 UNIT/ML injection Inject 10 Units into the skin 2 (two) times daily.    Marland Kitchen levofloxacin (LEVAQUIN) 250 MG tablet Take 1 tablet (250 mg total) by mouth daily. 10 tablet 0  . metoprolol tartrate (LOPRESSOR) 25 MG tablet TAKE 3 TABLETS (75 MG TOTAL) BY MOUTH 2 (TWO) TIMES DAILY. 540 tablet 1  . nitroGLYCERIN (NITROSTAT) 0.4 MG SL tablet Place 0.4 mg under the tongue as needed. For chest pain.     . pantoprazole (PROTONIX) 40 MG tablet Take 1 tablet (40 mg total)  by mouth daily. 90 tablet 3  . potassium chloride (KLOR-CON M10) 10 MEQ tablet Take 2 tablets (20 mEq total) by mouth daily. 180 tablet 0  . pravastatin (PRAVACHOL) 80 MG tablet Take 1 tablet (80 mg total) by mouth daily. 90 tablet 1  . [DISCONTINUED] potassium chloride (K-DUR) 10 MEQ tablet Take 2 tablets (20 mEq total) by mouth daily. 30 tablet 5   No current facility-administered medications on file prior to visit.   Review of Systems  Constitutional: Negative for unusual diaphoresis or other sweats  HENT: Negative for ringing in ear Eyes: Negative for double vision or worsening visual disturbance.  Respiratory: Negative for choking and stridor.   Gastrointestinal: Negative for vomiting or other signifcant bowel change Genitourinary: Negative for hematuria or decreased urine volume.  Musculoskeletal: Negative for other MSK pain or swelling Skin: Negative for color change and worsening wound.  Neurological: Negative for tremors and numbness other than noted  Psychiatric/Behavioral: Negative for decreased concentration or agitation other than above       Objective:   Physical Exam BP 130/80 mmHg  Pulse 56  Temp(Src) 98.1 F (36.7 C) (Oral)  Resp 18  SpO2 94% VS noted,  Constitutional: Pt appears well-developed, well-nourished.  HENT: Head: NCAT.  Right Ear: External ear normal.  Left Ear: External ear normal.  Eyes: . Pupils are equal, round, and reactive to light. Conjunctivae and EOM are normal Neck: Normal range of motion. Neck supple.  Cardiovascular: Normal rate and regular rhythm.   Pulmonary/Chest: Effort normal and breath sounds without rales or wheezing.  Abd:  Soft, NT, ND, + BS Neurological: Pt is alert. Not confused , motor grossly intact Skin: Skin is warm. No rash except groin erythema; does have bruised and raised hematoma to left lateral hip area, mild tender Psychiatric: Pt behavior is normal. No agitation.     Assessment & Plan:

## 2014-12-14 LAB — CBC WITH DIFFERENTIAL/PLATELET
BASOS PCT: 0.1 % (ref 0.0–3.0)
Basophils Absolute: 0 10*3/uL (ref 0.0–0.1)
Eosinophils Absolute: 0.2 10*3/uL (ref 0.0–0.7)
Eosinophils Relative: 2.6 % (ref 0.0–5.0)
HCT: 31.6 % — ABNORMAL LOW (ref 36.0–46.0)
HEMOGLOBIN: 10.3 g/dL — AB (ref 12.0–15.0)
LYMPHS PCT: 10.8 % — AB (ref 12.0–46.0)
Lymphs Abs: 0.8 10*3/uL (ref 0.7–4.0)
MCHC: 32.6 g/dL (ref 30.0–36.0)
MCV: 91.8 fl (ref 78.0–100.0)
Monocytes Absolute: 0.6 10*3/uL (ref 0.1–1.0)
Monocytes Relative: 7.9 % (ref 3.0–12.0)
Neutro Abs: 5.9 10*3/uL (ref 1.4–7.7)
Neutrophils Relative %: 78.6 % — ABNORMAL HIGH (ref 43.0–77.0)
Platelets: 178 10*3/uL (ref 150.0–400.0)
RBC: 3.45 Mil/uL — AB (ref 3.87–5.11)
RDW: 16.3 % — AB (ref 11.5–15.5)
WBC: 7.5 10*3/uL (ref 4.0–10.5)

## 2014-12-14 LAB — HEPATIC FUNCTION PANEL
ALK PHOS: 127 U/L — AB (ref 39–117)
ALT: 7 U/L (ref 0–35)
AST: 13 U/L (ref 0–37)
Albumin: 4 g/dL (ref 3.5–5.2)
Bilirubin, Direct: -0.2 mg/dL — ABNORMAL LOW (ref 0.0–0.3)
Total Bilirubin: 0.3 mg/dL (ref 0.2–1.2)
Total Protein: 7.7 g/dL (ref 6.0–8.3)

## 2014-12-14 LAB — LIPID PANEL
CHOLESTEROL: 209 mg/dL — AB (ref 0–200)
HDL: 30.6 mg/dL — AB (ref >39.00)
NonHDL: 178.4
Total CHOL/HDL Ratio: 7
Triglycerides: 246 mg/dL — ABNORMAL HIGH (ref 0.0–149.0)
VLDL: 49.2 mg/dL — ABNORMAL HIGH (ref 0.0–40.0)

## 2014-12-14 LAB — BASIC METABOLIC PANEL
BUN: 76 mg/dL — AB (ref 6–23)
CO2: 24 mEq/L (ref 19–32)
Calcium: 9.9 mg/dL (ref 8.4–10.5)
Chloride: 106 mEq/L (ref 96–112)
Creatinine, Ser: 2.52 mg/dL — ABNORMAL HIGH (ref 0.40–1.20)
GFR: 23.33 mL/min — AB (ref >60.00)
Glucose, Bld: 248 mg/dL — ABNORMAL HIGH (ref 70–99)
POTASSIUM: 4 meq/L (ref 3.5–5.1)
Sodium: 140 mEq/L (ref 135–145)

## 2014-12-14 LAB — TSH: TSH: 2.59 u[IU]/mL (ref 0.35–4.50)

## 2014-12-14 LAB — LDL CHOLESTEROL, DIRECT: Direct LDL: 122 mg/dL

## 2014-12-17 DIAGNOSIS — R21 Rash and other nonspecific skin eruption: Secondary | ICD-10-CM | POA: Insufficient documentation

## 2014-12-17 DIAGNOSIS — T148XXA Other injury of unspecified body region, initial encounter: Secondary | ICD-10-CM | POA: Insufficient documentation

## 2014-12-17 NOTE — Assessment & Plan Note (Signed)
Small, over left lateral hip area, c/w pt and family, ok for conservative tx,  to f/u any worsening symptoms or concerns, cont all current meds

## 2014-12-17 NOTE — Assessment & Plan Note (Signed)
stable overall by history and exam, recent data reviewed with pt, and pt to continue medical treatment as before,  to f/u any worsening symptoms or concerns BP Readings from Last 3 Encounters:  12/13/14 130/80  11/02/14 137/54  08/25/14 140/72

## 2014-12-17 NOTE — Assessment & Plan Note (Signed)
stable overall by history and exam, recent data reviewed with pt, and pt to continue medical treatment as before,  to f/u any worsening symptoms or concerns Lab Results  Component Value Date   CREATININE 2.52* 12/13/2014

## 2014-12-17 NOTE — Assessment & Plan Note (Signed)
Also for nystatin powder asd prn,  to f/u any worsening symptoms or concerns

## 2014-12-17 NOTE — Assessment & Plan Note (Signed)
stable overall by history and exam, recent data reviewed with pt, and pt to continue medical treatment as before,  to f/u any worsening symptoms or concerns Lab Results  Component Value Date   HGBA1C 6.3 12/13/2014

## 2014-12-21 DIAGNOSIS — J189 Pneumonia, unspecified organism: Secondary | ICD-10-CM | POA: Diagnosis not present

## 2014-12-21 DIAGNOSIS — Z794 Long term (current) use of insulin: Secondary | ICD-10-CM | POA: Diagnosis not present

## 2014-12-21 DIAGNOSIS — D649 Anemia, unspecified: Secondary | ICD-10-CM | POA: Diagnosis not present

## 2014-12-21 DIAGNOSIS — I129 Hypertensive chronic kidney disease with stage 1 through stage 4 chronic kidney disease, or unspecified chronic kidney disease: Secondary | ICD-10-CM | POA: Diagnosis not present

## 2014-12-21 DIAGNOSIS — E785 Hyperlipidemia, unspecified: Secondary | ICD-10-CM | POA: Diagnosis not present

## 2014-12-21 DIAGNOSIS — R001 Bradycardia, unspecified: Secondary | ICD-10-CM | POA: Diagnosis not present

## 2014-12-21 DIAGNOSIS — N184 Chronic kidney disease, stage 4 (severe): Secondary | ICD-10-CM | POA: Diagnosis not present

## 2014-12-21 DIAGNOSIS — M109 Gout, unspecified: Secondary | ICD-10-CM | POA: Diagnosis not present

## 2014-12-21 DIAGNOSIS — E1140 Type 2 diabetes mellitus with diabetic neuropathy, unspecified: Secondary | ICD-10-CM | POA: Diagnosis not present

## 2014-12-21 DIAGNOSIS — K219 Gastro-esophageal reflux disease without esophagitis: Secondary | ICD-10-CM | POA: Diagnosis not present

## 2014-12-21 DIAGNOSIS — I5033 Acute on chronic diastolic (congestive) heart failure: Secondary | ICD-10-CM | POA: Diagnosis not present

## 2014-12-28 ENCOUNTER — Other Ambulatory Visit: Payer: Self-pay | Admitting: Endocrinology

## 2014-12-28 ENCOUNTER — Telehealth: Payer: Self-pay

## 2014-12-28 DIAGNOSIS — E1140 Type 2 diabetes mellitus with diabetic neuropathy, unspecified: Secondary | ICD-10-CM | POA: Diagnosis not present

## 2014-12-28 DIAGNOSIS — D649 Anemia, unspecified: Secondary | ICD-10-CM | POA: Diagnosis not present

## 2014-12-28 DIAGNOSIS — R001 Bradycardia, unspecified: Secondary | ICD-10-CM | POA: Diagnosis not present

## 2014-12-28 DIAGNOSIS — I5033 Acute on chronic diastolic (congestive) heart failure: Secondary | ICD-10-CM | POA: Diagnosis not present

## 2014-12-28 DIAGNOSIS — Z794 Long term (current) use of insulin: Secondary | ICD-10-CM | POA: Diagnosis not present

## 2014-12-28 DIAGNOSIS — I129 Hypertensive chronic kidney disease with stage 1 through stage 4 chronic kidney disease, or unspecified chronic kidney disease: Secondary | ICD-10-CM | POA: Diagnosis not present

## 2014-12-28 DIAGNOSIS — N184 Chronic kidney disease, stage 4 (severe): Secondary | ICD-10-CM | POA: Diagnosis not present

## 2014-12-28 DIAGNOSIS — E785 Hyperlipidemia, unspecified: Secondary | ICD-10-CM | POA: Diagnosis not present

## 2014-12-28 DIAGNOSIS — M109 Gout, unspecified: Secondary | ICD-10-CM | POA: Diagnosis not present

## 2014-12-28 DIAGNOSIS — J189 Pneumonia, unspecified organism: Secondary | ICD-10-CM | POA: Diagnosis not present

## 2014-12-28 DIAGNOSIS — K219 Gastro-esophageal reflux disease without esophagitis: Secondary | ICD-10-CM | POA: Diagnosis not present

## 2014-12-28 MED ORDER — METOPROLOL TARTRATE 25 MG PO TABS: ORAL_TABLET | ORAL | Status: DC

## 2014-12-28 NOTE — Telephone Encounter (Signed)
Ok to decrease the metoprolol to 50 mg twice per day

## 2014-12-28 NOTE — Telephone Encounter (Signed)
Spoke with Bridgett who is a PA with [employer]. She was visiting the patient and was required to report that the patient has a resting heart rate of 40-45. Patient said that she felt fine and did not fine any different than normal. She is currently taking metoprolol 75mg  (25 mg tablets). She told the patient to refrain the patient until she heard back from [place]. I told her I would give the message to the doctor and will try to call back as soon as possible.

## 2014-12-29 NOTE — Telephone Encounter (Signed)
Pt. Is aware.

## 2015-01-04 DIAGNOSIS — E785 Hyperlipidemia, unspecified: Secondary | ICD-10-CM | POA: Diagnosis not present

## 2015-01-04 DIAGNOSIS — I5033 Acute on chronic diastolic (congestive) heart failure: Secondary | ICD-10-CM | POA: Diagnosis not present

## 2015-01-04 DIAGNOSIS — K219 Gastro-esophageal reflux disease without esophagitis: Secondary | ICD-10-CM | POA: Diagnosis not present

## 2015-01-04 DIAGNOSIS — M109 Gout, unspecified: Secondary | ICD-10-CM | POA: Diagnosis not present

## 2015-01-04 DIAGNOSIS — I129 Hypertensive chronic kidney disease with stage 1 through stage 4 chronic kidney disease, or unspecified chronic kidney disease: Secondary | ICD-10-CM | POA: Diagnosis not present

## 2015-01-04 DIAGNOSIS — E1140 Type 2 diabetes mellitus with diabetic neuropathy, unspecified: Secondary | ICD-10-CM | POA: Diagnosis not present

## 2015-01-04 DIAGNOSIS — D649 Anemia, unspecified: Secondary | ICD-10-CM | POA: Diagnosis not present

## 2015-01-04 DIAGNOSIS — Z794 Long term (current) use of insulin: Secondary | ICD-10-CM | POA: Diagnosis not present

## 2015-01-04 DIAGNOSIS — N184 Chronic kidney disease, stage 4 (severe): Secondary | ICD-10-CM | POA: Diagnosis not present

## 2015-01-04 DIAGNOSIS — J189 Pneumonia, unspecified organism: Secondary | ICD-10-CM | POA: Diagnosis not present

## 2015-01-04 DIAGNOSIS — R001 Bradycardia, unspecified: Secondary | ICD-10-CM | POA: Diagnosis not present

## 2015-01-08 DIAGNOSIS — I129 Hypertensive chronic kidney disease with stage 1 through stage 4 chronic kidney disease, or unspecified chronic kidney disease: Secondary | ICD-10-CM | POA: Diagnosis not present

## 2015-01-08 DIAGNOSIS — M109 Gout, unspecified: Secondary | ICD-10-CM | POA: Diagnosis not present

## 2015-01-08 DIAGNOSIS — J189 Pneumonia, unspecified organism: Secondary | ICD-10-CM | POA: Diagnosis not present

## 2015-01-08 DIAGNOSIS — K219 Gastro-esophageal reflux disease without esophagitis: Secondary | ICD-10-CM | POA: Diagnosis not present

## 2015-01-08 DIAGNOSIS — R001 Bradycardia, unspecified: Secondary | ICD-10-CM | POA: Diagnosis not present

## 2015-01-08 DIAGNOSIS — Z794 Long term (current) use of insulin: Secondary | ICD-10-CM | POA: Diagnosis not present

## 2015-01-08 DIAGNOSIS — N184 Chronic kidney disease, stage 4 (severe): Secondary | ICD-10-CM | POA: Diagnosis not present

## 2015-01-08 DIAGNOSIS — E785 Hyperlipidemia, unspecified: Secondary | ICD-10-CM | POA: Diagnosis not present

## 2015-01-08 DIAGNOSIS — D649 Anemia, unspecified: Secondary | ICD-10-CM | POA: Diagnosis not present

## 2015-01-08 DIAGNOSIS — I5033 Acute on chronic diastolic (congestive) heart failure: Secondary | ICD-10-CM | POA: Diagnosis not present

## 2015-01-08 DIAGNOSIS — E1140 Type 2 diabetes mellitus with diabetic neuropathy, unspecified: Secondary | ICD-10-CM | POA: Diagnosis not present

## 2015-01-09 ENCOUNTER — Other Ambulatory Visit: Payer: Self-pay | Admitting: *Deleted

## 2015-01-09 ENCOUNTER — Telehealth: Payer: Self-pay | Admitting: Endocrinology

## 2015-01-09 MED ORDER — AMLODIPINE BESYLATE 5 MG PO TABS: 5.0000 mg | ORAL_TABLET | Freq: Every day | ORAL | Status: DC

## 2015-01-09 MED ORDER — COLCHICINE 0.6 MG PO TABS: 0.6000 mg | ORAL_TABLET | Freq: Two times a day (BID) | ORAL | Status: AC | PRN

## 2015-01-09 MED ORDER — INSULIN GLARGINE 100 UNIT/ML ~~LOC~~ SOLN: 4.0000 [IU] | Freq: Every day | SUBCUTANEOUS | Status: DC

## 2015-01-09 MED ORDER — INSULIN LISPRO 100 UNIT/ML ~~LOC~~ SOLN: 10.0000 [IU] | Freq: Two times a day (BID) | SUBCUTANEOUS | Status: DC

## 2015-01-09 NOTE — Telephone Encounter (Signed)
rx sent for a 2 week supply, no further refills given until patient seen in office

## 2015-01-09 NOTE — Telephone Encounter (Signed)
Patient son ask if patient could have enough meds to carry her over to her next appt, 01-23-15, please advise

## 2015-01-11 ENCOUNTER — Other Ambulatory Visit: Payer: Self-pay

## 2015-01-11 MED ORDER — PANTOPRAZOLE SODIUM 40 MG PO TBEC: 40.0000 mg | DELAYED_RELEASE_TABLET | Freq: Every day | ORAL | Status: DC

## 2015-01-13 ENCOUNTER — Other Ambulatory Visit: Payer: Self-pay | Admitting: Endocrinology

## 2015-01-19 ENCOUNTER — Other Ambulatory Visit: Payer: Self-pay | Admitting: Endocrinology

## 2015-01-23 ENCOUNTER — Encounter: Payer: Self-pay | Admitting: Endocrinology

## 2015-01-23 ENCOUNTER — Ambulatory Visit (INDEPENDENT_AMBULATORY_CARE_PROVIDER_SITE_OTHER): Payer: Medicare Other | Admitting: Endocrinology

## 2015-01-23 VITALS — BP 134/68 | HR 68 | Temp 98.6°F | Ht 63.0 in | Wt 193.4 lb

## 2015-01-23 DIAGNOSIS — E1165 Type 2 diabetes mellitus with hyperglycemia: Secondary | ICD-10-CM | POA: Diagnosis not present

## 2015-01-23 DIAGNOSIS — N183 Chronic kidney disease, stage 3 unspecified: Secondary | ICD-10-CM

## 2015-01-23 DIAGNOSIS — I10 Essential (primary) hypertension: Secondary | ICD-10-CM | POA: Diagnosis not present

## 2015-01-23 DIAGNOSIS — IMO0002 Reserved for concepts with insufficient information to code with codable children: Secondary | ICD-10-CM

## 2015-01-23 MED ORDER — INSULIN LISPRO 100 UNIT/ML ~~LOC~~ SOLN: 10.0000 [IU] | Freq: Two times a day (BID) | SUBCUTANEOUS | Status: DC

## 2015-01-23 MED ORDER — INSULIN GLARGINE 100 UNIT/ML ~~LOC~~ SOLN: 6.0000 [IU] | Freq: Every day | SUBCUTANEOUS | Status: AC

## 2015-01-23 NOTE — Progress Notes (Signed)
Pre visit review using our clinic review tool, if applicable. No additional management support is needed unless otherwise documented below in the visit note. 

## 2015-01-23 NOTE — Progress Notes (Signed)
Patient ID: [name], female   DOB: 02/15/1928, 81 y.o.   MRN: [id number]     Reason for Appointment: Diabetes follow-up   History of Present Illness   Diagnosis: Type 2 DIABETES MELITUS, date of diagnosis: 1987         Past history: Has been on insulin since about 1996 for her diabetes and over the last year requiring relatively low doses for control. Oral hypoglycemic drugs have been stopped because of renal insufficiency However her blood sugars have been quite erratic even though her A1c is reasonably good at 7-8%  RECENT history   Patient has not been seen in follow-up for over a year She had previously been taking very small doses of both basal and bolus insulin Was not taking other drugs because of her renal dysfunction and minimizing cost More recently appears to be taking significantly larger amounts of Humalog but continuing only minimal doses of Lantus in the evening Hemoglobin A1c is falsely low because of her renal insufficiency and anemia No recent fructosamine available  She is unable to take care of herself much recently and is dependent on her sons to help her with diabetes management She has checked her blood sugar very infrequently also Current blood sugar patterns and problems:  She does not have any fasting readings  She is checking some readings at lunchtime and these are mildly increased, mostly under 200  Has a few readings around 5-7 PM which are mostly over 200  She has 1 documented low blood sugar of 58 in the early evening and she does not know why.  She tends to drink some regular soft drinks at times  She thinks she is taking her insulin before eating but is not giving a good history today; her son is drawing up insulin and she is giving it to her on her own  She may sometimes eat lunch but does not take insulin at this time  Insulin regimen: Lantus 4 units daily in pm, Humalog 4 units before meals Proper timing of medications  in relation to meals: Yes, usually.          Monitors blood glucose:  less than once a day.    Glucometer: Accucheck        Blood Glucose readings from meter review  PRE-MEAL Breakfast Lunch Dinner  7-8 PM  Overall  Glucose range:  194, 235, 189 221, 58 [phone] 225   Mean/median:     223    Hypoglycemia frequency:  ? ams          Meals:  2-3 meals per day. Sandwich at lunch at times      Physical activity:  none, she is coming in a wheelchair            Last exam with ophthalmologist: 12/04/12  Wt Readings from Last 3 Encounters:  01/23/15 193 lb 6 oz (87.714 kg)  11/02/14 201 lb 14.4 oz (91.581 kg)  08/25/14 181 lb (82.101 kg)   Lab Results  Component Value Date   HGBA1C 6.3 12/13/2014   HGBA1C 6.4 08/25/2014   HGBA1C 5.9 11/03/2013   Lab Results  Component Value Date   MICROALBUR 1.8 06/03/2013   LDLCALC 87 08/25/2014   CREATININE 2.52* 12/13/2014       Medication List       This list is accurate as of: 01/23/15  4:08 PM.  Always use your most recent med list.  allopurinol 100 MG tablet  Commonly known as:  ZYLOPRIM  Take 100 mg by mouth daily.     amLODipine 5 MG tablet  Commonly known as:  NORVASC  Take 1 tablet (5 mg total) by mouth daily.     anastrozole 1 MG tablet  Commonly known as:  ARIMIDEX  Take 1 tablet (1 mg total) by mouth daily.     aspirin EC 81 MG tablet  Take 81 mg by mouth daily.     atropine 1 % ophthalmic solution  Place 1 drop into the left eye 2 (two) times daily.     brimonidine 0.1 % Soln  Commonly known as:  ALPHAGAN P  Apply 1 drop to eye 2 (two) times daily.     citalopram 20 MG tablet  Commonly known as:  CELEXA  Take 1 tablet (20 mg total) by mouth daily.     colchicine 0.6 MG tablet  Take 1 tablet (0.6 mg total) by mouth 2 (two) times daily as needed (for gout flare up).     diazepam 5 MG tablet  Commonly known as:  VALIUM  Take 5 mg by mouth every 6 (six) hours as needed for anxiety.      dorzolamide-timolol 22.3-6.8 MG/ML ophthalmic solution  Commonly known as:  COSOPT  Place 1 drop into both eyes 2 (two) times daily.     furosemide 20 MG tablet  Commonly known as:  LASIX  Take 3 tablets (60 mg total) by mouth daily.     gabapentin 600 MG tablet  Commonly known as:  NEURONTIN  Take 1 tablet (600 mg total) by mouth 2 (two) times daily.     glucose blood test strip  Commonly known as:  ACCU-CHEK SMARTVIEW  Use as instructed to check blood sugars 2 times per day dx code 250.00     hydrALAZINE 100 MG tablet  Commonly known as:  APRESOLINE  TAKE 1 TABLET BY MOUTH 3 TIMES A DAY     insulin glargine 100 UNIT/ML injection  Commonly known as:  LANTUS  Inject 0.04 mLs (4 Units total) into the skin at bedtime.     insulin lispro 100 UNIT/ML injection  Commonly known as:  HUMALOG  Inject 0.1 mLs (10 Units total) into the skin 2 (two) times daily.     metoprolol tartrate 25 MG tablet  Commonly known as:  LOPRESSOR  TAKE 2 TABLETS (50 MG TOTAL) BY MOUTH 2 (TWO) TIMES DAILY.     nitroGLYCERIN 0.4 MG SL tablet  Commonly known as:  NITROSTAT  Place 0.4 mg under the tongue as needed. For chest pain.     nystatin powder  Commonly known as:  MYCOSTATIN  Use as directed     pantoprazole 40 MG tablet  Commonly known as:  PROTONIX  Take 1 tablet (40 mg total) by mouth daily.     potassium chloride 10 MEQ tablet  Commonly known as:  KLOR-CON M10  Take 2 tablets (20 mEq total) by mouth daily.     pravastatin 80 MG tablet  Commonly known as:  PRAVACHOL  Take 1 tablet (80 mg total) by mouth daily.        Allergies: No Known Allergies  Past Medical History  Diagnosis Date  . Coronary artery disease   . Renal artery stenosis   . Hypertension   . Dyslipidemia   . GERD (gastroesophageal reflux disease)   . Tension headache   . Abdominal pain   . Anemia, unspecified   .  Gout   . Back pain, chronic   . History of tonsillectomy   . History of cholecystectomy   .  Depression   . IDDM (insulin dependent diabetes mellitus)   . Diabetes mellitus   . CHF (congestive heart failure)     Past Surgical History  Procedure Laterality Date  . Tonsillectomy    . Cholecystectomy  40 years ago    Family History  Problem Relation Age of Onset  . Coronary artery disease Father   . Hypertension Father   . Heart attack Father   . Hyperlipidemia Father   . Diabetes Sister   . Hypertension Sister   . Hyperlipidemia Sister   . Hypertension Brother   . Diabetes Brother   . Hyperlipidemia Brother   . Cancer Neg Hx     Breast and colon    Social History:  reports that she has never smoked. She has never used smokeless tobacco. She reports that she does not drink alcohol or use illicit drugs.  Review of Systems:  HYPERTENSION:  she is on multiple drugs for hypertension and blood pressure is controlled  HYPERLIPIDEMIA: The lipid abnormality consists of elevated LDL and high triglycerides Has been on pravastatin long-term but has inadequate control  Lab Results  Component Value Date   CHOL 209* 12/13/2014   HDL 30.60* 12/13/2014   LDLCALC 87 08/25/2014   LDLDIRECT 122.0 12/13/2014   TRIG 246.0* 12/13/2014   CHOLHDL 7 12/13/2014    History of anemia:   Lab Results  Component Value Date   WBC 7.5 12/13/2014   HGB 10.3* 12/13/2014   HCT 31.6* 12/13/2014   MCV 91.8 12/13/2014   PLT 178.0 12/13/2014       Examination:   BP 134/68 mmHg  Pulse 68  Temp(Src) 98.6 F (37 C) (Oral)  Ht 5\' 3"  (1.6 m)  Wt 193 lb 6 oz (87.714 kg)  BMI 34.26 kg/m2  SpO2 96%  Body mass index is 34.26 kg/(m^2).    ASSESSMENT/ PLAN::   Diabetes type 2   The patient's diabetes control is difficult to assess as she is checking her blood sugar only occasionally and mostly around suppertime.  Not clear which readings are postprandial in the evening Also not sure if she is compliant with taking her mealtime insulin before eating Blood sugars are consistently high  around 5-6:00 PM reportedly before eating This may be because of getting inadequate Lantus insulin or inadequate coverage of lunch when she is eating of midday meal  Discussed with the patient and the son the following recommendations  Take periodic readings before breakfast as well as occasionally before lunch and bedtime.  Discussed blood sugar targets, considering her age and multiple medical problems and average blood sugar of 150-200 would be acceptable  Change Lantus to the morning and increase the dose at least 2 units.  May need to increase it further if fasting readings are consistently high  Avoid regular soft drinks  Reduce Humalog to 8 units to avoid hypoglycemia and start taking a dose before lunch when she is eating a midday meal  May adjust the dose further based on her postprandial readings  Patient to call if she is getting any hypoglycemia    if insulin requirement is reduced may consider using a combination of Tradgenta and Prandin instead of insulin, however not clear if she is able to get off insulin  She needs to followup with PCP for management of her anemia, optimal treatment of hyperlipidemia and renal  dysfunction Would recommend nephrology consultation   Patient Instructions  Lantus 6 units in am and none in pm  HUMALOG 8 units at all meals including at lunch ( none if not eating a meals)   Please check blood sugars at least half the time about 2 hours after any meal and 3 times per week on waking up.  Please bring blood sugar monitor to each visit.  Recommended blood sugar levels about 2 hours after meal is 140-180 and on waking up 100-150    Counseling time over 50% of today's 25 minute visit   Jemarcus Dougal 01/23/2015, 4:08 PM

## 2015-01-23 NOTE — Patient Instructions (Signed)
Lantus 6 units in am and none in pm  HUMALOG 8 units at all meals including at lunch ( none if not eating a meals)   Please check blood sugars at least half the time about 2 hours after any meal and 3 times per week on waking up.  Please bring blood sugar monitor to each visit.  Recommended blood sugar levels about 2 hours after meal is 140-180 and on waking up 100-150

## 2015-01-24 LAB — COMPREHENSIVE METABOLIC PANEL
ALBUMIN: 4.1 g/dL (ref 3.5–5.2)
ALK PHOS: 158 U/L — AB (ref 39–117)
ALT: 11 U/L (ref 0–35)
AST: 26 U/L (ref 0–37)
BUN: 55 mg/dL — ABNORMAL HIGH (ref 6–23)
CO2: 25 mEq/L (ref 19–32)
Calcium: 10.6 mg/dL — ABNORMAL HIGH (ref 8.4–10.5)
Chloride: 101 mEq/L (ref 96–112)
Creatinine, Ser: 2.44 mg/dL — ABNORMAL HIGH (ref 0.40–1.20)
GFR: 24.21 mL/min — ABNORMAL LOW (ref >60.00)
Glucose, Bld: 205 mg/dL — ABNORMAL HIGH (ref 70–99)
POTASSIUM: 4.2 meq/L (ref 3.5–5.1)
Sodium: 137 mEq/L (ref 135–145)
Total Bilirubin: 0.4 mg/dL (ref 0.2–1.2)
Total Protein: 8.3 g/dL (ref 6.0–8.3)

## 2015-01-24 LAB — FRUCTOSAMINE: Fructosamine: 364 umol/L — ABNORMAL HIGH (ref 0–285)

## 2015-02-02 ENCOUNTER — Other Ambulatory Visit: Payer: Self-pay | Admitting: Cardiovascular Disease

## 2015-02-06 ENCOUNTER — Telehealth: Payer: Self-pay | Admitting: Hematology and Oncology

## 2015-02-06 ENCOUNTER — Ambulatory Visit: Payer: Medicare Other | Admitting: Hematology and Oncology

## 2015-02-06 NOTE — Telephone Encounter (Signed)
John called in and left a message to cancel todays appointments as she is not feeling well

## 2015-02-16 ENCOUNTER — Other Ambulatory Visit: Payer: Self-pay

## 2015-02-16 ENCOUNTER — Other Ambulatory Visit: Payer: Self-pay | Admitting: Cardiovascular Disease

## 2015-02-17 ENCOUNTER — Telehealth: Payer: Self-pay | Admitting: Hematology and Oncology

## 2015-02-17 NOTE — Telephone Encounter (Signed)
Spoke with patients son and she is asleep and he will have her call us

## 2015-02-19 ENCOUNTER — Other Ambulatory Visit: Payer: Self-pay | Admitting: Cardiovascular Disease

## 2015-02-19 ENCOUNTER — Other Ambulatory Visit: Payer: Self-pay | Admitting: Endocrinology

## 2015-02-19 ENCOUNTER — Other Ambulatory Visit: Payer: Self-pay | Admitting: Internal Medicine

## 2015-02-20 ENCOUNTER — Ambulatory Visit: Payer: Medicare Other | Admitting: Endocrinology

## 2015-02-21 NOTE — Telephone Encounter (Signed)
Please advise on refills. Patient is overdue for follow up and it looks like her pcp changed the dose on metoprolol. Thanks, MI

## 2015-02-23 ENCOUNTER — Encounter: Payer: Self-pay | Admitting: Internal Medicine

## 2015-02-23 ENCOUNTER — Ambulatory Visit (INDEPENDENT_AMBULATORY_CARE_PROVIDER_SITE_OTHER): Payer: Medicare Other | Admitting: Internal Medicine

## 2015-02-23 ENCOUNTER — Other Ambulatory Visit (INDEPENDENT_AMBULATORY_CARE_PROVIDER_SITE_OTHER): Payer: Medicare Other

## 2015-02-23 VITALS — BP 122/60 | HR 54 | Temp 98.0°F

## 2015-02-23 DIAGNOSIS — I10 Essential (primary) hypertension: Secondary | ICD-10-CM

## 2015-02-23 DIAGNOSIS — E785 Hyperlipidemia, unspecified: Secondary | ICD-10-CM

## 2015-02-23 DIAGNOSIS — Z23 Encounter for immunization: Secondary | ICD-10-CM | POA: Diagnosis not present

## 2015-02-23 DIAGNOSIS — F03918 Unspecified dementia, unspecified severity, with other behavioral disturbance: Secondary | ICD-10-CM

## 2015-02-23 DIAGNOSIS — R7989 Other specified abnormal findings of blood chemistry: Secondary | ICD-10-CM

## 2015-02-23 DIAGNOSIS — Z0000 Encounter for general adult medical examination without abnormal findings: Secondary | ICD-10-CM | POA: Insufficient documentation

## 2015-02-23 DIAGNOSIS — F32A Depression, unspecified: Secondary | ICD-10-CM

## 2015-02-23 DIAGNOSIS — E139 Other specified diabetes mellitus without complications: Secondary | ICD-10-CM | POA: Diagnosis not present

## 2015-02-23 DIAGNOSIS — F329 Major depressive disorder, single episode, unspecified: Secondary | ICD-10-CM | POA: Diagnosis not present

## 2015-02-23 DIAGNOSIS — F0391 Unspecified dementia with behavioral disturbance: Secondary | ICD-10-CM

## 2015-02-23 LAB — CBC WITH DIFFERENTIAL/PLATELET
Basophils Absolute: 0 10*3/uL (ref 0.0–0.1)
Basophils Relative: 0.5 % (ref 0.0–3.0)
EOS PCT: 1 % (ref 0.0–5.0)
Eosinophils Absolute: 0.1 10*3/uL (ref 0.0–0.7)
HEMATOCRIT: 37.6 % (ref 36.0–46.0)
HEMOGLOBIN: 12.5 g/dL (ref 12.0–15.0)
LYMPHS ABS: 0.9 10*3/uL (ref 0.7–4.0)
Lymphocytes Relative: 14.1 % (ref 12.0–46.0)
MCHC: 33.2 g/dL (ref 30.0–36.0)
MCV: 91 fl (ref 78.0–100.0)
MONO ABS: 0.5 10*3/uL (ref 0.1–1.0)
Monocytes Relative: 8.6 % (ref 3.0–12.0)
NEUTROS ABS: 4.8 10*3/uL (ref 1.4–7.7)
Neutrophils Relative %: 75.8 % (ref 43.0–77.0)
PLATELETS: 148 10*3/uL — AB (ref 150.0–400.0)
RBC: 4.13 Mil/uL (ref 3.87–5.11)
RDW: 17.2 % — ABNORMAL HIGH (ref 11.5–15.5)
WBC: 6.3 10*3/uL (ref 4.0–10.5)

## 2015-02-23 LAB — LIPID PANEL
CHOLESTEROL: 240 mg/dL — AB (ref 0–200)
HDL: 35.7 mg/dL — AB (ref >39.00)
NonHDL: 204.3
TRIGLYCERIDES: 252 mg/dL — AB (ref 0.0–149.0)
Total CHOL/HDL Ratio: 7
VLDL: 50.4 mg/dL — ABNORMAL HIGH (ref 0.0–40.0)

## 2015-02-23 LAB — BASIC METABOLIC PANEL
BUN: 41 mg/dL — ABNORMAL HIGH (ref 6–23)
CALCIUM: 10.7 mg/dL — AB (ref 8.4–10.5)
CO2: 27 mEq/L (ref 19–32)
CREATININE: 2.04 mg/dL — AB (ref 0.40–1.20)
Chloride: 104 mEq/L (ref 96–112)
GFR: 29.76 mL/min — ABNORMAL LOW (ref >60.00)
Glucose, Bld: 64 mg/dL — ABNORMAL LOW (ref 70–99)
Potassium: 4 mEq/L (ref 3.5–5.1)
SODIUM: 140 meq/L (ref 135–145)

## 2015-02-23 LAB — HEPATIC FUNCTION PANEL
ALBUMIN: 3.9 g/dL (ref 3.5–5.2)
ALK PHOS: 140 U/L — AB (ref 39–117)
ALT: 9 U/L (ref 0–35)
AST: 15 U/L (ref 0–37)
BILIRUBIN DIRECT: 0.1 mg/dL (ref 0.0–0.3)
Total Bilirubin: 0.6 mg/dL (ref 0.2–1.2)
Total Protein: 7.7 g/dL (ref 6.0–8.3)

## 2015-02-23 LAB — LDL CHOLESTEROL, DIRECT: LDL DIRECT: 148 mg/dL

## 2015-02-23 LAB — TSH: TSH: 1.73 u[IU]/mL (ref 0.35–4.50)

## 2015-02-23 MED ORDER — FUROSEMIDE 20 MG PO TABS: 60.0000 mg | ORAL_TABLET | Freq: Every day | ORAL | Status: DC

## 2015-02-23 MED ORDER — CITALOPRAM HYDROBROMIDE 20 MG PO TABS: 20.0000 mg | ORAL_TABLET | Freq: Every day | ORAL | Status: DC

## 2015-02-23 MED ORDER — METOPROLOL TARTRATE 25 MG PO TABS: ORAL_TABLET | ORAL | Status: DC

## 2015-02-23 MED ORDER — PANTOPRAZOLE SODIUM 40 MG PO TBEC: 40.0000 mg | DELAYED_RELEASE_TABLET | Freq: Every day | ORAL | Status: DC

## 2015-02-23 MED ORDER — NITROGLYCERIN 0.4 MG SL SUBL: 0.4000 mg | SUBLINGUAL_TABLET | SUBLINGUAL | Status: DC | PRN

## 2015-02-23 MED ORDER — POTASSIUM CHLORIDE CRYS ER 10 MEQ PO TBCR: 20.0000 meq | EXTENDED_RELEASE_TABLET | Freq: Every day | ORAL | Status: DC

## 2015-02-23 MED ORDER — GABAPENTIN 600 MG PO TABS: 600.0000 mg | ORAL_TABLET | Freq: Two times a day (BID) | ORAL | Status: DC

## 2015-02-23 MED ORDER — QUETIAPINE FUMARATE 50 MG PO TABS: 50.0000 mg | ORAL_TABLET | Freq: Two times a day (BID) | ORAL | Status: DC

## 2015-02-23 NOTE — Addendum Note (Signed)
Addended by: Lowella Dandy on: 02/23/2015 11:15 AM   Modules accepted: Orders, Medications, SmartSet

## 2015-02-23 NOTE — Assessment & Plan Note (Signed)
stable overall by history and exam, recent data reviewed with pt, and pt to continue medical treatment as before,  to f/u any worsening symptoms or concerns Lab Results  Component Value Date   HGBA1C 6.3 12/13/2014

## 2015-02-23 NOTE — Assessment & Plan Note (Signed)
stable overall by history and exam, recent data reviewed with pt, and pt to continue medical treatment as before,  to f/u any worsening symptoms or concerns BP Readings from Last 3 Encounters:  02/23/15 122/60  01/23/15 134/68  12/13/14 130/80

## 2015-02-23 NOTE — Progress Notes (Signed)
Pre visit review using our clinic review tool, if applicable. No additional management support is needed unless otherwise documented below in the visit note. 

## 2015-02-23 NOTE — Progress Notes (Signed)
Subjective:    Patient ID: [name], female    DOB: [birth year], 81 y.o.   MRN: [id number]  HPI   Here for wellness and f/u;  Overall doing ok;  Pt denies Chest pain, worsening SOB, DOE, wheezing, orthopnea, PND, worsening LE edema, palpitations, dizziness or syncope.  Pt denies neurological change such as new headache, facial or extremity weakness.  Pt denies polydipsia, polyuria, or low sugar symptoms. Pt states overall good compliance with treatment and medications, good tolerability, and has been trying to follow appropriate diet.  Pt denies worsening depressive symptoms, suicidal ideation or panic. No fever, night sweats, wt loss, loss of appetite, or other constitutional symptoms.  Pt states good ability with ADL's, has low fall risk, home safety reviewed and adequate, no other significant changes in hearing or vision, and only occasionally active with exercise. having hallucinations recently in the setting of dementia per son. Pt lives with another son, but son here makes sure about meds and transportation. Pt may be more depressed lately as well Past Medical History  Diagnosis Date  . Coronary artery disease   . Renal artery stenosis   . Hypertension   . Dyslipidemia   . GERD (gastroesophageal reflux disease)   . Tension headache   . Abdominal pain   . Anemia, unspecified   . Gout   . Back pain, chronic   . History of tonsillectomy   . History of cholecystectomy   . Depression   . IDDM (insulin dependent diabetes mellitus)   . Diabetes mellitus   . CHF (congestive heart failure)    Past Surgical History  Procedure Laterality Date  . Tonsillectomy    . Cholecystectomy  40 years ago    reports that she has never smoked. She has never used smokeless tobacco. She reports that she does not drink alcohol or use illicit drugs. family history includes Coronary artery disease in her father; Diabetes in her brother and sister; Heart attack in her father; Hyperlipidemia in her  brother, father, and sister; Hypertension in her brother, father, and sister. There is no history of Cancer. No Known Allergies Current Outpatient Prescriptions on File Prior to Visit  Medication Sig Dispense Refill  . allopurinol (ZYLOPRIM) 100 MG tablet Take 100 mg by mouth daily.    Marland Kitchen amLODipine (NORVASC) 5 MG tablet Take 1 tablet (5 mg total) by mouth daily. 14 tablet 0  . amLODipine (NORVASC) 5 MG tablet TAKE 1 TABLET BY MOUTH DAILY 90 tablet 1  . anastrozole (ARIMIDEX) 1 MG tablet Take 1 tablet (1 mg total) by mouth daily. 90 tablet 3  . aspirin EC 81 MG tablet Take 81 mg by mouth daily.    Marland Kitchen atropine 1 % ophthalmic solution Place 1 drop into the left eye 2 (two) times daily.    . brimonidine (ALPHAGAN P) 0.1 % SOLN Apply 1 drop to eye 2 (two) times daily.    . colchicine 0.6 MG tablet Take 1 tablet (0.6 mg total) by mouth 2 (two) times daily as needed (for gout flare up). 14 tablet 0  . diazepam (VALIUM) 5 MG tablet Take 5 mg by mouth every 6 (six) hours as needed for anxiety.    . dorzolamide-timolol (COSOPT) 22.3-6.8 MG/ML ophthalmic solution Place 1 drop into both eyes 2 (two) times daily.    Marland Kitchen glucose blood (ACCU-CHEK SMARTVIEW) test strip Use as instructed to check blood sugars 2 times per day dx code 250.00 300 each 1  . hydrALAZINE (APRESOLINE) 100  MG tablet TAKE 1 TABLET BY MOUTH 3 TIMES A DAY 90 tablet 0  . insulin glargine (LANTUS) 100 UNIT/ML injection Inject 0.06 mLs (6 Units total) into the skin at bedtime. 30 mL 3  . insulin lispro (HUMALOG) 100 UNIT/ML injection Inject 0.1 mLs (10 Units total) into the skin 2 (two) times daily. 30 mL 3  . nystatin (MYCOSTATIN) powder Use as directed 30 g 1  . pravastatin (PRAVACHOL) 80 MG tablet Take 1 tablet (80 mg total) by mouth daily. 90 tablet 1  . [DISCONTINUED] potassium chloride (K-DUR) 10 MEQ tablet Take 2 tablets (20 mEq total) by mouth daily. 30 tablet 5   No current facility-administered medications on file prior to visit.     Review of Systems Not able due to dementia    Objective:   Physical Exam BP 122/60 mmHg  Pulse 54  Temp(Src) 98 F (36.7 C) (Oral)  SpO2 94% VS noted,  Constitutional: Pt is oriented to person only  Appears well-developed and well-nourished/obese in no significant distress Head: Normocephalic and atraumatic.  Right Ear: External ear normal.  Left Ear: External ear normal.  Nose: Nose normal.  Mouth/Throat: Oropharynx is clear and moist.  Eyes: Conjunctivae and EOM are normal. Pupils are equal, round, and reactive to light.  Neck: Normal range of motion. Neck supple. No JVD present. No tracheal deviation present or significant neck LA or mass Cardiovascular: Normal rate, regular rhythm, normal heart sounds and intact distal pulses.   Pulmonary/Chest: Effort normal and breath sounds without rales or wheezing  Abdominal: Soft. Bowel sounds are normal. NT. No HSM  Musculoskeletal: Normal range of motion. Exhibits trace bilat chronic ankle edema.  Lymphadenopathy:  Has no cervical adenopathy.  Neurological: Pt is alert and oriented to person, place, and time. Pt has normal reflexes. No cranial nerve deficit. Motor grossly intact Skin: Skin is warm and dry. No rash noted.  Psychiatric:  Has flat mood and affect. Behavior is normal. Not agitated      Assessment & Plan:

## 2015-02-23 NOTE — Patient Instructions (Addendum)
You had the new Prevnar pneumonia shot  Please take all new medication as prescribed - the seroquel  Please continue all other medications as before, and refills have been done if requested.  Please have the pharmacy call with any other refills you may need.  Please continue your efforts at being more active, low cholesterol diet, and weight control.  You are otherwise up to date with prevention measures today.  Please keep your appointments with your specialists as you may have planned  Please go to the LAB in the Basement (turn left off the elevator) for the tests to be done today  You will be contacted by phone if any changes need to be made immediately.  Otherwise, you will receive a letter about your results with an explanation, but please check with MyChart first.  Please remember to sign up for MyChart if you have not done so, as this will be important to you in the future with finding out test results, communicating by private email, and scheduling acute appointments online when needed.  Please return in 6 months, or sooner if needed

## 2015-02-23 NOTE — Assessment & Plan Note (Signed)

## 2015-02-23 NOTE — Assessment & Plan Note (Signed)
With behavioral disturbance. Hallucinations,, mild - for seroquel 50 bid

## 2015-02-23 NOTE — Addendum Note (Signed)
Addended by: Biagio Borg on: 02/23/2015 12:03 PM   Modules accepted: Orders, SmartSet

## 2015-02-23 NOTE — Assessment & Plan Note (Signed)
hopfully to be helped by seroquel as well, consider low dose zoloft

## 2015-02-24 NOTE — Telephone Encounter (Signed)
Too soon for refill hydralazine

## 2015-02-24 NOTE — Telephone Encounter (Signed)
Different dose of metoprolol filled by primary care yesterday.  Will forward hydralazine refill to primary care to see if they will refill.  Pt seen in primary care yesterday.  Has not been seen in our office since January 2015.

## 2015-02-26 ENCOUNTER — Encounter: Payer: Self-pay | Admitting: Internal Medicine

## 2015-02-26 ENCOUNTER — Other Ambulatory Visit: Payer: Self-pay | Admitting: Internal Medicine

## 2015-02-26 MED ORDER — PRAVASTATIN SODIUM 80 MG PO TABS: 80.0000 mg | ORAL_TABLET | Freq: Every day | ORAL | Status: DC

## 2015-02-27 ENCOUNTER — Other Ambulatory Visit: Payer: Self-pay

## 2015-02-27 MED ORDER — ALLOPURINOL 100 MG PO TABS: 100.0000 mg | ORAL_TABLET | Freq: Every day | ORAL | Status: DC

## 2015-03-04 ENCOUNTER — Other Ambulatory Visit: Payer: Self-pay

## 2015-03-08 NOTE — Progress Notes (Signed)
This encounter was created in error - please disregard.

## 2015-03-09 ENCOUNTER — Telehealth: Payer: Self-pay | Admitting: Hematology and Oncology

## 2015-03-09 NOTE — Telephone Encounter (Signed)
s.w. pt husband and advised on June appt....ok and aware

## 2015-04-06 ENCOUNTER — Other Ambulatory Visit: Payer: Self-pay | Admitting: *Deleted

## 2015-04-06 DIAGNOSIS — C50412 Malignant neoplasm of upper-outer quadrant of left female breast: Secondary | ICD-10-CM

## 2015-04-09 NOTE — Assessment & Plan Note (Signed)
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

## 2015-04-10 ENCOUNTER — Other Ambulatory Visit: Payer: Medicare Other

## 2015-04-10 ENCOUNTER — Telehealth: Payer: Self-pay | Admitting: Hematology and Oncology

## 2015-04-10 ENCOUNTER — Ambulatory Visit (HOSPITAL_BASED_OUTPATIENT_CLINIC_OR_DEPARTMENT_OTHER): Payer: Medicare Other | Admitting: Hematology and Oncology

## 2015-04-10 VITALS — BP 134/52 | HR 57 | Temp 98.9°F | Resp 18 | Ht 63.0 in | Wt 187.5 lb

## 2015-04-10 DIAGNOSIS — Z853 Personal history of malignant neoplasm of breast: Secondary | ICD-10-CM

## 2015-04-10 DIAGNOSIS — C50412 Malignant neoplasm of upper-outer quadrant of left female breast: Secondary | ICD-10-CM

## 2015-04-10 NOTE — Telephone Encounter (Signed)
Gav avs & calendar for Decemebr.

## 2015-04-10 NOTE — Progress Notes (Signed)
Patient Care Team: Biagio Borg, MD as PCP - General (Internal Medicine) Elayne Snare, MD (Endocrinology) Burnell Blanks, MD (Cardiology) Clent Jacks, MD (Ophthalmology)  DIAGNOSIS: No matching staging information was found for the patient.  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 Goal is to control her disease as long as possible.    CHIEF COMPLIANT: Follow-up anastrozole  INTERVAL HISTORY: [name] is a 81 year old with above-mentioned history left DCIS who was unresectable and is currently on albuterol treatment with anastrozole 1 mg daily. She appears to be tolerating it extremely well without any major problems or concerns. Denies any hot flashes or myalgias. Patient is visually blind. She requires wheelchair for ambulation. She has chronic leg edema.  REVIEW OF SYSTEMS:   Constitutional: Denies fevers, chills or abnormal weight loss Eyes: Denies blurriness of vision Ears, nose, mouth, throat, and face: Denies mucositis or sore throat Respiratory: Denies cough, dyspnea or wheezes Cardiovascular: Denies palpitation, chest discomfort; significant chronic lower extremity swelling Gastrointestinal:  Denies nausea, heartburn or change in bowel habits Skin: Denies abnormal skin rashes Lymphatics: Denies new lymphadenopathy or easy bruising Neurological:Denies numbness, tingling or new weaknesses Behavioral/Psych: Mood is stable, no new changes  Breast: Patient reports that the lump is smaller All other systems were reviewed with the patient and are negative.  I have reviewed the past medical  history, past surgical history, social history and family history with the patient and they are unchanged from previous note.  ALLERGIES:  has No Known Allergies.  MEDICATIONS:  Current Outpatient Prescriptions  Medication Sig Dispense Refill  . allopurinol (ZYLOPRIM) 100 MG tablet Take 1 tablet (100 mg total) by mouth daily. 180 tablet 3  . amLODipine (NORVASC) 5 MG tablet Take 1 tablet (5 mg total) by mouth daily. 14 tablet 0  . anastrozole (ARIMIDEX) 1 MG tablet Take 1 tablet (1 mg total) by mouth daily. 90 tablet 3  . aspirin EC 81 MG tablet Take 81 mg by mouth daily.    Marland Kitchen atropine 1 % ophthalmic solution Place 1 drop into the left eye 2 (two) times daily.    . brimonidine (ALPHAGAN P) 0.1 % SOLN Apply 1 drop to eye 2 (two) times daily.    . citalopram (CELEXA) 20 MG tablet Take 1 tablet (20 mg total) by mouth daily. 90 tablet 3  . colchicine 0.6 MG tablet Take 1 tablet (0.6 mg total) by mouth 2 (two) times daily as needed (for gout flare up). 14 tablet 0  . diazepam (VALIUM) 5 MG tablet Take 5 mg by mouth every 6 (six) hours as needed for anxiety.    . dorzolamide-timolol (COSOPT) 22.3-6.8 MG/ML ophthalmic solution Place 1 drop into both eyes 2 (two) times daily.    . furosemide (LASIX) 20 MG tablet Take 3 tablets (60 mg total) by mouth daily. 90 tablet 3  . gabapentin (NEURONTIN) 600 MG tablet Take 1 tablet (600 mg total) by mouth 2 (two) times daily. 180 tablet 3  . hydrALAZINE (APRESOLINE) 100 MG tablet TAKE 1 TABLET BY MOUTH 3 TIMES A DAY 90 tablet 0  . insulin glargine (LANTUS) 100  UNIT/ML injection Inject 0.06 mLs (6 Units total) into the skin at bedtime. 30 mL 3  . insulin lispro (HUMALOG) 100 UNIT/ML injection Inject 0.1 mLs (10 Units total) into the skin 2 (two) times daily. 30 mL 3  . metoprolol tartrate (LOPRESSOR) 25 MG tablet TAKE 2 TABLETS (50 MG TOTAL) BY MOUTH 2 (TWO) TIMES DAILY. 360 tablet 3  . nitroGLYCERIN (NITROSTAT) 0.4 MG SL tablet Place 1 tablet (0.4 mg total)  under the tongue as needed. For chest pain. 8 tablet 0  . nystatin (MYCOSTATIN) powder Use as directed 30 g 1  . pantoprazole (PROTONIX) 40 MG tablet Take 1 tablet (40 mg total) by mouth daily. 90 tablet 3  . potassium chloride (KLOR-CON M10) 10 MEQ tablet Take 2 tablets (20 mEq total) by mouth daily. 180 tablet 3  . pravastatin (PRAVACHOL) 80 MG tablet Take 1 tablet (80 mg total) by mouth daily. 90 tablet 3  . QUEtiapine (SEROQUEL) 50 MG tablet Take 1 tablet (50 mg total) by mouth 2 (two) times daily. 60 tablet 11  . [DISCONTINUED] potassium chloride (K-DUR) 10 MEQ tablet Take 2 tablets (20 mEq total) by mouth daily. 30 tablet 5   No current facility-administered medications for this visit.    PHYSICAL EXAMINATION: ECOG PERFORMANCE STATUS: 1 - Symptomatic but completely ambulatory  Filed Vitals:   04/10/15 0956  BP: 134/52  Pulse: 57  Temp: 98.9 F (37.2 C)  Resp: 18   Filed Weights   04/10/15 0956  Weight: 187 lb 8 oz (85.049 kg)    GENERAL:alert, no distress and comfortable SKIN: skin color, texture, turgor are normal, no rashes or significant lesions EYES: normal, Conjunctiva are pink and non-injected, sclera clear OROPHARYNX:no exudate, no erythema and lips, buccal mucosa, and tongue normal  NECK: supple, thyroid normal size, non-tender, without nodularity LYMPH:  no palpable lymphadenopathy in the cervical, axillary or inguinal LUNGS: clear to auscultation and percussion with normal breathing effort HEART: regular rate & rhythm and no murmurs and no lower extremity edema ABDOMEN:abdomen soft, non-tender and normal bowel sounds Musculoskeletal:no cyanosis of digits and no clubbing  NEURO: alert & oriented x 3 with fluent speech, no focal motor/sensory deficits BREAST: Left breast palpable lump measures 6 x 7 cm in size. (exam performed in the presence of a chaperone)  LABORATORY DATA:  I have reviewed the data as listed   Chemistry      Component Value Date/Time   NA  140 02/23/2015 1244   K 4.0 02/23/2015 1244   CL 104 02/23/2015 1244   CO2 27 02/23/2015 1244   BUN 41* 02/23/2015 1244   CREATININE 2.04* 02/23/2015 1244      Component Value Date/Time   CALCIUM 10.7* 02/23/2015 1244   ALKPHOS 140* 02/23/2015 1244   AST 15 02/23/2015 1244   ALT 9 02/23/2015 1244   BILITOT 0.6 02/23/2015 1244       Lab Results  Component Value Date   WBC 6.3 02/23/2015   HGB 12.5 02/23/2015   HCT 37.6 02/23/2015   MCV 91.0 02/23/2015   PLT 148.0* 02/23/2015   NEUTROABS 4.8 02/23/2015    ASSESSMENT & PLAN:  Breast cancer of upper-outer quadrant of left female breast 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 She denies any hot flashes or myalgias. Denies any bleeding symptoms.  Response to treatment: Clinically the mass appears to be 6-7 cm. Overall not a significant change from before. Patient does feel that it smaller than before.  Surveillance: Mammogram study be done in December and will follow-up after that  RTC 6 months after mammograms for follow-up   No orders of the defined types were placed in this encounter.   The patient has a good understanding of the overall plan. she agrees with it. she will call with any problems that may develop before the next visit here.   Rulon Eisenmenger, MD

## 2015-06-21 ENCOUNTER — Other Ambulatory Visit: Payer: Self-pay | Admitting: Internal Medicine

## 2015-08-15 ENCOUNTER — Other Ambulatory Visit: Payer: Self-pay | Admitting: Cardiovascular Disease

## 2015-08-16 ENCOUNTER — Other Ambulatory Visit: Payer: Self-pay

## 2015-08-16 MED ORDER — POTASSIUM CHLORIDE CRYS ER 10 MEQ PO TBCR: 20.0000 meq | EXTENDED_RELEASE_TABLET | Freq: Every day | ORAL | Status: DC

## 2015-08-16 NOTE — Telephone Encounter (Signed)
Done erx - potassium

## 2015-08-16 NOTE — Telephone Encounter (Signed)
Incoming fax for rf

## 2015-08-27 ENCOUNTER — Other Ambulatory Visit: Payer: Self-pay | Admitting: Cardiovascular Disease

## 2015-08-28 ENCOUNTER — Telehealth: Payer: Self-pay

## 2015-08-28 NOTE — Telephone Encounter (Signed)
Outreach to fup on apt with Dr. [name] to review for anemia and nephrology consult or other; This patient needs ua micro albumin to meet nephrology guidelines or referral to nephrologist; Note per Dr. Dwyane Dee appears to refer back to medical for fup regarding renal status. Call to the home and son answered; referred to brother [name] as he carries mother to apts. Left VM to return call to schedule [name] to see Dr. [name], Need Urine microalbumin prior to Nov 30th.

## 2015-08-29 NOTE — Telephone Encounter (Signed)
Ok to refill. cdm 

## 2015-09-11 ENCOUNTER — Other Ambulatory Visit: Payer: Self-pay | Admitting: Cardiovascular Disease

## 2015-09-17 ENCOUNTER — Other Ambulatory Visit: Payer: Self-pay | Admitting: Endocrinology

## 2015-09-20 IMAGING — MG MM DIGITAL DIAGNOSTIC UNILAT*L*
2 series · 2 of 2 positions shown · non-contrast
Comparison: Previous exams

CLINICAL DATA: 86-year-old female status post ultrasound-guided
left breast biopsy

EXAM:
DIAGNOSTIC LEFT MAMMOGRAM POST ULTRASOUND BIOPSY

[L ML]
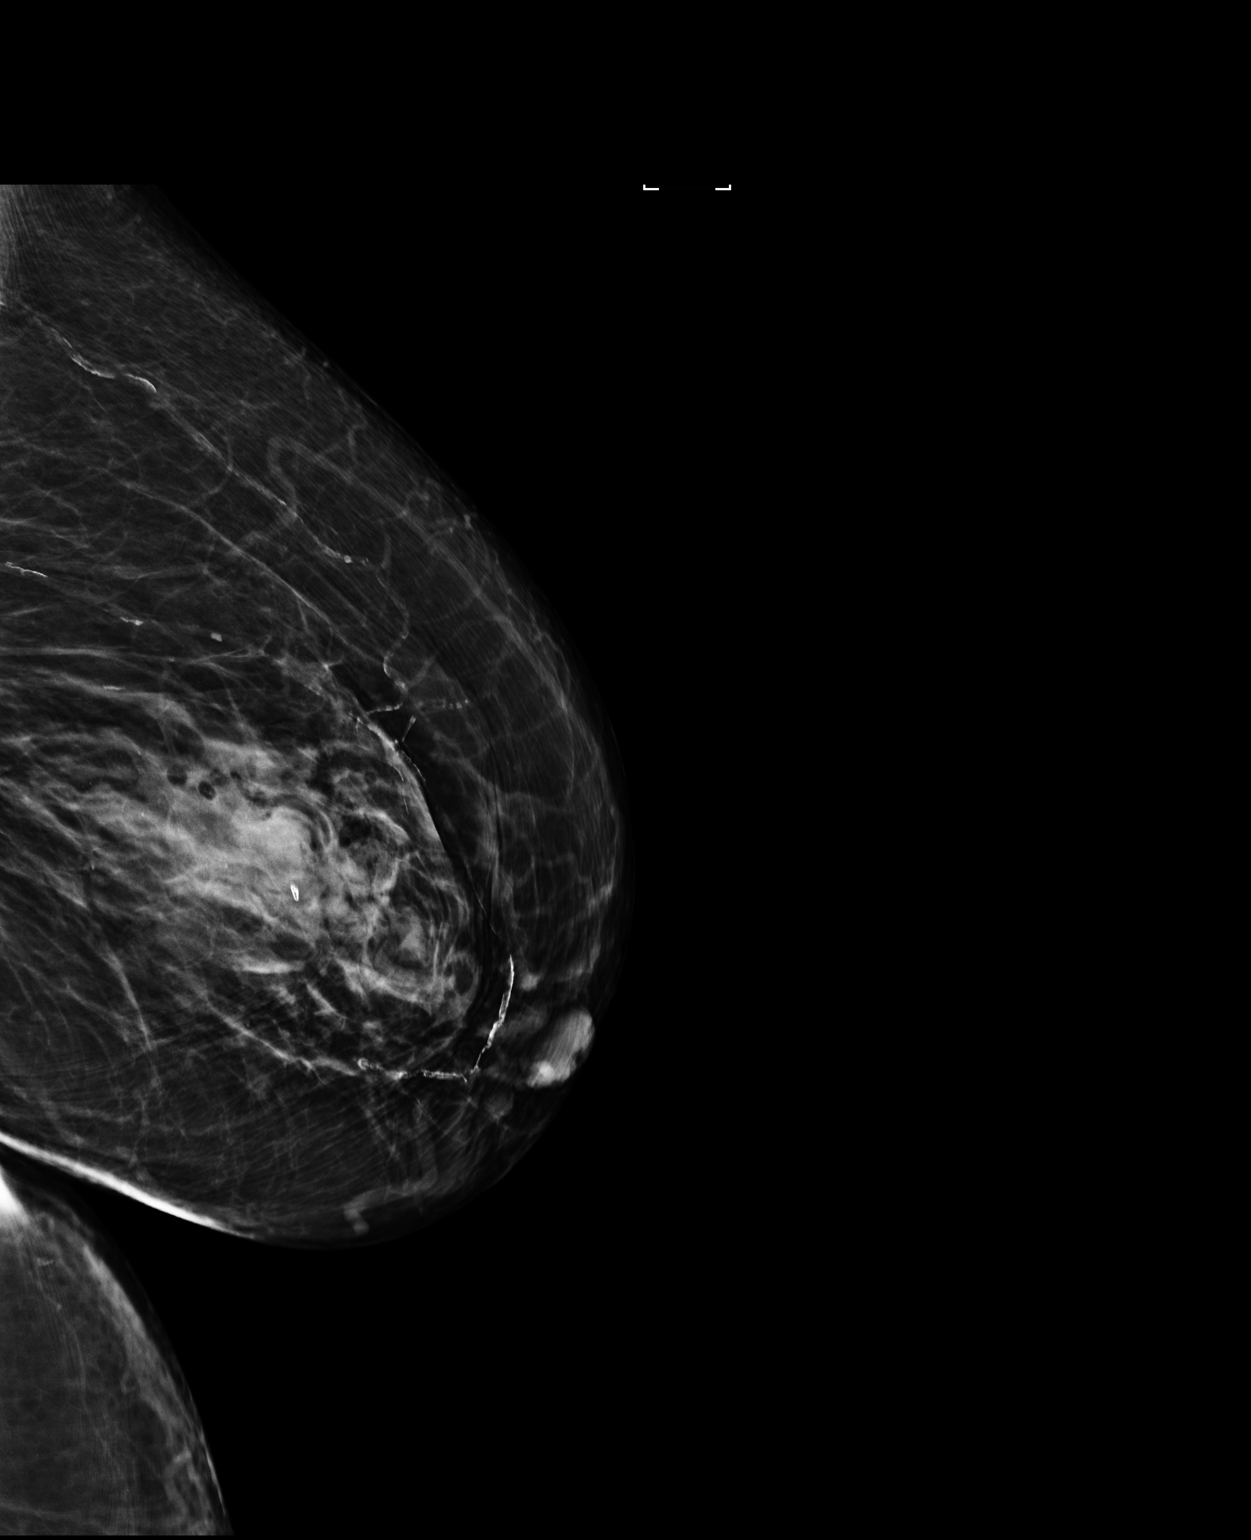

[L CC]
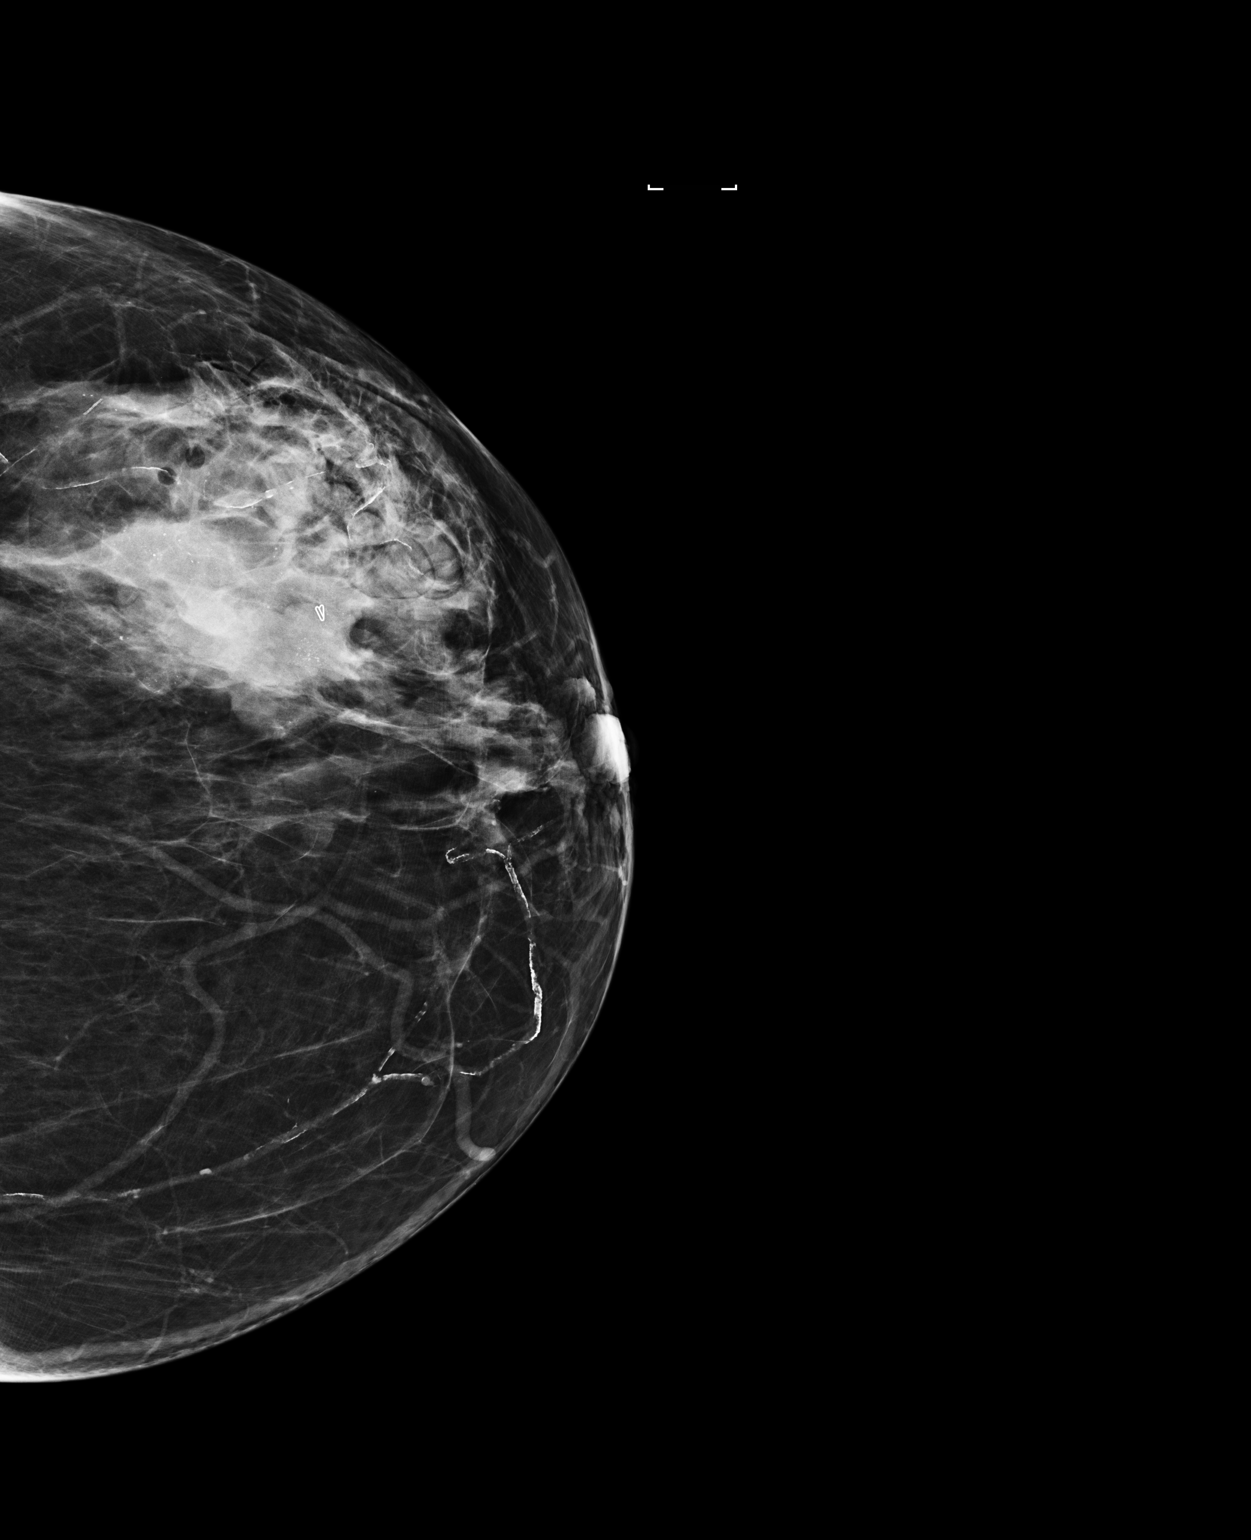

[2 of 2 positions shown; findings below may reference images not displayed]

FINDINGS: Mammographic images were obtained following ultrasound guided biopsy
of a suspicious left breast mass at 2 o'clock. Post biopsy images
confirm placement of a heart clip within the mass.
IMPRESSION: Satisfactory clip placement post ultrasound-guided left breast
biopsy.

Final Assessment: Post Procedure Mammograms for Marker Placement

## 2015-09-22 ENCOUNTER — Other Ambulatory Visit: Payer: Self-pay

## 2015-09-22 ENCOUNTER — Ambulatory Visit
Admission: RE | Admit: 2015-09-22 | Discharge: 2015-09-22 | Disposition: A | Discharge status: Home / Self Care | Payer: Medicare Other | Source: Ambulatory Visit | Attending: Hematology and Oncology | Admitting: Hematology and Oncology

## 2015-09-22 ENCOUNTER — Other Ambulatory Visit: Payer: Self-pay | Admitting: Hematology and Oncology

## 2015-09-22 DIAGNOSIS — C50412 Malignant neoplasm of upper-outer quadrant of left female breast: Secondary | ICD-10-CM

## 2015-10-08 ENCOUNTER — Other Ambulatory Visit: Payer: Self-pay | Admitting: Endocrinology

## 2015-10-12 NOTE — Assessment & Plan Note (Signed)
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 She denies any hot flashes or myalgias. Denies any bleeding symptoms. Response to treatment: Clinically the mass appears to be 6-7 cm. Overall not a significant change from before. Patient does feel that it smaller than before.  Surveillance: Mammogram and ultrasound December 2016 revealed stable findings. The left breast mass measures 6.7 cm and left axillary lymph node measures 3.6 cm. Both of these are unchanged  RTC 6 months for follow-up

## 2015-10-13 ENCOUNTER — Ambulatory Visit (HOSPITAL_BASED_OUTPATIENT_CLINIC_OR_DEPARTMENT_OTHER): Payer: Medicare Other | Admitting: Hematology and Oncology

## 2015-10-13 ENCOUNTER — Encounter: Payer: Self-pay | Admitting: Hematology and Oncology

## 2015-10-13 ENCOUNTER — Telehealth: Payer: Self-pay | Admitting: Hematology and Oncology

## 2015-10-13 VITALS — BP 190/78 | HR 58 | Temp 98.3°F | Resp 18 | Ht 63.0 in | Wt 186.5 lb

## 2015-10-13 DIAGNOSIS — C50412 Malignant neoplasm of upper-outer quadrant of left female breast: Secondary | ICD-10-CM | POA: Diagnosis not present

## 2015-10-13 NOTE — Telephone Encounter (Signed)
Appointments made and avs printed for patient °

## 2015-10-13 NOTE — Progress Notes (Signed)
Patient Care Team: Biagio Borg, MD as PCP - General (Internal Medicine) Elayne Snare, MD (Endocrinology) Burnell Blanks, MD (Cardiology) Clent Jacks, MD (Ophthalmology)  DIAGNOSIS: No matching staging information was found for the patient.  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 Goal is to control her disease as long as possible.   09/22/2015 Mammogram Known left breast cancer is unchanged in size mammographically measuring 6.7 x 4.5 cm, enlarged left axillary lymph node 3.6 cm also unchanged from before    CHIEF COMPLIANT: Follow up on anastrozole, breast cancer with lymphadenopathy  INTERVAL HISTORY: [name] is a 81 year old with above-mentioned history of large left breast cancer with enlarged axillary lymph node who is currently on palliative treatment with oral anastrozole. She is tolerating anastrozole extremely well. Recent mammogram and ultrasound revealed unchanged left breast mass and the left axillary lymph node. Complains of chronic back pain. Patient is wheelchair bound  REVIEW OF SYSTEMS:   Constitutional: Denies fevers, chills or abnormal weight loss Eyes: Denies blurriness of vision Ears, nose, mouth, throat, and face: Denies mucositis or sore throat Respiratory: Denies cough, dyspnea or wheezes Cardiovascular: Denies palpitation, chest discomfort Gastrointestinal:  Denies nausea, heartburn or change in bowel habits Skin: Denies abnormal skin rashes Lymphatics: Denies new lymphadenopathy or easy bruising Neurological:generalized weakness Behavioral/Psych: Mood is  stable, no new changes  Extremities: mild lower extremity edema Breast: mass in the left breast All other systems were reviewed with the patient and are negative.  I have reviewed the past medical history, past surgical history, social history and family history with the patient and they are unchanged from previous note.  ALLERGIES:  has No Known Allergies.  MEDICATIONS:  Current Outpatient Prescriptions  Medication Sig Dispense Refill  . allopurinol (ZYLOPRIM) 100 MG tablet Take 1 tablet (100 mg total) by mouth daily. 180 tablet 3  . amLODipine (NORVASC) 5 MG tablet Take 1 tablet (5 mg total) by mouth daily. 14 tablet 0  . anastrozole (ARIMIDEX) 1 MG tablet Take 1 tablet (1 mg total) by mouth daily. 90 tablet 3  . aspirin EC 81 MG tablet Take 81 mg by mouth daily.    Marland Kitchen atropine 1 % ophthalmic solution Place 1 drop into the left eye 2 (two) times daily.    . brimonidine (ALPHAGAN P) 0.1 % SOLN Apply 1 drop to eye 2 (two) times daily.    . citalopram (CELEXA) 20 MG tablet Take 1 tablet (20 mg total) by mouth daily. 90 tablet 3  . colchicine 0.6 MG tablet Take 1 tablet (0.6 mg total) by mouth 2 (two) times daily as needed (for gout flare up). 14 tablet 0  . diazepam (VALIUM) 5 MG tablet Take 5 mg by mouth every 6 (six) hours as needed for anxiety.    . dorzolamide-timolol (COSOPT) 22.3-6.8 MG/ML ophthalmic solution Place 1 drop into both eyes 2 (two) times daily.    . furosemide (LASIX) 20 MG tablet Take 3 tablets (60 mg total) by mouth daily. 90 tablet 3  . gabapentin (NEURONTIN) 600 MG tablet Take 1 tablet (600 mg total) by mouth 2 (two)  times daily. 180 tablet 3  . hydrALAZINE (APRESOLINE) 100 MG tablet TAKE 1 TABLET BY MOUTH 3 TIMES A DAY 90 tablet 0  . insulin glargine (LANTUS) 100 UNIT/ML injection Inject 0.06 mLs (6 Units total) into the skin at bedtime. 30 mL 3  . insulin lispro (HUMALOG) 100 UNIT/ML injection Inject 0.1 mLs (10 Units total) into the skin 2 (two) times daily. 30 mL 3    . metoprolol tartrate (LOPRESSOR) 25 MG tablet TAKE 2 TABLETS (50 MG TOTAL) BY MOUTH 2 (TWO) TIMES DAILY. 360 tablet 3  . nitroGLYCERIN (NITROSTAT) 0.4 MG SL tablet Place 1 tablet (0.4 mg total) under the tongue as needed. For chest pain. 8 tablet 0  . nystatin (MYCOSTATIN) powder Use as directed 30 g 1  . pantoprazole (PROTONIX) 40 MG tablet Take 1 tablet (40 mg total) by mouth daily. 90 tablet 3  . potassium chloride (KLOR-CON M10) 10 MEQ tablet Take 2 tablets (20 mEq total) by mouth daily. 180 tablet 1  . pravastatin (PRAVACHOL) 80 MG tablet Take 1 tablet (80 mg total) by mouth daily. 90 tablet 3  . QUEtiapine (SEROQUEL) 50 MG tablet Take 1 tablet (50 mg total) by mouth 2 (two) times daily. 60 tablet 11  . VITAMIN D HIGH POTENCY 1000 UNITS capsule TAKE ONE CAPSULE BY MOUTH DAILY 100 capsule 2  . [DISCONTINUED] potassium chloride (K-DUR) 10 MEQ tablet Take 2 tablets (20 mEq total) by mouth daily. 30 tablet 5   No current facility-administered medications for this visit.    PHYSICAL EXAMINATION: ECOG PERFORMANCE STATUS: 2 - Symptomatic, <50% confined to bed  Filed Vitals:   10/13/15 0825  BP: 190/78  Pulse: 58  Temp: 98.3 F (36.8 C)  Resp: 18   Filed Weights   10/13/15 0825  Weight: 186 lb 8 oz (84.596 kg)    GENERAL:alert, no distress and comfortable SKIN: skin color, texture, turgor are normal, no rashes or significant lesions EYES: normal, Conjunctiva are pink and non-injected, sclera clear OROPHARYNX:no exudate, no erythema and lips, buccal mucosa, and tongue normal  NECK: supple, thyroid normal size, non-tender, without nodularity LYMPH:  no palpable lymphadenopathy in the cervical, axillary or inguinal LUNGS: clear to auscultation and percussion with normal breathing effort HEART: regular rate & rhythm and no murmurs and no lower extremity edema ABDOMEN:abdomen soft, non-tender and normal bowel sounds MUSCULOSKELETAL:no cyanosis of digits and no clubbing  NEURO: alert  & oriented x 3 with fluent speech, no focal motor/sensory deficits EXTREMITIES: No lower extremity edema BREAST:palpable mass in the left breast and palpable left axillary lymph node. (exam performed in the presence of a chaperone)  LABORATORY DATA:  I have reviewed the data as listed   Chemistry      Component Value Date/Time   NA 140 02/23/2015 1244   K 4.0 02/23/2015 1244   CL 104 02/23/2015 1244   CO2 27 02/23/2015 1244   BUN 41* 02/23/2015 1244   CREATININE 2.04* 02/23/2015 1244      Component Value Date/Time   CALCIUM 10.7* 02/23/2015 1244   ALKPHOS 140* 02/23/2015 1244   AST 15 02/23/2015 1244   ALT 9 02/23/2015 1244   BILITOT 0.6 02/23/2015 1244       Lab Results  Component Value Date   WBC 6.3 02/23/2015   HGB 12.5 02/23/2015   HCT 37.6 02/23/2015   MCV 91.0 02/23/2015   PLT 148.0* 02/23/2015   NEUTROABS 4.8 02/23/2015     ASSESSMENT & PLAN:  Breast cancer of upper-outer  quadrant of left female breast 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 She denies any hot flashes or myalgias. Denies any bleeding symptoms. Response to treatment: Clinically the mass appears to be 6-7 cm. Overall not a significant change from before. Patient does feel that it smaller than before.  Surveillance: Mammogram and ultrasound December 2016 revealed stable findings. The left breast mass measures 6.7 cm and left axillary lymph node measures 3.6 cm. Both of these are unchanged  Hypertension: Blood pressure was systolic of 99991111. Patient for better take a blood pressure medication and she will go home and immediately take it.  RTC 6 months for follow-up   No orders of the defined types were placed in this encounter.   The patient has a good understanding of the overall  plan. she agrees with it. she will call with any problems that may develop before the next visit here.   Rulon Eisenmenger, MD 10/13/2015

## 2015-11-05 ENCOUNTER — Other Ambulatory Visit: Payer: Self-pay | Admitting: Endocrinology

## 2015-11-09 ENCOUNTER — Ambulatory Visit (INDEPENDENT_AMBULATORY_CARE_PROVIDER_SITE_OTHER): Payer: Medicare Other | Admitting: Internal Medicine

## 2015-11-09 ENCOUNTER — Encounter: Payer: Self-pay | Admitting: Internal Medicine

## 2015-11-09 ENCOUNTER — Other Ambulatory Visit (INDEPENDENT_AMBULATORY_CARE_PROVIDER_SITE_OTHER): Payer: Medicare Other

## 2015-11-09 VITALS — BP 118/66 | HR 54 | Temp 98.4°F | Resp 20 | Wt 187.0 lb

## 2015-11-09 DIAGNOSIS — Z0000 Encounter for general adult medical examination without abnormal findings: Secondary | ICD-10-CM

## 2015-11-09 DIAGNOSIS — Z23 Encounter for immunization: Secondary | ICD-10-CM

## 2015-11-09 DIAGNOSIS — E139 Other specified diabetes mellitus without complications: Secondary | ICD-10-CM

## 2015-11-09 DIAGNOSIS — I10 Essential (primary) hypertension: Secondary | ICD-10-CM

## 2015-11-09 LAB — BASIC METABOLIC PANEL
BUN: 36 mg/dL — ABNORMAL HIGH (ref 6–23)
CHLORIDE: 110 meq/L (ref 96–112)
CO2: 25 mEq/L (ref 19–32)
Calcium: 9.2 mg/dL (ref 8.4–10.5)
Creatinine, Ser: 2.01 mg/dL — ABNORMAL HIGH (ref 0.40–1.20)
GFR: 30.22 mL/min — ABNORMAL LOW (ref >60.00)
Glucose, Bld: 120 mg/dL — ABNORMAL HIGH (ref 70–99)
POTASSIUM: 4.4 meq/L (ref 3.5–5.1)
SODIUM: 141 meq/L (ref 135–145)

## 2015-11-09 LAB — URINALYSIS, ROUTINE W REFLEX MICROSCOPIC
BILIRUBIN URINE: NEGATIVE
HGB URINE DIPSTICK: NEGATIVE
Ketones, ur: NEGATIVE
Nitrite: NEGATIVE
Specific Gravity, Urine: 1.01 (ref 1.000–1.030)
TOTAL PROTEIN, URINE-UPE24: NEGATIVE
Urine Glucose: NEGATIVE
Urobilinogen, UA: 0.2 (ref 0.0–1.0)
pH: 6 (ref 5.0–8.0)

## 2015-11-09 LAB — HEMOGLOBIN A1C: Hgb A1c MFr Bld: 5.8 % (ref 4.6–6.5)

## 2015-11-09 LAB — CBC WITH DIFFERENTIAL/PLATELET
BASOS PCT: 0.2 % (ref 0.0–3.0)
Basophils Absolute: 0 10*3/uL (ref 0.0–0.1)
EOS ABS: 0.1 10*3/uL (ref 0.0–0.7)
EOS PCT: 1.6 % (ref 0.0–5.0)
HEMATOCRIT: 31.3 % — AB (ref 36.0–46.0)
Hemoglobin: 9.8 g/dL — ABNORMAL LOW (ref 12.0–15.0)
Lymphocytes Relative: 19.2 % (ref 12.0–46.0)
Lymphs Abs: 1.1 10*3/uL (ref 0.7–4.0)
MCHC: 31.4 g/dL (ref 30.0–36.0)
MCV: 92.1 fl (ref 78.0–100.0)
MONO ABS: 0.6 10*3/uL (ref 0.1–1.0)
Monocytes Relative: 10 % (ref 3.0–12.0)
NEUTROS ABS: 4.1 10*3/uL (ref 1.4–7.7)
Neutrophils Relative %: 69 % (ref 43.0–77.0)
PLATELETS: 152 10*3/uL (ref 150.0–400.0)
RBC: 3.4 Mil/uL — ABNORMAL LOW (ref 3.87–5.11)
RDW: 16.7 % — AB (ref 11.5–15.5)
WBC: 6 10*3/uL (ref 4.0–10.5)

## 2015-11-09 LAB — HEPATIC FUNCTION PANEL
ALT: 6 U/L (ref 0–35)
AST: 9 U/L (ref 0–37)
Albumin: 3.6 g/dL (ref 3.5–5.2)
Alkaline Phosphatase: 106 U/L (ref 39–117)
BILIRUBIN DIRECT: 0 mg/dL (ref 0.0–0.3)
BILIRUBIN TOTAL: 0.2 mg/dL (ref 0.2–1.2)
Total Protein: 6.6 g/dL (ref 6.0–8.3)

## 2015-11-09 LAB — MICROALBUMIN / CREATININE URINE RATIO
CREATININE, U: 119.9 mg/dL
MICROALB UR: 6.6 mg/dL — AB (ref 0.0–1.9)
MICROALB/CREAT RATIO: 5.5 mg/g (ref 0.0–30.0)

## 2015-11-09 LAB — LIPID PANEL
CHOLESTEROL: 125 mg/dL (ref 0–200)
HDL: 35.9 mg/dL — ABNORMAL LOW (ref >39.00)
LDL Cholesterol: 67 mg/dL (ref 0–99)
NonHDL: 88.93
TRIGLYCERIDES: 110 mg/dL (ref 0.0–149.0)
Total CHOL/HDL Ratio: 3
VLDL: 22 mg/dL (ref 0.0–40.0)

## 2015-11-09 LAB — TSH: TSH: 1.17 u[IU]/mL (ref 0.35–4.50)

## 2015-11-09 MED ORDER — PANTOPRAZOLE SODIUM 40 MG PO TBEC: 40.0000 mg | DELAYED_RELEASE_TABLET | Freq: Every day | ORAL | Status: DC

## 2015-11-09 MED ORDER — DIAZEPAM 5 MG PO TABS: 5.0000 mg | ORAL_TABLET | Freq: Four times a day (QID) | ORAL | Status: DC | PRN

## 2015-11-09 NOTE — Progress Notes (Signed)
Subjective:    Patient ID: [name], female    DOB: 23-Feb-1928, 82 y.o.   MRN: [id number]  HPI   Here for wellness and f/u with son;  Overall doing ok;  Pt with dementia and hx is difficult but seems to denies Chest pain, worsening SOB, DOE, wheezing, orthopnea, PND, worsening LE edema, palpitations, dizziness or syncope.  Pt denies neurological change such as new headache, facial or extremity weakness.  Pt denies polydipsia, polyuria, or low sugar symptoms. Pt states overall good compliance with treatment and medications as family makes sure, seems good tolerability.  Pt denies worsening depressive symptoms, suicidal ideation or panic. No fever, night sweats, wt loss, loss of appetite, or other constitutional symptoms.  Pt states fair ability with ADL's, has low to mod fall risk, home safety reviewed and adequate, no other significant changes in hearing or vision, and remains limited to bed to chair.  Has not taken the lasix for 3 mo, no s/s volume increased, not clear why son felt she did not need this. (son who lives with her not here today). Dementia wax and wanes with some occas hallucinations, o/w behavior ok.  Lives at home, has walker at home, lives with a son, (a second son is here today with her, who provides her meds and any transportation) Due flu shot today Past Medical History  Diagnosis Date  . Coronary artery disease   . Renal artery stenosis ([place])   . Hypertension   . Dyslipidemia   . GERD (gastroesophageal reflux disease)   . Tension headache   . Abdominal pain   . Anemia, unspecified   . Gout   . Back pain, chronic   . History of tonsillectomy   . History of cholecystectomy   . Depression   . IDDM (insulin dependent diabetes mellitus) ([place])   . Diabetes mellitus   . CHF (congestive heart failure) [facility])    Past Surgical History  Procedure Laterality Date  . Tonsillectomy    . Cholecystectomy  40 years ago    reports that she has never smoked. She has never  used smokeless tobacco. She reports that she does not drink alcohol or use illicit drugs. family history includes Coronary artery disease in her father; Diabetes in her brother and sister; Heart attack in her father; Hyperlipidemia in her brother, father, and sister; Hypertension in her brother, father, and sister. There is no history of Cancer. No Known Allergies Current Outpatient Prescriptions on File Prior to Visit  Medication Sig Dispense Refill  . allopurinol (ZYLOPRIM) 100 MG tablet Take 1 tablet (100 mg total) by mouth daily. 180 tablet 3  . amLODipine (NORVASC) 5 MG tablet Take 1 tablet (5 mg total) by mouth daily. 14 tablet 0  . anastrozole (ARIMIDEX) 1 MG tablet Take 1 tablet (1 mg total) by mouth daily. 90 tablet 3  . aspirin EC 81 MG tablet Take 81 mg by mouth daily.    Marland Kitchen atropine 1 % ophthalmic solution Place 1 drop into the left eye 2 (two) times daily.    . brimonidine (ALPHAGAN P) 0.1 % SOLN Apply 1 drop to eye 2 (two) times daily.    . citalopram (CELEXA) 20 MG tablet Take 1 tablet (20 mg total) by mouth daily. 90 tablet 3  . colchicine 0.6 MG tablet Take 1 tablet (0.6 mg total) by mouth 2 (two) times daily as needed (for gout flare up). 14 tablet 0  . dorzolamide-timolol (COSOPT) 22.3-6.8 MG/ML ophthalmic solution Place 1  drop into both eyes 2 (two) times daily.    Marland Kitchen gabapentin (NEURONTIN) 600 MG tablet Take 1 tablet (600 mg total) by mouth 2 (two) times daily. 180 tablet 3  . hydrALAZINE (APRESOLINE) 100 MG tablet TAKE 1 TABLET BY MOUTH 3 TIMES A DAY 90 tablet 0  . insulin glargine (LANTUS) 100 UNIT/ML injection Inject 0.06 mLs (6 Units total) into the skin at bedtime. 30 mL 3  . insulin lispro (HUMALOG) 100 UNIT/ML injection Inject 0.1 mLs (10 Units total) into the skin 2 (two) times daily. 30 mL 3  . metoprolol tartrate (LOPRESSOR) 25 MG tablet TAKE 2 TABLETS (50 MG TOTAL) BY MOUTH 2 (TWO) TIMES DAILY. 360 tablet 3  . nitroGLYCERIN (NITROSTAT) 0.4 MG SL tablet Place 1 tablet  (0.4 mg total) under the tongue as needed. For chest pain. 8 tablet 0  . nystatin (MYCOSTATIN) powder Use as directed 30 g 1  . potassium chloride (KLOR-CON M10) 10 MEQ tablet Take 2 tablets (20 mEq total) by mouth daily. 180 tablet 1  . pravastatin (PRAVACHOL) 80 MG tablet Take 1 tablet (80 mg total) by mouth daily. 90 tablet 3  . QUEtiapine (SEROQUEL) 50 MG tablet Take 1 tablet (50 mg total) by mouth 2 (two) times daily. 60 tablet 11  . VITAMIN D HIGH POTENCY 1000 UNITS capsule TAKE ONE CAPSULE BY MOUTH DAILY 100 capsule 2  . [DISCONTINUED] potassium chloride (K-DUR) 10 MEQ tablet Take 2 tablets (20 mEq total) by mouth daily. 30 tablet 5   No current facility-administered medications on file prior to visit.    Review of Systems Constitutional: Negative for increased diaphoresis, other activity, appetite or siginficant weight change other than noted HENT: Negative for worsening hearing loss, ear pain, facial swelling, mouth sores and neck stiffness.   Eyes: Negative for other worsening pain, redness or visual disturbance.  Respiratory: Negative for shortness of breath and wheezing  Cardiovascular: Negative for chest pain and palpitations.  Gastrointestinal: Negative for diarrhea, blood in stool, abdominal distention or other pain Genitourinary: Negative for hematuria, flank pain or change in urine volume.  Musculoskeletal: Negative for myalgias or other joint complaints.  Skin: Negative for color change and wound or drainage.  Neurological: Negative for syncope and numbness. other than noted Hematological: Negative for adenopathy. or other swelling Psychiatric/Behavioral: Negative for hallucinations, SI, self-injury, decreased concentration or other worsening agitation.       Objective:   Physical Exam BP 118/66 mmHg  Pulse 54  Temp(Src) 98.4 F (36.9 C) (Oral)  Resp 20  Wt 187 lb (84.823 kg)  SpO2 96% VS noted,  Constitutional: Pt is oriented to person, place, and time. Appears  well-developed and well-nourished, in no significant distress Head: Normocephalic and atraumatic.  Right Ear: External ear normal.  Left Ear: External ear normal.  Nose: Nose normal.  Mouth/Throat: Oropharynx is clear and moist.  Eyes: Conjunctivae and EOM are normal. Pupils are equal, round, and reactive to light.  Neck: Normal range of motion. Neck supple. No JVD present. No tracheal deviation present or significant neck LA or mass Cardiovascular: Normal rate, regular rhythm, normal heart sounds and intact distal pulses.   Pulmonary/Chest: Effort normal and breath sounds without rales or wheezing  Abdominal: Soft. Bowel sounds are normal. NT. No HSM  Musculoskeletal: Normal range of motion. Exhibits no edema.  Lymphadenopathy:  Has no cervical adenopathy.  Neurological: Pt is alert and oriented to person, place, and time. Pt has normal reflexes. No cranial nerve deficit. Motor grossly intact Skin:  Skin is warm and dry. No rash noted.  Psychiatric:  Has somber and affect. Behavior is normal.     Assessment & Plan:

## 2015-11-09 NOTE — Patient Instructions (Addendum)
You had the flu shot today  OK to stay off the furosemide  Please continue all other medications as before, and refills have been done if requested.  Please have the pharmacy call with any other refills you may need.  Please continue your efforts at being more active, low cholesterol diet, and weight control.  You are otherwise up to date with prevention measures today.  Please keep your appointments with your specialists as you may have planned  Please go to the LAB in the Basement (turn left off the elevator) for the tests to be done today  You will be contacted by phone if any changes need to be made immediately.  Otherwise, you will receive a letter about your results with an explanation, but please check with MyChart first.  Please remember to sign up for MyChart if you have not done so, as this will be important to you in the future with finding out test results, communicating by private email, and scheduling acute appointments online when needed.  Please return in 6 months, or sooner if needed

## 2015-11-09 NOTE — Progress Notes (Signed)
Pre visit review using our clinic review tool, if applicable. No additional management support is needed unless otherwise documented below in the visit note. 

## 2015-11-10 ENCOUNTER — Other Ambulatory Visit: Payer: Self-pay | Admitting: Internal Medicine

## 2015-11-10 ENCOUNTER — Encounter: Payer: Self-pay | Admitting: Internal Medicine

## 2015-11-10 DIAGNOSIS — N183 Chronic kidney disease, stage 3 (moderate): Secondary | ICD-10-CM

## 2015-11-10 MED ORDER — CEPHALEXIN 500 MG PO CAPS: 500.0000 mg | ORAL_CAPSULE | Freq: Four times a day (QID) | ORAL | Status: DC

## 2015-11-11 NOTE — Assessment & Plan Note (Signed)
stable overall by history and exam, recent data reviewed with pt, and pt to continue medical treatment as before,  to f/u any worsening symptoms or concerns BP Readings from Last 3 Encounters:  11/09/15 118/66  10/13/15 190/78  04/10/15 134/52

## 2015-11-11 NOTE — Assessment & Plan Note (Signed)
stable overall by history and exam, recent data reviewed with pt, and pt to continue medical treatment as before,  to f/u any worsening symptoms or concerns Lab Results  Component Value Date   HGBA1C 5.8 11/09/2015

## 2015-11-11 NOTE — Assessment & Plan Note (Signed)

## 2015-11-12 ENCOUNTER — Other Ambulatory Visit: Payer: Self-pay | Admitting: Endocrinology

## 2015-12-02 ENCOUNTER — Other Ambulatory Visit: Payer: Self-pay | Admitting: Internal Medicine

## 2015-12-05 ENCOUNTER — Other Ambulatory Visit: Payer: Self-pay | Admitting: Cardiovascular Disease

## 2016-01-04 ENCOUNTER — Telehealth: Payer: Self-pay | Admitting: Internal Medicine

## 2016-01-04 NOTE — Telephone Encounter (Signed)
NP called and stated that patient has 3+ edema, her heart rate was 50-60 varying. Patients BP was 100/60 and NP also stated that the patient is taking her seroquel in the morning instead of at night time which is making her very lethargic. Heart murmur and right carotid bruit. NP states that she feels like patient should come in to see you. Patient states that there has been no history of these in the past and is very concerned.

## 2016-01-04 NOTE — Telephone Encounter (Signed)
Please to make ROV

## 2016-01-04 NOTE — Telephone Encounter (Signed)
Abigail Butts a NP would like you to call her at [phone]

## 2016-01-05 NOTE — Telephone Encounter (Signed)
Scheduled for Monday at 10:30.  Spoke with sister.  Told to take patient to ER if she got worse before then per Corrine.

## 2016-01-06 ENCOUNTER — Other Ambulatory Visit: Payer: Self-pay | Admitting: Hematology and Oncology

## 2016-01-06 ENCOUNTER — Other Ambulatory Visit: Payer: Self-pay | Admitting: Endocrinology

## 2016-01-08 ENCOUNTER — Ambulatory Visit: Payer: Medicare Other | Admitting: Internal Medicine

## 2016-01-08 NOTE — Telephone Encounter (Signed)
Chart reviewed.

## 2016-01-21 ENCOUNTER — Other Ambulatory Visit: Payer: Self-pay | Admitting: Cardiovascular Disease

## 2016-01-23 NOTE — Telephone Encounter (Signed)
Patient has not been seen since 10/26/13. She missed her annual appointment and has no future appointments scheduled but is asking for hydralazine. Please advise

## 2016-01-24 ENCOUNTER — Other Ambulatory Visit: Payer: Self-pay | Admitting: Endocrinology

## 2016-01-24 NOTE — Telephone Encounter (Signed)
Since the pt has not been seen by our office since 2015 this refill request should be sent to the PCP (Dr Cathlean Cower) to refill.  The pt was seen by Dr [name] 11/09/15 and her BP was discussed at this appointment.

## 2016-02-05 ENCOUNTER — Other Ambulatory Visit: Payer: Self-pay | Admitting: Cardiovascular Disease

## 2016-02-06 NOTE — Telephone Encounter (Signed)
Ok to refill until appointment? 

## 2016-02-06 NOTE — Telephone Encounter (Signed)
Pt has not been seen here since January 2015.  Appt has been made for pt to see Dr. Angelena Form on July 24,2017 but pt needs to be scheduled sooner than this for refills.  I placed call to pt's son ([phone]) and made appt for pt to see Ermalinda Barrios, PA on May 15,2017 at 10:15.  Son made aware I will send refill of hydralazine to pharmacy to last until this appt.  Pt will need to keep this appt for future refills.

## 2016-02-14 ENCOUNTER — Other Ambulatory Visit: Payer: Self-pay | Admitting: Endocrinology

## 2016-02-25 ENCOUNTER — Other Ambulatory Visit: Payer: Self-pay | Admitting: Internal Medicine

## 2016-02-26 ENCOUNTER — Ambulatory Visit (INDEPENDENT_AMBULATORY_CARE_PROVIDER_SITE_OTHER): Payer: Medicare Other | Admitting: Physician Assistant

## 2016-02-26 ENCOUNTER — Encounter: Payer: Self-pay | Admitting: Physician Assistant

## 2016-02-26 VITALS — BP 132/76 | HR 52 | Ht 63.0 in | Wt 179.1 lb

## 2016-02-26 DIAGNOSIS — N184 Chronic kidney disease, stage 4 (severe): Secondary | ICD-10-CM

## 2016-02-26 DIAGNOSIS — I2510 Atherosclerotic heart disease of native coronary artery without angina pectoris: Secondary | ICD-10-CM | POA: Diagnosis not present

## 2016-02-26 DIAGNOSIS — F0391 Unspecified dementia with behavioral disturbance: Secondary | ICD-10-CM

## 2016-02-26 DIAGNOSIS — I10 Essential (primary) hypertension: Secondary | ICD-10-CM | POA: Diagnosis not present

## 2016-02-26 DIAGNOSIS — I5033 Acute on chronic diastolic (congestive) heart failure: Secondary | ICD-10-CM

## 2016-02-26 DIAGNOSIS — F03918 Unspecified dementia, unspecified severity, with other behavioral disturbance: Secondary | ICD-10-CM

## 2016-02-26 MED ORDER — METOPROLOL TARTRATE 25 MG PO TABS: 25.0000 mg | ORAL_TABLET | Freq: Two times a day (BID) | ORAL | Status: AC

## 2016-02-26 MED ORDER — AMLODIPINE BESYLATE 5 MG PO TABS: 5.0000 mg | ORAL_TABLET | Freq: Every day | ORAL | Status: AC

## 2016-02-26 MED ORDER — FUROSEMIDE 20 MG PO TABS: ORAL_TABLET | ORAL | Status: DC

## 2016-02-26 MED ORDER — HYDRALAZINE HCL 100 MG PO TABS: ORAL_TABLET | ORAL | Status: DC

## 2016-02-26 MED ORDER — POTASSIUM CHLORIDE CRYS ER 10 MEQ PO TBCR: EXTENDED_RELEASE_TABLET | ORAL | Status: DC

## 2016-02-26 NOTE — Patient Instructions (Addendum)
Medication Instructions:  1.  START the Lasix 20 mg taking 1 tablet as needed for edema / weight gain of more than 3 lbs in a day or 5 lbs in a week 2.  DECREASE the Metoprolol 25 mg taking 1 tablet twice a day  Labwork: None orderred  Testing/Procedures: None ordered  Follow-Up: Your physician recommends that you schedule a follow-up appointment in: [place]   Any Other Special Instructions Will Be Listed Below (If Applicable). Low-Sodium Eating Plan Sodium raises blood pressure and causes water to be held in the body. Getting less sodium from food will help lower your blood pressure, reduce any swelling, and protect your heart, liver, and kidneys. We get sodium by adding salt (sodium chloride) to food. Most of our sodium comes from canned, boxed, and frozen foods. Restaurant foods, fast foods, and pizza are also very high in sodium. Even if you take medicine to lower your blood pressure or to reduce fluid in your body, getting less sodium from your food is important. WHAT IS MY PLAN? Most people should limit their sodium intake to 2,300 mg a day. Your health care provider recommends that you limit your sodium intake to  2 GRAMS a day.  WHAT DO I NEED TO KNOW ABOUT THIS EATING PLA For the low-sodium eating plan, you will follow these general guidelines:  Choose foods with a % Daily Value for sodium of less than 5% (as listed on the food label).   Use salt-free seasonings or herbs instead of table salt or sea salt.   Check with your health care provider or pharmacist before using salt substitutes.   Eat fresh foods.  Eat more vegetables and fruits.  Limit canned vegetables. If you do use them, rinse them well to decrease the sodium.   Limit cheese to 1 oz (28 g) per day.   Eat lower-sodium products, often labeled as "lower sodium" or "no salt added."  Avoid foods that contain monosodium glutamate (MSG). MSG is sometimes added to  [place] food and some canned foods.  Check food labels (Nutrition Facts labels) on foods to learn how much sodium is in one serving.  Eat more home-cooked food and less restaurant, buffet, and fast food.  When eating at a restaurant, ask that your food be prepared with less salt, or no salt if possible.  HOW DO I READ FOOD LABELS FOR SODIUM INFORMATION? The Nutrition Facts label lists the amount of sodium in one serving of the food. If you eat more than one serving, you must multiply the listed amount of sodium by the number of servings. Food labels may also identify foods as:  Sodium free--Less than 5 mg in a serving.  Very low sodium--35 mg or less in a serving.  Low sodium--140 mg or less in a serving.  Light in sodium--50% less sodium in a serving. For example, if a food that usually has 300 mg of sodium is changed to become light in sodium, it will have 150 mg of sodium.  Reduced sodium--25% less sodium in a serving. For example, if a food that usually has 400 mg of sodium is changed to reduced sodium, it will have 300 mg of sodium. WHAT FOODS CAN I EAT? Grains Low-sodium cereals, including oats, puffed wheat and rice, and shredded wheat cereals. Low-sodium crackers. Unsalted rice and pasta. Lower-sodium bread.  Vegetables Frozen or fresh vegetables. Low-sodium or reduced-sodium canned vegetables. Low-sodium or reduced-sodium tomato sauce and paste. Low-sodium or reduced-sodium  tomato and vegetable juices.  Fruits Fresh, frozen, and canned fruit. Fruit juice.  Meat and Other Protein Products Low-sodium canned tuna and salmon. Fresh or frozen meat, poultry, seafood, and fish. Lamb. Unsalted nuts. Dried beans, peas, and lentils without added salt. Unsalted canned beans. Homemade soups without salt. Eggs.  Dairy Milk. Soy milk. Ricotta cheese. Low-sodium or reduced-sodium cheeses. Yogurt.  Condiments Fresh and dried herbs and spices. Salt-free seasonings. Onion and garlic  powders. Low-sodium varieties of mustard and ketchup. Fresh or refrigerated horseradish. Lemon juice.  Fats and Oils Reduced-sodium salad dressings. Unsalted butter.  Other Unsalted popcorn and pretzels.  The items listed above may not be a complete list of recommended foods or beverages. Contact your dietitian for more options. WHAT FOODS ARE NOT RECOMMENDED? Grains Instant hot cereals. Bread stuffing, pancake, and biscuit mixes. Croutons. Seasoned rice or pasta mixes. Noodle soup cups. Boxed or frozen macaroni and cheese. Self-rising flour. Regular salted crackers. Vegetables Regular canned vegetables. Regular canned tomato sauce and paste. Regular tomato and vegetable juices. Frozen vegetables in sauces. Salted [place] fries. Olives. Angie Fava. Relishes. Sauerkraut. Salsa. Meat and Other Protein Products Salted, canned, smoked, spiced, or pickled meats, seafood, or fish. Bacon, ham, sausage, hot dogs, corned beef, chipped beef, and packaged luncheon meats. Salt pork. Jerky. Pickled herring. Anchovies, regular canned tuna, and sardines. Salted nuts. Dairy Processed cheese and cheese spreads. Cheese curds. Blue cheese and cottage cheese. Buttermilk.  Condiments Onion and garlic salt, seasoned salt, table salt, and sea salt. Canned and packaged gravies. Worcestershire sauce. Tartar sauce. Barbecue sauce. Teriyaki sauce. Soy sauce, including reduced sodium. Steak sauce. Fish sauce. Oyster sauce. Cocktail sauce. Horseradish that you find on the shelf. Regular ketchup and mustard. Meat flavorings and tenderizers. Bouillon cubes. Hot sauce. Tabasco sauce. Marinades. Taco seasonings. Relishes. Fats and Oils Regular salad dressings. Salted butter. Margarine. Ghee. Bacon fat.  Other Potato and tortilla chips. Corn chips and puffs. Salted popcorn and pretzels. Canned or dried soups. Pizza. Frozen entrees and pot pies.  The items listed above may not be a complete list of foods and beverages to  avoid. Contact your dietitian for more information.   This information is not intended to replace advice given to you by your health care provider. Make sure you discuss any questions you have with your health care provider.   Document Released: 03/22/2002 Document Revised: 10/21/2014 Document Reviewed: 08/04/2013 Elsevier Interactive Patient Education [employer].    If you need a refill on your cardiac medications before your next appointment, please call your pharmacy.

## 2016-02-26 NOTE — Progress Notes (Signed)
Cardiology Office Note    Date:  02/26/2016   ID:  [name], DOB April 12, 1928, MRN [id number]  PCP:  Cathlean Cower, MD  Cardiologist:  Dr. Angelena Form  CC follow up.  History of Present Illness:  [name] is a 82 y.o. female with PMH of CAD with prior DES to the LAD in 2006, hypertension, dyslipidemia, stage III/IV CKD, T2DM with nephropathy, chronic anemia, GERD,Dementia. Patient was last seen by [place] in the hospital in 04/2014 when she was admitted with diastolic heart failure. He had an episode of unresponsiveness related to respiratory failure with hypercarbia and bradycardia.  Patient is brought in today by her son but she lives with a different son. They are running out of her medications that need to be filled. She has significant dementia. She denies any chest pain, palpitations, dyspnea, or cardiac complaints. The son that she lives with stopped her Lasix for unknown reasons. She does have chronic renal insufficiency. She has significant lower extremity edema today. Her son says it comes and goes. She does get a lot of salt in her diet. Her weight is actually down 8 pounds since primary care visit in January but edema is significant.    Past Medical History  Diagnosis Date  . Coronary artery disease   . Renal artery stenosis ([place])   . Hypertension   . Dyslipidemia   . GERD (gastroesophageal reflux disease)   . Tension headache   . Abdominal pain   . Anemia, unspecified   . Gout   . Back pain, chronic   . History of tonsillectomy   . History of cholecystectomy   . Depression   . IDDM (insulin dependent diabetes mellitus) ([place])   . Diabetes mellitus   . CHF (congestive heart failure) [facility])     Past Surgical History  Procedure Laterality Date  . Tonsillectomy    . Cholecystectomy  40 years ago    Current Medications: Outpatient Prescriptions Prior to Visit  Medication Sig Dispense Refill  . allopurinol (ZYLOPRIM) 100 MG tablet Take 1 tablet (100 mg total)  by mouth daily. 180 tablet 3  . anastrozole (ARIMIDEX) 1 MG tablet TAKE 1 TABLET (1 MG TOTAL) BY MOUTH DAILY. 90 tablet 1  . aspirin EC 81 MG tablet Take 81 mg by mouth daily.    Marland Kitchen atropine 1 % ophthalmic solution Place 1 drop into the left eye 2 (two) times daily.    . brimonidine (ALPHAGAN P) 0.1 % SOLN Apply 1 drop to eye 2 (two) times daily.    . citalopram (CELEXA) 20 MG tablet Take 1 tablet (20 mg total) by mouth daily. 90 tablet 3  . colchicine 0.6 MG tablet Take 1 tablet (0.6 mg total) by mouth 2 (two) times daily as needed (for gout flare up). 14 tablet 0  . diazepam (VALIUM) 5 MG tablet Take 1 tablet (5 mg total) by mouth every 6 (six) hours as needed for anxiety. 30 tablet 1  . dorzolamide-timolol (COSOPT) 22.3-6.8 MG/ML ophthalmic solution Place 1 drop into both eyes 2 (two) times daily.    Marland Kitchen gabapentin (NEURONTIN) 600 MG tablet Take 1 tablet (600 mg total) by mouth 2 (two) times daily. 180 tablet 3  . insulin glargine (LANTUS) 100 UNIT/ML injection Inject 0.06 mLs (6 Units total) into the skin at bedtime. 30 mL 3  . insulin lispro (HUMALOG) 100 UNIT/ML injection Inject 0.1 mLs (10 Units total) into the skin 2 (two) times daily. 30 mL 3  . nitroGLYCERIN (  NITROSTAT) 0.4 MG SL tablet Place 1 tablet (0.4 mg total) under the tongue as needed. For chest pain. 8 tablet 0  . nystatin (MYCOSTATIN) powder Use as directed 30 g 1  . pantoprazole (PROTONIX) 40 MG tablet Take 1 tablet (40 mg total) by mouth daily. 90 tablet 3  . pravastatin (PRAVACHOL) 80 MG tablet Take 1 tablet (80 mg total) by mouth daily. 90 tablet 3  . QUEtiapine (SEROQUEL) 50 MG tablet Take 1 tablet (50 mg total) by mouth 2 (two) times daily. 60 tablet 11  . VITAMIN D HIGH POTENCY 1000 UNITS capsule TAKE ONE CAPSULE BY MOUTH DAILY 100 capsule 2  . amLODipine (NORVASC) 5 MG tablet Take 1 tablet (5 mg total) by mouth daily. 14 tablet 0  . cephALEXin (KEFLEX) 500 MG capsule Take 1 capsule (500 mg total) by mouth 4 (four) times  daily. 40 capsule 0  . hydrALAZINE (APRESOLINE) 100 MG tablet TAKE 1 TABLET (100 MG TOTAL) BY MOUTH 3 (THREE) TIMES DAILY. 90 tablet 0  . KLOR-CON M10 10 MEQ tablet TAKE 2 TABLETS (20 MEQ TOTAL) BY MOUTH DAILY. 180 tablet 2  . metoprolol tartrate (LOPRESSOR) 25 MG tablet TAKE 2 TABLETS (50 MG TOTAL) BY MOUTH 2 (TWO) TIMES DAILY. 360 tablet 3   No facility-administered medications prior to visit.     Allergies:   Review of patient's allergies indicates no known allergies.   Social History   Social History  . Marital Status: Widowed    Spouse Name: N/A  . Number of Children: 6  . Years of Education: N/A   Occupational History  . Retired    Social History Main Topics  . Smoking status: Never Smoker   . Smokeless tobacco: Never Used  . Alcohol Use: No  . Drug Use: No  . Sexual Activity: Not Currently   Other Topics Concern  . None   Social History Narrative   Married over 50 years.   8th grade-went to work.      Physician Roster:    Jaclyn PrimeDwyane Dee   Cardo- McAlhaney   Opthal- Groat     Family History:  The patient's    family history includes Coronary artery disease in her father; Diabetes in her brother and sister; Heart attack in her father; Hyperlipidemia in her brother, father, and sister; Hypertension in her brother, father, and sister. There is no history of Cancer.   ROS:   Please see the history of present illness.   Review of systems is done by her son as she has dementia Review of Systems  Constitution: Positive for malaise/fatigue.  HENT: Positive for headaches.   Eyes: Positive for visual disturbance.  Cardiovascular: Positive for dyspnea on exertion and leg swelling.  Respiratory: Negative.   Hematologic/Lymphatic: Negative.   Musculoskeletal: Positive for back pain and joint pain.  Gastrointestinal: Negative.   Genitourinary: Negative.   Neurological: Positive for loss of balance.   All other systems reviewed and are negative.   PHYSICAL EXAM:   VS:   BP 132/76 mmHg  Pulse 52  Ht 5\' 3"  (1.6 m)  Wt 179 lb 1.9 oz (81.248 kg)  BMI 31.74 kg/m2   GEN: Well nourished, well developed, in no acute distress HEENT: normal Neck: Right carotid bruit no JVD,  or masses Cardiac:  RRR; 2/6 systolic murmur at the left sternal border, no rubs, or gallops, +2-3 brawny edema bilateral lower extremities  Respiratory:  clear to auscultation bilaterally, normal work of breathing GI: soft, nontender, nondistended, +  BS MS: no deformity or atrophy Skin: warm and dry, no rash Neuro:  Alert and Oriented x 3, Strength and sensation are intact Psych: euthymic mood, full affect  Wt Readings from Last 3 Encounters:  02/26/16 179 lb 1.9 oz (81.248 kg)  11/09/15 187 lb (84.823 kg)  10/13/15 186 lb 8 oz (84.596 kg)      Studies/Labs Reviewed:   EKG:  EKG is  ordered today.  The ekg ordered today demonstrates Sinus bradycardia 52 bpm  Recent Labs: 11/09/2015: ALT 6; BUN 36*; Creatinine, Ser 2.01*; Hemoglobin 9.8*; Platelets 152.0; Potassium 4.4; Sodium 141; TSH 1.17   Lipid Panel    Component Value Date/Time   CHOL 125 11/09/2015 1645   TRIG 110.0 11/09/2015 1645   HDL 35.90* 11/09/2015 1645   CHOLHDL 3 11/09/2015 1645   VLDL 22.0 11/09/2015 1645   LDLCALC 67 11/09/2015 1645   LDLDIRECT 148.0 02/23/2015 1244    Additional studies/ records that were reviewed today include:  2-D echo 2015 Study Conclusions  - Left ventricle: The cavity size was normal. Wall thickness   was increased increased in a pattern of mild to moderate   LVH. Systolic function was normal. The estimated ejection   fraction was in the range of 55% to 60%. Wall motion was   normal; there were no regional wall motion abnormalities.   Features are consistent with a pseudonormal left   ventricular filling pattern, with concomitant abnormal   relaxation and increased filling pressure (grade 2   diastolic dysfunction). - Aortic valve: There is calcification above the valve and    what may be either dense calcium or a possible   supravalvular membrane. Trileaflet; moderately thickened,   moderately calcified leaflets. Cusp separation was mildly   reduced. Valve mobility was mildly restricted.   Transvalvular velocity was increased. There was mild to   moderate stenosis. Valve area: 1.24cm^2(VTI). Valve area:   1.33cm^2 (Vmax). - Mitral valve: Mildly calcified annulus. Mild   regurgitation. - Left atrium: The atrium was moderately to severely   dilated. - Pulmonary arteries: Systolic pressure was moderately   increased. PA peak pressure: 37mm Hg (S). Transthoracic echocardiography.  M-mode, complete 2D, spectral Doppler, and color Doppler.  Height:  Height: 157.5cm. Height: 62in.  Weight:  Weight: 89.4kg. Weight: 196.6lb.  Body mass index:  BMI: 36kg/m^2.  Body surface area:    BSA: 1.16m^2.  Blood pressure:     160/74.  Patient status:  Inpatient.  Location:  Bedside.     ASSESSMENT:    1. Essential hypertension   2. Acute on chronic diastolic HF (heart failure) (HCC)   3. Atherosclerosis of native coronary artery of native heart without angina pectoris   4. Dementia, in, senility, with behavioral disturbance   5. Chronic kidney disease, stage IV (severe) (HCC)      PLAN:  In order of problems listed above:  Hypertension blood pressure stable  Acute on chronic CHF patient has significant lower extremity edema. Her son stopped her Lasix a while ago. She does have chronic renal insufficiency. She is also getting a lot of salt in her diet. We'll resume Lasix 20 mg when necessary for leg edema 2 g sodium diet she has follow-up RA scheduled with Dr. Cathlean Cower who can follow-up or edema. She has follow-up with Dr.McAlhany in July  CAD is stable without chest pain  Dementia limits her treatment  CK D creatinine was 2.01 in January.   Medication Adjustments/Labs and Tests Ordered: Current medicines are  reviewed at length with the patient today.   Concerns regarding medicines are outlined above.  Medication changes, Labs and Tests ordered today are listed in the Patient Instructions below. Patient Instructions  Medication Instructions:  1.  START the Lasix 20 mg taking 1 tablet as needed for edema / weight gain of more than 3 lbs in a day or 5 lbs in a week 2.  DECREASE the Metoprolol 25 mg taking 1 tablet twice a day  Labwork: None orderred  Testing/Procedures: None ordered  Follow-Up: Your physician recommends that you schedule a follow-up appointment in: [place]   Any Other Special Instructions Will Be Listed Below (If Applicable). Low-Sodium Eating Plan Sodium raises blood pressure and causes water to be held in the body. Getting less sodium from food will help lower your blood pressure, reduce any swelling, and protect your heart, liver, and kidneys. We get sodium by adding salt (sodium chloride) to food. Most of our sodium comes from canned, boxed, and frozen foods. Restaurant foods, fast foods, and pizza are also very high in sodium. Even if you take medicine to lower your blood pressure or to reduce fluid in your body, getting less sodium from your food is important. WHAT IS MY PLAN? Most people should limit their sodium intake to 2,300 mg a day. Your health care provider recommends that you limit your sodium intake to  2 GRAMS a day.  WHAT DO I NEED TO KNOW ABOUT THIS EATING PLA For the low-sodium eating plan, you will follow these general guidelines:  Choose foods with a % Daily Value for sodium of less than 5% (as listed on the food label).   Use salt-free seasonings or herbs instead of table salt or sea salt.   Check with your health care provider or pharmacist before using salt substitutes.   Eat fresh foods.  Eat more vegetables and fruits.  Limit canned vegetables. If you do use them, rinse them well to decrease the sodium.   Limit cheese to 1 oz (28 g) per day.    Eat lower-sodium products, often labeled as "lower sodium" or "no salt added."  Avoid foods that contain monosodium glutamate (MSG). MSG is sometimes added to [place] food and some canned foods.  Check food labels (Nutrition Facts labels) on foods to learn how much sodium is in one serving.  Eat more home-cooked food and less restaurant, buffet, and fast food.  When eating at a restaurant, ask that your food be prepared with less salt, or no salt if possible.  HOW DO I READ FOOD LABELS FOR SODIUM INFORMATION? The Nutrition Facts label lists the amount of sodium in one serving of the food. If you eat more than one serving, you must multiply the listed amount of sodium by the number of servings. Food labels may also identify foods as:  Sodium free--Less than 5 mg in a serving.  Very low sodium--35 mg or less in a serving.  Low sodium--140 mg or less in a serving.  Light in sodium--50% less sodium in a serving. For example, if a food that usually has 300 mg of sodium is changed to become light in sodium, it will have 150 mg of sodium.  Reduced sodium--25% less sodium in a serving. For example, if a food that usually has 400 mg of sodium is changed to reduced sodium, it will have 300 mg of sodium. WHAT FOODS CAN I EAT? Grains Low-sodium cereals, including oats, puffed wheat and rice,  and shredded wheat cereals. Low-sodium crackers. Unsalted rice and pasta. Lower-sodium bread.  Vegetables Frozen or fresh vegetables. Low-sodium or reduced-sodium canned vegetables. Low-sodium or reduced-sodium tomato sauce and paste. Low-sodium or reduced-sodium tomato and vegetable juices.  Fruits Fresh, frozen, and canned fruit. Fruit juice.  Meat and Other Protein Products Low-sodium canned tuna and salmon. Fresh or frozen meat, poultry, seafood, and fish. Lamb. Unsalted nuts. Dried beans, peas, and lentils without added salt. Unsalted canned beans. Homemade soups without salt. Eggs.   Dairy Milk. Soy milk. Ricotta cheese. Low-sodium or reduced-sodium cheeses. Yogurt.  Condiments Fresh and dried herbs and spices. Salt-free seasonings. Onion and garlic powders. Low-sodium varieties of mustard and ketchup. Fresh or refrigerated horseradish. Lemon juice.  Fats and Oils Reduced-sodium salad dressings. Unsalted butter.  Other Unsalted popcorn and pretzels.  The items listed above may not be a complete list of recommended foods or beverages. Contact your dietitian for more options. WHAT FOODS ARE NOT RECOMMENDED? Grains Instant hot cereals. Bread stuffing, pancake, and biscuit mixes. Croutons. Seasoned rice or pasta mixes. Noodle soup cups. Boxed or frozen macaroni and cheese. Self-rising flour. Regular salted crackers. Vegetables Regular canned vegetables. Regular canned tomato sauce and paste. Regular tomato and vegetable juices. Frozen vegetables in sauces. Salted [place] fries. Olives. Angie Fava. Relishes. Sauerkraut. Salsa. Meat and Other Protein Products Salted, canned, smoked, spiced, or pickled meats, seafood, or fish. Bacon, ham, sausage, hot dogs, corned beef, chipped beef, and packaged luncheon meats. Salt pork. Jerky. Pickled herring. Anchovies, regular canned tuna, and sardines. Salted nuts. Dairy Processed cheese and cheese spreads. Cheese curds. Blue cheese and cottage cheese. Buttermilk.  Condiments Onion and garlic salt, seasoned salt, table salt, and sea salt. Canned and packaged gravies. Worcestershire sauce. Tartar sauce. Barbecue sauce. Teriyaki sauce. Soy sauce, including reduced sodium. Steak sauce. Fish sauce. Oyster sauce. Cocktail sauce. Horseradish that you find on the shelf. Regular ketchup and mustard. Meat flavorings and tenderizers. Bouillon cubes. Hot sauce. Tabasco sauce. Marinades. Taco seasonings. Relishes. Fats and Oils Regular salad dressings. Salted butter. Margarine. Ghee. Bacon fat.  Other Potato and tortilla chips. Corn chips  and puffs. Salted popcorn and pretzels. Canned or dried soups. Pizza. Frozen entrees and pot pies.  The items listed above may not be a complete list of foods and beverages to avoid. Contact your dietitian for more information.   This information is not intended to replace advice given to you by your health care provider. Make sure you discuss any questions you have with your health care provider.   Document Released: 03/22/2002 Document Revised: 10/21/2014 Document Reviewed: 08/04/2013 Elsevier Interactive Patient Education [employer].    If you need a refill on your cardiac medications before your next appointment, please call your pharmacy.       Sumner Boast, PA-C  02/26/2016 11:02 AM    [facility] [address], [place], [place]  [address] Phone: [phone]; Fax: [phone]

## 2016-02-29 ENCOUNTER — Other Ambulatory Visit: Payer: Self-pay | Admitting: Internal Medicine

## 2016-02-29 NOTE — Telephone Encounter (Signed)
Done erx 

## 2016-02-29 NOTE — Telephone Encounter (Signed)
Please advise 

## 2016-03-20 ENCOUNTER — Other Ambulatory Visit: Payer: Self-pay | Admitting: Endocrinology

## 2016-04-05 ENCOUNTER — Encounter: Payer: Self-pay | Admitting: Cardiovascular Disease

## 2016-04-12 ENCOUNTER — Ambulatory Visit: Payer: Medicare Other | Admitting: Hematology and Oncology

## 2016-04-12 NOTE — Assessment & Plan Note (Signed)
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 She denies any hot flashes or myalgias. Denies any bleeding symptoms. Response to treatment: Clinically the mass appears to be 6-7 cm. Overall not a significant change from before. Patient does feel that it smaller than before.  Surveillance: Mammogram and ultrasound December 2016 revealed stable findings. The left breast mass measures 6.7 cm and left axillary lymph node measures 3.6 cm. Both of these are unchanged  Hypertension: Blood pressure was systolic of 99991111. Patient for better take a blood pressure medication and she will go home and immediately take it.  RTC 6 months for follow-up with another mammogram and ultrasound.

## 2016-05-04 ENCOUNTER — Other Ambulatory Visit: Payer: Self-pay | Admitting: Internal Medicine

## 2016-05-06 ENCOUNTER — Ambulatory Visit: Payer: Medicare Other | Admitting: Cardiovascular Disease

## 2016-05-30 ENCOUNTER — Emergency Department (HOSPITAL_COMMUNITY): Payer: Medicare Other

## 2016-05-30 ENCOUNTER — Encounter (HOSPITAL_COMMUNITY): Payer: Self-pay | Admitting: Emergency Medicine

## 2016-05-30 DIAGNOSIS — Y999 Unspecified external cause status: Secondary | ICD-10-CM | POA: Diagnosis not present

## 2016-05-30 DIAGNOSIS — E1122 Type 2 diabetes mellitus with diabetic chronic kidney disease: Secondary | ICD-10-CM | POA: Insufficient documentation

## 2016-05-30 DIAGNOSIS — Z7982 Long term (current) use of aspirin: Secondary | ICD-10-CM | POA: Diagnosis not present

## 2016-05-30 DIAGNOSIS — I2510 Atherosclerotic heart disease of native coronary artery without angina pectoris: Secondary | ICD-10-CM | POA: Insufficient documentation

## 2016-05-30 DIAGNOSIS — Z79899 Other long term (current) drug therapy: Secondary | ICD-10-CM | POA: Diagnosis not present

## 2016-05-30 DIAGNOSIS — Z794 Long term (current) use of insulin: Secondary | ICD-10-CM | POA: Insufficient documentation

## 2016-05-30 DIAGNOSIS — Z853 Personal history of malignant neoplasm of breast: Secondary | ICD-10-CM | POA: Diagnosis not present

## 2016-05-30 DIAGNOSIS — W268XXA Contact with other sharp object(s), not elsewhere classified, initial encounter: Secondary | ICD-10-CM | POA: Insufficient documentation

## 2016-05-30 DIAGNOSIS — I130 Hypertensive heart and chronic kidney disease with heart failure and stage 1 through stage 4 chronic kidney disease, or unspecified chronic kidney disease: Secondary | ICD-10-CM | POA: Insufficient documentation

## 2016-05-30 DIAGNOSIS — N184 Chronic kidney disease, stage 4 (severe): Secondary | ICD-10-CM | POA: Diagnosis not present

## 2016-05-30 DIAGNOSIS — S41111A Laceration without foreign body of right upper arm, initial encounter: Secondary | ICD-10-CM | POA: Diagnosis not present

## 2016-05-30 DIAGNOSIS — I5033 Acute on chronic diastolic (congestive) heart failure: Secondary | ICD-10-CM | POA: Diagnosis not present

## 2016-05-30 DIAGNOSIS — Y9289 Other specified places as the place of occurrence of the external cause: Secondary | ICD-10-CM | POA: Insufficient documentation

## 2016-05-30 DIAGNOSIS — Y939 Activity, unspecified: Secondary | ICD-10-CM | POA: Diagnosis not present

## 2016-05-30 NOTE — ED Triage Notes (Signed)
Patient fell earlier today, landing on a corner of a desk with her right upper arm.  No deformity, but patient does have a skin tear near right arm axilla.  Bleeding controlled.  Patient denies hitting head.  No blood thinner.  No LOC.

## 2016-05-31 ENCOUNTER — Emergency Department (HOSPITAL_COMMUNITY)
Admission: EM | Admit: 2016-05-31 | Discharge: 2016-05-31 | Disposition: A | Discharge status: Home / Self Care | Payer: Medicare Other | Attending: Emergency Medicine | Admitting: Emergency Medicine

## 2016-05-31 DIAGNOSIS — W19XXXA Unspecified fall, initial encounter: Secondary | ICD-10-CM

## 2016-05-31 LAB — CBG MONITORING, ED: GLUCOSE-CAPILLARY: 142 mg/dL — AB (ref 65–99)

## 2016-05-31 MED ORDER — ACETAMINOPHEN 325 MG PO TABS
650.0000 mg | ORAL_TABLET | Freq: Once | ORAL | Status: AC
  Administered 2016-05-31: 650 mg via ORAL
  Filled 2016-05-31: qty 2

## 2016-05-31 MED ORDER — LIDOCAINE HCL (PF) 1 % IJ SOLN: 30.0000 mL | Freq: Once | INTRAMUSCULAR | Status: DC

## 2016-05-31 MED ORDER — LIDOCAINE HCL (PF) 1 % IJ SOLN
2.0000 mL | Freq: Once | INTRAMUSCULAR | Status: AC
  Administered 2016-05-31: 2 mL
  Filled 2016-05-31: qty 5

## 2016-05-31 NOTE — Discharge Instructions (Signed)
Please follow up with your primary doctor in about 10 days for suture removal. The incision site may be sore for a couple of days. Please take over the counter tylenol or advil as needed for pain. If the site becomes very red, swollen, painful, or develops drainage, please return for evaluation. Thank you.

## 2016-05-31 NOTE — ED Provider Notes (Signed)
[facility] DEPT Provider Note   CSN: [id number] Arrival date & time: 05/30/16  1901   History   Chief Complaint Chief Complaint  Patient presents with  . Fall  . Arm Pain    HPI [name] is a 82 y.o. female.  Patient presents today after a fall. Patient says she was in bed when she got up to use the bathroom. She felt a little lightheaded and reached for the bedside table when she fell. She hit her upper right arm on the table resulting in a laceration. No LOC, no head trauma. Denies chest pain/SOB. No fevers/recent illnesses. All other ROS negative. Patient has a history of diabetes and is on insulin at home. Patient said she has not checked her blood sugar in a couple of days. She endorses pain from her laceration but otherwise is feeling well with no complaints.       Past Medical History:  Diagnosis Date  . Abdominal pain   . Anemia, unspecified   . Back pain, chronic   . CHF (congestive heart failure) ([place])   . Coronary artery disease   . Depression   . Diabetes mellitus   . Dyslipidemia   . GERD (gastroesophageal reflux disease)   . Gout   . History of cholecystectomy   . History of tonsillectomy   . Hypertension   . IDDM (insulin dependent diabetes mellitus) ([place])   . Renal artery stenosis ([place])   . Tension headache     Patient Active Problem List   Diagnosis Date Noted  . Preventative health care 02/23/2015  . Hematoma 12/17/2014  . Rash and nonspecific skin eruption 12/17/2014  . Breast cancer of upper-outer quadrant of left female breast ([place]) 11/02/2014  . Desmoid tumor 10/21/2014  . Chronic kidney disease, stage III (moderate) 10/20/2014  . Dementia, in, senility 10/20/2014  . Mass of leg 08/25/2014  . CAP (community acquired pneumonia) 05/06/2014  . Symptomatic bradycardia 05/06/2014  . Hyperkalemia 05/06/2014  . Acute on chronic diastolic HF (heart failure) ([place]) 05/05/2014  . Acute respiratory failure with hypoxia ([place]) 05/05/2014    . Community acquired pneumonia 11/11/2013  . Arteriosclerosis of coronary artery 09/28/2013  . Diabetes mellitus type 1.5 ([place]) 09/28/2013  . Acid reflux 09/28/2013  . BP (high blood pressure) 09/28/2013  . Atrophy, Fuchs' 09/06/2013  . Chronic kidney disease, stage IV (severe) ([place]) 02/25/2013  . Debility 01/26/2013  . GLAUCOMA 07/19/2009  . CAD, NATIVE VESSEL 02/16/2009  . RENAL ARTERY STENOSIS 02/10/2009  . HEADACHE, TENSION 07/22/2008  . GOUT 09/17/2007  . UNSPECIFIED ANEMIA 09/17/2007  . BACK PAIN, CHRONIC 09/17/2007  . Type 1 diabetes mellitus ([place]) 07/24/2007  . Hyperlipidemia 07/24/2007  . Depression 07/24/2007  . Essential hypertension 07/24/2007  . GERD 07/24/2007  . Other acquired absence of organ 07/24/2007    Past Surgical History:  Procedure Laterality Date  . CHOLECYSTECTOMY  40 years ago  . TONSILLECTOMY      OB History    No data available       Home Medications    Prior to Admission medications   Medication Sig Start Date End Date Taking? Authorizing Provider  allopurinol (ZYLOPRIM) 100 MG tablet Take 1 tablet (100 mg total) by mouth daily. 02/27/15   Biagio Borg, MD  amLODipine (NORVASC) 5 MG tablet Take 1 tablet (5 mg total) by mouth daily. 02/26/16   Imogene Burn, PA-C  anastrozole (ARIMIDEX) 1 MG tablet TAKE 1 TABLET (1 MG TOTAL) BY MOUTH DAILY. 01/08/16  Nicholas Lose, MD  aspirin EC 81 MG tablet Take 81 mg by mouth daily.    Historical Provider, MD  atropine 1 % ophthalmic solution Place 1 drop into the left eye 2 (two) times daily.    Historical Provider, MD  brimonidine (ALPHAGAN P) 0.1 % SOLN Apply 1 drop to eye 2 (two) times daily.    Historical Provider, MD  citalopram (CELEXA) 20 MG tablet Take 1 tablet (20 mg total) by mouth daily. 02/23/15   Biagio Borg, MD  colchicine 0.6 MG tablet Take 1 tablet (0.6 mg total) by mouth 2 (two) times daily as needed (for gout flare up). 01/09/15   Elayne Snare, MD  diazepam (VALIUM) 5 MG tablet Take 1  tablet (5 mg total) by mouth every 6 (six) hours as needed for anxiety. 11/09/15   Biagio Borg, MD  dorzolamide-timolol (COSOPT) 22.3-6.8 MG/ML ophthalmic solution Place 1 drop into both eyes 2 (two) times daily.    Historical Provider, MD  furosemide (LASIX) 20 MG tablet TAKE 1 TABLET BY MOUTH DAILY AS NEEDED FOR EDEMA OR WEIGHT GAIN OF 3 LBS OR MORE IN 1 DAY OR 5LBS IN A WEEK 02/26/16   Imogene Burn, PA-C  gabapentin (NEURONTIN) 600 MG tablet TAKE 1 TABLET BY MOUTH TWICE A DAY 02/29/16   Biagio Borg, MD  hydrALAZINE (APRESOLINE) 100 MG tablet TAKE 1 TABLET (100 MG TOTAL) BY MOUTH 3 (THREE) TIMES DAILY. 02/26/16   Imogene Burn, PA-C  insulin glargine (LANTUS) 100 UNIT/ML injection Inject 0.06 mLs (6 Units total) into the skin at bedtime. 01/23/15   Elayne Snare, MD  insulin lispro (HUMALOG) 100 UNIT/ML injection Inject 0.1 mLs (10 Units total) into the skin 2 (two) times daily. 01/23/15   Elayne Snare, MD  metoprolol tartrate (LOPRESSOR) 25 MG tablet Take 1 tablet (25 mg total) by mouth 2 (two) times daily. 02/26/16   Imogene Burn, PA-C  nitroGLYCERIN (NITROSTAT) 0.4 MG SL tablet Place 1 tablet (0.4 mg total) under the tongue as needed. For chest pain. 02/23/15   Biagio Borg, MD  nystatin (MYCOSTATIN) powder Use as directed 12/13/14   Biagio Borg, MD  pantoprazole (PROTONIX) 40 MG tablet Take 1 tablet (40 mg total) by mouth daily. 11/09/15   Biagio Borg, MD  potassium chloride (KLOR-CON M10) 10 MEQ tablet TAKE 2 TABLETS (20 MEQ TOTAL) BY MOUTH DAILY. 02/26/16   Imogene Burn, PA-C  pravastatin (PRAVACHOL) 80 MG tablet TAKE 1 TABLET BY MOUTH DAILY 05/04/16   Biagio Borg, MD  QUEtiapine (SEROQUEL) 50 MG tablet TAKE 1 TABLET (50 MG TOTAL) BY MOUTH 2 (TWO) TIMES DAILY. 02/26/16   Biagio Borg, MD  VITAMIN D HIGH POTENCY 1000 UNITS capsule TAKE ONE CAPSULE BY MOUTH DAILY 06/22/15   Biagio Borg, MD    Family History Family History  Problem Relation Age of Onset  . Coronary artery disease Father   .  Hypertension Father   . Heart attack Father   . Hyperlipidemia Father   . Diabetes Sister   . Hypertension Sister   . Hyperlipidemia Sister   . Hypertension Brother   . Diabetes Brother   . Hyperlipidemia Brother   . Cancer Neg Hx     Breast and colon    Social History Social History  Substance Use Topics  . Smoking status: Never Smoker  . Smokeless tobacco: Never Used  . Alcohol use No     Allergies   Review of  patient's allergies indicates no known allergies.   Review of Systems Review of Systems  All other systems reviewed and are negative.    Physical Exam Updated Vital Signs BP 189/77   Pulse (!) 51   Temp 97.9 F (36.6 C) (Oral)   Resp 14   SpO2 100%   Physical Exam  Constitutional: She appears well-developed and well-nourished. No distress.  HENT:  Head: Normocephalic and atraumatic.  Eyes: Conjunctivae are normal.  Neck: Neck supple.  Cardiovascular: Normal rate and regular rhythm.   No murmur heard. Pulmonary/Chest: Effort normal and breath sounds normal. No respiratory distress.  Abdominal: Soft. There is no tenderness.  Musculoskeletal: She exhibits no edema.       Arms: Neurological: She is alert.  Skin: Skin is warm and dry.  Psychiatric: She has a normal mood and affect.  Nursing note and vitals reviewed.    ED Treatments / Results  Labs (all labs ordered are listed, but only abnormal results are displayed) Labs Reviewed  CBG MONITORING, ED - Abnormal; Notable for the following:       Result Value   Glucose-Capillary 142 (*)    All other components within normal limits    EKG  EKG Interpretation None       Radiology Dg Humerus Right  Result Date: 05/30/2016 CLINICAL DATA:  [place] earlier today landing on corner of desk with RIGHT upper arm, small skin tear EXAM: RIGHT HUMERUS - 2+ VIEW COMPARISON:  None FINDINGS: Osseous demineralization. AC joint alignment normal. No acute fracture, dislocation or bone destruction. Scattered  atherosclerotic calcifications. IMPRESSION: Osseous demineralization without definite acute bony abnormalities. Electronically Signed   By: Lavonia Dana M.D.   On: 05/30/2016 20:31    Procedures Procedures (including critical care time)  Medications Ordered in ED Medications  lidocaine (PF) (XYLOCAINE) 1 % injection 30 mL ( Intradermal Canceled Entry 05/31/16 0250)  acetaminophen (TYLENOL) tablet 650 mg (650 mg Oral Given 05/31/16 0250)  lidocaine (PF) (XYLOCAINE) 1 % injection 2 mL (2 mLs Other Given 05/31/16 0250)     Initial Impression / Assessment and Plan / ED Course  I have reviewed the triage vital signs and the nursing notes.  Pertinent labs & imaging results that were available during my care of the patient were reviewed by me and considered in my medical decision making (see chart for details).  Clinical Course    Fall: Mechanical, no syncope. CBG 142.  R arm laceration repaired. F/U with PCP.   Final Clinical Impressions(s) / ED Diagnoses   Final diagnoses:  None    New Prescriptions New Prescriptions   No medications on file     Velna Ochs, MD 05/31/16 [id number]    Ripley Fraise, MD 05/31/16 [phone]

## 2016-05-31 NOTE — ED Provider Notes (Signed)
LACERATION REPAIR Performed by: Charlann Lange A Authorized by: Charlann Lange A Consent: Verbal consent obtained. Risks and benefits: risks, benefits and alternatives were discussed Consent given by: patient Patient identity confirmed: provided demographic data Prepped and Draped in normal sterile fashion Wound explored  Laceration Location: right upper, medial arm  Laceration Length: 5 cm  No Foreign Bodies seen or palpated  Anesthesia: local infiltration  Local anesthetic: lidocaine 1% w/o epinephrine  Anesthetic total: 3 ml  Irrigation method: syringe Amount of cleaning: standard  Skin closure: 4-0 prolene  Number of sutures: 10  Technique: simple interrupted  Patient tolerance: Patient tolerated the procedure well with no immediate complications.    Charlann Lange, PA-C 05/31/16 [id number]    Ripley Fraise, MD 05/31/16 2312

## 2016-06-02 ENCOUNTER — Other Ambulatory Visit: Payer: Self-pay | Admitting: Internal Medicine

## 2016-06-04 ENCOUNTER — Other Ambulatory Visit: Payer: Self-pay | Admitting: Endocrinology

## 2016-06-21 ENCOUNTER — Encounter: Payer: Self-pay | Admitting: Internal Medicine

## 2016-06-21 ENCOUNTER — Other Ambulatory Visit (INDEPENDENT_AMBULATORY_CARE_PROVIDER_SITE_OTHER): Payer: Medicare Other

## 2016-06-21 ENCOUNTER — Ambulatory Visit (INDEPENDENT_AMBULATORY_CARE_PROVIDER_SITE_OTHER): Payer: Medicare Other | Admitting: Internal Medicine

## 2016-06-21 VITALS — BP 140/82 | HR 49 | Temp 98.5°F | Resp 20 | Wt 178.0 lb

## 2016-06-21 DIAGNOSIS — E139 Other specified diabetes mellitus without complications: Secondary | ICD-10-CM

## 2016-06-21 DIAGNOSIS — Z23 Encounter for immunization: Secondary | ICD-10-CM | POA: Diagnosis not present

## 2016-06-21 DIAGNOSIS — D649 Anemia, unspecified: Secondary | ICD-10-CM | POA: Diagnosis not present

## 2016-06-21 DIAGNOSIS — E785 Hyperlipidemia, unspecified: Secondary | ICD-10-CM

## 2016-06-21 DIAGNOSIS — I10 Essential (primary) hypertension: Secondary | ICD-10-CM

## 2016-06-21 DIAGNOSIS — N183 Chronic kidney disease, stage 3 unspecified: Secondary | ICD-10-CM

## 2016-06-21 LAB — HEPATIC FUNCTION PANEL
ALT: 7 U/L (ref 0–35)
AST: 13 U/L (ref 0–37)
Albumin: 3.4 g/dL — ABNORMAL LOW (ref 3.5–5.2)
Alkaline Phosphatase: 105 U/L (ref 39–117)
BILIRUBIN TOTAL: 0.3 mg/dL (ref 0.2–1.2)
Bilirubin, Direct: 0.1 mg/dL (ref 0.0–0.3)
Total Protein: 6.6 g/dL (ref 6.0–8.3)

## 2016-06-21 LAB — CBC WITH DIFFERENTIAL/PLATELET
BASOS ABS: 0 10*3/uL (ref 0.0–0.1)
BASOS PCT: 0.3 % (ref 0.0–3.0)
EOS ABS: 0.1 10*3/uL (ref 0.0–0.7)
Eosinophils Relative: 1.7 % (ref 0.0–5.0)
HEMATOCRIT: 31.3 % — AB (ref 36.0–46.0)
HEMOGLOBIN: 10.3 g/dL — AB (ref 12.0–15.0)
LYMPHS PCT: 17.4 % (ref 12.0–46.0)
Lymphs Abs: 1.1 10*3/uL (ref 0.7–4.0)
MCHC: 32.8 g/dL (ref 30.0–36.0)
MCV: 91.9 fl (ref 78.0–100.0)
MONOS PCT: 8.1 % (ref 3.0–12.0)
Monocytes Absolute: 0.5 10*3/uL (ref 0.1–1.0)
Neutro Abs: 4.7 10*3/uL (ref 1.4–7.7)
Neutrophils Relative %: 72.5 % (ref 43.0–77.0)
Platelets: 172 10*3/uL (ref 150.0–400.0)
RBC: 3.41 Mil/uL — ABNORMAL LOW (ref 3.87–5.11)
RDW: 14.9 % (ref 11.5–15.5)
WBC: 6.5 10*3/uL (ref 4.0–10.5)

## 2016-06-21 LAB — LIPID PANEL
Cholesterol: 132 mg/dL (ref 0–200)
HDL: 37.9 mg/dL — AB (ref >39.00)
LDL Cholesterol: 71 mg/dL (ref 0–99)
NONHDL: 94.16
Total CHOL/HDL Ratio: 3
Triglycerides: 117 mg/dL (ref 0.0–149.0)
VLDL: 23.4 mg/dL (ref 0.0–40.0)

## 2016-06-21 LAB — BASIC METABOLIC PANEL
BUN: 41 mg/dL — AB (ref 6–23)
CALCIUM: 9.3 mg/dL (ref 8.4–10.5)
CHLORIDE: 109 meq/L (ref 96–112)
CO2: 27 mEq/L (ref 19–32)
CREATININE: 1.78 mg/dL — AB (ref 0.40–1.20)
GFR: 34.72 mL/min — ABNORMAL LOW (ref >60.00)
Glucose, Bld: 116 mg/dL — ABNORMAL HIGH (ref 70–99)
Potassium: 4.6 mEq/L (ref 3.5–5.1)
Sodium: 140 mEq/L (ref 135–145)

## 2016-06-21 LAB — IBC PANEL
IRON: 35 ug/dL — AB (ref 42–145)
Saturation Ratios: 12.3 % — ABNORMAL LOW (ref 20.0–50.0)
TRANSFERRIN: 203 mg/dL — AB (ref 212.0–360.0)

## 2016-06-21 LAB — VITAMIN B12: Vitamin B-12: 976 pg/mL — ABNORMAL HIGH (ref 211–911)

## 2016-06-21 LAB — HEMOGLOBIN A1C: HEMOGLOBIN A1C: 5.6 % (ref 4.6–6.5)

## 2016-06-21 NOTE — Progress Notes (Signed)
Subjective:    Patient ID: [name], female    DOB: [birth year], 82 y.o.   MRN: [id number]  HPI    Here with husband, Here to f/u; overall doing ok,  Pt denies chest pain, increasing sob or doe, wheezing, orthopnea, PND, increased LE swelling, palpitations, dizziness or syncope.  Pt denies new neurological symptoms such as new headache, or facial or extremity weakness or numbness.  Pt denies polydipsia, polyuria, or low sugar episode.   Pt denies new neurological symptoms such as new headache, or facial or extremity weakness or numbness.   Pt states overall good compliance with meds, mostly trying to follow appropriate diet, with wt overall stable recently. Wt Readings from Last 3 Encounters:  06/21/16 178 lb (80.7 kg)  02/26/16 179 lb 1.9 oz (81.2 kg)  11/09/15 187 lb (84.8 kg)  Never took the iron supplement. Due for flu shot.  Needs stitches out right medial upper arm after fall and laceration 2 wks ago after struck arm on nightstand Past Medical History:  Diagnosis Date  . Abdominal pain   . Anemia, unspecified   . Back pain, chronic   . CHF (congestive heart failure) ([place])   . Coronary artery disease   . Depression   . Diabetes mellitus   . Dyslipidemia   . GERD (gastroesophageal reflux disease)   . Gout   . History of cholecystectomy   . History of tonsillectomy   . Hypertension   . IDDM (insulin dependent diabetes mellitus) ([place])   . Renal artery stenosis ([place])   . Tension headache    Past Surgical History:  Procedure Laterality Date  . CHOLECYSTECTOMY  40 years ago  . TONSILLECTOMY      reports that she has never smoked. She has never used smokeless tobacco. She reports that she does not drink alcohol or use drugs. family history includes Coronary artery disease in her father; Diabetes in her brother and sister; Heart attack in her father; Hyperlipidemia in her brother, father, and sister; Hypertension in her brother, father, and sister. No Known  Allergies Current Outpatient Prescriptions on File Prior to Visit  Medication Sig Dispense Refill  . ACCU-CHEK SMARTVIEW test strip USE AS INSTRUCTED TO CHECK BLOOD SUGARS 2 TIMES A DAY 200 each 2  . allopurinol (ZYLOPRIM) 100 MG tablet Take 1 tablet (100 mg total) by mouth daily. 180 tablet 3  . amLODipine (NORVASC) 5 MG tablet Take 1 tablet (5 mg total) by mouth daily. 90 tablet 3  . anastrozole (ARIMIDEX) 1 MG tablet TAKE 1 TABLET (1 MG TOTAL) BY MOUTH DAILY. 90 tablet 1  . aspirin EC 81 MG tablet Take 81 mg by mouth daily.    Marland Kitchen atropine 1 % ophthalmic solution Place 1 drop into the left eye 2 (two) times daily.    . brimonidine (ALPHAGAN P) 0.1 % SOLN Apply 1 drop to eye 2 (two) times daily.    . citalopram (CELEXA) 20 MG tablet Take 1 tablet (20 mg total) by mouth daily. 90 tablet 3  . colchicine 0.6 MG tablet Take 1 tablet (0.6 mg total) by mouth 2 (two) times daily as needed (for gout flare up). 14 tablet 0  . diazepam (VALIUM) 5 MG tablet Take 1 tablet (5 mg total) by mouth every 6 (six) hours as needed for anxiety. 30 tablet 1  . dorzolamide-timolol (COSOPT) 22.3-6.8 MG/ML ophthalmic solution Place 1 drop into both eyes 2 (two) times daily.    . furosemide (LASIX) 20 MG tablet  TAKE 1 TABLET BY MOUTH DAILY AS NEEDED FOR EDEMA OR WEIGHT GAIN OF 3 LBS OR MORE IN 1 DAY OR 5LBS IN A WEEK 90 tablet 3  . gabapentin (NEURONTIN) 600 MG tablet TAKE 1 TABLET BY MOUTH TWICE A DAY 180 tablet 2  . hydrALAZINE (APRESOLINE) 100 MG tablet TAKE 1 TABLET (100 MG TOTAL) BY MOUTH 3 (THREE) TIMES DAILY. 270 tablet 1  . insulin glargine (LANTUS) 100 UNIT/ML injection Inject 0.06 mLs (6 Units total) into the skin at bedtime. 30 mL 3  . insulin lispro (HUMALOG) 100 UNIT/ML injection Inject 0.1 mLs (10 Units total) into the skin 2 (two) times daily. 30 mL 3  . metoprolol tartrate (LOPRESSOR) 25 MG tablet Take 1 tablet (25 mg total) by mouth 2 (two) times daily. 180 tablet 3  . nitroGLYCERIN (NITROSTAT) 0.4 MG SL  tablet Place 1 tablet (0.4 mg total) under the tongue as needed. For chest pain. 8 tablet 0  . nystatin (MYCOSTATIN) powder Use as directed 30 g 1  . pantoprazole (PROTONIX) 40 MG tablet Take 1 tablet (40 mg total) by mouth daily. 90 tablet 3  . potassium chloride (KLOR-CON M10) 10 MEQ tablet TAKE 2 TABLETS (20 MEQ TOTAL) BY MOUTH DAILY. 180 tablet 3  . pravastatin (PRAVACHOL) 80 MG tablet TAKE 1 TABLET BY MOUTH DAILY 90 tablet 1  . QUEtiapine (SEROQUEL) 50 MG tablet TAKE 1 TABLET (50 MG TOTAL) BY MOUTH 2 (TWO) TIMES DAILY. 60 tablet 5  . VITAMIN D HIGH POTENCY 1000 UNITS capsule TAKE ONE CAPSULE BY MOUTH DAILY 100 capsule 2  . [DISCONTINUED] potassium chloride (K-DUR) 10 MEQ tablet Take 2 tablets (20 mEq total) by mouth daily. 30 tablet 5   No current facility-administered medications on file prior to visit.    Review of Systems  Constitutional: Negative for unusual diaphoresis or night sweats HENT: Negative for ear swelling or discharge Eyes: Negative for worsening visual haziness  Respiratory: Negative for choking and stridor.   Gastrointestinal: Negative for distension or worsening eructation Genitourinary: Negative for retention or change in urine volume.  Musculoskeletal: Negative for other MSK pain or swelling Skin: Negative for color change and worsening wound Neurological: Negative for tremors and numbness other than noted  Psychiatric/Behavioral: Negative for decreased concentration or agitation other than above       Objective:   Physical Exam BP 140/82   Pulse (!) 49   Temp 98.5 F (36.9 C) (Oral)   Resp 20   Wt 178 lb (80.7 kg)   SpO2 96%   BMI 31.53 kg/m  VS noted, not ill appearing, seated in wheelchair Constitutional: Pt appears in no apparent distress HENT: Head: NCAT.  Right Ear: External ear normal.  Left Ear: External ear normal.  Eyes: . Pupils are equal, round, and reactive to light. Conjunctivae and EOM are normal Neck: Normal range of motion. Neck  supple.  Cardiovascular: Normal rate and regular rhythm.   Pulmonary/Chest: Effort normal and breath sounds without rales or wheezing.  Abd:  Soft, NT, ND, + BS Neurological: Pt is alert. Not confused , motor grossly intact Skin: Skin is warm. No rash, no LE edema Psychiatric: Pt behavior is normal. No agitation.     Assessment & Plan:

## 2016-06-21 NOTE — Progress Notes (Signed)
Pre visit review using our clinic review tool, if applicable. No additional management support is needed unless otherwise documented below in the visit note. 

## 2016-06-21 NOTE — Patient Instructions (Addendum)
You had the flu shot today  Your stitches were removed today  Please continue all other medications as before, and refills have been done if requested.  Please have the pharmacy call with any other refills you may need.  Please continue your efforts at being more active, low cholesterol diet, and weight control.  You are otherwise up to date with prevention measures today.  You will be contacted regarding the referral for: kidney doctors  Please keep your appointments with your specialists as you may have planned  Please go to the LAB in the Basement (turn left off the elevator) for the tests to be done today  You will be contacted by phone if any changes need to be made immediately.  Otherwise, you will receive a letter about your results with an explanation, but please check with MyChart first.  Please return in 6 months, or sooner if needed

## 2016-06-22 NOTE — Assessment & Plan Note (Signed)
Mild, likely anemia chronic dz, for cbc, iron, b12 level,  to f/u any worsening symptoms or concerns

## 2016-06-22 NOTE — Assessment & Plan Note (Signed)
stable overall by history and exam, recent data reviewed with pt, and pt to continue medical treatment as before,  to f/u any worsening symptoms or concerns Lab Results  Component Value Date   LDLCALC 71 06/21/2016

## 2016-06-22 NOTE — Assessment & Plan Note (Signed)
stable overall by history and exam, recent data reviewed with pt, and pt to continue medical treatment as before,  to f/u any worsening symptoms or concerns Lab Results  Component Value Date   CREATININE 1.78 (H) 06/21/2016

## 2016-06-22 NOTE — Assessment & Plan Note (Signed)
stable overall by history and exam, recent data reviewed with pt, and pt to continue medical treatment as before,  to f/u any worsening symptoms or concerns Lab Results  Component Value Date   HGBA1C 5.6 06/21/2016   Pt to cont f/u with endo per husband preference

## 2016-06-22 NOTE — Assessment & Plan Note (Signed)
stable overall by history and exam, recent data reviewed with pt, and pt to continue medical treatment as before,  to f/u any worsening symptoms or concerns BP Readings from Last 3 Encounters:  06/21/16 140/82  05/31/16 164/84  02/26/16 132/76

## 2016-06-25 ENCOUNTER — Encounter: Payer: Self-pay | Admitting: Internal Medicine

## 2016-07-12 ENCOUNTER — Other Ambulatory Visit: Payer: Self-pay | Admitting: Hematology and Oncology

## 2016-07-15 NOTE — Telephone Encounter (Signed)
Chart reviewed.

## 2016-07-22 ENCOUNTER — Other Ambulatory Visit: Payer: Self-pay | Admitting: Physician Assistant

## 2016-08-12 ENCOUNTER — Other Ambulatory Visit: Payer: Self-pay | Admitting: Hematology and Oncology

## 2016-08-14 ENCOUNTER — Other Ambulatory Visit: Payer: Self-pay | Admitting: *Deleted

## 2016-08-14 MED ORDER — QUETIAPINE FUMARATE 50 MG PO TABS: ORAL_TABLET | ORAL | 0 refills | Status: DC

## 2016-08-27 ENCOUNTER — Other Ambulatory Visit: Payer: Self-pay | Admitting: Internal Medicine

## 2016-09-03 ENCOUNTER — Other Ambulatory Visit: Payer: Self-pay

## 2016-09-03 MED ORDER — ANASTROZOLE 1 MG PO TABS: 1.0000 mg | ORAL_TABLET | Freq: Every day | ORAL | 0 refills | Status: DC

## 2016-09-04 ENCOUNTER — Other Ambulatory Visit: Payer: Self-pay

## 2016-09-04 MED ORDER — ANASTROZOLE 1 MG PO TABS: 1.0000 mg | ORAL_TABLET | Freq: Every day | ORAL | 0 refills | Status: DC

## 2016-09-11 ENCOUNTER — Other Ambulatory Visit: Payer: Self-pay | Admitting: Internal Medicine

## 2016-09-12 ENCOUNTER — Other Ambulatory Visit: Payer: Self-pay | Admitting: Hematology and Oncology

## 2016-09-17 NOTE — Assessment & Plan Note (Deleted)
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 She denies any hot flashes or myalgias. Denies any bleeding symptoms. Response to treatment: Clinically the mass appears to be 6-7 cm. Overall not a significant change from before. Patient does feel that it smaller than before.  Surveillance: Mammogram and ultrasound December 2016 revealed stable findings. The left breast mass measures 6.7 cm and left axillary lymph node measures 3.6 cm. Both of these are unchanged  Hypertension: Blood pressure was systolic of 99991111. RTC 6 months for follow-up

## 2016-09-18 ENCOUNTER — Ambulatory Visit: Payer: Medicare Other | Admitting: Hematology and Oncology

## 2016-09-18 NOTE — Progress Notes (Deleted)
Patient Care Team: Biagio Borg, MD as PCP - General (Internal Medicine) Elayne Snare, MD (Endocrinology) Burnell Blanks, MD (Cardiology) Clent Jacks, MD (Ophthalmology)  DIAGNOSIS:  Encounter Diagnosis  Name Primary?  . 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 Goal is to control her disease as long as possible.      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 She has been on oral antiestrogen therapy for the past 2 years. This has kept the tumor relatively stable. She is not a surgical candidate and hence she is currently being treated with palliative intent to keep the DCIS from progressing into malignancy. She is tolerating anastrozole reasonably well. She does not have any hot flashes. She does have arthritis.  REVIEW OF SYSTEMS:   Constitutional: Denies fevers, chills or abnormal weight loss Eyes: Denies blurriness of vision Ears, nose, mouth, throat, and face: Denies mucositis or sore throat Respiratory: Denies cough, dyspnea or wheezes Cardiovascular: Denies palpitation, chest discomfort Gastrointestinal:  Denies nausea, heartburn or change in bowel habits Skin:  Denies abnormal skin rashes Lymphatics: Denies new lymphadenopathy or easy bruising Neurological:Denies numbness, tingling or new weaknesses Behavioral/Psych: Mood is stable, no new changes  Extremities: No lower extremity edema Breast: Palpable mass in the left breast All other systems were reviewed with the patient and are negative.  I have reviewed the past medical history, past surgical history, social history and family history with the patient and they are unchanged from previous note.  ALLERGIES:  has No Known Allergies.  MEDICATIONS:  Current Outpatient Prescriptions  Medication Sig Dispense Refill  . ACCU-CHEK SMARTVIEW test strip USE AS INSTRUCTED TO CHECK BLOOD SUGARS 2 TIMES A DAY 200 each 2  . allopurinol (ZYLOPRIM) 100 MG tablet Take 1 tablet (100 mg total) by mouth daily. 180 tablet 3  . amLODipine (NORVASC) 5 MG tablet Take 1 tablet (5 mg total) by mouth daily. 90 tablet 3  . anastrozole (ARIMIDEX) 1 MG tablet Take 1 tablet (1 mg total) by mouth daily. 30 tablet 0  . anastrozole (ARIMIDEX) 1 MG tablet TAKE 1 TABLET EVERY DAY 30 tablet 0  . aspirin EC 81 MG tablet Take 81 mg by mouth daily.    Marland Kitchen atropine 1 % ophthalmic solution Place 1 drop into the left eye 2 (two) times daily.    . brimonidine (ALPHAGAN P) 0.1 % SOLN Apply 1 drop to eye 2 (two) times daily.    . citalopram (CELEXA) 20 MG tablet Take 1 tablet (20 mg total) by mouth daily. 90 tablet 3  . colchicine 0.6 MG tablet Take 1 tablet (0.6 mg  total) by mouth 2 (two) times daily as needed (for gout flare up). 14 tablet 0  . [employer] D3 1000 units capsule TAKE ONE CAPSULE BY MOUTH DAILY 120 capsule 2  . diazepam (VALIUM) 5 MG tablet Take 1 tablet (5 mg total) by mouth every 6 (six) hours as needed for anxiety. 30 tablet 1  . dorzolamide-timolol (COSOPT) 22.3-6.8 MG/ML ophthalmic solution Place 1 drop into both eyes 2 (two) times daily.    . furosemide (LASIX) 20 MG tablet TAKE 1 TABLET BY MOUTH DAILY AS NEEDED FOR EDEMA OR  WEIGHT GAIN OF 3 LBS OR MORE IN 1 DAY OR 5LBS IN A WEEK 90 tablet 3  . gabapentin (NEURONTIN) 600 MG tablet TAKE 1 TABLET BY MOUTH TWICE A DAY 180 tablet 2  . hydrALAZINE (APRESOLINE) 100 MG tablet TAKE 1 TABLET (100 MG TOTAL) BY MOUTH 3 (THREE) TIMES DAILY. 270 tablet 1  . insulin glargine (LANTUS) 100 UNIT/ML injection Inject 0.06 mLs (6 Units total) into the skin at bedtime. 30 mL 3  . insulin lispro (HUMALOG) 100 UNIT/ML injection Inject 0.1 mLs (10 Units total) into the skin 2 (two) times daily. 30 mL 3  . metoprolol tartrate (LOPRESSOR) 25 MG tablet Take 1 tablet (25 mg total) by mouth 2 (two) times daily. 180 tablet 3  . NITROSTAT 0.4 MG SL tablet PLACE 1 TABLET (0.4 MG TOTAL) UNDER THE TONGUE AS NEEDED. FOR CHEST PAIN. 25 tablet 0  . nystatin (MYCOSTATIN) powder Use as directed 30 g 1  . pantoprazole (PROTONIX) 40 MG tablet Take 1 tablet (40 mg total) by mouth daily. 90 tablet 3  . potassium chloride (KLOR-CON M10) 10 MEQ tablet TAKE 2 TABLETS (20 MEQ TOTAL) BY MOUTH DAILY. 180 tablet 3  . pravastatin (PRAVACHOL) 80 MG tablet TAKE 1 TABLET BY MOUTH DAILY 90 tablet 1  . QUEtiapine (SEROQUEL) 50 MG tablet TAKE 1 TABLET (50 MG TOTAL) BY MOUTH 2 (TWO) TIMES DAILY. 180 tablet 0   No current facility-administered medications for this visit.     PHYSICAL EXAMINATION: ECOG PERFORMANCE STATUS: 1 - Symptomatic but completely ambulatory  There were no vitals filed for this visit. There were no vitals filed for this visit.  GENERAL:alert, no distress and comfortable SKIN: skin color, texture, turgor are normal, no rashes or significant lesions EYES: normal, Conjunctiva are pink and non-injected, sclera clear OROPHARYNX:no exudate, no erythema and lips, buccal mucosa, and tongue normal  NECK: supple, thyroid normal size, non-tender, without nodularity LYMPH:  no palpable lymphadenopathy in the cervical, axillary or inguinal LUNGS: clear to auscultation and percussion with normal breathing  effort HEART: regular rate & rhythm and no murmurs and no lower extremity edema ABDOMEN:abdomen soft, non-tender and normal bowel sounds MUSCULOSKELETAL:no cyanosis of digits and no clubbing  NEURO: alert & oriented x 3 with fluent speech, no focal motor/sensory deficits EXTREMITIES: No lower extremity edema BREAST: Large palpable mass in the left breast. No palpable axillary supraclavicular or infraclavicular adenopathy no breast tenderness or nipple discharge. (exam performed in the presence of a chaperone)  LABORATORY DATA:  I have reviewed the data as listed   Chemistry      Component Value Date/Time   NA 140 06/21/2016 1521   K 4.6 06/21/2016 1521   CL 109 06/21/2016 1521   CO2 27 06/21/2016 1521   BUN 41 (H) 06/21/2016 1521   CREATININE 1.78 (H) 06/21/2016 1521      Component Value Date/Time   CALCIUM 9.3 06/21/2016 1521   ALKPHOS 105  06/21/2016 1521   AST 13 06/21/2016 1521   ALT 7 06/21/2016 1521   BILITOT 0.3 06/21/2016 1521       Lab Results  Component Value Date   WBC 6.5 06/21/2016   HGB 10.3 (L) 06/21/2016   HCT 31.3 (L) 06/21/2016   MCV 91.9 06/21/2016   PLT 172.0 06/21/2016   NEUTROABS 4.7 06/21/2016    ASSESSMENT & PLAN:  Breast cancer of upper-outer quadrant of left female breast Left breast DCIS involving the papillary lesion ER 100% B 29% , grade 1 diagnosed 09/23/2014 started anastrozole 1 mg daily 11/02/14 for palliation (not a surgical candidate)  Anastrozole Toxicities: Patient does not have any side effects anastrozole therapy. She denies any hot flashes or myalgias. Denies any bleeding symptoms. Response to treatment: Clinically the mass appears to be 6-7 cm. Overall not a significant change from before. Patient does feel that it smaller than before.  Surveillance: Mammogram and ultrasound December 2016 revealed stable findings. The left breast mass measures 6.7 cm and left axillary lymph node measures 3.6 cm. Both of these are  unchanged  Hypertension: Blood pressure was systolic of 99991111. RTC 6 months for follow-up   No orders of the defined types were placed in this encounter.  The patient has a good understanding of the overall plan. she agrees with it. she will call with any problems that may develop before the next visit here.   Rulon Eisenmenger, MD 09/18/16

## 2016-09-25 ENCOUNTER — Other Ambulatory Visit: Payer: Self-pay | Admitting: Endocrinology

## 2016-10-13 ENCOUNTER — Other Ambulatory Visit: Payer: Self-pay | Admitting: Internal Medicine

## 2016-10-24 ENCOUNTER — Other Ambulatory Visit: Payer: Self-pay | Admitting: Internal Medicine

## 2016-10-29 ENCOUNTER — Other Ambulatory Visit: Payer: Self-pay

## 2016-10-29 MED ORDER — ANASTROZOLE 1 MG PO TABS: 1.0000 mg | ORAL_TABLET | Freq: Every day | ORAL | 0 refills | Status: DC

## 2016-11-01 ENCOUNTER — Other Ambulatory Visit: Payer: Self-pay

## 2016-11-01 ENCOUNTER — Telehealth: Payer: Self-pay

## 2016-11-01 MED ORDER — ANASTROZOLE 1 MG PO TABS: 1.0000 mg | ORAL_TABLET | Freq: Every day | ORAL | 0 refills | Status: AC

## 2016-11-01 NOTE — Telephone Encounter (Signed)
Called pt to remind her that her 1 year follow up is due and also refilled anastrozole for this month. Unable to get a hold of pt but left msg with daughter to call back with scheduling appt.

## 2016-11-19 ENCOUNTER — Other Ambulatory Visit: Payer: Self-pay | Admitting: Internal Medicine

## 2016-11-23 ENCOUNTER — Other Ambulatory Visit: Payer: Self-pay | Admitting: Internal Medicine

## 2016-11-26 ENCOUNTER — Encounter (HOSPITAL_COMMUNITY): Payer: Self-pay | Admitting: Emergency Medicine

## 2016-11-26 ENCOUNTER — Emergency Department (HOSPITAL_COMMUNITY): Payer: Medicare Other

## 2016-11-26 ENCOUNTER — Inpatient Hospital Stay (HOSPITAL_COMMUNITY)
Admission: EM | Admit: 2016-11-26 | Discharge: 2016-12-02 | DRG: 193 | Disposition: A | Discharge status: Skilled Nursing Facility | Payer: Medicare Other | Attending: Internal Medicine | Admitting: Internal Medicine

## 2016-11-26 DIAGNOSIS — C50919 Malignant neoplasm of unspecified site of unspecified female breast: Secondary | ICD-10-CM | POA: Diagnosis not present

## 2016-11-26 DIAGNOSIS — Z9049 Acquired absence of other specified parts of digestive tract: Secondary | ICD-10-CM | POA: Diagnosis not present

## 2016-11-26 DIAGNOSIS — I5032 Chronic diastolic (congestive) heart failure: Secondary | ICD-10-CM | POA: Diagnosis not present

## 2016-11-26 DIAGNOSIS — J189 Pneumonia, unspecified organism: Secondary | ICD-10-CM | POA: Diagnosis not present

## 2016-11-26 DIAGNOSIS — Z79899 Other long term (current) drug therapy: Secondary | ICD-10-CM

## 2016-11-26 DIAGNOSIS — Z515 Encounter for palliative care: Secondary | ICD-10-CM | POA: Diagnosis present

## 2016-11-26 DIAGNOSIS — Z79811 Long term (current) use of aromatase inhibitors: Secondary | ICD-10-CM

## 2016-11-26 DIAGNOSIS — J9811 Atelectasis: Secondary | ICD-10-CM | POA: Diagnosis not present

## 2016-11-26 DIAGNOSIS — E1122 Type 2 diabetes mellitus with diabetic chronic kidney disease: Secondary | ICD-10-CM | POA: Diagnosis present

## 2016-11-26 DIAGNOSIS — N184 Chronic kidney disease, stage 4 (severe): Secondary | ICD-10-CM | POA: Diagnosis present

## 2016-11-26 DIAGNOSIS — C787 Secondary malignant neoplasm of liver and intrahepatic bile duct: Secondary | ICD-10-CM | POA: Diagnosis not present

## 2016-11-26 DIAGNOSIS — R5383 Other fatigue: Secondary | ICD-10-CM | POA: Diagnosis not present

## 2016-11-26 DIAGNOSIS — M109 Gout, unspecified: Secondary | ICD-10-CM | POA: Diagnosis not present

## 2016-11-26 DIAGNOSIS — K219 Gastro-esophageal reflux disease without esophagitis: Secondary | ICD-10-CM | POA: Diagnosis present

## 2016-11-26 DIAGNOSIS — L8990 Pressure ulcer of unspecified site, unspecified stage: Secondary | ICD-10-CM | POA: Insufficient documentation

## 2016-11-26 DIAGNOSIS — N179 Acute kidney failure, unspecified: Secondary | ICD-10-CM | POA: Diagnosis present

## 2016-11-26 DIAGNOSIS — I10 Essential (primary) hypertension: Secondary | ICD-10-CM | POA: Diagnosis present

## 2016-11-26 DIAGNOSIS — R531 Weakness: Secondary | ICD-10-CM | POA: Diagnosis not present

## 2016-11-26 DIAGNOSIS — D72829 Elevated white blood cell count, unspecified: Secondary | ICD-10-CM | POA: Diagnosis not present

## 2016-11-26 DIAGNOSIS — G8929 Other chronic pain: Secondary | ICD-10-CM | POA: Diagnosis present

## 2016-11-26 DIAGNOSIS — G9341 Metabolic encephalopathy: Secondary | ICD-10-CM | POA: Diagnosis present

## 2016-11-26 DIAGNOSIS — L89153 Pressure ulcer of sacral region, stage 3: Secondary | ICD-10-CM | POA: Diagnosis not present

## 2016-11-26 DIAGNOSIS — F32A Depression, unspecified: Secondary | ICD-10-CM | POA: Diagnosis present

## 2016-11-26 DIAGNOSIS — E861 Hypovolemia: Secondary | ICD-10-CM | POA: Diagnosis not present

## 2016-11-26 DIAGNOSIS — D649 Anemia, unspecified: Secondary | ICD-10-CM | POA: Diagnosis present

## 2016-11-26 DIAGNOSIS — R627 Adult failure to thrive: Secondary | ICD-10-CM | POA: Diagnosis present

## 2016-11-26 DIAGNOSIS — IMO0001 Reserved for inherently not codable concepts without codable children: Secondary | ICD-10-CM

## 2016-11-26 DIAGNOSIS — R918 Other nonspecific abnormal finding of lung field: Secondary | ICD-10-CM

## 2016-11-26 DIAGNOSIS — Z66 Do not resuscitate: Secondary | ICD-10-CM | POA: Diagnosis present

## 2016-11-26 DIAGNOSIS — Z7189 Other specified counseling: Secondary | ICD-10-CM | POA: Diagnosis not present

## 2016-11-26 DIAGNOSIS — C50412 Malignant neoplasm of upper-outer quadrant of left female breast: Secondary | ICD-10-CM | POA: Diagnosis not present

## 2016-11-26 DIAGNOSIS — C7951 Secondary malignant neoplasm of bone: Secondary | ICD-10-CM | POA: Diagnosis not present

## 2016-11-26 DIAGNOSIS — F0391 Unspecified dementia with behavioral disturbance: Secondary | ICD-10-CM | POA: Diagnosis present

## 2016-11-26 DIAGNOSIS — I130 Hypertensive heart and chronic kidney disease with heart failure and stage 1 through stage 4 chronic kidney disease, or unspecified chronic kidney disease: Secondary | ICD-10-CM | POA: Diagnosis present

## 2016-11-26 DIAGNOSIS — M898X9 Other specified disorders of bone, unspecified site: Secondary | ICD-10-CM | POA: Diagnosis present

## 2016-11-26 DIAGNOSIS — F03918 Unspecified dementia, unspecified severity, with other behavioral disturbance: Secondary | ICD-10-CM | POA: Diagnosis present

## 2016-11-26 DIAGNOSIS — R140 Abdominal distension (gaseous): Secondary | ICD-10-CM | POA: Diagnosis not present

## 2016-11-26 DIAGNOSIS — R404 Transient alteration of awareness: Secondary | ICD-10-CM | POA: Diagnosis not present

## 2016-11-26 DIAGNOSIS — N390 Urinary tract infection, site not specified: Secondary | ICD-10-CM | POA: Diagnosis not present

## 2016-11-26 DIAGNOSIS — B952 Enterococcus as the cause of diseases classified elsewhere: Secondary | ICD-10-CM | POA: Diagnosis not present

## 2016-11-26 DIAGNOSIS — E785 Hyperlipidemia, unspecified: Secondary | ICD-10-CM | POA: Diagnosis present

## 2016-11-26 DIAGNOSIS — Z8673 Personal history of transient ischemic attack (TIA), and cerebral infarction without residual deficits: Secondary | ICD-10-CM

## 2016-11-26 DIAGNOSIS — L89892 Pressure ulcer of other site, stage 2: Secondary | ICD-10-CM | POA: Diagnosis present

## 2016-11-26 DIAGNOSIS — I2510 Atherosclerotic heart disease of native coronary artery without angina pectoris: Secondary | ICD-10-CM | POA: Diagnosis present

## 2016-11-26 DIAGNOSIS — M899 Disorder of bone, unspecified: Secondary | ICD-10-CM | POA: Diagnosis present

## 2016-11-26 DIAGNOSIS — F329 Major depressive disorder, single episode, unspecified: Secondary | ICD-10-CM | POA: Diagnosis present

## 2016-11-26 DIAGNOSIS — C7989 Secondary malignant neoplasm of other specified sites: Secondary | ICD-10-CM | POA: Diagnosis not present

## 2016-11-26 DIAGNOSIS — D509 Iron deficiency anemia, unspecified: Secondary | ICD-10-CM | POA: Diagnosis present

## 2016-11-26 DIAGNOSIS — M549 Dorsalgia, unspecified: Secondary | ICD-10-CM | POA: Diagnosis present

## 2016-11-26 DIAGNOSIS — Z794 Long term (current) use of insulin: Secondary | ICD-10-CM

## 2016-11-26 DIAGNOSIS — E860 Dehydration: Secondary | ICD-10-CM | POA: Diagnosis not present

## 2016-11-26 DIAGNOSIS — E119 Type 2 diabetes mellitus without complications: Secondary | ICD-10-CM

## 2016-11-26 DIAGNOSIS — Z7982 Long term (current) use of aspirin: Secondary | ICD-10-CM

## 2016-11-26 LAB — BASIC METABOLIC PANEL
Anion gap: 8 (ref 5–15)
BUN: 76 mg/dL — AB (ref 6–20)
CHLORIDE: 115 mmol/L — AB (ref 101–111)
CO2: 22 mmol/L (ref 22–32)
CREATININE: 2.5 mg/dL — AB (ref 0.44–1.00)
Calcium: 12.4 mg/dL — ABNORMAL HIGH (ref 8.9–10.3)
GFR calc Af Amer: 19 mL/min — ABNORMAL LOW (ref >60)
GFR calc non Af Amer: 16 mL/min — ABNORMAL LOW (ref >60)
Glucose, Bld: 185 mg/dL — ABNORMAL HIGH (ref 65–99)
Potassium: 4.8 mmol/L (ref 3.5–5.1)
Sodium: 145 mmol/L (ref 135–145)

## 2016-11-26 LAB — URINALYSIS, ROUTINE W REFLEX MICROSCOPIC
BILIRUBIN URINE: NEGATIVE
GLUCOSE, UA: NEGATIVE mg/dL
KETONES UR: NEGATIVE mg/dL
Leukocytes, UA: NEGATIVE
Nitrite: NEGATIVE
PH: 5 (ref 5.0–8.0)
Protein, ur: 30 mg/dL — AB
SPECIFIC GRAVITY, URINE: 1.011 (ref 1.005–1.030)

## 2016-11-26 LAB — CBC
HCT: 27.4 % — ABNORMAL LOW (ref 36.0–46.0)
Hemoglobin: 9 g/dL — ABNORMAL LOW (ref 12.0–15.0)
MCH: 28.7 pg (ref 26.0–34.0)
MCHC: 32.8 g/dL (ref 30.0–36.0)
MCV: 87.3 fL (ref 78.0–100.0)
PLATELETS: 188 10*3/uL (ref 150–400)
RBC: 3.14 MIL/uL — ABNORMAL LOW (ref 3.87–5.11)
RDW: 16.5 % — AB (ref 11.5–15.5)
WBC: 16 10*3/uL — ABNORMAL HIGH (ref 4.0–10.5)

## 2016-11-26 LAB — HEPATIC FUNCTION PANEL
ALK PHOS: 150 U/L — AB (ref 38–126)
ALT: 23 U/L (ref 14–54)
AST: 57 U/L — AB (ref 15–41)
Albumin: 2.8 g/dL — ABNORMAL LOW (ref 3.5–5.0)
BILIRUBIN DIRECT: 0.2 mg/dL (ref 0.1–0.5)
Indirect Bilirubin: 0.7 mg/dL (ref 0.3–0.9)
TOTAL PROTEIN: 7.1 g/dL (ref 6.5–8.1)
Total Bilirubin: 0.9 mg/dL (ref 0.3–1.2)

## 2016-11-26 LAB — I-STAT CG4 LACTIC ACID, ED: LACTIC ACID, VENOUS: 1.52 mmol/L (ref 0.5–1.9)

## 2016-11-26 LAB — CBG MONITORING, ED: Glucose-Capillary: 175 mg/dL — ABNORMAL HIGH (ref 65–99)

## 2016-11-26 MED ORDER — SODIUM CHLORIDE 0.9 % IV BOLUS (SEPSIS)
1000.0000 mL | Freq: Once | INTRAVENOUS | Status: AC
  Administered 2016-11-26: 1000 mL via INTRAVENOUS

## 2016-11-26 NOTE — ED Notes (Signed)
Tried to find a vein for blood draw. Couldn't find anything.

## 2016-11-26 NOTE — ED Provider Notes (Signed)
[facility] DEPT Provider Note   CSN: [id number] Arrival date & time: 11/26/16  1824     History   Chief Complaint Chief Complaint  Patient presents with  . Weakness    HPI [name] is a 83 y.o. female.  The history is provided by a relative.  Weakness  Primary symptoms comment: generalized weakness, loss of appetite, fatigue, loss of mibility and diffuse pain with movement. This is a new problem. Episode onset: 2 weeks ago. The problem has been gradually worsening. There was no focality noted. There has been no fever. Associated symptoms include altered mental status and confusion. Associated symptoms comments: Slowing. There were no medications administered prior to arrival.    Past Medical History:  Diagnosis Date  . Abdominal pain   . Anemia, unspecified   . Back pain, chronic   . CHF (congestive heart failure) ([place])   . Coronary artery disease   . Depression   . Diabetes mellitus   . Dyslipidemia   . GERD (gastroesophageal reflux disease)   . Gout   . History of cholecystectomy   . History of tonsillectomy   . Hypertension   . IDDM (insulin dependent diabetes mellitus) ([place])   . Renal artery stenosis ([place])   . Tension headache     Patient Active Problem List   Diagnosis Date Noted  . Preventative health care 02/23/2015  . Hematoma 12/17/2014  . Rash and nonspecific skin eruption 12/17/2014  . Breast cancer of upper-outer quadrant of left female breast ([place]) 11/02/2014  . Desmoid tumor 10/21/2014  . Chronic kidney disease, stage III (moderate) 10/20/2014  . Dementia, in, senility 10/20/2014  . Mass of leg 08/25/2014  . CAP (community acquired pneumonia) 05/06/2014  . Symptomatic bradycardia 05/06/2014  . Hyperkalemia 05/06/2014  . Acute on chronic diastolic HF (heart failure) ([place]) 05/05/2014  . Acute respiratory failure with hypoxia ([place]) 05/05/2014  . Community acquired pneumonia 11/11/2013  . Arteriosclerosis of coronary artery 09/28/2013    . Diabetes mellitus type 1.5 ([place]) 09/28/2013  . Acid reflux 09/28/2013  . BP (high blood pressure) 09/28/2013  . Atrophy, Fuchs' 09/06/2013  . Chronic kidney disease, stage IV (severe) ([place]) 02/25/2013  . Debility 01/26/2013  . GLAUCOMA 07/19/2009  . CAD, NATIVE VESSEL 02/16/2009  . RENAL ARTERY STENOSIS 02/10/2009  . HEADACHE, TENSION 07/22/2008  . GOUT 09/17/2007  . UNSPECIFIED ANEMIA 09/17/2007  . BACK PAIN, CHRONIC 09/17/2007  . Type 1 diabetes mellitus ([place]) 07/24/2007  . Hyperlipidemia 07/24/2007  . Depression 07/24/2007  . Essential hypertension 07/24/2007  . GERD 07/24/2007  . Other acquired absence of organ 07/24/2007    Past Surgical History:  Procedure Laterality Date  . CHOLECYSTECTOMY  40 years ago  . TONSILLECTOMY      OB History    No data available       Home Medications    Prior to Admission medications   Medication Sig Start Date End Date Taking? Authorizing Provider  ACCU-CHEK SMARTVIEW test strip USE AS INSTRUCTED TO CHECK BLOOD SUGARS 2 TIMES A DAY 06/04/16   Elayne Snare, MD  allopurinol (ZYLOPRIM) 100 MG tablet TAKE 1 TABLET (100 MG TOTAL) BY MOUTH DAILY. 10/15/16   Biagio Borg, MD  amLODipine (NORVASC) 5 MG tablet Take 1 tablet (5 mg total) by mouth daily. 02/26/16   Imogene Burn, PA-C  anastrozole (ARIMIDEX) 1 MG tablet Take 1 tablet (1 mg total) by mouth daily. 11/01/16   Nicholas Lose, MD  aspirin EC 81 MG tablet Take  81 mg by mouth daily.    Historical Provider, MD  atropine 1 % ophthalmic solution Place 1 drop into the left eye 2 (two) times daily.    Historical Provider, MD  brimonidine (ALPHAGAN P) 0.1 % SOLN Apply 1 drop to eye 2 (two) times daily.    Historical Provider, MD  citalopram (CELEXA) 20 MG tablet Take 1 tablet (20 mg total) by mouth daily. 02/23/15   Biagio Borg, MD  colchicine 0.6 MG tablet Take 1 tablet (0.6 mg total) by mouth 2 (two) times daily as needed (for gout flare up). 01/09/15   Elayne Snare, MD  [employer] D3 1000 units  capsule TAKE ONE CAPSULE BY MOUTH DAILY 09/12/16   Biagio Borg, MD  diazepam (VALIUM) 5 MG tablet Take 1 tablet (5 mg total) by mouth every 6 (six) hours as needed for anxiety. 11/09/15   Biagio Borg, MD  dorzolamide-timolol (COSOPT) 22.3-6.8 MG/ML ophthalmic solution Place 1 drop into both eyes 2 (two) times daily.    Historical Provider, MD  furosemide (LASIX) 20 MG tablet TAKE 1 TABLET BY MOUTH DAILY AS NEEDED FOR EDEMA OR WEIGHT GAIN OF 3 LBS OR MORE IN 1 DAY OR 5LBS IN A WEEK 02/26/16   Imogene Burn, PA-C  gabapentin (NEURONTIN) 600 MG tablet TAKE 1 TABLET BY MOUTH TWICE A DAY 11/20/16   Biagio Borg, MD  hydrALAZINE (APRESOLINE) 100 MG tablet TAKE 1 TABLET (100 MG TOTAL) BY MOUTH 3 (THREE) TIMES DAILY. 07/22/16   Burnell Blanks, MD  insulin glargine (LANTUS) 100 UNIT/ML injection Inject 0.06 mLs (6 Units total) into the skin at bedtime. 01/23/15   Elayne Snare, MD  insulin lispro (HUMALOG) 100 UNIT/ML injection Inject 0.1 mLs (10 Units total) into the skin 2 (two) times daily. 01/23/15   Elayne Snare, MD  metoprolol tartrate (LOPRESSOR) 25 MG tablet Take 1 tablet (25 mg total) by mouth 2 (two) times daily. 02/26/16   Imogene Burn, PA-C  NITROSTAT 0.4 MG SL tablet PLACE 1 TABLET (0.4 MG TOTAL) UNDER THE TONGUE AS NEEDED. FOR CHEST PAIN. 08/27/16   Biagio Borg, MD  nystatin (MYCOSTATIN) powder Use as directed 12/13/14   Biagio Borg, MD  pantoprazole (PROTONIX) 40 MG tablet TAKE 1 TABLET (40 MG TOTAL) BY MOUTH DAILY. 10/24/16   Biagio Borg, MD  potassium chloride (KLOR-CON M10) 10 MEQ tablet TAKE 2 TABLETS (20 MEQ TOTAL) BY MOUTH DAILY. 02/26/16   Imogene Burn, PA-C  pravastatin (PRAVACHOL) 80 MG tablet TAKE 1 TABLET BY MOUTH DAILY 06/03/16   Biagio Borg, MD  QUEtiapine (SEROQUEL) 50 MG tablet TAKE 1 TABLET (50 MG TOTAL) BY MOUTH 2 (TWO) TIMES DAILY. 11/25/16   Biagio Borg, MD    Family History Family History  Problem Relation Age of Onset  . Coronary artery disease Father   .  Hypertension Father   . Heart attack Father   . Hyperlipidemia Father   . Diabetes Sister   . Hypertension Sister   . Hyperlipidemia Sister   . Hypertension Brother   . Diabetes Brother   . Hyperlipidemia Brother   . Cancer Neg Hx     Breast and colon    Social History Social History  Substance Use Topics  . Smoking status: Never Smoker  . Smokeless tobacco: Never Used  . Alcohol use No     Allergies   Patient has no known allergies.   Review of Systems Review of Systems  Neurological:  Positive for weakness.  Psychiatric/Behavioral: Positive for confusion.  All other systems reviewed and are negative.    Physical Exam Updated Vital Signs BP 120/71 (BP Location: Right Arm)   Pulse 71   Temp 99.1 F (37.3 C) (Rectal)   Resp 15   SpO2 97%   Physical Exam  Constitutional: She appears lethargic. She appears cachectic. She appears toxic.  Minimally responsive, moaning  HENT:  Head: Normocephalic.  Nose: Nose normal.  Eyes: Conjunctivae are normal.  Neck: Neck supple. No tracheal deviation present.  Cardiovascular: Normal rate, regular rhythm and normal heart sounds.   Pulmonary/Chest: Effort normal and breath sounds normal. No respiratory distress.  Abdominal: Soft. She exhibits distension (mild). There is no tenderness.  Neurological: She appears lethargic. She is disoriented.  Skin: Skin is warm and dry.     ED Treatments / Results  Labs (all labs ordered are listed, but only abnormal results are displayed) Labs Reviewed  BASIC METABOLIC PANEL - Abnormal; Notable for the following:       Result Value   Chloride 115 (*)    Glucose, Bld 185 (*)    BUN 76 (*)    Creatinine, Ser 2.50 (*)    Calcium 12.4 (*)    GFR calc non Af Amer 16 (*)    GFR calc Af Amer 19 (*)    All other components within normal limits  CBC - Abnormal; Notable for the following:    WBC 16.0 (*)    RBC 3.14 (*)    Hemoglobin 9.0 (*)    HCT 27.4 (*)    RDW 16.5 (*)    All  other components within normal limits  URINALYSIS, ROUTINE W REFLEX MICROSCOPIC - Abnormal; Notable for the following:    APPearance CLOUDY (*)    Hgb urine dipstick SMALL (*)    Protein, ur 30 (*)    Bacteria, UA FEW (*)    Squamous Epithelial / LPF 0-5 (*)    All other components within normal limits  HEPATIC FUNCTION PANEL - Abnormal; Notable for the following:    Albumin 2.8 (*)    AST 57 (*)    Alkaline Phosphatase 150 (*)    All other components within normal limits  CBG MONITORING, ED - Abnormal; Notable for the following:    Glucose-Capillary 175 (*)    All other components within normal limits  CULTURE, BLOOD (ROUTINE X 2)  CULTURE, BLOOD (ROUTINE X 2)  URINE CULTURE  I-STAT CG4 LACTIC ACID, ED    EKG  EKG Interpretation None       Radiology Ct Abdomen Pelvis Wo Contrast  Result Date: 11/26/2016 CLINICAL DATA:  Generalize weakness and increased lethargy. Decreased appetite. Altered mental status. EXAM: CT ABDOMEN AND PELVIS WITHOUT CONTRAST TECHNIQUE: Multidetector CT imaging of the abdomen and pelvis was performed following the standard protocol without IV contrast. COMPARISON:  05/08/2014 FINDINGS: Lower chest: Small bilateral pleural effusions with basilar atelectasis in the lungs. Coronary artery and aortic valve calcifications. There is a lobulated somewhat irregular mass in the left breast measuring 4.4 x 5.9 cm. The patient has a history of breast cancer in the left upper-outer quadrant, likely corresponding to this lesion. This may represent a primary or recurrent lesion, depending on previous treatment history. There appears to be an IV catheter site in the left antecubital fossa which is included on the ages of the film. There appears to be extravasation at the IV site with edema or hematoma in the soft tissues and  soft tissue gas. Vascular calcifications are demonstrated in the brachial artery. Hepatobiliary: Heterogeneous appearance of the liver with multiple  low-attenuation lesions throughout. These are new since previous study and likely represent metastatic disease. Largest lesion is demonstrated in the lateral segment left lobe of the liver, measuring about 5.8 x 8.2 cm. Lesions are not well characterized on noncontrast imaging. Surgical absence of the gallbladder. No bile duct dilatation. Pancreas: Noncontrast appearance is unremarkable. Spleen: Normal in size without focal abnormality. Adrenals/Urinary Tract: Adrenal glands are unremarkable. Kidneys are normal, without renal calculi, focal lesion, or hydronephrosis. Bladder is unremarkable. Stomach/Bowel: Stomach and small bowel are decompressed. Scattered stool throughout the colon without abnormal distention. Colonic diverticulosis. No evidence of diverticulitis. Appendix is not identified. Vascular/Lymphatic: Aortic atherosclerosis. No enlarged abdominal or pelvic lymph nodes. Reproductive: Calcified fibroids in the uterus. No abnormal adnexal masses. Other: No free air or free fluid in the abdomen. Abdominal wall musculature appears intact. Musculoskeletal: Destructive and expansile bone lesions are demonstrated in bilateral ribs, throughout the pelvis, and with a large expansile destructive lesion in the right inferior pubic ramus. This corresponds to the abnormality demonstrated on recent plain film. Changes are consistent with diffuse bone metastasis. Diffuse degenerative changes throughout the spine with small scattered lucencies in vertebra. IMPRESSION: 1. Large lobulated mass in the left breast consistent with breast carcinoma. 2. Multiple liver metastases, new since prior study. 3. Multiple bone metastases, including a large destructive lesion in the right inferior pubic ramus, accounting for plain film finding. 4. IV site in the left upper arm has infiltrated with large hematoma/edema and subcutaneous emphysema. These results were called by telephone at the time of interpretation on 11/26/2016 at 11:19  pm to Dr. Leo Grosser , who verbally acknowledged these results. Electronically Signed   By: Lucienne Capers M.D.   On: 11/26/2016 23:22   Dg Pelvis 1-2 Views  Result Date: 11/26/2016 CLINICAL DATA:  Acute onset of decreased appetite and lethargy. Initial encounter. EXAM: PELVIS - 1-2 VIEW COMPARISON:  CT of the abdomen and pelvis from 05/08/2014 FINDINGS: There is no evidence of fracture or dislocation. There appears to be new absence of much of the right inferior pubic ramus, raising concern for underlying large lytic mass. Both femoral heads are seated normally within their respective acetabula. Mild degenerative change is noted at the lower lumbar spine. The sacroiliac joints are unremarkable in appearance. The visualized bowel gas pattern is grossly unremarkable in appearance. Scattered vascular calcifications are seen. IMPRESSION: No evidence of fracture or dislocation. Apparent new absence of much of the right inferior pubic ramus, raising concern for new underlying large lytic mass. CT or MRI of the pelvis would be helpful for further evaluation, when and as deemed clinically appropriate. Electronically Signed   By: Garald Balding M.D.   On: 11/26/2016 22:49   Ct Head Wo Contrast  Result Date: 11/26/2016 CLINICAL DATA:  Acute onset of generalized weakness and lethargy. Decreased appetite. Assess for brain metastases. Initial encounter. EXAM: CT HEAD WITHOUT CONTRAST TECHNIQUE: Contiguous axial images were obtained from the base of the skull through the vertex without intravenous contrast. COMPARISON:  CT of the head performed 06/13/2008 FINDINGS: Brain: No evidence of acute infarction, hemorrhage, hydrocephalus, extra-axial collection or mass lesion/mass effect. Prominence of the ventricles and sulci reflects mild to moderate cortical volume loss. Mild chronic ischemic change is suggested at the right cerebellar hemisphere. Scattered periventricular and subcortical white matter change likely  reflects small vessel ischemic microangiopathy. Small chronic lacunar infarcts are noted  at the right basal ganglia and left thalamus. The brainstem and fourth ventricle are within normal limits. The cerebral hemispheres demonstrate grossly normal gray-white differentiation. No mass effect or midline shift is seen. Vascular: No hyperdense vessel or unexpected calcification. Skull: There is no evidence of fracture; visualized osseous structures are unremarkable in appearance. Sinuses/Orbits: The orbits are within normal limits. The paranasal sinuses and mastoid air cells are well-aerated. Other: No significant soft tissue abnormalities are seen. IMPRESSION: 1. No acute intracranial pathology seen on CT. Evaluation for metastatic disease is limited on CT, but no abnormal edema is seen. 2. Mild to moderate cortical volume loss and scattered small vessel ischemic microangiopathy. 3. Suggestion of mild chronic ischemic change at the right cerebellar hemisphere. Small chronic lacunar infarcts at the right basal ganglia and left thalamus. Electronically Signed   By: Garald Balding M.D.   On: 11/26/2016 23:52   Dg Abdomen Acute W/chest  Result Date: 11/26/2016 CLINICAL DATA:  Abdominal distention. Patient has been lethargic and not eating. Altered mental status. EXAM: DG ABDOMEN ACUTE W/ 1V CHEST COMPARISON:  Chest 05/09/2014.  CT abdomen and pelvis 05/08/2014 FINDINGS: Shallow inspiration. Cardiac enlargement with mild pulmonary vascular congestion. Infiltration or edema in the lung bases. No blunting of costophrenic angles. No pneumothorax. Calcified and tortuous aorta. Scattered gas and stool in the colon. No small or large bowel distention. No free intra- abdominal air. Surgical clips in the right upper quadrant. Vascular calcifications. Examination of the pelvis is limited by patient rotation, but there appear to be fractures of the right superior and inferior pubic rami which were not present on the previous CT  scan. Acute pelvic fractures are not excluded. IMPRESSION: Cardiac enlargement with vascular congestion and infiltration or edema in the lung bases. Normal nonobstructive bowel gas pattern. No free air. Possible acute fractures of the right superior and inferior pubic rami. Electronically Signed   By: Lucienne Capers M.D.   On: 11/26/2016 21:59    Procedures Procedures (including critical care time)  Medications Ordered in ED Medications  dorzolamide-timolol (COSOPT) 22.3-6.8 MG/ML ophthalmic solution 1 drop (not administered)  atropine 1 % ophthalmic solution 1 drop (not administered)  insulin glargine (LANTUS) injection 6 Units (not administered)  heparin injection 5,000 Units (not administered)  sodium chloride flush (NS) 0.9 % injection 3 mL (not administered)  sodium chloride flush (NS) 0.9 % injection 3 mL (not administered)  0.9 %  sodium chloride infusion (not administered)  ondansetron (ZOFRAN) tablet 4 mg (not administered)    Or  ondansetron (ZOFRAN) injection 4 mg (not administered)  cefTRIAXone (ROCEPHIN) 1 g in dextrose 5 % 50 mL IVPB (not administered)  azithromycin (ZITHROMAX) 500 mg in dextrose 5 % 250 mL IVPB (not administered)  morphine 2 MG/ML injection 1-2 mg (not administered)  LORazepam (ATIVAN) injection 0.5-1 mg (not administered)  0.9 %  sodium chloride infusion (not administered)  hydrALAZINE (APRESOLINE) injection 10 mg (not administered)  insulin aspart (novoLOG) injection 0-9 Units (not administered)  sodium chloride 0.9 % bolus 1,000 mL (0 mLs Intravenous Stopped 11/26/16 2349)     Initial Impression / Assessment and Plan / ED Course  I have reviewed the triage vital signs and the nursing notes.  Pertinent labs & imaging results that were available during my care of the patient were reviewed by me and considered in my medical decision making (see chart for details).     83 y.o. female presents with failure to thrive at home with family. She has had 2  weeks of progressive lethargy and confusion and has been bedridden. Workup shows leukocytosis but no evidence of infection so may be 2/2 dehydration as Pt has been refusing food and water at home. I discussed with Pt's son who states she wished to "not be hooked up to anything" and Pt ws made DNR for comfort care. Further workup shows metastatic breast cancer to bone and liver which is likely underlying etiology and family was updated. Family will be here tomorrow for discussion with IP team and she appears appropriate for hospice. Hospitalist was consulted for admission and will see the patient in the emergency department.   Final Clinical Impressions(s) / ED Diagnoses   Final diagnoses:  Failure to thrive in adult  Leukocytosis, unspecified type  Metastatic breast cancer [facility])    New Prescriptions New Prescriptions   No medications on file     Leo Grosser, MD 11/27/16 [phone]

## 2016-11-26 NOTE — ED Notes (Signed)
In xray

## 2016-11-26 NOTE — ED Triage Notes (Signed)
Via Ems with generalized weakness. Per son pt has been more lethargic than usual. Decreased appetite. Pt alert to verbal stimuli and alert to person which is baseline. Per ems urine had an order. Hx Dementia and CHF. VSS.

## 2016-11-26 NOTE — ED Notes (Signed)
Bed: [id number] Expected date:  Expected time:  Means of arrival:  Comments: 83 yo generalized weakness

## 2016-11-27 DIAGNOSIS — K219 Gastro-esophageal reflux disease without esophagitis: Secondary | ICD-10-CM | POA: Diagnosis present

## 2016-11-27 DIAGNOSIS — M549 Dorsalgia, unspecified: Secondary | ICD-10-CM | POA: Diagnosis present

## 2016-11-27 DIAGNOSIS — J90 Pleural effusion, not elsewhere classified: Secondary | ICD-10-CM | POA: Diagnosis not present

## 2016-11-27 DIAGNOSIS — D649 Anemia, unspecified: Secondary | ICD-10-CM

## 2016-11-27 DIAGNOSIS — C50919 Malignant neoplasm of unspecified site of unspecified female breast: Secondary | ICD-10-CM

## 2016-11-27 DIAGNOSIS — H409 Unspecified glaucoma: Secondary | ICD-10-CM | POA: Diagnosis not present

## 2016-11-27 DIAGNOSIS — L89152 Pressure ulcer of sacral region, stage 2: Secondary | ICD-10-CM | POA: Diagnosis not present

## 2016-11-27 DIAGNOSIS — Z66 Do not resuscitate: Secondary | ICD-10-CM | POA: Diagnosis present

## 2016-11-27 DIAGNOSIS — J189 Pneumonia, unspecified organism: Secondary | ICD-10-CM | POA: Diagnosis not present

## 2016-11-27 DIAGNOSIS — Z7189 Other specified counseling: Secondary | ICD-10-CM

## 2016-11-27 DIAGNOSIS — B952 Enterococcus as the cause of diseases classified elsewhere: Secondary | ICD-10-CM | POA: Diagnosis not present

## 2016-11-27 DIAGNOSIS — Z515 Encounter for palliative care: Secondary | ICD-10-CM | POA: Diagnosis not present

## 2016-11-27 DIAGNOSIS — C7951 Secondary malignant neoplasm of bone: Secondary | ICD-10-CM | POA: Diagnosis present

## 2016-11-27 DIAGNOSIS — D72829 Elevated white blood cell count, unspecified: Secondary | ICD-10-CM

## 2016-11-27 DIAGNOSIS — E1122 Type 2 diabetes mellitus with diabetic chronic kidney disease: Secondary | ICD-10-CM | POA: Diagnosis not present

## 2016-11-27 DIAGNOSIS — I5032 Chronic diastolic (congestive) heart failure: Secondary | ICD-10-CM | POA: Diagnosis not present

## 2016-11-27 DIAGNOSIS — F0391 Unspecified dementia with behavioral disturbance: Secondary | ICD-10-CM | POA: Diagnosis present

## 2016-11-27 DIAGNOSIS — C50412 Malignant neoplasm of upper-outer quadrant of left female breast: Secondary | ICD-10-CM | POA: Diagnosis not present

## 2016-11-27 DIAGNOSIS — R5383 Other fatigue: Secondary | ICD-10-CM | POA: Diagnosis present

## 2016-11-27 DIAGNOSIS — N390 Urinary tract infection, site not specified: Secondary | ICD-10-CM | POA: Diagnosis not present

## 2016-11-27 DIAGNOSIS — C787 Secondary malignant neoplasm of liver and intrahepatic bile duct: Secondary | ICD-10-CM | POA: Diagnosis not present

## 2016-11-27 DIAGNOSIS — G9341 Metabolic encephalopathy: Secondary | ICD-10-CM | POA: Diagnosis not present

## 2016-11-27 DIAGNOSIS — M899 Disorder of bone, unspecified: Secondary | ICD-10-CM

## 2016-11-27 DIAGNOSIS — N184 Chronic kidney disease, stage 4 (severe): Secondary | ICD-10-CM | POA: Diagnosis not present

## 2016-11-27 DIAGNOSIS — M6281 Muscle weakness (generalized): Secondary | ICD-10-CM | POA: Diagnosis not present

## 2016-11-27 DIAGNOSIS — R1312 Dysphagia, oropharyngeal phase: Secondary | ICD-10-CM | POA: Diagnosis not present

## 2016-11-27 DIAGNOSIS — R918 Other nonspecific abnormal finding of lung field: Secondary | ICD-10-CM | POA: Diagnosis not present

## 2016-11-27 DIAGNOSIS — R627 Adult failure to thrive: Secondary | ICD-10-CM | POA: Diagnosis not present

## 2016-11-27 DIAGNOSIS — E861 Hypovolemia: Secondary | ICD-10-CM | POA: Diagnosis present

## 2016-11-27 DIAGNOSIS — I130 Hypertensive heart and chronic kidney disease with heart failure and stage 1 through stage 4 chronic kidney disease, or unspecified chronic kidney disease: Secondary | ICD-10-CM | POA: Diagnosis not present

## 2016-11-27 DIAGNOSIS — J9811 Atelectasis: Secondary | ICD-10-CM | POA: Diagnosis present

## 2016-11-27 DIAGNOSIS — L8990 Pressure ulcer of unspecified site, unspecified stage: Secondary | ICD-10-CM | POA: Insufficient documentation

## 2016-11-27 DIAGNOSIS — E785 Hyperlipidemia, unspecified: Secondary | ICD-10-CM | POA: Diagnosis present

## 2016-11-27 DIAGNOSIS — N179 Acute kidney failure, unspecified: Secondary | ICD-10-CM | POA: Diagnosis not present

## 2016-11-27 DIAGNOSIS — M109 Gout, unspecified: Secondary | ICD-10-CM | POA: Diagnosis present

## 2016-11-27 DIAGNOSIS — L89153 Pressure ulcer of sacral region, stage 3: Secondary | ICD-10-CM | POA: Diagnosis present

## 2016-11-27 DIAGNOSIS — D509 Iron deficiency anemia, unspecified: Secondary | ICD-10-CM | POA: Diagnosis not present

## 2016-11-27 DIAGNOSIS — R488 Other symbolic dysfunctions: Secondary | ICD-10-CM | POA: Diagnosis not present

## 2016-11-27 DIAGNOSIS — Z9049 Acquired absence of other specified parts of digestive tract: Secondary | ICD-10-CM | POA: Diagnosis not present

## 2016-11-27 DIAGNOSIS — E860 Dehydration: Secondary | ICD-10-CM | POA: Diagnosis present

## 2016-11-27 LAB — CBC
HCT: 24.5 % — ABNORMAL LOW (ref 36.0–46.0)
Hemoglobin: 8.1 g/dL — ABNORMAL LOW (ref 12.0–15.0)
MCH: 28.8 pg (ref 26.0–34.0)
MCHC: 33.1 g/dL (ref 30.0–36.0)
MCV: 87.2 fL (ref 78.0–100.0)
PLATELETS: 198 10*3/uL (ref 150–400)
RBC: 2.81 MIL/uL — ABNORMAL LOW (ref 3.87–5.11)
RDW: 16.4 % — AB (ref 11.5–15.5)
WBC: 15.7 10*3/uL — ABNORMAL HIGH (ref 4.0–10.5)

## 2016-11-27 LAB — BASIC METABOLIC PANEL
ANION GAP: 8 (ref 5–15)
BUN: 73 mg/dL — AB (ref 6–20)
CALCIUM: 11.8 mg/dL — AB (ref 8.9–10.3)
CO2: 20 mmol/L — AB (ref 22–32)
CREATININE: 2.32 mg/dL — AB (ref 0.44–1.00)
Chloride: 116 mmol/L — ABNORMAL HIGH (ref 101–111)
GFR calc Af Amer: 21 mL/min — ABNORMAL LOW (ref >60)
GFR, EST NON AFRICAN AMERICAN: 18 mL/min — AB (ref >60)
Glucose, Bld: 195 mg/dL — ABNORMAL HIGH (ref 65–99)
Potassium: 3.9 mmol/L (ref 3.5–5.1)
Sodium: 144 mmol/L (ref 135–145)

## 2016-11-27 LAB — GLUCOSE, CAPILLARY
GLUCOSE-CAPILLARY: 157 mg/dL — AB (ref 65–99)
GLUCOSE-CAPILLARY: 135 mg/dL — AB (ref 65–99)
Glucose-Capillary: 160 mg/dL — ABNORMAL HIGH (ref 65–99)
Glucose-Capillary: 141 mg/dL — ABNORMAL HIGH (ref 65–99)
Glucose-Capillary: 98 mg/dL (ref 65–99)
Glucose-Capillary: 162 mg/dL — ABNORMAL HIGH (ref 65–99)

## 2016-11-27 LAB — INFLUENZA PANEL BY PCR (TYPE A & B)
INFLAPCR: NEGATIVE
Influenza B By PCR: NEGATIVE

## 2016-11-27 LAB — MRSA PCR SCREENING: MRSA by PCR: NEGATIVE

## 2016-11-27 LAB — STREP PNEUMONIAE URINARY ANTIGEN: STREP PNEUMO URINARY ANTIGEN: NEGATIVE

## 2016-11-27 MED ORDER — ATROPINE SULFATE 1 % OP SOLN
1.0000 [drp] | Freq: Two times a day (BID) | OPHTHALMIC | Status: DC
  Administered 2016-11-27 – 2016-12-02 (×12): 1 [drp] via OPHTHALMIC
  Filled 2016-11-27: qty 2

## 2016-11-27 MED ORDER — SODIUM CHLORIDE 0.9% FLUSH: 3.0000 mL | INTRAVENOUS | Status: DC | PRN

## 2016-11-27 MED ORDER — SODIUM CHLORIDE 0.9 % IV SOLN
INTRAVENOUS | Status: AC
  Administered 2016-11-27: 02:00:00 via INTRAVENOUS

## 2016-11-27 MED ORDER — SODIUM CHLORIDE 0.9 % IV SOLN
INTRAVENOUS | Status: DC
  Administered 2016-11-27: 12:00:00 via INTRAVENOUS
  Administered 2016-11-28: 1000 mL via INTRAVENOUS

## 2016-11-27 MED ORDER — INSULIN ASPART 100 UNIT/ML ~~LOC~~ SOLN
0.0000 [IU] | SUBCUTANEOUS | Status: DC
  Administered 2016-11-27: 2 [IU] via SUBCUTANEOUS
  Administered 2016-11-27: 1 [IU] via SUBCUTANEOUS
  Administered 2016-11-27: 2 [IU] via SUBCUTANEOUS
  Administered 2016-11-27: 1 [IU] via SUBCUTANEOUS
  Administered 2016-11-27: 2 [IU] via SUBCUTANEOUS
  Administered 2016-11-28: 1 [IU] via SUBCUTANEOUS
  Administered 2016-11-28 (×3): 2 [IU] via SUBCUTANEOUS

## 2016-11-27 MED ORDER — HEPARIN SODIUM (PORCINE) 5000 UNIT/ML IJ SOLN
5000.0000 [IU] | Freq: Three times a day (TID) | INTRAMUSCULAR | Status: DC
  Administered 2016-11-27 – 2016-12-02 (×13): 5000 [IU] via SUBCUTANEOUS
  Filled 2016-11-27 (×13): qty 1

## 2016-11-27 MED ORDER — ONDANSETRON HCL 4 MG PO TABS: 4.0000 mg | ORAL_TABLET | Freq: Four times a day (QID) | ORAL | Status: DC | PRN

## 2016-11-27 MED ORDER — AZITHROMYCIN 500 MG IV SOLR
500.0000 mg | Freq: Every day | INTRAVENOUS | Status: DC
  Administered 2016-11-27 – 2016-11-28 (×3): 500 mg via INTRAVENOUS
  Filled 2016-11-27 (×4): qty 500

## 2016-11-27 MED ORDER — LORAZEPAM 2 MG/ML IJ SOLN: 0.5000 mg | INTRAMUSCULAR | Status: DC | PRN

## 2016-11-27 MED ORDER — ONDANSETRON HCL 4 MG/2ML IJ SOLN: 4.0000 mg | Freq: Four times a day (QID) | INTRAMUSCULAR | Status: DC | PRN

## 2016-11-27 MED ORDER — FOOD THICKENER (SIMPLYTHICK): 1.0000 | Freq: Three times a day (TID) | ORAL | Status: DC

## 2016-11-27 MED ORDER — DEXTROSE 5 % IV SOLN
1.0000 g | Freq: Every day | INTRAVENOUS | Status: DC
  Administered 2016-11-27 – 2016-12-02 (×6): 1 g via INTRAVENOUS
  Filled 2016-11-27 (×7): qty 10

## 2016-11-27 MED ORDER — INSULIN GLARGINE 100 UNIT/ML ~~LOC~~ SOLN
6.0000 [IU] | Freq: Every day | SUBCUTANEOUS | Status: DC
  Administered 2016-11-27 – 2016-12-02 (×6): 6 [IU] via SUBCUTANEOUS
  Filled 2016-11-27 (×8): qty 0.06

## 2016-11-27 MED ORDER — HYDRALAZINE HCL 20 MG/ML IJ SOLN: 10.0000 mg | INTRAMUSCULAR | Status: DC | PRN

## 2016-11-27 MED ORDER — INSULIN ASPART 100 UNIT/ML ~~LOC~~ SOLN: 0.0000 [IU] | SUBCUTANEOUS | Status: DC

## 2016-11-27 MED ORDER — SODIUM CHLORIDE 0.9 % IV SOLN: 250.0000 mL | INTRAVENOUS | Status: DC | PRN

## 2016-11-27 MED ORDER — STARCH (THICKENING) PO POWD
ORAL | Status: DC | PRN
  Filled 2016-11-27: qty 227

## 2016-11-27 MED ORDER — SODIUM CHLORIDE 0.9% FLUSH
3.0000 mL | Freq: Two times a day (BID) | INTRAVENOUS | Status: DC
  Administered 2016-11-27: 3 mL via INTRAVENOUS

## 2016-11-27 MED ORDER — DORZOLAMIDE HCL-TIMOLOL MAL 2-0.5 % OP SOLN
1.0000 [drp] | Freq: Two times a day (BID) | OPHTHALMIC | Status: DC
  Administered 2016-11-27 – 2016-12-02 (×11): 1 [drp] via OPHTHALMIC
  Filled 2016-11-27: qty 10

## 2016-11-27 MED ORDER — MORPHINE SULFATE (PF) 2 MG/ML IV SOLN: 1.0000 mg | INTRAVENOUS | Status: DC | PRN

## 2016-11-27 NOTE — Progress Notes (Signed)
[facility] FYI patient arrived from ED, hx of MRSA may you order for swab, also can the pending labs be collected at 0500 with the routines?  Paged K. Government social research officer

## 2016-11-27 NOTE — Evaluation (Addendum)
Clinical/Bedside Swallow Evaluation Patient Details  Name: [name] MRN: [id number] Date of Birth: [birth year]  Today's Date: 11/27/2016 Time: SLP Start Time (ACUTE ONLY): [id number] SLP Stop Time (ACUTE ONLY): 1520 SLP Time Calculation (min) (ACUTE ONLY): 27 min  Past Medical History:  Past Medical History:  Diagnosis Date  . Abdominal pain   . Anemia, unspecified   . Back pain, chronic   . CHF (congestive heart failure) ([place])   . Coronary artery disease   . Depression   . Diabetes mellitus   . Dyslipidemia   . GERD (gastroesophageal reflux disease)   . Gout   . History of cholecystectomy   . History of tonsillectomy   . Hypertension   . IDDM (insulin dependent diabetes mellitus) ([name])   . Renal artery stenosis ([name])   . Tension headache    Past Surgical History:  Past Surgical History:  Procedure Laterality Date  . CHOLECYSTECTOMY  40 years ago  . TONSILLECTOMY     HPI:  83 year old female admitted 11/26/16 due to lethargy and decreased responsiveness, significant decline over the past several weeks. PMH significant for dementia, CHF, GERD, IDDM, CAD, HTN, breast cancer with liver/bone mets. CXR reveals cardiomegaly, pulmonary vascular congestion, BLL infiltrate vx edema.   Assessment / Plan / Recommendation Clinical Impression  Oral care completed. Pt awake, but unable to follow commands, but answers some questions. Pt given trials of thin, nectar thick and puree consistencies. Cough response and throat clearing noted following trials of thin liquid. Nectar thick liquid and puree appeared to be tolerated without overt s/s aspiration. Pt is at increased risk of aspiration due to advanced dementia and inability to follow verbal commands. Will begin puree diet and nectar thick liquids. Recommend crushed meds in puree, and adherence to posted precautions. ST will follow for assessment of diet tolerance and education.    Aspiration Risk  Moderate aspiration risk    Diet  Recommendation Dysphagia 1 (Puree);Nectar-thick liquid   Liquid Administration via: Cup;Straw Medication Administration: Crushed with puree Supervision: Full supervision/cueing for compensatory strategies Compensations: Minimize environmental distractions;Slow rate;Small sips/bites Postural Changes: Seated upright at 90 degrees;Remain upright for at least 30 minutes after po intake    Other  Recommendations Oral Care Recommendations: Oral care prior to ice chip/H20   Follow up Recommendations 24 hour supervision/assistance      Frequency and Duration min 1 x/week  2 weeks;1 week       Prognosis Prognosis for Safe Diet Advancement: Guarded Barriers to Reach Goals: Cognitive deficits      Swallow Study   General Date of Onset: 11/26/16 HPI: 83 year old female admitted 11/26/16 due to lethargy and decreased responsiveness, significant decline over the past several weeks. PMH significant for dementia, CHF, GERD, IDDM, CAD, HTN, breast cancer with liver/bone mets. CXR reveals cardiomegaly, pulmonary vascular congestion, BLL infiltrate vx edema. Type of Study: Bedside Swallow Evaluation Previous Swallow Assessment: BSE ordered May 2014, but was not able to be completed. Diet Prior to this Study: Thin liquids;Dysphagia 1 (puree) Temperature Spikes Noted: No Respiratory Status: Room air History of Recent Intubation: No Behavior/Cognition: Alert;Cooperative;Doesn't follow directions;Requires cueing Oral Cavity Assessment: Within Functional Limits Oral Care Completed by SLP: Yes Oral Cavity - Dentition: Edentulous Vision:  (kept eyes closed throughout BSE) Self-Feeding Abilities: Total assist Baseline Vocal Quality: Normal Volitional Cough: Cognitively unable to elicit Volitional Swallow: Unable to elicit    Oral/Motor/Sensory Function Overall Oral Motor/Sensory Function: Within functional limits   Ice Chips Ice chips: Not  tested   Thin Liquid Thin Liquid: Impaired Presentation:  Straw Pharyngeal  Phase Impairments: Cough - Delayed;Suspected delayed Swallow    Nectar Thick Nectar Thick Liquid: Within functional limits Presentation: Cup;Spoon   Honey Thick Honey Thick Liquid: Not tested   Puree Puree: Within functional limits Presentation: [place]. [facility], CCC-SLP [id number] Solid: Not tested        [name] 11/27/2016,3:28 PM

## 2016-11-27 NOTE — Consult Note (Signed)
Consultation Note Date: 11/27/2016   Patient Name: [name]  DOB: 1928/01/10  MRN: [id number]  Age / Sex: 83 y.o., female  PCP: Biagio Borg, MD Referring Physician: Tawni Millers, *  Reason for Consultation: Establishing goals of care  HPI/Patient Profile: 83 y.o. female  with past medical history of Mentioned, chronic diastolic heart failure, insulin-dependent diabetes, coronary disease, hypertension, cancer the left breast (found to have liver AND multiple bone metastases) admitted on 11/26/2016 with significant decline in functional status over the last couple of weeks who is currently being treated for possible pneumonia. Palliative consulted for goals of care  Clinical Assessment and Goals of Care: I met today with 3 of the patient's 5 living children, including her sons, [name] and [name], and her daughter, [name].  We talked about what is most important to her mother. They're very clear that her family has always been the most important thing to her. She is focused all of her life around caring for her children. Her faith is an important thing to her as well however, she has not been able to go to church due to declining health.  We discussed the changes advancing in her nutrition, functional status, and cognition over the past several weeks. They state there is been a very sharp decline over the past couple of weeks in particular. We discussed plan moving forward of continued with gentle medical care including current plan of hydration and antibiotics to see how she responds over the next couple of days. They understand that there is a good likelihood that this is progression of her other chronic disease, including now widely metastatic cancer, and is not going to improve regardless of interventions.   We discussed options moving forward of continuation with current therapy versus refocusing  care on her comfort.  SUMMARY OF RECOMMENDATIONS   - Continue current therapy for another 48 hours to see how she responds. In the event of decompensation, no escalation of care and overall focus should be on her comfort as she approaches the end of her life. - Plan for follow-up family meeting on Friday at 12PM.  Code Status/Advance Care Planning:  DNR   Palliative Prophylaxis:   Delirium Protocol and Frequent Pain Assessment  Psycho-social/Spiritual:   Desire for further Chaplaincy support:no  Additional Recommendations: Education on Hospice  Prognosis:   Unable to determine at this time.  Discharge Planning: To Be Determined      Primary Diagnoses: Present on Admission: . Normocytic anemia . Essential hypertension . Depression . Dementia, in, senility . Breast cancer of upper-outer quadrant of left female breast ([place]) . Acid reflux . Failure to thrive in adult . AKI (acute [name] injury) ([place]) . Chronic [name] disease, stage IV (severe) ([place]) . Chronic diastolic CHF (congestive heart failure) ([place]) . Lytic bone lesions on xray . Liver metastases ([place])   I have reviewed the medical record, interviewed the patient and family, and examined the patient. The following aspects are pertinent.  Past Medical History:  Diagnosis Date  .  Abdominal pain   . Anemia, unspecified   . Back pain, chronic   . CHF (congestive heart failure) ([place])   . Coronary artery disease   . Depression   . Diabetes mellitus   . Dyslipidemia   . GERD (gastroesophageal reflux disease)   . Gout   . History of cholecystectomy   . History of tonsillectomy   . Hypertension   . IDDM (insulin dependent diabetes mellitus) ([place])   . Renal artery stenosis ([employer])   . Tension headache    Social History   Social History  . Marital status: Widowed    Spouse name: N/A  . Number of children: 6  . Years of education: N/A   Occupational History  . Retired Retired   Social History Main  Topics  . Smoking status: Never Smoker  . Smokeless tobacco: Never Used  . Alcohol use No  . Drug use: No  . Sexual activity: Not Currently   Other Topics Concern  . None   Social History Narrative   Married over 50 years.   8th grade-went to work.      Physician Roster:    Jaclyn PrimeDwyane Dee   Cardo- McAlhaney   Opthal- Groat   Family History  Problem Relation Age of Onset  . Coronary artery disease Father   . Hypertension Father   . Heart attack Father   . Hyperlipidemia Father   . Diabetes Sister   . Hypertension Sister   . Hyperlipidemia Sister   . Hypertension Brother   . Diabetes Brother   . Hyperlipidemia Brother   . Cancer Neg Hx     Breast and colon   Scheduled Meds: . atropine  1 drop Left Eye BID  . azithromycin  500 mg Intravenous QHS  . cefTRIAXone (ROCEPHIN)  IV  1 g Intravenous QHS  . dorzolamide-timolol  1 drop Both Eyes BID  . heparin  5,000 Units Subcutaneous Q8H  . insulin aspart  0-9 Units Subcutaneous Q4H  . insulin glargine  6 Units Subcutaneous QHS  . sodium chloride flush  3 mL Intravenous Q12H   Continuous Infusions: . sodium chloride     PRN Meds:.sodium chloride, hydrALAZINE, LORazepam, morphine injection, ondansetron **OR** ondansetron (ZOFRAN) IV, sodium chloride flush Medications Prior to Admission:  Prior to Admission medications   Medication Sig Start Date End Date Taking? Authorizing Provider  allopurinol (ZYLOPRIM) 100 MG tablet TAKE 1 TABLET (100 MG TOTAL) BY MOUTH DAILY. 10/15/16   Biagio Borg, MD  amLODipine (NORVASC) 5 MG tablet Take 1 tablet (5 mg total) by mouth daily. 02/26/16   Imogene Burn, PA-C  anastrozole (ARIMIDEX) 1 MG tablet Take 1 tablet (1 mg total) by mouth daily. 11/01/16   Nicholas Lose, MD  aspirin EC 81 MG tablet Take 81 mg by mouth daily.    Historical Provider, MD  atropine 1 % ophthalmic solution Place 1 drop into the left eye 2 (two) times daily.    Historical Provider, MD  brimonidine (ALPHAGAN P) 0.1 % SOLN  Apply 1 drop to eye 2 (two) times daily.    Historical Provider, MD  citalopram (CELEXA) 20 MG tablet Take 1 tablet (20 mg total) by mouth daily. 02/23/15   Biagio Borg, MD  colchicine 0.6 MG tablet Take 1 tablet (0.6 mg total) by mouth 2 (two) times daily as needed (for gout flare up). 01/09/15   Elayne Snare, MD  [facility] D3 1000 units capsule TAKE ONE CAPSULE BY MOUTH DAILY 09/12/16  Biagio Borg, MD  diazepam (VALIUM) 5 MG tablet Take 1 tablet (5 mg total) by mouth every 6 (six) hours as needed for anxiety. 11/09/15   Biagio Borg, MD  dorzolamide-timolol (COSOPT) 22.3-6.8 MG/ML ophthalmic solution Place 1 drop into both eyes 2 (two) times daily.    Historical Provider, MD  furosemide (LASIX) 20 MG tablet TAKE 1 TABLET BY MOUTH DAILY AS NEEDED FOR EDEMA OR WEIGHT GAIN OF 3 LBS OR MORE IN 1 DAY OR 5LBS IN A WEEK 02/26/16   Imogene Burn, PA-C  gabapentin (NEURONTIN) 600 MG tablet TAKE 1 TABLET BY MOUTH TWICE A DAY 11/20/16   Biagio Borg, MD  hydrALAZINE (APRESOLINE) 100 MG tablet TAKE 1 TABLET (100 MG TOTAL) BY MOUTH 3 (THREE) TIMES DAILY. 07/22/16   Burnell Blanks, MD  insulin glargine (LANTUS) 100 UNIT/ML injection Inject 0.06 mLs (6 Units total) into the skin at bedtime. 01/23/15   Elayne Snare, MD  insulin lispro (HUMALOG) 100 UNIT/ML injection Inject 0.1 mLs (10 Units total) into the skin 2 (two) times daily. 01/23/15   Elayne Snare, MD  metoprolol tartrate (LOPRESSOR) 25 MG tablet Take 1 tablet (25 mg total) by mouth 2 (two) times daily. 02/26/16   Imogene Burn, PA-C  NITROSTAT 0.4 MG SL tablet PLACE 1 TABLET (0.4 MG TOTAL) UNDER THE TONGUE AS NEEDED. FOR CHEST PAIN. 08/27/16   Biagio Borg, MD  nystatin (MYCOSTATIN) powder Use as directed 12/13/14   Biagio Borg, MD  pantoprazole (PROTONIX) 40 MG tablet TAKE 1 TABLET (40 MG TOTAL) BY MOUTH DAILY. 10/24/16   Biagio Borg, MD  potassium chloride (KLOR-CON M10) 10 MEQ tablet TAKE 2 TABLETS (20 MEQ TOTAL) BY MOUTH DAILY. 02/26/16   Imogene Burn, PA-C    pravastatin (PRAVACHOL) 80 MG tablet TAKE 1 TABLET BY MOUTH DAILY 06/03/16   Biagio Borg, MD  QUEtiapine (SEROQUEL) 50 MG tablet TAKE 1 TABLET (50 MG TOTAL) BY MOUTH 2 (TWO) TIMES DAILY. 11/25/16   Biagio Borg, MD   No Known Allergies Review of Systems  Unable to obtain  Physical Exam  General: Somnolent.  Does not arouse to verbal or tactile stimulation Heart: Regular rate and rhythm. No murmur appreciated. Lungs: Diminished air movement, scattered rales Abdomen: Soft, nontender, nondistended, positive bowel sounds.  Ext: No significant edema Skin: Warm and dry   Vital Signs: BP (!) 126/55 (BP Location: Right Arm)   Pulse 73   Temp 98.5 F (36.9 C) (Axillary)   Resp 18   SpO2 100%  Pain Assessment: Faces       SpO2: SpO2: 100 % O2 Device:SpO2: 100 % O2 Flow Rate: .   IO: Intake/output summary:  Intake/Output Summary (Last 24 hours) at 11/27/16 1216 Last data filed at 11/27/16 0600  Gross per 24 hour  Intake              660 ml  Output                0 ml  Net              660 ml    LBM: Last BM Date:  (PTA) Baseline Weight:   Most recent weight:       Palliative Assessment/Data:   Flowsheet Rows   Flowsheet Row Most Recent Value  Intake Tab  Referral Department  Hospitalist  Unit at Time of Referral  Oncology Unit  Palliative Care Primary Diagnosis  Cancer  Date Notified  11/27/16  Palliative Care Type  New Palliative care  Reason for referral  Clarify Goals of Care  Date of Admission  11/26/16  Date first seen by Palliative Care  11/27/16  # of days Palliative referral response time  0 Day(s)  # of days IP prior to Palliative referral  1  Clinical Assessment  Palliative Performance Scale Score  10%  Pain Max last 24 hours  Not able to report  Pain Min Last 24 hours  Not able to report  Psychosocial & Spiritual Assessment  Palliative Care Outcomes  Patient/Family meeting held?  Yes  Who was at the meeting?  3 of her 5 living children [name],  Peter Minium)  Palliative Care Outcomes  Clarified goals of care      Time In: 1110 Time Out: 1210 Time Total: 60 Greater than 50%  of this time was spent counseling and coordinating care related to the above assessment and plan.  Signed by: Micheline Rough, MD   Please contact Palliative Medicine Team phone at [phone] for questions and concerns.  For individual provider: See Shea Evans

## 2016-11-27 NOTE — H&P (Signed)
History and Physical    [name] [id number] DOB: 1927-12-29 DOA: 11/26/2016  PCP: Cathlean Cower, MD   Patient coming from: Home  Chief Complaint: Poorly responsive   HPI: [name] is a 83 y.o. female with medical history significant for dementia, chronic diastolic CHF, insulin-dependent diabetes mellitus, coronary artery disease, hypertension, and cancer of the left breast to presents to the emergency department for evaluation of lethargy and poor responsiveness. History is obtained through discussion with the ED personnel, patient's son, and review of the EMR. Per family, patient has unfortunately had a progressive and severe decline over the past several weeks. He began refusing food and water intermittently, then consistently over the past week. She stopped attempts at communicating, but continued to respond to verbal stimuli until yesterday. Patient is cared for by her children at home, but has reportedly been poorly responsive for the past couple days, prompting EMS to be activated for transport to the hospital. There was no recent fall or head trauma reported and no vomiting or diarrhea has been observed. Patient has not been able to vocalize or localize any pain.  ED Course: Upon arrival to the ED, patient is found to be afebrile, saturating adequately on room air, and with vital signs stable. Chest x-ray reveals cardiomegaly with pulmonary vascular congestion and infiltrate versus edema in the bases. KUB features a normal gas pattern but an apparent lytic lesion is noted in the pelvis.CT of the abdomen and pelvis reveals a large lobulated mass in the left breast consistent with breast carcinoma, as well as multiple new liver metastases and multiple bone metastases. Head CT is negative for acute intracranial abnormality. Chemistry panel reveals a BUN of 76 and creatinine of 2.50, up from an apparent baseline of 1.8. Calcium is elevated to 12.4. CBC features a leukocytosis to  16,000 and a stable normocytic anemia with hemoglobin 9.0. Urinalysis is not suggestive of infection and lactic acid is reassuring at 1.52. Patient was treated with 1 L of normal saline in the emergency department and blood and urine cultures were obtained. She is remained hemodynamically stable and not in acute distress, but very poorly responsive. She will be admitted to medical/surgical unit for ongoing evaluation and management.  Review of Systems:  Unable to obtain ROS secondary to patient's clinical condition with advanced dementia and nonverbal state.  Past Medical History:  Diagnosis Date  . Abdominal pain   . Anemia, unspecified   . Back pain, chronic   . CHF (congestive heart failure) ([place])   . Coronary artery disease   . Depression   . Diabetes mellitus   . Dyslipidemia   . GERD (gastroesophageal reflux disease)   . Gout   . History of cholecystectomy   . History of tonsillectomy   . Hypertension   . IDDM (insulin dependent diabetes mellitus) ([place])   . Renal artery stenosis ([place])   . Tension headache     Past Surgical History:  Procedure Laterality Date  . CHOLECYSTECTOMY  40 years ago  . TONSILLECTOMY       reports that she has never smoked. She has never used smokeless tobacco. She reports that she does not drink alcohol or use drugs.  No Known Allergies  Family History  Problem Relation Age of Onset  . Coronary artery disease Father   . Hypertension Father   . Heart attack Father   . Hyperlipidemia Father   . Diabetes Sister   . Hypertension Sister   . Hyperlipidemia Sister   .  Hypertension Brother   . Diabetes Brother   . Hyperlipidemia Brother   . Cancer Neg Hx     Breast and colon     Prior to Admission medications   Medication Sig Start Date End Date Taking? Authorizing Provider  ACCU-CHEK SMARTVIEW test strip USE AS INSTRUCTED TO CHECK BLOOD SUGARS 2 TIMES A DAY 06/04/16   Elayne Snare, MD  allopurinol (ZYLOPRIM) 100 MG tablet TAKE 1 TABLET (100  MG TOTAL) BY MOUTH DAILY. 10/15/16   Biagio Borg, MD  amLODipine (NORVASC) 5 MG tablet Take 1 tablet (5 mg total) by mouth daily. 02/26/16   Imogene Burn, PA-C  anastrozole (ARIMIDEX) 1 MG tablet Take 1 tablet (1 mg total) by mouth daily. 11/01/16   Nicholas Lose, MD  aspirin EC 81 MG tablet Take 81 mg by mouth daily.    Historical Provider, MD  atropine 1 % ophthalmic solution Place 1 drop into the left eye 2 (two) times daily.    Historical Provider, MD  brimonidine (ALPHAGAN P) 0.1 % SOLN Apply 1 drop to eye 2 (two) times daily.    Historical Provider, MD  citalopram (CELEXA) 20 MG tablet Take 1 tablet (20 mg total) by mouth daily. 02/23/15   Biagio Borg, MD  colchicine 0.6 MG tablet Take 1 tablet (0.6 mg total) by mouth 2 (two) times daily as needed (for gout flare up). 01/09/15   Elayne Snare, MD  [employer] D3 1000 units capsule TAKE ONE CAPSULE BY MOUTH DAILY 09/12/16   Biagio Borg, MD  diazepam (VALIUM) 5 MG tablet Take 1 tablet (5 mg total) by mouth every 6 (six) hours as needed for anxiety. 11/09/15   Biagio Borg, MD  dorzolamide-timolol (COSOPT) 22.3-6.8 MG/ML ophthalmic solution Place 1 drop into both eyes 2 (two) times daily.    Historical Provider, MD  furosemide (LASIX) 20 MG tablet TAKE 1 TABLET BY MOUTH DAILY AS NEEDED FOR EDEMA OR WEIGHT GAIN OF 3 LBS OR MORE IN 1 DAY OR 5LBS IN A WEEK 02/26/16   Imogene Burn, PA-C  gabapentin (NEURONTIN) 600 MG tablet TAKE 1 TABLET BY MOUTH TWICE A DAY 11/20/16   Biagio Borg, MD  hydrALAZINE (APRESOLINE) 100 MG tablet TAKE 1 TABLET (100 MG TOTAL) BY MOUTH 3 (THREE) TIMES DAILY. 07/22/16   Burnell Blanks, MD  insulin glargine (LANTUS) 100 UNIT/ML injection Inject 0.06 mLs (6 Units total) into the skin at bedtime. 01/23/15   Elayne Snare, MD  insulin lispro (HUMALOG) 100 UNIT/ML injection Inject 0.1 mLs (10 Units total) into the skin 2 (two) times daily. 01/23/15   Elayne Snare, MD  metoprolol tartrate (LOPRESSOR) 25 MG tablet Take 1 tablet (25 mg total) by  mouth 2 (two) times daily. 02/26/16   Imogene Burn, PA-C  NITROSTAT 0.4 MG SL tablet PLACE 1 TABLET (0.4 MG TOTAL) UNDER THE TONGUE AS NEEDED. FOR CHEST PAIN. 08/27/16   Biagio Borg, MD  nystatin (MYCOSTATIN) powder Use as directed 12/13/14   Biagio Borg, MD  pantoprazole (PROTONIX) 40 MG tablet TAKE 1 TABLET (40 MG TOTAL) BY MOUTH DAILY. 10/24/16   Biagio Borg, MD  potassium chloride (KLOR-CON M10) 10 MEQ tablet TAKE 2 TABLETS (20 MEQ TOTAL) BY MOUTH DAILY. 02/26/16   Imogene Burn, PA-C  pravastatin (PRAVACHOL) 80 MG tablet TAKE 1 TABLET BY MOUTH DAILY 06/03/16   Biagio Borg, MD  QUEtiapine (SEROQUEL) 50 MG tablet TAKE 1 TABLET (50 MG TOTAL) BY MOUTH 2 (  TWO) TIMES DAILY. 11/25/16   Biagio Borg, MD    Physical Exam: Vitals:   11/26/16 1831 11/26/16 1832 11/26/16 2124 11/26/16 2318  BP: 126/64  120/71 132/72  Pulse: 66  71 76  Resp: 16  15 18   Temp: 98.5 F (36.9 C)  99.1 F (37.3 C)   TempSrc: Axillary  Rectal   SpO2: 97% 99% 97% 97%      Constitutional: No acute respiratory distress, obtunded, chronically-ill appearing Eyes: PERTLA, lids and conjunctivae normal ENMT: Mucous membranes are dry. Posterior pharynx clear of any exudate or lesions.   Neck: normal, supple, no masses, no thyromegaly Respiratory:  Bibasilar rales. Normal respiratory effort. No accessory muscle use.  Cardiovascular: S1 & S2 heard, regular rate and rhythm. 2+ edema in BLE's with fine wrinkling. No significant JVD. Abdomen: No distension, no tenderness, no masses palpated. Bowel sounds normal.  Musculoskeletal: no clubbing / cyanosis. No joint deformity upper and lower extremities.   Skin: no significant rashes, lesions, ulcers. Warm, dry, well-perfused. Neurologic: No gross facial asymmetry. Withdraws from pain only. Babinski response down-going bilaterally.  Psychiatric: Unable to assess given the clinical condition.     Labs on Admission: I have personally reviewed following labs and imaging  studies  CBC:  Recent Labs Lab 11/26/16 2101  WBC 16.0*  HGB 9.0*  HCT 27.4*  MCV 87.3  PLT 0000000   Basic Metabolic Panel:  Recent Labs Lab 11/26/16 2101  NA 145  K 4.8  CL 115*  CO2 22  GLUCOSE 185*  BUN 76*  CREATININE 2.50*  CALCIUM 12.4*   GFR: CrCl cannot be calculated (Unknown ideal weight.). Liver Function Tests:  Recent Labs Lab 11/26/16 2101  AST 57*  ALT 23  ALKPHOS 150*  BILITOT 0.9  PROT 7.1  ALBUMIN 2.8*   No results for input(s): LIPASE, AMYLASE in the last 168 hours. No results for input(s): AMMONIA in the last 168 hours. Coagulation Profile: No results for input(s): INR, PROTIME in the last 168 hours. Cardiac Enzymes: No results for input(s): CKTOTAL, CKMB, CKMBINDEX, TROPONINI in the last 168 hours. BNP (last 3 results) No results for input(s): PROBNP in the last 8760 hours. HbA1C: No results for input(s): HGBA1C in the last 72 hours. CBG:  Recent Labs Lab 11/26/16 1933  GLUCAP 175*   Lipid Profile: No results for input(s): CHOL, HDL, LDLCALC, TRIG, CHOLHDL, LDLDIRECT in the last 72 hours. Thyroid Function Tests: No results for input(s): TSH, T4TOTAL, FREET4, T3FREE, THYROIDAB in the last 72 hours. Anemia Panel: No results for input(s): VITAMINB12, FOLATE, FERRITIN, TIBC, IRON, RETICCTPCT in the last 72 hours. Urine analysis:    Component Value Date/Time   COLORURINE YELLOW 11/26/2016 2205   APPEARANCEUR CLOUDY (A) 11/26/2016 2205   LABSPEC 1.011 11/26/2016 2205   PHURINE 5.0 11/26/2016 2205   GLUCOSEU NEGATIVE 11/26/2016 2205   GLUCOSEU NEGATIVE 11/09/2015 1645   HGBUR SMALL (A) 11/26/2016 2205   BILIRUBINUR NEGATIVE 11/26/2016 2205   KETONESUR NEGATIVE 11/26/2016 2205   PROTEINUR 30 (A) 11/26/2016 2205   UROBILINOGEN 0.2 11/09/2015 1645   NITRITE NEGATIVE 11/26/2016 2205   LEUKOCYTESUR NEGATIVE 11/26/2016 2205   Sepsis Labs: @LABRCNTIP (procalcitonin:4,lacticidven:4) )No results found for this or any previous visit  (from the past 240 hour(s)).   Radiological Exams on Admission: Ct Abdomen Pelvis Wo Contrast  Result Date: 11/26/2016 CLINICAL DATA:  Generalize weakness and increased lethargy. Decreased appetite. Altered mental status. EXAM: CT ABDOMEN AND PELVIS WITHOUT CONTRAST TECHNIQUE: Multidetector CT imaging of the abdomen and  pelvis was performed following the standard protocol without IV contrast. COMPARISON:  05/08/2014 FINDINGS: Lower chest: Small bilateral pleural effusions with basilar atelectasis in the lungs. Coronary artery and aortic valve calcifications. There is a lobulated somewhat irregular mass in the left breast measuring 4.4 x 5.9 cm. The patient has a history of breast cancer in the left upper-outer quadrant, likely corresponding to this lesion. This may represent a primary or recurrent lesion, depending on previous treatment history. There appears to be an IV catheter site in the left antecubital fossa which is included on the ages of the film. There appears to be extravasation at the IV site with edema or hematoma in the soft tissues and soft tissue gas. Vascular calcifications are demonstrated in the brachial artery. Hepatobiliary: Heterogeneous appearance of the liver with multiple low-attenuation lesions throughout. These are new since previous study and likely represent metastatic disease. Largest lesion is demonstrated in the lateral segment left lobe of the liver, measuring about 5.8 x 8.2 cm. Lesions are not well characterized on noncontrast imaging. Surgical absence of the gallbladder. No bile duct dilatation. Pancreas: Noncontrast appearance is unremarkable. Spleen: Normal in size without focal abnormality. Adrenals/Urinary Tract: Adrenal glands are unremarkable. Kidneys are normal, without renal calculi, focal lesion, or hydronephrosis. Bladder is unremarkable. Stomach/Bowel: Stomach and small bowel are decompressed. Scattered stool throughout the colon without abnormal distention.  Colonic diverticulosis. No evidence of diverticulitis. Appendix is not identified. Vascular/Lymphatic: Aortic atherosclerosis. No enlarged abdominal or pelvic lymph nodes. Reproductive: Calcified fibroids in the uterus. No abnormal adnexal masses. Other: No free air or free fluid in the abdomen. Abdominal wall musculature appears intact. Musculoskeletal: Destructive and expansile bone lesions are demonstrated in bilateral ribs, throughout the pelvis, and with a large expansile destructive lesion in the right inferior pubic ramus. This corresponds to the abnormality demonstrated on recent plain film. Changes are consistent with diffuse bone metastasis. Diffuse degenerative changes throughout the spine with small scattered lucencies in vertebra. IMPRESSION: 1. Large lobulated mass in the left breast consistent with breast carcinoma. 2. Multiple liver metastases, new since prior study. 3. Multiple bone metastases, including a large destructive lesion in the right inferior pubic ramus, accounting for plain film finding. 4. IV site in the left upper arm has infiltrated with large hematoma/edema and subcutaneous emphysema. These results were called by telephone at the time of interpretation on 11/26/2016 at 11:19 pm to Dr. Leo Grosser , who verbally acknowledged these results. Electronically Signed   By: Lucienne Capers M.D.   On: 11/26/2016 23:22   Dg Pelvis 1-2 Views  Result Date: 11/26/2016 CLINICAL DATA:  Acute onset of decreased appetite and lethargy. Initial encounter. EXAM: PELVIS - 1-2 VIEW COMPARISON:  CT of the abdomen and pelvis from 05/08/2014 FINDINGS: There is no evidence of fracture or dislocation. There appears to be new absence of much of the right inferior pubic ramus, raising concern for underlying large lytic mass. Both femoral heads are seated normally within their respective acetabula. Mild degenerative change is noted at the lower lumbar spine. The sacroiliac joints are unremarkable in  appearance. The visualized bowel gas pattern is grossly unremarkable in appearance. Scattered vascular calcifications are seen. IMPRESSION: No evidence of fracture or dislocation. Apparent new absence of much of the right inferior pubic ramus, raising concern for new underlying large lytic mass. CT or MRI of the pelvis would be helpful for further evaluation, when and as deemed clinically appropriate. Electronically Signed   By: Garald Balding M.D.   On: 11/26/2016  22:49   Ct Head Wo Contrast  Result Date: 11/26/2016 CLINICAL DATA:  Acute onset of generalized weakness and lethargy. Decreased appetite. Assess for brain metastases. Initial encounter. EXAM: CT HEAD WITHOUT CONTRAST TECHNIQUE: Contiguous axial images were obtained from the base of the skull through the vertex without intravenous contrast. COMPARISON:  CT of the head performed 06/13/2008 FINDINGS: Brain: No evidence of acute infarction, hemorrhage, hydrocephalus, extra-axial collection or mass lesion/mass effect. Prominence of the ventricles and sulci reflects mild to moderate cortical volume loss. Mild chronic ischemic change is suggested at the right cerebellar hemisphere. Scattered periventricular and subcortical white matter change likely reflects small vessel ischemic microangiopathy. Small chronic lacunar infarcts are noted at the right basal ganglia and left thalamus. The brainstem and fourth ventricle are within normal limits. The cerebral hemispheres demonstrate grossly normal gray-white differentiation. No mass effect or midline shift is seen. Vascular: No hyperdense vessel or unexpected calcification. Skull: There is no evidence of fracture; visualized osseous structures are unremarkable in appearance. Sinuses/Orbits: The orbits are within normal limits. The paranasal sinuses and mastoid air cells are well-aerated. Other: No significant soft tissue abnormalities are seen. IMPRESSION: 1. No acute intracranial pathology seen on CT.  Evaluation for metastatic disease is limited on CT, but no abnormal edema is seen. 2. Mild to moderate cortical volume loss and scattered small vessel ischemic microangiopathy. 3. Suggestion of mild chronic ischemic change at the right cerebellar hemisphere. Small chronic lacunar infarcts at the right basal ganglia and left thalamus. Electronically Signed   By: Garald Balding M.D.   On: 11/26/2016 23:52   Dg Abdomen Acute W/chest  Result Date: 11/26/2016 CLINICAL DATA:  Abdominal distention. Patient has been lethargic and not eating. Altered mental status. EXAM: DG ABDOMEN ACUTE W/ 1V CHEST COMPARISON:  Chest 05/09/2014.  CT abdomen and pelvis 05/08/2014 FINDINGS: Shallow inspiration. Cardiac enlargement with mild pulmonary vascular congestion. Infiltration or edema in the lung bases. No blunting of costophrenic angles. No pneumothorax. Calcified and tortuous aorta. Scattered gas and stool in the colon. No small or large bowel distention. No free intra- abdominal air. Surgical clips in the right upper quadrant. Vascular calcifications. Examination of the pelvis is limited by patient rotation, but there appear to be fractures of the right superior and inferior pubic rami which were not present on the previous CT scan. Acute pelvic fractures are not excluded. IMPRESSION: Cardiac enlargement with vascular congestion and infiltration or edema in the lung bases. Normal nonobstructive bowel gas pattern. No free air. Possible acute fractures of the right superior and inferior pubic rami. Electronically Signed   By: Lucienne Capers M.D.   On: 11/26/2016 21:59    EKG: Not performed, will obtain as appropriate.   Assessment/Plan  1. Failure to thrive - Pt with hx of dementia, cared for by family who report significant decline over last several weeks  - She has been increasing lethargic, refusing food and drink, decreasing responsiveness  - Likely secondary to advancing dementia and progressive metastatic  disease; prognosis is very poor  - Goals of care discussed briefly with son by phone; he confirms her wishes to be DNR/DNI and favors a palliative approach to her care; he plans to be in the hospital 11/27/16 and understands she may expire overnight - Palliative consultation requested    2. Breast cancer, liver and bone metastases  - Pt with hx of left breast cancer, not surgical candidate, started on Arimidex in January 2016 - Imaging in ED reveals multiple new liver and skeletal  metastasis  - Arimidex held  - Findings discussed with patient's son by phone   3. Dementia  - Advanced; nonverbal and has been refusing food and water   4. Acute kidney injury superimposed on CKD stage IV  - SCr is 2.50 on admission, up from apparent baseline of ~1.8  - Likely prerenal azotemia in setting of hypovolemia  - She was given 1 liter NS in ED and is continued on a gentle IVF hydration - Avoid nephrotoxins, renally-dose medications, repeat chemistries in am    5. Chronic diastolic CHF  - Pt appears dehydrated on admission, but vascular congestion and possible edema noted on CXR - JVP is not elevated, no gallop appreciated  - TTE (11/11/13) with EF 0000000, grade 2 diastolic dysfunction, mild-mod LVH, mild-mod AS, mild MR, mod-severe LAE  - She was given 1 liter NS in ED and is continued on gentle IVF hydration  - Follow I/O's, weight; Lasix held    6. Insulin-dependent DM  - A1c only 5.6% in September 2017  - Managed at home with Lantus 6 units qHS and Humalog 10 units BID  - Check CBG q4h, continue Lantus 6 units qHS, and start a low-intensity SSI    7. Leukocytosis, ?PNA  - Presents with leukocytosis to 16,000 without fever or tachycardia; lactate wnl  - Could be secondary to the metastatic disease; basilar infiltrate on CXR possibly edema - Blood cultures obtained in ED - Will start empiric Rocephin and azithromycin    8. Normocytic anemia  - Hgb is 9.0 on admission - This is stable  relative to priors in 9-10 range and with no sign of active blood-loss    9. Hypertension - BP at goal on admission - Managed with hydralazine, Norvasc, and Lopressor at home; currently held while NPO  - Monitor, use hydralazine IVP's prn    DVT prophylaxis: sq heparin  Code Status: DNR Family Communication: Son updated by phone, plans to be here tomorrow, understands she may expire  Disposition Plan: Admit to med-surg Consults called: None Admission status: Inpatient    Vianne Bulls, MD [facility] Pager [phone]  If 7PM-7AM, please contact night-coverage [email or web address] Password [facility]  11/27/2016, 12:24 AM

## 2016-11-27 NOTE — Progress Notes (Addendum)
PROGRESS NOTE    [name]  [id number] DOB: [birth year] DOA: 11/26/2016 PCP: Cathlean Cower, MD   Brief Narrative:  This is a 83 yo female with advanced dementia and metastatic breast cancer, presents to the hospital with decreased responsiveness and lethargy. Her symptoms been ongoing for about 3 weeks with significant decrease in her by mouth intake, now patient is nonambulatory. Patient was found to have a possible pneumonia, started on antibiotics and IV fluids, palliative care has been consulted.   Assessment & Plan:   Principal Problem:   Failure to thrive in adult Active Problems:   Insulin dependent diabetes mellitus (HCC)   Normocytic anemia   Depression   Essential hypertension   Chronic kidney disease, stage IV (severe) (HCC)   Chronic diastolic CHF (congestive heart failure) (HCC)   Breast cancer of upper-outer quadrant of left female breast (HCC)   Dementia, in, senility   Acid reflux   AKI (acute kidney injury) ([place])   Lytic bone lesions on xray   Liver metastases (HCC)   Leukocytosis   Metastatic breast cancer (HCC)   Pressure injury of skin  1. Metabolic encephalopathy. Patient not interactive, does not follow commands, localizes pain. Moves all 4 extremities. Will continue supportive care with IV fluids, neuro checks per unit protocol. Will request EEG. May need neurology evaluation.  Head CT with chronic changes of old CVAs. Possible progressive dementia. Will hold on lorazepam for now.   2. Possible community acquired pneumonia. More likely atelectasis, personally reviewed chest x ray and lung windows of abdominal CT. Will continue antibiotic for the next 24 hours and follow up chest film in am, will have a low threshold to discontinue antibiotics. Check swallow evaluation.   3. Breast cancer with mets. CT with multiple liver and bone mets. Will continue conservative care, palliative care service has been consulted. Poor prognosis. CT head with no signs of  brain mets.   4. AKI on ckd stage IV. Renal function with cr at 2,3 from 2,5 will continue gentle hydration with IV fluids K at 3,9 and serum bicarb at 20. Will follow up on renal function and electrolytes in am.   5. Chronic diastolic heart failure. No signs of hypervolemia, will continue as needed hydrazine for blood pressure control.   6, T2DM. Continue glucose cover and monitoring with insulin sliding scale, capillary glucose  160, 141, 98. 6 units of levimir at night.   7. HTN. Will continue hydralazine as needed for blood pressure control.   8. Anemia multifactorial. Hb at 8,1 with hct at 24, no signs of bleeding, will check iron panel.     DVT prophylaxis: heparin  Code Status: dnr Family Communication: I spoke with patient's family at the bedside and all questions were addressed.   Disposition Plan: snf   Consultants:   Palliative care   Procedures:     Antimicrobials:   Ceftriaxone  Azithromycin     Subjective: Patient not interactive, all information from patient's family at the bedside. No nausea or vomiting, but significant decreased PO intake for the last 3 weeks, no non ambulatory.   Objective: Vitals:   11/26/16 2124 11/26/16 2318 11/27/16 0120 11/27/16 0600  BP: 120/71 132/72 140/61 (!) 126/55  Pulse: 71 76 78 73  Resp: 15 18 18 18   Temp: 99.1 F (37.3 C)  98.3 F (36.8 C) 98.5 F (36.9 C)  TempSrc: Rectal  Oral Axillary  SpO2: 97% 97% 98% 100%    Intake/Output Summary (Last 24 hours)  at 11/27/16 1137 Last data filed at 11/27/16 0600  Gross per 24 hour  Intake              660 ml  Output                0 ml  Net              660 ml   There were no vitals filed for this visit.  Examination:  General exam: Patient not communicative, deconditioned and ill looking appearing E ENT: not able to assess due to poor cooperation. Respiratory system: Clear to auscultation. Decreased inspiratory effort with decreased breath sounds at bases.    Cardiovascular system: S1 & S2 heard, RRR. No JVD, murmurs, rubs, gallops or clicks. No pedal edema. Gastrointestinal system: Abdomen is nondistended, soft and nontender. No organomegaly or masses felt. Normal bowel sounds heard. Central nervous system: Alert and oriented. No focal neurological deficits. Extremities: Symmetric 5 x 5 power. Skin: No rashes, lesions or ulcers    Data Reviewed: I have personally reviewed following labs and imaging studies  CBC:  Recent Labs Lab 11/26/16 2101 11/27/16 0420  WBC 16.0* 15.7*  HGB 9.0* 8.1*  HCT 27.4* 24.5*  MCV 87.3 87.2  PLT 188 [id number]   Basic Metabolic Panel:  Recent Labs Lab 11/26/16 2101 11/27/16 0420  NA 145 144  K 4.8 3.9  CL 115* 116*  CO2 22 20*  GLUCOSE 185* 195*  BUN 76* 73*  CREATININE 2.50* 2.32*  CALCIUM 12.4* 11.8*   GFR: CrCl cannot be calculated (Unknown ideal weight.). Liver Function Tests:  Recent Labs Lab 11/26/16 2101  AST 57*  ALT 23  ALKPHOS 150*  BILITOT 0.9  PROT 7.1  ALBUMIN 2.8*   No results for input(s): LIPASE, AMYLASE in the last 168 hours. No results for input(s): AMMONIA in the last 168 hours. Coagulation Profile: No results for input(s): INR, PROTIME in the last 168 hours. Cardiac Enzymes: No results for input(s): CKTOTAL, CKMB, CKMBINDEX, TROPONINI in the last 168 hours. BNP (last 3 results) No results for input(s): PROBNP in the last 8760 hours. HbA1C: No results for input(s): HGBA1C in the last 72 hours. CBG:  Recent Labs Lab 11/26/16 1933 11/27/16 0117 11/27/16 0558 11/27/16 0744  GLUCAP 175* 157* 160* 141*   Lipid Profile: No results for input(s): CHOL, HDL, LDLCALC, TRIG, CHOLHDL, LDLDIRECT in the last 72 hours. Thyroid Function Tests: No results for input(s): TSH, T4TOTAL, FREET4, T3FREE, THYROIDAB in the last 72 hours. Anemia Panel: No results for input(s): VITAMINB12, FOLATE, FERRITIN, TIBC, IRON, RETICCTPCT in the last 72 hours. Sepsis Labs:  Recent  Labs Lab 11/26/16 2110  LATICACIDVEN 1.52    Recent Results (from the past 240 hour(s))  MRSA PCR Screening     Status: None   Collection Time: 11/27/16  2:35 AM  Result Value Ref Range Status   MRSA by PCR NEGATIVE NEGATIVE Final    Comment:        The GeneXpert MRSA Assay (FDA approved for NASAL specimens only), is one component of a comprehensive MRSA colonization surveillance program. It is not intended to diagnose MRSA infection nor to guide or monitor treatment for MRSA infections.          Radiology Studies: Ct Abdomen Pelvis Wo Contrast  Result Date: 11/26/2016 CLINICAL DATA:  Generalize weakness and increased lethargy. Decreased appetite. Altered mental status. EXAM: CT ABDOMEN AND PELVIS WITHOUT CONTRAST TECHNIQUE: Multidetector CT imaging of the abdomen and pelvis was performed  following the standard protocol without IV contrast. COMPARISON:  05/08/2014 FINDINGS: Lower chest: Small bilateral pleural effusions with basilar atelectasis in the lungs. Coronary artery and aortic valve calcifications. There is a lobulated somewhat irregular mass in the left breast measuring 4.4 x 5.9 cm. The patient has a history of breast cancer in the left upper-outer quadrant, likely corresponding to this lesion. This may represent a primary or recurrent lesion, depending on previous treatment history. There appears to be an IV catheter site in the left antecubital fossa which is included on the ages of the film. There appears to be extravasation at the IV site with edema or hematoma in the soft tissues and soft tissue gas. Vascular calcifications are demonstrated in the brachial artery. Hepatobiliary: Heterogeneous appearance of the liver with multiple low-attenuation lesions throughout. These are new since previous study and likely represent metastatic disease. Largest lesion is demonstrated in the lateral segment left lobe of the liver, measuring about 5.8 x 8.2 cm. Lesions are not well  characterized on noncontrast imaging. Surgical absence of the gallbladder. No bile duct dilatation. Pancreas: Noncontrast appearance is unremarkable. Spleen: Normal in size without focal abnormality. Adrenals/Urinary Tract: Adrenal glands are unremarkable. Kidneys are normal, without renal calculi, focal lesion, or hydronephrosis. Bladder is unremarkable. Stomach/Bowel: Stomach and small bowel are decompressed. Scattered stool throughout the colon without abnormal distention. Colonic diverticulosis. No evidence of diverticulitis. Appendix is not identified. Vascular/Lymphatic: Aortic atherosclerosis. No enlarged abdominal or pelvic lymph nodes. Reproductive: Calcified fibroids in the uterus. No abnormal adnexal masses. Other: No free air or free fluid in the abdomen. Abdominal wall musculature appears intact. Musculoskeletal: Destructive and expansile bone lesions are demonstrated in bilateral ribs, throughout the pelvis, and with a large expansile destructive lesion in the right inferior pubic ramus. This corresponds to the abnormality demonstrated on recent plain film. Changes are consistent with diffuse bone metastasis. Diffuse degenerative changes throughout the spine with small scattered lucencies in vertebra. IMPRESSION: 1. Large lobulated mass in the left breast consistent with breast carcinoma. 2. Multiple liver metastases, new since prior study. 3. Multiple bone metastases, including a large destructive lesion in the right inferior pubic ramus, accounting for plain film finding. 4. IV site in the left upper arm has infiltrated with large hematoma/edema and subcutaneous emphysema. These results were called by telephone at the time of interpretation on 11/26/2016 at 11:19 pm to Dr. Leo Grosser , who verbally acknowledged these results. Electronically Signed   By: Lucienne Capers M.D.   On: 11/26/2016 23:22   Dg Pelvis 1-2 Views  Result Date: 11/26/2016 CLINICAL DATA:  Acute onset of decreased appetite  and lethargy. Initial encounter. EXAM: PELVIS - 1-2 VIEW COMPARISON:  CT of the abdomen and pelvis from 05/08/2014 FINDINGS: There is no evidence of fracture or dislocation. There appears to be new absence of much of the right inferior pubic ramus, raising concern for underlying large lytic mass. Both femoral heads are seated normally within their respective acetabula. Mild degenerative change is noted at the lower lumbar spine. The sacroiliac joints are unremarkable in appearance. The visualized bowel gas pattern is grossly unremarkable in appearance. Scattered vascular calcifications are seen. IMPRESSION: No evidence of fracture or dislocation. Apparent new absence of much of the right inferior pubic ramus, raising concern for new underlying large lytic mass. CT or MRI of the pelvis would be helpful for further evaluation, when and as deemed clinically appropriate. Electronically Signed   By: Garald Balding M.D.   On: 11/26/2016 22:49  Ct Head Wo Contrast  Result Date: 11/26/2016 CLINICAL DATA:  Acute onset of generalized weakness and lethargy. Decreased appetite. Assess for brain metastases. Initial encounter. EXAM: CT HEAD WITHOUT CONTRAST TECHNIQUE: Contiguous axial images were obtained from the base of the skull through the vertex without intravenous contrast. COMPARISON:  CT of the head performed 06/13/2008 FINDINGS: Brain: No evidence of acute infarction, hemorrhage, hydrocephalus, extra-axial collection or mass lesion/mass effect. Prominence of the ventricles and sulci reflects mild to moderate cortical volume loss. Mild chronic ischemic change is suggested at the right cerebellar hemisphere. Scattered periventricular and subcortical white matter change likely reflects small vessel ischemic microangiopathy. Small chronic lacunar infarcts are noted at the right basal ganglia and left thalamus. The brainstem and fourth ventricle are within normal limits. The cerebral hemispheres demonstrate grossly  normal gray-white differentiation. No mass effect or midline shift is seen. Vascular: No hyperdense vessel or unexpected calcification. Skull: There is no evidence of fracture; visualized osseous structures are unremarkable in appearance. Sinuses/Orbits: The orbits are within normal limits. The paranasal sinuses and mastoid air cells are well-aerated. Other: No significant soft tissue abnormalities are seen. IMPRESSION: 1. No acute intracranial pathology seen on CT. Evaluation for metastatic disease is limited on CT, but no abnormal edema is seen. 2. Mild to moderate cortical volume loss and scattered small vessel ischemic microangiopathy. 3. Suggestion of mild chronic ischemic change at the right cerebellar hemisphere. Small chronic lacunar infarcts at the right basal ganglia and left thalamus. Electronically Signed   By: Garald Balding M.D.   On: 11/26/2016 23:52   Dg Abdomen Acute W/chest  Result Date: 11/26/2016 CLINICAL DATA:  Abdominal distention. Patient has been lethargic and not eating. Altered mental status. EXAM: DG ABDOMEN ACUTE W/ 1V CHEST COMPARISON:  Chest 05/09/2014.  CT abdomen and pelvis 05/08/2014 FINDINGS: Shallow inspiration. Cardiac enlargement with mild pulmonary vascular congestion. Infiltration or edema in the lung bases. No blunting of costophrenic angles. No pneumothorax. Calcified and tortuous aorta. Scattered gas and stool in the colon. No small or large bowel distention. No free intra- abdominal air. Surgical clips in the right upper quadrant. Vascular calcifications. Examination of the pelvis is limited by patient rotation, but there appear to be fractures of the right superior and inferior pubic rami which were not present on the previous CT scan. Acute pelvic fractures are not excluded. IMPRESSION: Cardiac enlargement with vascular congestion and infiltration or edema in the lung bases. Normal nonobstructive bowel gas pattern. No free air. Possible acute fractures of the right  superior and inferior pubic rami. Electronically Signed   By: Lucienne Capers M.D.   On: 11/26/2016 21:59        Scheduled Meds: . atropine  1 drop Left Eye BID  . azithromycin  500 mg Intravenous QHS  . cefTRIAXone (ROCEPHIN)  IV  1 g Intravenous QHS  . dorzolamide-timolol  1 drop Both Eyes BID  . heparin  5,000 Units Subcutaneous Q8H  . insulin aspart  0-9 Units Subcutaneous Q4H  . insulin glargine  6 Units Subcutaneous QHS  . sodium chloride flush  3 mL Intravenous Q12H   Continuous Infusions:   LOS: 0 days        Elizah Mierzwa Gerome Apley, MD [facility] Pager [phone]  If 7PM-7AM, please contact night-coverage [email or web address] Password [facility] 11/27/2016, 11:37 AM

## 2016-11-28 ENCOUNTER — Inpatient Hospital Stay (HOSPITAL_COMMUNITY): Payer: Medicare Other

## 2016-11-28 DIAGNOSIS — R627 Adult failure to thrive: Secondary | ICD-10-CM

## 2016-11-28 DIAGNOSIS — F0391 Unspecified dementia with behavioral disturbance: Secondary | ICD-10-CM

## 2016-11-28 DIAGNOSIS — I5032 Chronic diastolic (congestive) heart failure: Secondary | ICD-10-CM

## 2016-11-28 DIAGNOSIS — G9341 Metabolic encephalopathy: Secondary | ICD-10-CM

## 2016-11-28 DIAGNOSIS — N179 Acute kidney failure, unspecified: Secondary | ICD-10-CM

## 2016-11-28 DIAGNOSIS — C50919 Malignant neoplasm of unspecified site of unspecified female breast: Secondary | ICD-10-CM

## 2016-11-28 DIAGNOSIS — D509 Iron deficiency anemia, unspecified: Secondary | ICD-10-CM

## 2016-11-28 LAB — BASIC METABOLIC PANEL
Anion gap: 3 — ABNORMAL LOW (ref 5–15)
BUN: 53 mg/dL — AB (ref 6–20)
CALCIUM: 11.4 mg/dL — AB (ref 8.9–10.3)
CHLORIDE: 123 mmol/L — AB (ref 101–111)
CO2: 22 mmol/L (ref 22–32)
CREATININE: 1.76 mg/dL — AB (ref 0.44–1.00)
GFR, EST AFRICAN AMERICAN: 29 mL/min — AB (ref >60)
GFR, EST NON AFRICAN AMERICAN: 25 mL/min — AB (ref >60)
Glucose, Bld: 170 mg/dL — ABNORMAL HIGH (ref 65–99)
Potassium: 3.5 mmol/L (ref 3.5–5.1)
SODIUM: 148 mmol/L — AB (ref 135–145)

## 2016-11-28 LAB — CBC WITH DIFFERENTIAL/PLATELET
BASOS PCT: 0 %
Basophils Absolute: 0 10*3/uL (ref 0.0–0.1)
EOS ABS: 0 10*3/uL (ref 0.0–0.7)
EOS PCT: 0 %
HCT: 23.3 % — ABNORMAL LOW (ref 36.0–46.0)
HEMOGLOBIN: 7.7 g/dL — AB (ref 12.0–15.0)
Lymphocytes Relative: 5 %
Lymphs Abs: 0.6 10*3/uL — ABNORMAL LOW (ref 0.7–4.0)
MCH: 28.8 pg (ref 26.0–34.0)
MCHC: 33 g/dL (ref 30.0–36.0)
MCV: 87.3 fL (ref 78.0–100.0)
Monocytes Absolute: 0.8 10*3/uL (ref 0.1–1.0)
Monocytes Relative: 6 %
NEUTROS PCT: 89 %
Neutro Abs: 10.4 10*3/uL — ABNORMAL HIGH (ref 1.7–7.7)
PLATELETS: 175 10*3/uL (ref 150–400)
RBC: 2.67 MIL/uL — ABNORMAL LOW (ref 3.87–5.11)
RDW: 16.7 % — AB (ref 11.5–15.5)
WBC: 11.9 10*3/uL — ABNORMAL HIGH (ref 4.0–10.5)

## 2016-11-28 LAB — GLUCOSE, CAPILLARY
GLUCOSE-CAPILLARY: 149 mg/dL — AB (ref 65–99)
GLUCOSE-CAPILLARY: 159 mg/dL — AB (ref 65–99)
GLUCOSE-CAPILLARY: 168 mg/dL — AB (ref 65–99)
GLUCOSE-CAPILLARY: 181 mg/dL — AB (ref 65–99)
GLUCOSE-CAPILLARY: 195 mg/dL — AB (ref 65–99)
Glucose-Capillary: 118 mg/dL — ABNORMAL HIGH (ref 65–99)

## 2016-11-28 LAB — PREPARE RBC (CROSSMATCH)

## 2016-11-28 LAB — ABO/RH: ABO/RH(D): B POS

## 2016-11-28 LAB — PROCALCITONIN: PROCALCITONIN: 0.34 ng/mL

## 2016-11-28 MED ORDER — INSULIN ASPART 100 UNIT/ML ~~LOC~~ SOLN
0.0000 [IU] | Freq: Three times a day (TID) | SUBCUTANEOUS | Status: DC
  Administered 2016-11-29 – 2016-11-30 (×5): 2 [IU] via SUBCUTANEOUS
  Administered 2016-11-30: 1 [IU] via SUBCUTANEOUS
  Administered 2016-12-01 (×2): 2 [IU] via SUBCUTANEOUS
  Administered 2016-12-01: 1 [IU] via SUBCUTANEOUS
  Administered 2016-12-02: 2 [IU] via SUBCUTANEOUS

## 2016-11-28 MED ORDER — AMLODIPINE BESYLATE 5 MG PO TABS
5.0000 mg | ORAL_TABLET | Freq: Every day | ORAL | Status: DC
  Administered 2016-11-28 – 2016-12-01 (×4): 5 mg via ORAL
  Filled 2016-11-28 (×5): qty 1

## 2016-11-28 MED ORDER — METOPROLOL TARTRATE 25 MG PO TABS
25.0000 mg | ORAL_TABLET | Freq: Two times a day (BID) | ORAL | Status: DC
  Administered 2016-11-28 – 2016-12-02 (×7): 25 mg via ORAL
  Filled 2016-11-28 (×7): qty 1

## 2016-11-28 MED ORDER — SODIUM CHLORIDE 0.9 % IV SOLN: Freq: Once | INTRAVENOUS | Status: DC

## 2016-11-28 MED ORDER — FERROUS SULFATE 325 (65 FE) MG PO TABS
325.0000 mg | ORAL_TABLET | Freq: Three times a day (TID) | ORAL | Status: DC
  Administered 2016-11-28 – 2016-12-01 (×7): 325 mg via ORAL
  Filled 2016-11-28 (×7): qty 1

## 2016-11-28 NOTE — Progress Notes (Signed)
Initial Nutrition Assessment  DOCUMENTATION CODES:   Not applicable (unable to determine)  INTERVENTION:   Provide Magic cup TID with meals, each supplement provides 290 kcal and 9 grams of protein Provide Mighty Shake II TID with meals, each supplement provides 480-500 kcals and 20-23 grams of protein RD will continue to monitor for GOC  NUTRITION DIAGNOSIS:   Inadequate oral intake related to lethargy/confusion as evidenced by meal completion < 50%.  GOAL:   Patient will meet greater than or equal to 90% of their needs  MONITOR:   PO intake, Labs, Weight trends, I & O's  REASON FOR ASSESSMENT:   Low Braden    ASSESSMENT:   83 yo female with advanced dementia and metastatic breast cancer, presents to the hospital with decreased responsiveness and lethargy. Her symptoms been ongoing for about 3 weeks with significant decrease in her by mouth intake, now patient is nonambulatory. Patient was found to have a possible pneumonia, started on antibiotics and IV fluids, palliative care has been consulted.  Patient in room sleeping with no family at bedside. Lunch tray was sitting in room untouched. Pt with advanced dementia and unable to provide history. Per chart review, pt's family reported pt has had poor PO intake and appetite for the past 3 weeks d/t lethargy.  SLP evaluation findings noted: moderate aspiration risk, recommend dysphagia 1 diet with nectar thick liquids.  PO intake records show 25% meal completion. Will order Magic Cups and Mightyshakes with meal trays to maximize kcal and protein. Palliative care meeting scheduled for tomorrow, will monitor for any GOC decisions. Nutrition focused physical exam shows no sign of depletion of muscle mass or body fat.  Medications reviewed. Labs reviewed: CBGs: 118-159 Elevated Na GFR: 29  Diet Order:  DIET - DYS 1 Room service appropriate? No; Fluid consistency: Nectar Thick  Skin:  Wound (see comment) (Stage II sacral and  perineum pressure injuries)  Last BM:  2/13  Height:   Ht Readings from Last 1 Encounters:  02/26/16 5\' 3"  (1.6 m)    Weight:   Wt Readings from Last 1 Encounters:  06/21/16 178 lb (80.7 kg)    Ideal Body Weight:  52.3 kg (based on height from May 2017)  BMI: 31 kg/m^2  Estimated Nutritional Needs:   Kcal:  1500-1700 -based on weight from September 2017  Protein:  60-70g  Fluid:  1.7L/day  EDUCATION NEEDS:   No education needs identified at this time  Clayton Bibles, MS, RD, LDN Pager: [phone] After Hours Pager: [phone]

## 2016-11-28 NOTE — Progress Notes (Addendum)
PROGRESS NOTE    [name]  [id number] DOB: July 13, 1928 DOA: 11/26/2016 PCP: Cathlean Cower, MD    Brief Narrative:  This is a 83 yo female with advanced dementia and metastatic breast cancer, presents to the hospital with decreased responsiveness and lethargy. Her symptoms been ongoing for about 3 weeks with significant decrease in her by mouth intake, now patient is nonambulatory. Patient was found to have a possible pneumonia, started on antibiotics and IV fluids, palliative care has been consulted  Assessment & Plan:   Principal Problem:   Failure to thrive in adult Active Problems:   Insulin dependent diabetes mellitus ([place])   Normocytic anemia   Depression   Essential hypertension   Chronic kidney disease, stage IV (severe) (HCC)   Chronic diastolic CHF (congestive heart failure) (HCC)   Breast cancer of upper-outer quadrant of left female breast (HCC)   Dementia, in, senility   Acid reflux   AKI (acute kidney injury) ([place])   Lytic bone lesions on xray   Liver metastases (HCC)   Leukocytosis   Metastatic breast cancer (HCC)   Pressure injury of skin    1. Metabolic encephalopathy. Patient more awake and alert, able to respond to simple questions and follow commands, will continue supportive care with iv fluids and iv antibiotics. Will follow on eeg. Will request physical therapy evaluation, patient may need SNF.   2. Possible community acquired pneumonia. Follow chest film with persistent bibasilar infiltrates, personally reviewed, will continue oxymetry monitoring and supplemental 02 per [place]. Will continue antibiotic therapy for now and will check procalcitonin, if this is negative will stop antibiotic therapy.   3. Breast cancer with mets. Poor prognosis, follow with oncology recommendations as outpatient, follow on palliative care consultation.   4. AKI on ckd stage IV. Continue to trend down cr, today at 1,76 from 2,32. K at 3,5 and serum bicarb at 22. Will  continue supportive IV fluids, will transfuse one unit of PRBC.   5. Chronic diastolic heart failure. Tolerating well IV fluids, will transfuse one unit PRBC. On as needed hydrazine for blood pressure control. Blood pressure systolic [id number] to [id number]. Will resume metoprolol 25 mg po bid and 5 mg of amlodipine.   6, T2DM. Continue glucose cover and monitoring with insulin sliding scale and 6 units of levimir at night. Capillary glucose 159, 118, 181. Improve po intake.   7. HTN. Will continue hydralazine as needed for blood pressure control, will resume metoprolol and amlodipine. Blood pressure systolic [id number] to [id number].   8. Anemia multifactorial plus iron deficiency.  Hb down to 7,7. Will transfuse one unit prbc and will start patient on iron supplements. Follow on cell count in am.   9. Multiple pressure ulcers, (present on admission). Stage 2 to 3 ulcers, ranging between 1 and 2 cm.    DVT prophylaxis: heparin  Code Status: dnr Family Communication: I spoke with patient's family at the bedside and all questions were addressed.   Disposition Plan: snf   Consultants:   Palliative care   Procedures:     Antimicrobials:   Ceftriaxone  Azithromycin  Subjective: Patient more awake and reactive, tolerating po with assistance. No nausea or vomiting. All information from nursing.   Objective: Vitals:   11/27/16 0600 11/27/16 1545 11/27/16 2243 11/28/16 0427  BP: (!) 126/55 134/73 (!) 150/57 (!) 134/56  Pulse: 73 92 78 77  Resp: 18 18 18 16   Temp: 98.5 F (36.9 C) 97.6 F (36.4 C) 98.6 F (37 C)  99.4 F (37.4 C)  TempSrc: Axillary Axillary Axillary Axillary  SpO2: 100% 96% 97% 97%    Intake/Output Summary (Last 24 hours) at 11/28/16 1211 Last data filed at 11/28/16 1034  Gross per 24 hour  Intake             2269 ml  Output             1400 ml  Net              869 ml   There were no vitals filed for this visit.  Examination:  General exam: deconditioned  E ENT:  positive pallor, no icterus, oral mucosa moist.  Respiratory system: Clear to auscultation. Mild decreased breath sounds at bases, due to poor inspiratory effort.  Cardiovascular system: S1 & S2 heard, RRR. No JVD, murmurs, rubs, gallops or clicks. No pedal edema. Gastrointestinal system: Abdomen is nondistended, soft and nontender. No organomegaly or masses felt. Normal bowel sounds heard. Central nervous system: patient responds to simple questions and follows simple commands. Extremities: Symmetric 5 x 5 power. Skin: No rashes, lesions or ulcers.      Data Reviewed: I have personally reviewed following labs and imaging studies  CBC:  Recent Labs Lab 11/26/16 2101 11/27/16 0420 11/28/16 0416  WBC 16.0* 15.7* 11.9*  NEUTROABS  --   --  10.4*  HGB 9.0* 8.1* 7.7*  HCT 27.4* 24.5* 23.3*  MCV 87.3 87.2 87.3  PLT 188 198 0000000   Basic Metabolic Panel:  Recent Labs Lab 11/26/16 2101 11/27/16 0420 11/28/16 0416  NA 145 144 148*  K 4.8 3.9 3.5  CL 115* 116* 123*  CO2 22 20* 22  GLUCOSE 185* 195* 170*  BUN 76* 73* 53*  CREATININE 2.50* 2.32* 1.76*  CALCIUM 12.4* 11.8* 11.4*   GFR: CrCl cannot be calculated (Unknown ideal weight.). Liver Function Tests:  Recent Labs Lab 11/26/16 2101  AST 57*  ALT 23  ALKPHOS 150*  BILITOT 0.9  PROT 7.1  ALBUMIN 2.8*   No results for input(s): LIPASE, AMYLASE in the last 168 hours. No results for input(s): AMMONIA in the last 168 hours. Coagulation Profile: No results for input(s): INR, PROTIME in the last 168 hours. Cardiac Enzymes: No results for input(s): CKTOTAL, CKMB, CKMBINDEX, TROPONINI in the last 168 hours. BNP (last 3 results) No results for input(s): PROBNP in the last 8760 hours. HbA1C: No results for input(s): HGBA1C in the last 72 hours. CBG:  Recent Labs Lab 11/27/16 1758 11/27/16 2004 11/28/16 0005 11/28/16 0408 11/28/16 0749  GLUCAP 135* 162* 195* 159* 118*   Lipid Profile: No results for input(s):  CHOL, HDL, LDLCALC, TRIG, CHOLHDL, LDLDIRECT in the last 72 hours. Thyroid Function Tests: No results for input(s): TSH, T4TOTAL, FREET4, T3FREE, THYROIDAB in the last 72 hours. Anemia Panel: No results for input(s): VITAMINB12, FOLATE, FERRITIN, TIBC, IRON, RETICCTPCT in the last 72 hours. Sepsis Labs:  Recent Labs Lab 11/26/16 2110  LATICACIDVEN 1.52    Recent Results (from the past 240 hour(s))  Blood culture (routine x 2)     Status: None (Preliminary result)   Collection Time: 11/26/16  9:37 PM  Result Value Ref Range Status   Specimen Description BLOOD LEFT ARM  Final   Special Requests BOTTLES DRAWN AEROBIC AND ANAEROBIC 5CC  Final   Culture   Final    NO GROWTH < 24 HOURS Performed at [facility], [place] [address], [place], [place] [address]    Report Status PENDING  Incomplete  Urine culture     Status: Abnormal (Preliminary result)   Collection Time: 11/26/16 10:05 PM  Result Value Ref Range Status   Specimen Description URINE, CATHETERIZED  Final   Special Requests NONE  Final   Culture (A)  Final    >=100,000 COLONIES/mL ENTEROCOCCUS FAECALIS SUSCEPTIBILITIES TO FOLLOW Performed at [facility], [place] [address] [place], [place] [address]    Report Status PENDING  Incomplete  MRSA PCR Screening     Status: None   Collection Time: 11/27/16  2:35 AM  Result Value Ref Range Status   MRSA by PCR NEGATIVE NEGATIVE Final    Comment:        The GeneXpert MRSA Assay (FDA approved for NASAL specimens only), is one component of a comprehensive MRSA colonization surveillance program. It is not intended to diagnose MRSA infection nor to guide or monitor treatment for MRSA infections.          Radiology Studies: Ct Abdomen Pelvis Wo Contrast  Result Date: 11/26/2016 CLINICAL DATA:  Generalize weakness and increased lethargy. Decreased appetite. Altered mental status. EXAM: CT ABDOMEN AND PELVIS WITHOUT CONTRAST TECHNIQUE: Multidetector CT imaging of  the abdomen and pelvis was performed following the standard protocol without IV contrast. COMPARISON:  05/08/2014 FINDINGS: Lower chest: Small bilateral pleural effusions with basilar atelectasis in the lungs. Coronary artery and aortic valve calcifications. There is a lobulated somewhat irregular mass in the left breast measuring 4.4 x 5.9 cm. The patient has a history of breast cancer in the left upper-outer quadrant, likely corresponding to this lesion. This may represent a primary or recurrent lesion, depending on previous treatment history. There appears to be an IV catheter site in the left antecubital fossa which is included on the ages of the film. There appears to be extravasation at the IV site with edema or hematoma in the soft tissues and soft tissue gas. Vascular calcifications are demonstrated in the brachial artery. Hepatobiliary: Heterogeneous appearance of the liver with multiple low-attenuation lesions throughout. These are new since previous study and likely represent metastatic disease. Largest lesion is demonstrated in the lateral segment left lobe of the liver, measuring about 5.8 x 8.2 cm. Lesions are not well characterized on noncontrast imaging. Surgical absence of the gallbladder. No bile duct dilatation. Pancreas: Noncontrast appearance is unremarkable. Spleen: Normal in size without focal abnormality. Adrenals/Urinary Tract: Adrenal glands are unremarkable. Kidneys are normal, without renal calculi, focal lesion, or hydronephrosis. Bladder is unremarkable. Stomach/Bowel: Stomach and small bowel are decompressed. Scattered stool throughout the colon without abnormal distention. Colonic diverticulosis. No evidence of diverticulitis. Appendix is not identified. Vascular/Lymphatic: Aortic atherosclerosis. No enlarged abdominal or pelvic lymph nodes. Reproductive: Calcified fibroids in the uterus. No abnormal adnexal masses. Other: No free air or free fluid in the abdomen. Abdominal wall  musculature appears intact. Musculoskeletal: Destructive and expansile bone lesions are demonstrated in bilateral ribs, throughout the pelvis, and with a large expansile destructive lesion in the right inferior pubic ramus. This corresponds to the abnormality demonstrated on recent plain film. Changes are consistent with diffuse bone metastasis. Diffuse degenerative changes throughout the spine with small scattered lucencies in vertebra. IMPRESSION: 1. Large lobulated mass in the left breast consistent with breast carcinoma. 2. Multiple liver metastases, new since prior study. 3. Multiple bone metastases, including a large destructive lesion in the right inferior pubic ramus, accounting for plain film finding. 4. IV site in the left upper arm has infiltrated with large hematoma/edema and subcutaneous emphysema. These  results were called by telephone at the time of interpretation on 11/26/2016 at 11:19 pm to Dr. Leo Grosser , who verbally acknowledged these results. Electronically Signed   By: Lucienne Capers M.D.   On: 11/26/2016 23:22   Dg Chest 2 View  Result Date: 11/28/2016 CLINICAL DATA:  Follow-up bilateral infiltrates EXAM: CHEST  2 VIEW COMPARISON:  11/26/2016 FINDINGS: Cardiomegaly with vascular congestion. Bibasilar airspace opacities are again noted, right greater than left, unchanged. Small bilateral effusions. No acute bony abnormality. IMPRESSION: Bibasilar airspace opacities and small effusions. Mild cardiomegaly, vascular congestion. No significant change. Electronically Signed   By: Rolm Baptise M.D.   On: 11/28/2016 09:02   Dg Pelvis 1-2 Views  Result Date: 11/26/2016 CLINICAL DATA:  Acute onset of decreased appetite and lethargy. Initial encounter. EXAM: PELVIS - 1-2 VIEW COMPARISON:  CT of the abdomen and pelvis from 05/08/2014 FINDINGS: There is no evidence of fracture or dislocation. There appears to be new absence of much of the right inferior pubic ramus, raising concern for  underlying large lytic mass. Both femoral heads are seated normally within their respective acetabula. Mild degenerative change is noted at the lower lumbar spine. The sacroiliac joints are unremarkable in appearance. The visualized bowel gas pattern is grossly unremarkable in appearance. Scattered vascular calcifications are seen. IMPRESSION: No evidence of fracture or dislocation. Apparent new absence of much of the right inferior pubic ramus, raising concern for new underlying large lytic mass. CT or MRI of the pelvis would be helpful for further evaluation, when and as deemed clinically appropriate. Electronically Signed   By: Garald Balding M.D.   On: 11/26/2016 22:49   Ct Head Wo Contrast  Result Date: 11/26/2016 CLINICAL DATA:  Acute onset of generalized weakness and lethargy. Decreased appetite. Assess for brain metastases. Initial encounter. EXAM: CT HEAD WITHOUT CONTRAST TECHNIQUE: Contiguous axial images were obtained from the base of the skull through the vertex without intravenous contrast. COMPARISON:  CT of the head performed 06/13/2008 FINDINGS: Brain: No evidence of acute infarction, hemorrhage, hydrocephalus, extra-axial collection or mass lesion/mass effect. Prominence of the ventricles and sulci reflects mild to moderate cortical volume loss. Mild chronic ischemic change is suggested at the right cerebellar hemisphere. Scattered periventricular and subcortical white matter change likely reflects small vessel ischemic microangiopathy. Small chronic lacunar infarcts are noted at the right basal ganglia and left thalamus. The brainstem and fourth ventricle are within normal limits. The cerebral hemispheres demonstrate grossly normal gray-white differentiation. No mass effect or midline shift is seen. Vascular: No hyperdense vessel or unexpected calcification. Skull: There is no evidence of fracture; visualized osseous structures are unremarkable in appearance. Sinuses/Orbits: The orbits are  within normal limits. The paranasal sinuses and mastoid air cells are well-aerated. Other: No significant soft tissue abnormalities are seen. IMPRESSION: 1. No acute intracranial pathology seen on CT. Evaluation for metastatic disease is limited on CT, but no abnormal edema is seen. 2. Mild to moderate cortical volume loss and scattered small vessel ischemic microangiopathy. 3. Suggestion of mild chronic ischemic change at the right cerebellar hemisphere. Small chronic lacunar infarcts at the right basal ganglia and left thalamus. Electronically Signed   By: Garald Balding M.D.   On: 11/26/2016 23:52   Dg Abdomen Acute W/chest  Result Date: 11/26/2016 CLINICAL DATA:  Abdominal distention. Patient has been lethargic and not eating. Altered mental status. EXAM: DG ABDOMEN ACUTE W/ 1V CHEST COMPARISON:  Chest 05/09/2014.  CT abdomen and pelvis 05/08/2014 FINDINGS: Shallow inspiration. Cardiac enlargement with  mild pulmonary vascular congestion. Infiltration or edema in the lung bases. No blunting of costophrenic angles. No pneumothorax. Calcified and tortuous aorta. Scattered gas and stool in the colon. No small or large bowel distention. No free intra- abdominal air. Surgical clips in the right upper quadrant. Vascular calcifications. Examination of the pelvis is limited by patient rotation, but there appear to be fractures of the right superior and inferior pubic rami which were not present on the previous CT scan. Acute pelvic fractures are not excluded. IMPRESSION: Cardiac enlargement with vascular congestion and infiltration or edema in the lung bases. Normal nonobstructive bowel gas pattern. No free air. Possible acute fractures of the right superior and inferior pubic rami. Electronically Signed   By: Lucienne Capers M.D.   On: 11/26/2016 21:59        Scheduled Meds: . sodium chloride   Intravenous Once  . atropine  1 drop Left Eye BID  . azithromycin  500 mg Intravenous QHS  . cefTRIAXone  (ROCEPHIN)  IV  1 g Intravenous QHS  . dorzolamide-timolol  1 drop Both Eyes BID  . heparin  5,000 Units Subcutaneous Q8H  . insulin aspart  0-9 Units Subcutaneous Q4H  . insulin glargine  6 Units Subcutaneous QHS  . sodium chloride flush  3 mL Intravenous Q12H   Continuous Infusions: . sodium chloride 1,000 mL (11/28/16 0430)     LOS: 1 day     Alta Shober Gerome Apley, MD [facility] Pager [phone]  If 7PM-7AM, please contact night-coverage [email or web address] Password [facility] 11/28/2016, 12:11 PM

## 2016-11-28 NOTE — Progress Notes (Signed)
Speech Language Pathology Treatment: Dysphagia  Patient Details Name: [name] MRN: [id number] DOB: 05-Jul-1928 Today's Date: 11/28/2016 Time: 1210-1230 SLP Time Calculation (min) (ACUTE ONLY): 20 min  Assessment / Plan / Recommendation Clinical Impression  F/u after yesterday's clinical swallow eval.  Assisted pt with lunch.  Declined to eat most of items on tray despite encouragement, but consumed chocolate pudding.  Swallow is characterized by oral delays/holding with cues needed to swallow.  Pt continuously asking for water.  When assisted with drinking (hand over hand with pt controlling cup to lips), and provided only small sips, toleration appeared adequate.  When drinking thins during meal, pt with intermittent coughing, oral holding, with concerns for aspiration of water.  Nectars appeared to be better tolerated.  For today, recommend continuing nectar-thick liquids with meals, but allow sips of water between meals.  Will f/u next date to determine if family would prefer to allow pt to be able to eat/drink per her preferences. Palliative care is following.    HPI HPI: 83 year old female admitted 11/26/16 due to lethargy and decreased responsiveness, significant decline over the past several weeks. PMH significant for dementia, CHF, GERD, IDDM, CAD, HTN, breast cancer with liver/bone mets. CXR reveals cardiomegaly, pulmonary vascular congestion, BLL infiltrate vx edema.      SLP Plan  Continue with current plan of care     Recommendations  Diet recommendations: Dysphagia 1 (puree);Nectar-thick liquid (allow sips of water between meals and after oral care) Liquids provided via: Cup;Straw Medication Administration: Crushed with puree Supervision: Full supervision/cueing for compensatory strategies Compensations: Minimize environmental distractions;Slow rate;Small sips/bites Postural Changes and/or Swallow Maneuvers: Seated upright 90 degrees                Oral Care  Recommendations: Oral care prior to ice chip/H20 Follow up Recommendations: 24 hour supervision/assistance Plan: Continue with current plan of care       GO                Juan Quam Laurice 11/28/2016, 12:34 PM

## 2016-11-28 NOTE — Progress Notes (Signed)
EEG order cancelled. Pt became agitated during application of electrodes.  Nurse at bedside, calming pt. Also pt receiving blood. MD notified, cancelled EEG.

## 2016-11-29 DIAGNOSIS — R918 Other nonspecific abnormal finding of lung field: Secondary | ICD-10-CM

## 2016-11-29 DIAGNOSIS — D72829 Elevated white blood cell count, unspecified: Secondary | ICD-10-CM

## 2016-11-29 DIAGNOSIS — N184 Chronic kidney disease, stage 4 (severe): Secondary | ICD-10-CM

## 2016-11-29 LAB — GLUCOSE, CAPILLARY
GLUCOSE-CAPILLARY: 168 mg/dL — AB (ref 65–99)
GLUCOSE-CAPILLARY: 151 mg/dL — AB (ref 65–99)
Glucose-Capillary: 160 mg/dL — ABNORMAL HIGH (ref 65–99)
Glucose-Capillary: 146 mg/dL — ABNORMAL HIGH (ref 65–99)

## 2016-11-29 LAB — CBC WITH DIFFERENTIAL/PLATELET
Basophils Absolute: 0 10*3/uL (ref 0.0–0.1)
Basophils Relative: 0 %
EOS ABS: 0.1 10*3/uL (ref 0.0–0.7)
Eosinophils Relative: 0 %
HEMATOCRIT: 29.6 % — AB (ref 36.0–46.0)
HEMOGLOBIN: 9.8 g/dL — AB (ref 12.0–15.0)
LYMPHS ABS: 0.9 10*3/uL (ref 0.7–4.0)
Lymphocytes Relative: 7 %
MCH: 28.4 pg (ref 26.0–34.0)
MCHC: 33.1 g/dL (ref 30.0–36.0)
MCV: 85.8 fL (ref 78.0–100.0)
MONOS PCT: 8 %
Monocytes Absolute: 1 10*3/uL (ref 0.1–1.0)
NEUTROS PCT: 84 %
Neutro Abs: 10.5 10*3/uL — ABNORMAL HIGH (ref 1.7–7.7)
Platelets: 178 10*3/uL (ref 150–400)
RBC: 3.45 MIL/uL — ABNORMAL LOW (ref 3.87–5.11)
RDW: 16.4 % — ABNORMAL HIGH (ref 11.5–15.5)
WBC: 12.4 10*3/uL — ABNORMAL HIGH (ref 4.0–10.5)

## 2016-11-29 LAB — BASIC METABOLIC PANEL
Anion gap: 6 (ref 5–15)
BUN: 37 mg/dL — AB (ref 6–20)
CHLORIDE: 118 mmol/L — AB (ref 101–111)
CO2: 23 mmol/L (ref 22–32)
CREATININE: 1.47 mg/dL — AB (ref 0.44–1.00)
Calcium: 11.3 mg/dL — ABNORMAL HIGH (ref 8.9–10.3)
GFR calc Af Amer: 36 mL/min — ABNORMAL LOW (ref >60)
GFR calc non Af Amer: 31 mL/min — ABNORMAL LOW (ref >60)
Glucose, Bld: 185 mg/dL — ABNORMAL HIGH (ref 65–99)
Potassium: 3.4 mmol/L — ABNORMAL LOW (ref 3.5–5.1)
SODIUM: 147 mmol/L — AB (ref 135–145)

## 2016-11-29 LAB — TYPE AND SCREEN
BLOOD PRODUCT EXPIRATION DATE: 201803142359
ISSUE DATE / TIME: 201802151516
UNIT TYPE AND RH: 7300

## 2016-11-29 MED ORDER — SODIUM CHLORIDE 0.45 % IV SOLN
INTRAVENOUS | Status: DC
Start: 2016-11-29 — End: 2016-11-30
  Administered 2016-11-29: 14:00:00 via INTRAVENOUS

## 2016-11-29 MED ORDER — AZITHROMYCIN 250 MG PO TABS
500.0000 mg | ORAL_TABLET | Freq: Every day | ORAL | Status: DC
  Administered 2016-11-30 – 2016-12-01 (×2): 500 mg via ORAL
  Filled 2016-11-29 (×3): qty 2

## 2016-11-29 NOTE — Progress Notes (Signed)
PROGRESS NOTE    [name]  [id number] DOB: 04/20/1928 DOA: 11/26/2016 PCP: Cathlean Cower, MD    Brief Narrative:  This is a 83 yo female with advanced dementia and metastatic breast cancer, presents to the hospital with decreased responsiveness and lethargy. Her symptoms been ongoing for about 3 weeks with significant decrease in her by mouth intake, now patient is nonambulatory. Patient was found to have a possible pneumonia, started on antibiotics and IV fluids, palliative care has been consulted. Encephalopathy has been improving with supportive care, waiting physical therapy for discharge planing.   Assessment & Plan:   Principal Problem:   Failure to thrive in adult Active Problems:   Insulin dependent diabetes mellitus (HCC)   Normocytic anemia   Depression   Essential hypertension   Chronic kidney disease, stage IV (severe) (HCC)   Chronic diastolic CHF (congestive heart failure) (HCC)   Breast cancer of upper-outer quadrant of left female breast (HCC)   Dementia, in, senility   Acid reflux   AKI (acute kidney injury) ([place])   Lytic bone lesions on xray   Liver metastases (HCC)   Leukocytosis   Metastatic breast cancer (HCC)   Pressure injury of skin  1. Metabolic encephalopathy. Patient continue to improve in level of awareness , able to respond to simple questions and follow commands. Will continue antibiotic therapy and will reduce rate of iV fluids to prevent volume overload.  2. Community acquired pneumonia. Positive procalcitonin and persistent infiltrates on chest film, will continue therapy with antibiotic therapy ceftriaxone and azithromycin to complete 8 days. Will change macrolide to po formulation. Continue oxymetry monitoring and supplemental 02 per [place].   3. Breast cancer with mets. Case discussed with palliative team, Dr. Domingo Cocking. Patient not candidate for hospice facility at this point. Will continue supportive care.   4. AKI on ckd stage IV.  Continue to trend down cr, today at 1,47 from 1,7. K at 3,4 and serum bicarb at 23. Patient tolerated well blood transfusion, continue IV fluids with hypotonic solutions at a lower rate.   5. Chronic diastolic heart failure.  No signs of volume overload, will gentle hydration with half normal saline, tolerated well blood transfusion, one unit. Continue metoprolol and blood pressure control with amlodipine, systolic blood pressure [id number] to 160 mmHg.   6, T2DM. Glucose cover and monitoring with insulin sliding scale and 6 units of levimir at night. Capillary glucose 168, 160, 146. Tolerating po.   7. HTN. Continue amlodipine, metoprolol and as needed hydralazine. Blood pressure systolic 0000000.  8. Anemia multifactorial plus iron deficiency. Hb up to 9,8 with hct 29,6. Will continue to follow cell count, continue po iron.    9. Multiple pressure ulcers, (present on admission). Stage 2 to 3 ulcers, ranging between 1 and 2 cm. Continue local wound care.   DVT prophylaxis:heparin  Code Status:dnr Family Communication: I spoke with patient's family at the bedside and all questions were addressed.  Disposition Plan:snf   Consultants:  Palliative care   Procedures:    Antimicrobials:   Ceftriaxone  Azithromycin    Subjective: Patient more awake and alert, able to follow commands and respond to simple questions, tolerating po well, no nausea or vomiting, no dyspnea or cough.   Objective: Vitals:   11/28/16 1544 11/28/16 1748 11/28/16 2119 11/29/16 0532  BP: (!) 161/73 139/75 (!) 150/71 (!) 163/85  Pulse: 74 77 76 62  Resp: 18 18 17 17   Temp: 98.8 F (37.1 C) 98.3 F (36.8 C)  97.9 F (36.6 C) 98.2 F (36.8 C)  TempSrc: Axillary Axillary Oral Oral  SpO2: 100% 100% 96% 99%    Intake/Output Summary (Last 24 hours) at 11/29/16 1251 Last data filed at 11/29/16 0902  Gross per 24 hour  Intake              868 ml  Output                0 ml  Net              868 ml     There were no vitals filed for this visit.  Examination:  General exam: deconditioned E ENT: oral mucosa moist, mild pallor.  Respiratory system: Decreased inspiratory effort.  Rales at bases bilaterally, no wheezing or rhonchi.  Cardiovascular system: S1 & S2 heard, RRR. No JVD, murmurs, rubs, gallops or clicks. No pedal edema. Gastrointestinal system: Abdomen is nondistended, soft and nontender. No organomegaly or masses felt. Normal bowel sounds heard. Central nervous system: Alert and oriented. No focal neurological deficits. Extremities: Symmetric 5 x 5 power. Skin: No rashes, lesions or ulcers     Data Reviewed: I have personally reviewed following labs and imaging studies  CBC:  Recent Labs Lab 11/26/16 2101 11/27/16 0420 11/28/16 0416 11/29/16 0521  WBC 16.0* 15.7* 11.9* 12.4*  NEUTROABS  --   --  10.4* 10.5*  HGB 9.0* 8.1* 7.7* 9.8*  HCT 27.4* 24.5* 23.3* 29.6*  MCV 87.3 87.2 87.3 85.8  PLT 188 198 175 0000000   Basic Metabolic Panel:  Recent Labs Lab 11/26/16 2101 11/27/16 0420 11/28/16 0416 11/29/16 0521  NA 145 144 148* 147*  K 4.8 3.9 3.5 3.4*  CL 115* 116* 123* 118*  CO2 22 20* 22 23  GLUCOSE 185* 195* 170* 185*  BUN 76* 73* 53* 37*  CREATININE 2.50* 2.32* 1.76* 1.47*  CALCIUM 12.4* 11.8* 11.4* 11.3*   GFR: CrCl cannot be calculated (Unknown ideal weight.). Liver Function Tests:  Recent Labs Lab 11/26/16 2101  AST 57*  ALT 23  ALKPHOS 150*  BILITOT 0.9  PROT 7.1  ALBUMIN 2.8*   No results for input(s): LIPASE, AMYLASE in the last 168 hours. No results for input(s): AMMONIA in the last 168 hours. Coagulation Profile: No results for input(s): INR, PROTIME in the last 168 hours. Cardiac Enzymes: No results for input(s): CKTOTAL, CKMB, CKMBINDEX, TROPONINI in the last 168 hours. BNP (last 3 results) No results for input(s): PROBNP in the last 8760 hours. HbA1C: No results for input(s): HGBA1C in the last 72 hours. CBG:  Recent  Labs Lab 11/28/16 0749 11/28/16 1214 11/28/16 1720 11/28/16 2215 11/29/16 0747  GLUCAP 118* 181* 149* 168* 160*   Lipid Profile: No results for input(s): CHOL, HDL, LDLCALC, TRIG, CHOLHDL, LDLDIRECT in the last 72 hours. Thyroid Function Tests: No results for input(s): TSH, T4TOTAL, FREET4, T3FREE, THYROIDAB in the last 72 hours. Anemia Panel: No results for input(s): VITAMINB12, FOLATE, FERRITIN, TIBC, IRON, RETICCTPCT in the last 72 hours. Sepsis Labs:  Recent Labs Lab 11/26/16 2110 11/28/16 1254  PROCALCITON  --  0.34  LATICACIDVEN 1.52  --     Recent Results (from the past 240 hour(s))  Blood culture (routine x 2)     Status: None (Preliminary result)   Collection Time: 11/26/16  9:37 PM  Result Value Ref Range Status   Specimen Description BLOOD LEFT ARM  Final   Special Requests BOTTLES DRAWN AEROBIC AND ANAEROBIC 5CC  Final   Culture  Final    NO GROWTH 3 DAYS Performed at [facility], [place] [address], [place], [place] [address]    Report Status PENDING  Incomplete  Urine culture     Status: Abnormal (Preliminary result)   Collection Time: 11/26/16 10:05 PM  Result Value Ref Range Status   Specimen Description URINE, CATHETERIZED  Final   Special Requests NONE  Final   Culture (A)  Final    >=100,000 COLONIES/mL ENTEROCOCCUS FAECALIS SUSCEPTIBILITIES TO FOLLOW Performed at [facility], [place] [address], [place], [place] [address]    Report Status PENDING  Incomplete  MRSA PCR Screening     Status: None   Collection Time: 11/27/16  2:35 AM  Result Value Ref Range Status   MRSA by PCR NEGATIVE NEGATIVE Final    Comment:        The GeneXpert MRSA Assay (FDA approved for NASAL specimens only), is one component of a comprehensive MRSA colonization surveillance program. It is not intended to diagnose MRSA infection nor to guide or monitor treatment for MRSA infections.   Blood culture (routine x 2)     Status: None (Preliminary result)    Collection Time: 11/27/16  4:20 AM  Result Value Ref Range Status   Specimen Description BLOOD RIGHT HAND  Final   Special Requests IN PEDIATRIC BOTTLE 2CC  Final   Culture   Final    NO GROWTH 2 DAYS Performed at [facility], [address], [place], [place] [address]    Report Status PENDING  Incomplete         Radiology Studies: Dg Chest 2 View  Result Date: 11/28/2016 CLINICAL DATA:  Follow-up bilateral infiltrates EXAM: CHEST  2 VIEW COMPARISON:  11/26/2016 FINDINGS: Cardiomegaly with vascular congestion. Bibasilar airspace opacities are again noted, right greater than left, unchanged. Small bilateral effusions. No acute bony abnormality. IMPRESSION: Bibasilar airspace opacities and small effusions. Mild cardiomegaly, vascular congestion. No significant change. Electronically Signed   By: Rolm Baptise M.D.   On: 11/28/2016 09:02        Scheduled Meds: . sodium chloride   Intravenous Once  . amLODipine  5 mg Oral Daily  . atropine  1 drop Left Eye BID  . azithromycin  500 mg Intravenous QHS  . cefTRIAXone (ROCEPHIN)  IV  1 g Intravenous QHS  . dorzolamide-timolol  1 drop Both Eyes BID  . ferrous sulfate  325 mg Oral TID WC  . heparin  5,000 Units Subcutaneous Q8H  . insulin aspart  0-9 Units Subcutaneous TID WC  . insulin glargine  6 Units Subcutaneous QHS  . metoprolol tartrate  25 mg Oral BID  . sodium chloride flush  3 mL Intravenous Q12H   Continuous Infusions: . sodium chloride 1,000 mL (11/28/16 0430)     LOS: 2 days       Cobain Morici Gerome Apley, MD [facility] Pager [phone]  If 7PM-7AM, please contact night-coverage [email or web address] Password [facility] 11/29/2016, 12:51 PM

## 2016-11-29 NOTE — Progress Notes (Addendum)
Palliative care progress note  Reason for consult: Goals of care in light of dementia, metastatic cancer, and presumed PNA  I met today with patient's sons Izell  and [name].  We reviewed her clinical course of the past 48 hours including the fact that she is now more awake and has been taking in some nutrition and hydration.  We discussed pathways forward including pursuing placement at skilled facility for attempt at rehabilitation, transitioning home with hospice, and residential hospice. At this point in time, I do not feel that she is likely to meet criteria for residential hospice based upon the fact that she is now more awake and taking in nutrition and hydration.  Her sons are in agreement that it is not realistic to discuss trying to get back home, even with the support of hospice. We talked about outstanding PT referral and they would like to wait until this evaluation occurs to see if she is appropriate for a trial of rehabilitation at skilled facility. If this is the case, this is what they would like to pursue moving forward to see how much functional status she can regain.  We also discussed that, even though she appears to be improving in the short-term, she still has multiple conditions (including widely metastatic cancer) that will continue to progress and she is likely approaching the end of her life regardless of interventions moving forward.  I talked with him about a plan to transition to skilled facility for rehabilitation, but once she has maximized her functional status and is no longer eligible for continued rehabilitation, that she would likely benefit from electing her hospice benefit at that point in time.  I would recommend she have palliative care referral for facility.  If appropriate, please include this on the discharge summary.  Both of her sons expressed understanding and appreciation for guidance moving forward. Will follow up on PT recs and continue conversation as  dictated by her continued clinical course.  Time spent: 40 minutes Greater than 50%  of this time was spent counseling and coordinating care related to the above assessment and plan.  Micheline Rough, MD [place] [facility] [phone]

## 2016-11-30 DIAGNOSIS — Z170 Estrogen receptor positive status [ER+]: Secondary | ICD-10-CM

## 2016-11-30 DIAGNOSIS — I10 Essential (primary) hypertension: Secondary | ICD-10-CM

## 2016-11-30 DIAGNOSIS — Z794 Long term (current) use of insulin: Secondary | ICD-10-CM

## 2016-11-30 DIAGNOSIS — E119 Type 2 diabetes mellitus without complications: Secondary | ICD-10-CM

## 2016-11-30 DIAGNOSIS — C50412 Malignant neoplasm of upper-outer quadrant of left female breast: Secondary | ICD-10-CM

## 2016-11-30 LAB — GLUCOSE, CAPILLARY
GLUCOSE-CAPILLARY: 160 mg/dL — AB (ref 65–99)
GLUCOSE-CAPILLARY: 158 mg/dL — AB (ref 65–99)
Glucose-Capillary: 146 mg/dL — ABNORMAL HIGH (ref 65–99)
Glucose-Capillary: 111 mg/dL — ABNORMAL HIGH (ref 65–99)

## 2016-11-30 LAB — CBC WITH DIFFERENTIAL/PLATELET
BASOS ABS: 0 10*3/uL (ref 0.0–0.1)
Basophils Relative: 0 %
Eosinophils Absolute: 0.1 10*3/uL (ref 0.0–0.7)
Eosinophils Relative: 0 %
HCT: 29.2 % — ABNORMAL LOW (ref 36.0–46.0)
HEMOGLOBIN: 9.9 g/dL — AB (ref 12.0–15.0)
LYMPHS ABS: 0.6 10*3/uL — AB (ref 0.7–4.0)
Lymphocytes Relative: 5 %
MCH: 29.1 pg (ref 26.0–34.0)
MCHC: 33.9 g/dL (ref 30.0–36.0)
MCV: 85.9 fL (ref 78.0–100.0)
Monocytes Absolute: 0.7 10*3/uL (ref 0.1–1.0)
Monocytes Relative: 6 %
NEUTROS PCT: 89 %
Neutro Abs: 10 10*3/uL — ABNORMAL HIGH (ref 1.7–7.7)
Platelets: 176 10*3/uL (ref 150–400)
RBC: 3.4 MIL/uL — AB (ref 3.87–5.11)
RDW: 16.2 % — ABNORMAL HIGH (ref 11.5–15.5)
WBC: 11.3 10*3/uL — AB (ref 4.0–10.5)

## 2016-11-30 LAB — BASIC METABOLIC PANEL
ANION GAP: 5 (ref 5–15)
BUN: 25 mg/dL — AB (ref 6–20)
CHLORIDE: 118 mmol/L — AB (ref 101–111)
CO2: 23 mmol/L (ref 22–32)
Calcium: 11 mg/dL — ABNORMAL HIGH (ref 8.9–10.3)
Creatinine, Ser: 1.28 mg/dL — ABNORMAL HIGH (ref 0.44–1.00)
GFR calc Af Amer: 43 mL/min — ABNORMAL LOW (ref >60)
GFR calc non Af Amer: 37 mL/min — ABNORMAL LOW (ref >60)
GLUCOSE: 175 mg/dL — AB (ref 65–99)
POTASSIUM: 3.3 mmol/L — AB (ref 3.5–5.1)
SODIUM: 146 mmol/L — AB (ref 135–145)

## 2016-11-30 LAB — PROCALCITONIN: Procalcitonin: 0.26 ng/mL

## 2016-11-30 NOTE — Clinical Social Work Placement (Signed)
   CLINICAL SOCIAL WORK PLACEMENT  NOTE  Date:  11/30/2016  Patient Details  Name: [name] MRN: [id number] Date of Birth: 1928/10/01  Clinical Social Work is seeking post-discharge placement for this patient at the [place] level of care (*CSW will initial, date and re-position this form in  chart as items are completed):  No (Spoke with son via phone- pt has been placed in past and hopes for [employer].)   Patient/family provided with [place] Work Department's list of facilities offering this level of care within the geographic area requested by the patient (or if unable, by the patient's family).  Yes   Patient/family informed of their freedom to choose among providers that offer the needed level of care, that participate in Medicare, Medicaid or managed care program needed by the patient, have an available bed and are willing to accept the patient.  Yes   Patient/family informed of [facility]'s ownership interest in [facility] and [facility], as well as of the fact that they are under no obligation to receive care at these facilities.  PASRR submitted to EDS on       PASRR number received on       Existing PASRR number confirmed on 11/30/16     FL2 transmitted to all facilities in geographic area requested by pt/family on 11/30/16     FL2 transmitted to all facilities within larger geographic area on       Patient informed that his/her managed care company has contracts with or will negotiate with certain facilities, including the following:   [facility])         Patient/family informed of bed offers received.  Patient chooses bed at       Physician recommends and patient chooses bed at      Patient to be transferred to   on  .  Patient to be transferred to facility by [facility])     Patient family notified on   of transfer.  Name of family member notified:        PHYSICIAN Please prepare  prescriptions, Please sign FL2, Please sign DNR, Please prepare priority discharge summary, including medications     Additional Comment:    _______________________________________________ Williemae Area, LCSW 11/30/2016, 9:16 PM

## 2016-11-30 NOTE — Progress Notes (Signed)
PROGRESS NOTE    [name]  [id number] DOB: August 15, 1928 DOA: 11/26/2016 PCP: Cathlean Cower, MD   Brief Narrative:  This is a 83 yo female with advanced dementia and metastatic breast cancer, presents to the hospital with decreased responsiveness and lethargy. Her symptoms been ongoing for about 3 weeks with significant decrease in her by mouth intake, now patient is nonambulatory. Patient was found to have a possible pneumonia, started on antibiotics and IV fluids, palliative care has been consulted. Encephalopathy has been improving with supportive care, waiting physical therapy for discharge planing.    Assessment & Plan:   Principal Problem:   Failure to thrive in adult Active Problems:   Insulin dependent diabetes mellitus (HCC)   Normocytic anemia   Depression   Essential hypertension   Chronic kidney disease, stage IV (severe) (HCC)   Chronic diastolic CHF (congestive heart failure) (HCC)   Breast cancer of upper-outer quadrant of left female breast (HCC)   Senile dementia with behavioral disturbance   Acid reflux   AKI (acute kidney injury) ([place])   Lytic bone lesions on xray   Liver metastases (HCC)   Leukocytosis   Metastatic breast cancer (HCC)   Pressure injury of skin   Bilateral pulmonary infiltrates on chest x-ray  1. Metabolic encephalopathy. Able to respond to simple questions and follow commands. Physical therapy evaluation with recommendations for SNF.   2. Community acquired pneumonia. Oxygenating well, will continue antibiotic therapy with azithromycin and ceftriaxone #4, cultures have been no growth. Will plan 8 days of therapy   3. Breast cancer with mets. Poor prognosis, follow with palliative care as outpatient.   4. AKI on ckd stage IV. Renal function stable with cr at baseline, will hold on IV fluids, continue to encourage po intake.   5. Chronic diastolic heart failure.  Continue metoprolol and blood pressure control with amlodipine.  Clinically euvolemic, will hold on further IV fluids.   6, T2DM. Continue glucose cover and monitoring with insulin sliding scale and6 units of levimir at night. Capillary glucose 151-160-158. Tolerating po.   7. HTN. Blood pressure control with amlodipine, metoprolol and as needed hydralazine.   8. Anemia multifactorial plus iron deficiency. Continue iron supplements, patient sp 1 unit prbc transfusion.   9. Multiple pressure ulcers, (present on admission). Stage 2 to 3 ulcers, ranging between 1 and 2 cm. Continue local wound care, per protocol.   DVT prophylaxis:heparin  Code Status:dnr Family Communication: I spoke with patient's family at the bedside and all questions were addressed.  Disposition Plan:snf     Consultants:   Palliative care   Procedures:     Antimicrobials:   ceftriaxone  Azithromycin    Subjective: Patient with no chest pain or dyspnea, no nausea or vomiting. Limited history due to patient's dementia.   Objective: Vitals:   11/29/16 1440 11/29/16 2200 11/30/16 0550 11/30/16 0624  BP: (!) 151/66 (!) 153/69 (!) 159/59   Pulse: 64 66 61   Resp: 18 16 14    Temp: 97.3 F (36.3 C) 98.2 F (36.8 C) 97.5 F (36.4 C)   TempSrc: Oral Axillary    SpO2: 98% 98% 99%   Weight:    70.6 kg (155 lb 10.3 oz)    Intake/Output Summary (Last 24 hours) at 11/30/16 0946 Last data filed at 11/30/16 0655  Gross per 24 hour  Intake              950 ml  Output  0 ml  Net              950 ml   Filed Weights   11/30/16 0624  Weight: 70.6 kg (155 lb 10.3 oz)    Examination:  General exam: not in pain or dyspnea E ENT: no pallor or icterus Respiratory system: Clear to auscultation. Respiratory effort normal. No rales, wheezing or rhonchi.  Cardiovascular system: S1 & S2 heard, RRR. No JVD, murmurs, rubs, gallops or clicks. No pedal edema. Gastrointestinal system: Abdomen is nondistended, soft and nontender. No organomegaly or masses  felt. Normal bowel sounds heard. Central nervous system: Alert and oriented. No focal neurological deficits. Extremities: Symmetric 5 x 5 power. Skin: No rashes, lesions or ulcers  Data Reviewed: I have personally reviewed following labs and imaging studies  CBC:  Recent Labs Lab 11/26/16 2101 11/27/16 0420 11/28/16 0416 11/29/16 0521 11/30/16 0757  WBC 16.0* 15.7* 11.9* 12.4* 11.3*  NEUTROABS  --   --  10.4* 10.5* 10.0*  HGB 9.0* 8.1* 7.7* 9.8* 9.9*  HCT 27.4* 24.5* 23.3* 29.6* 29.2*  MCV 87.3 87.2 87.3 85.8 85.9  PLT 188 198 175 178 0000000   Basic Metabolic Panel:  Recent Labs Lab 11/26/16 2101 11/27/16 0420 11/28/16 0416 11/29/16 0521 11/30/16 0757  NA 145 144 148* 147* 146*  K 4.8 3.9 3.5 3.4* 3.3*  CL 115* 116* 123* 118* 118*  CO2 22 20* 22 23 23   GLUCOSE 185* 195* 170* 185* 175*  BUN 76* 73* 53* 37* 25*  CREATININE 2.50* 2.32* 1.76* 1.47* 1.28*  CALCIUM 12.4* 11.8* 11.4* 11.3* 11.0*   GFR: CrCl cannot be calculated (Unknown ideal weight.). Liver Function Tests:  Recent Labs Lab 11/26/16 2101  AST 57*  ALT 23  ALKPHOS 150*  BILITOT 0.9  PROT 7.1  ALBUMIN 2.8*   No results for input(s): LIPASE, AMYLASE in the last 168 hours. No results for input(s): AMMONIA in the last 168 hours. Coagulation Profile: No results for input(s): INR, PROTIME in the last 168 hours. Cardiac Enzymes: No results for input(s): CKTOTAL, CKMB, CKMBINDEX, TROPONINI in the last 168 hours. BNP (last 3 results) No results for input(s): PROBNP in the last 8760 hours. HbA1C: No results for input(s): HGBA1C in the last 72 hours. CBG:  Recent Labs Lab 11/29/16 0747 11/29/16 1251 11/29/16 1744 11/29/16 2203 11/30/16 0746  GLUCAP 160* 146* 168* 151* 160*   Lipid Profile: No results for input(s): CHOL, HDL, LDLCALC, TRIG, CHOLHDL, LDLDIRECT in the last 72 hours. Thyroid Function Tests: No results for input(s): TSH, T4TOTAL, FREET4, T3FREE, THYROIDAB in the last 72  hours. Anemia Panel: No results for input(s): VITAMINB12, FOLATE, FERRITIN, TIBC, IRON, RETICCTPCT in the last 72 hours. Sepsis Labs:  Recent Labs Lab 11/26/16 2110 11/28/16 1254  PROCALCITON  --  0.34  LATICACIDVEN 1.52  --     Recent Results (from the past 240 hour(s))  Blood culture (routine x 2)     Status: None (Preliminary result)   Collection Time: 11/26/16  9:37 PM  Result Value Ref Range Status   Specimen Description BLOOD LEFT ARM  Final   Special Requests BOTTLES DRAWN AEROBIC AND ANAEROBIC 5CC  Final   Culture   Final    NO GROWTH 3 DAYS Performed at [facility], [place] [address] [place], [place] [address]    Report Status PENDING  Incomplete  Urine culture     Status: Abnormal (Preliminary result)   Collection Time: 11/26/16 10:05 PM  Result Value Ref Range  Status   Specimen Description URINE, CATHETERIZED  Final   Special Requests NONE  Final   Culture (A)  Final    >=100,000 COLONIES/mL ENTEROCOCCUS FAECALIS SUSCEPTIBILITIES TO FOLLOW Performed at [facility], [address] [place], [place] [address]    Report Status PENDING  Incomplete  MRSA PCR Screening     Status: None   Collection Time: 11/27/16  2:35 AM  Result Value Ref Range Status   MRSA by PCR NEGATIVE NEGATIVE Final    Comment:        The GeneXpert MRSA Assay (FDA approved for NASAL specimens only), is one component of a comprehensive MRSA colonization surveillance program. It is not intended to diagnose MRSA infection nor to guide or monitor treatment for MRSA infections.   Blood culture (routine x 2)     Status: None (Preliminary result)   Collection Time: 11/27/16  4:20 AM  Result Value Ref Range Status   Specimen Description BLOOD RIGHT HAND  Final   Special Requests IN PEDIATRIC BOTTLE 2CC  Final   Culture   Final    NO GROWTH 2 DAYS Performed at [facility], [address], [place], [place] [address]    Report Status PENDING  Incomplete          Radiology Studies: No results found.      Scheduled Meds: . sodium chloride   Intravenous Once  . amLODipine  5 mg Oral Daily  . atropine  1 drop Left Eye BID  . azithromycin  500 mg Oral Daily  . cefTRIAXone (ROCEPHIN)  IV  1 g Intravenous QHS  . dorzolamide-timolol  1 drop Both Eyes BID  . ferrous sulfate  325 mg Oral TID WC  . heparin  5,000 Units Subcutaneous Q8H  . insulin aspart  0-9 Units Subcutaneous TID WC  . insulin glargine  6 Units Subcutaneous QHS  . metoprolol tartrate  25 mg Oral BID   Continuous Infusions: . sodium chloride 50 mL/hr at 11/29/16 1406     LOS: 3 days       Josanne Boerema Gerome Apley, MD [facility] Pager [phone]  If 7PM-7AM, please contact night-coverage [email or web address] Password [facility] 11/30/2016, 9:46 AM

## 2016-11-30 NOTE — NC FL2 (Signed)
[place] MEDICAID FL2 LEVEL OF CARE SCREENING TOOL     IDENTIFICATION  Patient Name: [name] Birthdate: 12/09/1927 Sex: female Admission Date (Current Location): 11/26/2016  [facility] and [place] Number:  Herbalist and Address:  [facility],  [place], [place]      Provider Number: [id number]  Attending Physician Name and Address:  Tawni Millers, *  Relative Name and Phone Number:       Current Level of Care: Hospital Recommended Level of Care: [place] Prior Approval Number:    Date Approved/Denied:   PASRR Number: [id number]  Discharge Plan: SNF    Current Diagnoses: Patient Active Problem List   Diagnosis Date Noted  . Bilateral pulmonary infiltrates on chest x-ray   . Pressure injury of skin 11/27/2016  . Leukocytosis   . Metastatic breast cancer ([place])   . Failure to thrive in adult 11/26/2016  . AKI (acute kidney injury) ([place]) 11/26/2016  . Lytic bone lesions on xray 11/26/2016  . Liver metastases ([place]) 11/26/2016  . Preventative health care 02/23/2015  . Hematoma 12/17/2014  . Rash and nonspecific skin eruption 12/17/2014  . Breast cancer of upper-outer quadrant of left female breast ([place]) 11/02/2014  . Desmoid tumor 10/21/2014  . Senile dementia with behavioral disturbance 10/20/2014  . Mass of leg 08/25/2014  . CAP (community acquired pneumonia) 05/06/2014  . Symptomatic bradycardia 05/06/2014  . Hyperkalemia 05/06/2014  . Chronic diastolic CHF (congestive heart failure) ([place]) 05/05/2014  . Acute respiratory failure with hypoxia ([place]) 05/05/2014  . Community acquired pneumonia 11/11/2013  . Arteriosclerosis of coronary artery 09/28/2013  . Diabetes mellitus type 1.5 ([place]) 09/28/2013  . Acid reflux 09/28/2013  . BP (high blood pressure) 09/28/2013  . Atrophy, Fuchs' 09/06/2013  . Chronic kidney disease, stage IV (severe) ([place]) 02/25/2013  . Debility 01/26/2013  . GLAUCOMA  07/19/2009  . CAD, NATIVE VESSEL 02/16/2009  . RENAL ARTERY STENOSIS 02/10/2009  . HEADACHE, TENSION 07/22/2008  . GOUT 09/17/2007  . Normocytic anemia 09/17/2007  . BACK PAIN, CHRONIC 09/17/2007  . Insulin dependent diabetes mellitus ([place]) 07/24/2007  . Hyperlipidemia 07/24/2007  . Depression 07/24/2007  . Essential hypertension 07/24/2007  . GERD 07/24/2007  . Other acquired absence of organ 07/24/2007    Orientation RESPIRATION BLADDER Height & Weight     Self  Normal Incontinent Weight: 155 lb 10.3 oz (70.6 kg) Height:     BEHAVIORAL SYMPTOMS/MOOD NEUROLOGICAL BOWEL NUTRITION STATUS      Incontinent Diet (Dysphagia 1 wih nectar thick liquids)  AMBULATORY STATUS COMMUNICATION OF NEEDS Skin   Extensive Assist Verbally  (Stage 2's to perineum and sacrum, Ecchymosis groin/perineum)                       Personal Care Assistance Level of Assistance  Total care       Total Care Assistance: Maximum assistance   Functional Limitations Info             SPECIAL CARE FACTORS FREQUENCY  PT (By licensed PT)     PT Frequency: 5              Contractures Contractures Info: Not present    Additional Factors Info  Insulin Sliding Scale, Code Status, Allergies Code Status Info: DNR Allergies Info: NKA   Insulin Sliding Scale Info: Novolog Insulin SS       Current Medications (11/30/2016):  This is the current hospital active medication list Current  Facility-Administered Medications  Medication Dose Route Frequency Provider Last Rate Last Dose  . 0.9 %  sodium chloride infusion   Intravenous Once Tawni Millers, MD      . amLODipine (NORVASC) tablet 5 mg  5 mg Oral Daily Mauricio Gerome Apley, MD   5 mg at 11/30/16 1036  . atropine 1 % ophthalmic solution 1 drop  1 drop Left Eye BID Vianne Bulls, MD   1 drop at 11/30/16 1045  . azithromycin (ZITHROMAX) tablet 500 mg  500 mg Oral Daily Mauricio Gerome Apley, MD   500 mg at 11/30/16 1035  .  cefTRIAXone (ROCEPHIN) 1 g in dextrose 5 % 50 mL IVPB  1 g Intravenous QHS Vianne Bulls, MD   1 g at 11/29/16 2322  . dorzolamide-timolol (COSOPT) 22.3-6.8 MG/ML ophthalmic solution 1 drop  1 drop Both Eyes BID Vianne Bulls, MD   1 drop at 11/30/16 1045  . ferrous sulfate tablet 325 mg  325 mg Oral TID WC Mauricio Gerome Apley, MD   325 mg at 11/30/16 1036  . food thickener (THICK IT) powder   Oral PRN Tawni Millers, MD      . heparin injection 5,000 Units  5,000 Units Subcutaneous Q8H Vianne Bulls, MD   5,000 Units at 11/30/16 1754  . hydrALAZINE (APRESOLINE) injection 10 mg  10 mg Intravenous Q4H PRN Ilene Qua Opyd, MD      . insulin aspart (novoLOG) injection 0-9 Units  0-9 Units Subcutaneous TID WC Dionne Milo, NP   1 Units at 11/30/16 1755  . insulin glargine (LANTUS) injection 6 Units  6 Units Subcutaneous QHS Vianne Bulls, MD   6 Units at 11/29/16 2322  . metoprolol tartrate (LOPRESSOR) tablet 25 mg  25 mg Oral BID Tawni Millers, MD   25 mg at 11/30/16 1036  . morphine 2 MG/ML injection 1-2 mg  1-2 mg Intravenous Q2H PRN Ilene Qua Opyd, MD      . ondansetron (ZOFRAN) tablet 4 mg  4 mg Oral Q6H PRN Vianne Bulls, MD       Or  . ondansetron (ZOFRAN) injection 4 mg  4 mg Intravenous Q6H PRN Vianne Bulls, MD         Discharge Medications: Please see discharge summary for a list of discharge medications.  Relevant Imaging Results:  Relevant Lab Results:   Additional Information SSN:  [id number]; patient has Met Breast Ca and Adv Dementia.  No wandering or behaviours.   Williemae Area, LCSW

## 2016-11-30 NOTE — Clinical Social Work Note (Addendum)
Clinical Social Work Assessment  Patient Details  Name: [name] MRN: [id number] Date of Birth: 05-Jan-1928  Date of referral:  11/30/16               Reason for consult:  Facility Placement                Permission sought to share information with:  Facility Sport and exercise psychologist, Family Supports Permission granted to share information::  Yes, Hospital doctor (By son- Kinda Oen)   Housing/Transportation Living arrangements for the past 2 months:  [place] of Information:  Adult Children Air cabin crew) Patient Interpreter Needed:  None Criminal Activity/Legal Involvement Pertinent to Current Situation/Hospitalization:  No - Comment as needed Significant Relationships:  Adult Children Lives with:  Adult Children Do you feel safe going back to the place where you live?  No (Son states family can no longer manage her care at home. ) Need for family participation in patient care:   Yes  Care giving concerns:  Pt was living at home with family; during earlier part of hospital stay- hospice care was anticipated. Pt has "rallied" a little and family desires attempt at [facility] rehab.   Social Worker assessment / plan:  SW spoke with son Maryori Stamour after referral received to assist patient/family with SNF placement. Son indicated that patient has been a resident of [place] several times in the past (greater than 4 years ago) and would like her to return there if possible.  Patient noted to be alert to person only and was unable to clearly communicate her wishes at this time.  Discussed SNF bed search process and son verbalized understanding. Son indicates that family can no longer manage her care at home and desire SNF placement. Fl2 initiated and placed on chart for MD's signature. Per MD- patient is stable for d/c once bed is located. Active bed search in place. Son anticipates need to apply for Medicaid for probably long term care after attempt at rehab.  Per  requests of son- SW contacted Admissions at [facility]. She will review referral and anticipates a bed for patient tomorrow.    Employment status:  Retired Nurse, adult PT Recommendations:  [place] / Referral to community resources:  [place]  Patient/Family's Response to care:  Son is appreciative of current care being provided and was very relieved that SNF placement is being arranged for his mother.   Patient/Family's Understanding of and Emotional Response to Diagnosis, Current Treatment, and Prognosis:  Son was able to verbalize good understanding of patient's current medical needs including treatment and prognosis. Son stated that family was being prepared for hospice care when she was first admitted. While pleased that she has shown some improvement- he is able to understand her medical issues and poor prognosis.    Emotional Assessment Appearance:  Appears stated age Attitude/Demeanor/Rapport:   (Appropriate for age and situation) Affect (typically observed):   (appropriate for age and situation) Orientation:  Oriented to Self Alcohol / Substance use:  Never Used Psych involvement (Current and /or in the community):  No (Comment)  Discharge Needs  Concerns to be addressed:  Care Coordination Readmission within the last 30 days:  No Current discharge risk:  Cognitively Impaired, Dependent with Mobility, Chronically ill, Physical Impairment Barriers to Discharge:  Continued Medical Work up   Kendell Bane T, LCSW 11/30/2016, 9:06 PM

## 2016-11-30 NOTE — Evaluation (Signed)
Physical Therapy Evaluation Patient Details Name: [name] MRN: [id number] DOB: 22-Dec-1927 Today's Date: 11/30/2016   History of Present Illness  83 yo female with advanced dementia and metastatic breast cancer, blind L eye presents to the hospital with decreased responsiveness and lethargy. Her symptoms been ongoing for about 3 weeks with significant decrease in her by mouth intake, now patient is nonambulatory. Dx of CAP, acute encephalopathy, AKI. Pt started on antibiotics and IV fluids, palliative care has been consulted.  Clinical Impression  Pt admitted with above diagnosis. Pt currently with functional limitations due to the deficits listed below (see PT Problem List). Max assist for supine to sit and to pivot to recliner. Prior level of function not clear as pt has dementia and no family present, however prior PT notes from 72 state pt ambulated with RW.  Pt did participate to the best of her abilities considering dementia and blindness. 24* assist at SNF recommended.  Pt will benefit from skilled PT to increase their independence and safety with mobility to allow discharge to the venue listed below.       Follow Up Recommendations SNF;Supervision/Assistance - 24 hour    Equipment Recommendations  Other (comment);Wheelchair (measurements PT);Wheelchair cushion (measurements PT);3in1 (PT) (WC, hoyer lift, 3 in 1 - if pt goes home)    Recommendations for Other Services       Precautions / Restrictions Precautions Precautions: Fall Restrictions Weight Bearing Restrictions: No      Mobility  Bed Mobility Overal bed mobility: Needs Assistance Bed Mobility: Rolling;Sidelying to Sit Rolling: Mod assist Sidelying to sit: Max assist       General bed mobility comments: increased time for sidelying to sit, pt 40%, assist to raise trunk, pt able to advance BLEs over EOB  Transfers Overall transfer level: Needs assistance   Transfers: Sit to/from Stand;Stand Pivot  Transfers Sit to Stand: Max assist Stand pivot transfers: Max assist       General transfer comment: max A to rise and pivot with "bear hug" technique; pt 30%; BLEs buckled  Ambulation/Gait             General Gait Details: NT  Stairs            Wheelchair Mobility    Modified Rankin (Stroke Patients Only)       Balance Overall balance assessment: Needs assistance Sitting-balance support: Feet supported;No upper extremity supported Sitting balance-Leahy Scale: Fair       Standing balance-Leahy Scale: Zero                               Pertinent Vitals/Pain Faces Pain Scale: No hurt    Home Living Family/patient expects to be discharged to:: Unsure Living Arrangements: Children (lives with son)               Additional Comments: pt stated she lives with her 2 daughters, no family present to confirm    Prior Function Level of Independence: Needs assistance         Comments: pt stated she walked "a little" at home with a RW, per chart she's had decline for past couple weeks and was not ambulating just prior to admission     Hand Dominance   Dominant Hand: Right    Extremity/Trunk Assessment        Lower Extremity Assessment Lower Extremity Assessment: Generalized weakness;Difficult to assess due to impaired cognition (AAROM BLEs WFL, limited by difficulty  following directions ? 2* dementia, pt participated with AAROM to B ankles/hips/knees)       Communication   Communication: Other (comment) (sometimes responds to questions/commands, sometimes doesn't)  Cognition   Behavior During Therapy: Flat affect Overall Cognitive Status: No family/caregiver present to determine baseline cognitive functioning                 General Comments: not oriented to location/situation (stated she's at home), oriented to self, able to state birthdate, follows 1 step commands inconsistently    General Comments      Exercises  General Exercises - Lower Extremity Ankle Circles/Pumps: AAROM;Both;10 reps;Supine Heel Slides: AAROM;Both;10 reps;Supine Hip ABduction/ADduction: AAROM;Both;10 reps;Supine   Assessment/Plan    PT Assessment Patient needs continued PT services  PT Problem List Decreased strength;Decreased activity tolerance;Decreased balance;Decreased cognition;Decreased mobility          PT Treatment Interventions Functional mobility training;Balance training;Therapeutic exercise;Therapeutic activities;Patient/family education    PT Goals (Current goals can be found in the Care Plan section)  Acute Rehab PT Goals Patient Stated Goal: none stated, pt not oriented PT Goal Formulation: Patient unable to participate in goal setting Time For Goal Achievement: 12/14/16 Potential to Achieve Goals: Fair    Frequency Min 2X/week   Barriers to discharge        Co-evaluation               End of Session Equipment Utilized During Treatment: Gait belt Activity Tolerance: Patient tolerated treatment well Patient left: in chair;with call bell/phone within reach;with chair alarm set Nurse Communication: Mobility status         Time: [id number] PT Time Calculation (min) (ACUTE ONLY): 22 min   Charges:   PT Evaluation $PT Eval Moderate Complexity: 1 Procedure     PT G CodesPhilomena Doheny 11/30/2016, 10:09 AM [phone]

## 2016-12-01 DIAGNOSIS — J189 Pneumonia, unspecified organism: Principal | ICD-10-CM

## 2016-12-01 LAB — GLUCOSE, CAPILLARY
GLUCOSE-CAPILLARY: 145 mg/dL — AB (ref 65–99)
GLUCOSE-CAPILLARY: 172 mg/dL — AB (ref 65–99)
Glucose-Capillary: 167 mg/dL — ABNORMAL HIGH (ref 65–99)
Glucose-Capillary: 138 mg/dL — ABNORMAL HIGH (ref 65–99)

## 2016-12-01 LAB — CULTURE, BLOOD (ROUTINE X 2): CULTURE: NO GROWTH

## 2016-12-01 LAB — URINE CULTURE: Culture: >=100000 — AB

## 2016-12-01 NOTE — Progress Notes (Signed)
PROGRESS NOTE    [name]  [id number] DOB: 13-May-1928 DOA: 11/26/2016 PCP: Cathlean Cower, MD    Brief Narrative:  This is a 83 yo female with advanced dementia and metastatic breast cancer, presents to the hospital with decreased responsiveness and lethargy. Her symptoms been ongoing for about 3 weeks with significant decrease in her by mouth intake, now patient is nonambulatory. Patient was found to have a possible pneumonia, started on antibiotics and IV fluids, palliative care has been consulted. Encephalopathy has been improving with supportive care. Patient waiting placement at SNF.    Assessment & Plan:   Principal Problem:   Failure to thrive in adult Active Problems:   Insulin dependent diabetes mellitus (HCC)   Normocytic anemia   Depression   Essential hypertension   Chronic kidney disease, stage IV (severe) (HCC)   Chronic diastolic CHF (congestive heart failure) (HCC)   Breast cancer of upper-outer quadrant of left female breast (HCC)   Senile dementia with behavioral disturbance   Acid reflux   AKI (acute kidney injury) ([place])   Lytic bone lesions on xray   Liver metastases (HCC)   Leukocytosis   Metastatic breast cancer (HCC)   Pressure injury of skin   Bilateral pulmonary infiltrates on chest x-ray   1. Metabolic encephalopathy. Patient's son at the bedside. Patient slow to respond to questions. Physical therapy evaluation with recommendations for SNF.   2. Community acquired pneumonia. Oxygenating well. Antibiotic therapy with azithromycin and ceftriaxone #5/8.   3. Breast cancer with mets. Poor prognosis, follow with palliative care as outpatient. Not candidate for hospice facility.   4. AKI on ckd stage IV. Tolerating po well.   5. Chronic diastolic heart failure. Metoprolol and blood pressure control with amlodipine.   6, T2DM. Continue glucose cover and monitoring with insulin sliding scale and6 units of levimir at night. Capillary  glucose 111, 145, 167. Tolerating po.   7. HTN.  amlodipine, metoprolol and as needed hydralazine. Systolic Q000111Q to 0000000.   8. Anemia multifactorial plus iron deficiency. Continue iron supplements, patient sp 1 unit prbc transfusion. Need to check iron panel as outpatient.   9. Multiple pressure ulcers, (present on admission). Stage 2 to 3 ulcers, ranging between 1 and 2 cm. Continue local wound care, per protocol. Plan for SNF at discharge.   DVT prophylaxis:heparin  Code Status:dnr Family Communication: I spoke with patient's family at the bedside and all questions were addressed.  Disposition Plan:snf     Consultants:   Palliative care   Procedures:     Antimicrobials:   ceftriaxone  Azithromycin   Subjective: Patient not in pain or dyspnea, all information from patient's sun at the bedside.   Objective: Vitals:   11/30/16 0624 11/30/16 1345 11/30/16 2123 12/01/16 0616  BP:  (!) 164/78 (!) 156/80 (!) 156/82  Pulse:  63 78 78  Resp:  16 17 17   Temp:  97.9 F (36.6 C) 99.1 F (37.3 C) 98.9 F (37.2 C)  TempSrc:  Oral Oral Oral  SpO2:  98% 99% 100%  Weight: 70.6 kg (155 lb 10.3 oz)   70.4 kg (155 lb 3.3 oz)    Intake/Output Summary (Last 24 hours) at 12/01/16 1359 Last data filed at 11/30/16 2200  Gross per 24 hour  Intake              100 ml  Output                0 ml  Net  100 ml   Filed Weights   11/30/16 0624 12/01/16 0616  Weight: 70.6 kg (155 lb 10.3 oz) 70.4 kg (155 lb 3.3 oz)    Examination:  General exam: not in pain or dyspnea E ENT: no pallor or icterus, oral mucosa moist.  Respiratory system: Clear to auscultation. Decreased inspiratory effort with, decreased breath sounds at bases. . Cardiovascular system: S1 & S2 heard, RRR. No JVD, murmurs, rubs, gallops or clicks. No pedal edema. Gastrointestinal system: Abdomen is nondistended, soft and nontender. No organomegaly or masses felt. Normal bowel sounds  heard. Central nervous system: Alert and oriented. No focal neurological deficits. Extremities: Symmetric 5 x 5 power. Skin: No rashes, lesions or ulcers    Data Reviewed: I have personally reviewed following labs and imaging studies  CBC:  Recent Labs Lab 11/26/16 2101 11/27/16 0420 11/28/16 0416 11/29/16 0521 11/30/16 0757  WBC 16.0* 15.7* 11.9* 12.4* 11.3*  NEUTROABS  --   --  10.4* 10.5* 10.0*  HGB 9.0* 8.1* 7.7* 9.8* 9.9*  HCT 27.4* 24.5* 23.3* 29.6* 29.2*  MCV 87.3 87.2 87.3 85.8 85.9  PLT 188 198 175 178 0000000   Basic Metabolic Panel:  Recent Labs Lab 11/26/16 2101 11/27/16 0420 11/28/16 0416 11/29/16 0521 11/30/16 0757  NA 145 144 148* 147* 146*  K 4.8 3.9 3.5 3.4* 3.3*  CL 115* 116* 123* 118* 118*  CO2 22 20* 22 23 23   GLUCOSE 185* 195* 170* 185* 175*  BUN 76* 73* 53* 37* 25*  CREATININE 2.50* 2.32* 1.76* 1.47* 1.28*  CALCIUM 12.4* 11.8* 11.4* 11.3* 11.0*   GFR: CrCl cannot be calculated (Unknown ideal weight.). Liver Function Tests:  Recent Labs Lab 11/26/16 2101  AST 57*  ALT 23  ALKPHOS 150*  BILITOT 0.9  PROT 7.1  ALBUMIN 2.8*   No results for input(s): LIPASE, AMYLASE in the last 168 hours. No results for input(s): AMMONIA in the last 168 hours. Coagulation Profile: No results for input(s): INR, PROTIME in the last 168 hours. Cardiac Enzymes: No results for input(s): CKTOTAL, CKMB, CKMBINDEX, TROPONINI in the last 168 hours. BNP (last 3 results) No results for input(s): PROBNP in the last 8760 hours. HbA1C: No results for input(s): HGBA1C in the last 72 hours. CBG:  Recent Labs Lab 11/30/16 1212 11/30/16 1736 11/30/16 2120 12/01/16 0754 12/01/16 1206  GLUCAP 158* 146* 111* 145* 167*   Lipid Profile: No results for input(s): CHOL, HDL, LDLCALC, TRIG, CHOLHDL, LDLDIRECT in the last 72 hours. Thyroid Function Tests: No results for input(s): TSH, T4TOTAL, FREET4, T3FREE, THYROIDAB in the last 72 hours. Anemia Panel: No results  for input(s): VITAMINB12, FOLATE, FERRITIN, TIBC, IRON, RETICCTPCT in the last 72 hours. Sepsis Labs:  Recent Labs Lab 11/26/16 2110 11/28/16 1254 11/30/16 0757  PROCALCITON  --  0.34 0.26  LATICACIDVEN 1.52  --   --     Recent Results (from the past 240 hour(s))  Blood culture (routine x 2)     Status: None   Collection Time: 11/26/16  9:37 PM  Result Value Ref Range Status   Specimen Description BLOOD LEFT ARM  Final   Special Requests BOTTLES DRAWN AEROBIC AND ANAEROBIC 5CC  Final   Culture   Final    NO GROWTH 5 DAYS Performed at [facility], [place] [address] [place], [place] [address]    Report Status 12/01/2016 FINAL  Final  Urine culture     Status: Abnormal   Collection Time: 11/26/16 10:05 PM  Result Value Ref  Range Status   Specimen Description URINE, CATHETERIZED  Final   Special Requests NONE  Final   Culture (A)  Final    >=100,000 COLONIES/mL ENTEROCOCCUS FAECALIS RESULT CALLED TO, READ BACK BY AND VERIFIED WITH: C HUGHEY,RN AT 1207 11/30/16 BY L BENFIELD CONCERNING DELAY IN SENSITIVITY RESULTS Performed at [facility], [place] [address], [place], [place] [address]    Report Status 12/01/2016 FINAL  Final   Organism ID, Bacteria ENTEROCOCCUS FAECALIS (A)  Final      Susceptibility   Enterococcus faecalis - MIC*    AMPICILLIN <=2 SENSITIVE Sensitive     LEVOFLOXACIN >=8 RESISTANT Resistant     NITROFURANTOIN <=16 SENSITIVE Sensitive     VANCOMYCIN 1 SENSITIVE Sensitive     * >=100,000 COLONIES/mL ENTEROCOCCUS FAECALIS  MRSA PCR Screening     Status: None   Collection Time: 11/27/16  2:35 AM  Result Value Ref Range Status   MRSA by PCR NEGATIVE NEGATIVE Final    Comment:        The GeneXpert MRSA Assay (FDA approved for NASAL specimens only), is one component of a comprehensive MRSA colonization surveillance program. It is not intended to diagnose MRSA infection nor to guide or monitor treatment for MRSA infections.   Blood culture  (routine x 2)     Status: None (Preliminary result)   Collection Time: 11/27/16  4:20 AM  Result Value Ref Range Status   Specimen Description BLOOD RIGHT HAND  Final   Special Requests IN PEDIATRIC BOTTLE 2CC  Final   Culture   Final    NO GROWTH 4 DAYS Performed at [facility], [place] [address] [place], [place] [address]    Report Status PENDING  Incomplete         Radiology Studies: No results found.      Scheduled Meds: . sodium chloride   Intravenous Once  . amLODipine  5 mg Oral Daily  . atropine  1 drop Left Eye BID  . azithromycin  500 mg Oral Daily  . cefTRIAXone (ROCEPHIN)  IV  1 g Intravenous QHS  . dorzolamide-timolol  1 drop Both Eyes BID  . ferrous sulfate  325 mg Oral TID WC  . heparin  5,000 Units Subcutaneous Q8H  . insulin aspart  0-9 Units Subcutaneous TID WC  . insulin glargine  6 Units Subcutaneous QHS  . metoprolol tartrate  25 mg Oral BID   Continuous Infusions:   LOS: 4 days       Chrishawn Kring Gerome Apley, MD [facility] Pager [phone]  If 7PM-7AM, please contact night-coverage [email or web address] Password [facility] 12/01/2016, 1:59 PM

## 2016-12-02 DIAGNOSIS — B952 Enterococcus as the cause of diseases classified elsewhere: Secondary | ICD-10-CM | POA: Diagnosis not present

## 2016-12-02 DIAGNOSIS — R627 Adult failure to thrive: Secondary | ICD-10-CM | POA: Diagnosis not present

## 2016-12-02 DIAGNOSIS — J189 Pneumonia, unspecified organism: Secondary | ICD-10-CM | POA: Diagnosis not present

## 2016-12-02 DIAGNOSIS — R488 Other symbolic dysfunctions: Secondary | ICD-10-CM | POA: Diagnosis not present

## 2016-12-02 DIAGNOSIS — H409 Unspecified glaucoma: Secondary | ICD-10-CM | POA: Diagnosis not present

## 2016-12-02 DIAGNOSIS — C50919 Malignant neoplasm of unspecified site of unspecified female breast: Secondary | ICD-10-CM | POA: Diagnosis not present

## 2016-12-02 DIAGNOSIS — E119 Type 2 diabetes mellitus without complications: Secondary | ICD-10-CM | POA: Diagnosis not present

## 2016-12-02 DIAGNOSIS — N179 Acute kidney failure, unspecified: Secondary | ICD-10-CM | POA: Diagnosis not present

## 2016-12-02 DIAGNOSIS — E1122 Type 2 diabetes mellitus with diabetic chronic kidney disease: Secondary | ICD-10-CM | POA: Diagnosis not present

## 2016-12-02 DIAGNOSIS — M6281 Muscle weakness (generalized): Secondary | ICD-10-CM | POA: Diagnosis not present

## 2016-12-02 DIAGNOSIS — C787 Secondary malignant neoplasm of liver and intrahepatic bile duct: Secondary | ICD-10-CM | POA: Diagnosis not present

## 2016-12-02 DIAGNOSIS — C50412 Malignant neoplasm of upper-outer quadrant of left female breast: Secondary | ICD-10-CM | POA: Diagnosis not present

## 2016-12-02 DIAGNOSIS — R1312 Dysphagia, oropharyngeal phase: Secondary | ICD-10-CM | POA: Diagnosis not present

## 2016-12-02 DIAGNOSIS — I130 Hypertensive heart and chronic kidney disease with heart failure and stage 1 through stage 4 chronic kidney disease, or unspecified chronic kidney disease: Secondary | ICD-10-CM | POA: Diagnosis not present

## 2016-12-02 DIAGNOSIS — D509 Iron deficiency anemia, unspecified: Secondary | ICD-10-CM | POA: Diagnosis not present

## 2016-12-02 DIAGNOSIS — L89152 Pressure ulcer of sacral region, stage 2: Secondary | ICD-10-CM | POA: Diagnosis not present

## 2016-12-02 DIAGNOSIS — R531 Weakness: Secondary | ICD-10-CM | POA: Diagnosis not present

## 2016-12-02 DIAGNOSIS — G9341 Metabolic encephalopathy: Secondary | ICD-10-CM | POA: Diagnosis not present

## 2016-12-02 DIAGNOSIS — N390 Urinary tract infection, site not specified: Secondary | ICD-10-CM | POA: Diagnosis not present

## 2016-12-02 DIAGNOSIS — I5032 Chronic diastolic (congestive) heart failure: Secondary | ICD-10-CM | POA: Diagnosis not present

## 2016-12-02 DIAGNOSIS — G894 Chronic pain syndrome: Secondary | ICD-10-CM | POA: Diagnosis not present

## 2016-12-02 DIAGNOSIS — N184 Chronic kidney disease, stage 4 (severe): Secondary | ICD-10-CM | POA: Diagnosis not present

## 2016-12-02 LAB — CULTURE, BLOOD (ROUTINE X 2): CULTURE: NO GROWTH

## 2016-12-02 LAB — GLUCOSE, CAPILLARY
GLUCOSE-CAPILLARY: 151 mg/dL — AB (ref 65–99)
Glucose-Capillary: 130 mg/dL — ABNORMAL HIGH (ref 65–99)

## 2016-12-02 LAB — PROCALCITONIN: PROCALCITONIN: 0.28 ng/mL

## 2016-12-02 MED ORDER — FERROUS SULFATE 325 (65 FE) MG PO TABS: 325.0000 mg | ORAL_TABLET | Freq: Three times a day (TID) | ORAL | 0 refills | Status: AC

## 2016-12-02 MED ORDER — STARCH (THICKENING) PO POWD: 1.0000 g | ORAL | 0 refills | Status: AC | PRN

## 2016-12-02 MED ORDER — INSULIN ASPART 100 UNIT/ML ~~LOC~~ SOLN: 0.0000 [IU] | Freq: Three times a day (TID) | SUBCUTANEOUS | 11 refills | Status: AC

## 2016-12-02 MED ORDER — AMOXICILLIN-POT CLAVULANATE 875-125 MG PO TABS: 1.0000 | ORAL_TABLET | Freq: Two times a day (BID) | ORAL | 0 refills | Status: DC

## 2016-12-02 MED ORDER — QUETIAPINE FUMARATE 50 MG PO TABS: 50.0000 mg | ORAL_TABLET | Freq: Every day | ORAL | 0 refills | Status: AC

## 2016-12-02 NOTE — Discharge Summary (Signed)
Physician Discharge Summary  [name] [id number] DOB: Feb 15, 1928 DOA: 11/26/2016  PCP: Cathlean Cower, MD  Admit date: 11/26/2016 Discharge date: 12/02/2016  Admitted From: Home  Disposition:  SNF  Recommendations for Outpatient Follow-up:  1. Follow up with PCP in 1- week.  2. Please encourage po intake 3. Seroquel decreased to 50 mg by mouth daily at bedtime  Home Health: NA Equipment/Devices: NA  Discharge Condition: Stable  CODE STATUS: DNR Diet recommendation: Heart Healthy / Carb Modified                                            Diet recommendations: Dysphagia 1 (puree);Nectar-thick liquid (allow sips of water between meals and after oral care) Liquids provided via: Cup;Straw Medication Administration: Crushed with puree Supervision: Full supervision/cueing for compensatory strategies Compensations: Minimize environmental distractions;Slow rate;Small sips/bites Postural Changes and/or Swallow Maneuvers: Seated upright 90 degrees  Brief/Interim Summary: This is a 83 year old female who presented to the hospital with a chief complaint of poor responsiveness. Patient has history of dementia and breast cancer. Apparently patient's condition had been progressively declining over the last several weeks prior to hospitalization. She refused food and water intermittently. Due to worsening symptoms she was brought into the hospital for further evaluation. On initial physical examination blood pressure 126/64, heart rate 66, respiratory 16, temperature 98.5, oxygen saturation 97%. Patient was obtunded, chronically ill-appearing, her mucous membranes were dry, positive right basilar rails, 2+ edema in lower extremities. Patient appeared to be nonfocal, withdrawal from pain only. Sodium 145, potassium 4.8, chloride 115, bicarbonate 22, glucose 185, BUN 76, creatinine 2.50, calcium 12.4, white count 16.0, hemoglobin 9.0, hematocrit 27.4, platelets 188. Urine analysis negative for  infection. Head CT with no acute intracranial pathology. Small chronic lacunar infarcts of the right basal ganglia and left thalamus. Chest x-ray with a possible right lower lobe infiltrate. Pelvic x-rays with no fractures. EKG with normal sinus rhythm.   The patient was admitted to the hospital working diagnosis of metabolic encephalopathy complicated by acute kidney injury on chronic kidney disease, leukocytosis with possible community acquired pneumonia.  1. Metabolic encephalopathy. Patient was admitted to the medical floor, she responded well to supportive care with IV fluids and IV antibiotics. Seroquel was held during her hospitalization. Currently patient responds to simple questions and follows commands. Will resume Seroquel at half dose 15 mg daily at bedtime. Continue to encourage by mouth intake. Patient with advanced dementia, seen by physical therapy, recommendations for skilled nursing facility. 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 Patient was seen by nutrition and rotating supplements were added. BMI 31.   2. Community-acquired pneumonia. Patient had a follow-up chest x-ray with persistent right lower lobe opacity, procalcitonin was found to be high, patient was treated with antibiotics, azithromycin and ceftriaxone during her hospitalization. Patient will take 3 more days of Augmentin. Patient was seen by speech, recommendation for dysphagia 1 (puree), nectar thick liquids, aspiration precautions. Blood cultures were no growth.  3. Breast cancer with metastasis. Patient was seen by palliative care, family meeting took place. The patient not candidate for inpatient hospice. Physical therapy evaluation recommended skilled nursing facility. Coumadin follow-up with palliative care as an outpatient. Continue anastrazole.   5. Acute kidney injury and chronic kidney disease stage IV. Patient tolerated well IV fluids, no signs of volume overload. Discharge creatinine 1.28.  Will recommend to continue  to encourage by mouth intake. Furosemide and potassium supplements have been held.  6. Hypertension. Patient was continued on amlodipine and metoprolol. By mouth hydralazine has been held to avoid hypotension.  7. Diastolic heart failure. No signs of volume overload., Blood pressure was controlled with amlodipine and metoprolol  8. Iron deficiency anemia with anemia of chronic disease. Patient required 1 unit of packed red blood cells transfusion, that tolerated well, discharge hemoglobin 9.9 hematocrit 29.2. Patient has been placed on iron supplements.   9. Multiple pressure ulcers, present on admission. Stage 2 to 3. Continue local wound care. Frequent turning and out of bed as tolerated.  10. Type 2 diabetes mellitus. Patient was placed on insulin sliding scale plus basal insulin of 6 units of Levemir, Capillary glucose 172, 138, 151.   11. Urinary tract infection, present on admission. Even though her urinalysis had no pyuria her urine culture showed greater than 100,000 colony-forming units of Enterococcus faecalis, sensitive to ampicillin. Difficult to define if patient was symptomatic or not. Patient will finish antibiotic therapy with Augmentin.   Discharge Diagnoses:  Principal Problem:   Failure to thrive in adult Active Problems:   Insulin dependent diabetes mellitus (HCC)   Normocytic anemia   Depression   Essential hypertension   Chronic kidney disease, stage IV (severe) (HCC)   Chronic diastolic CHF (congestive heart failure) (HCC)   Breast cancer of upper-outer quadrant of left female breast ([place])   Senile dementia with behavioral disturbance   Acid reflux   AKI (acute kidney injury) ([place])   Lytic bone lesions on xray   Liver metastases (HCC)   Leukocytosis   Metastatic breast cancer (HCC)   Pressure injury of skin   Bilateral pulmonary infiltrates on chest x-ray    Discharge Instructions   Allergies as of 12/02/2016   No Known  Allergies     Medication List    STOP taking these medications   furosemide 20 MG tablet Commonly known as:  LASIX   hydrALAZINE 100 MG tablet Commonly known as:  APRESOLINE   insulin lispro 100 UNIT/ML injection Commonly known as:  HUMALOG   potassium chloride 10 MEQ tablet Commonly known as:  KLOR-CON M10     TAKE these medications   allopurinol 100 MG tablet Commonly known as:  ZYLOPRIM TAKE 1 TABLET (100 MG TOTAL) BY MOUTH DAILY.   amLODipine 5 MG tablet Commonly known as:  NORVASC Take 1 tablet (5 mg total) by mouth daily.   amoxicillin-clavulanate 875-125 MG tablet Commonly known as:  AUGMENTIN Take 1 tablet by mouth 2 (two) times daily.   anastrozole 1 MG tablet Commonly known as:  ARIMIDEX Take 1 tablet (1 mg total) by mouth daily.   aspirin EC 81 MG tablet Take 81 mg by mouth daily.   brimonidine 0.1 % Soln Commonly known as:  ALPHAGAN P Apply 1 drop to eye 2 (two) times daily.   colchicine 0.6 MG tablet Take 1 tablet (0.6 mg total) by mouth 2 (two) times daily as needed (for gout flare up).   [employer] D3 1000 units capsule Generic drug:  Cholecalciferol TAKE ONE CAPSULE BY MOUTH DAILY   dorzolamide-timolol 22.3-6.8 MG/ML ophthalmic solution Commonly known as:  COSOPT Place 1 drop into both eyes 2 (two) times daily.   ferrous sulfate 325 (65 FE) MG tablet Take 1 tablet (325 mg total) by mouth 3 (three) times daily with meals.   food thickener Powd Commonly known as:  THICK IT Take 1 g by mouth as  needed (food thickener).   gabapentin 600 MG tablet Commonly known as:  NEURONTIN TAKE 1 TABLET BY MOUTH TWICE A DAY   insulin aspart 100 UNIT/ML injection Commonly known as:  novoLOG Inject 0-9 Units into the skin 3 (three) times daily with meals. Glucose 151-200, 2 units, 201-250, 4 units, 251-300, 6 units, 301-350, 8 units, 351 or greater, 9 units and notify physician.   insulin glargine 100 UNIT/ML injection Commonly known as:  LANTUS Inject 0.06  mLs (6 Units total) into the skin at bedtime.   metoprolol tartrate 25 MG tablet Commonly known as:  LOPRESSOR Take 1 tablet (25 mg total) by mouth 2 (two) times daily.   NITROSTAT 0.4 MG SL tablet Generic drug:  nitroGLYCERIN PLACE 1 TABLET (0.4 MG TOTAL) UNDER THE TONGUE AS NEEDED. FOR CHEST PAIN.   nystatin powder Commonly known as:  MYCOSTATIN/NYSTOP Use as directed   pantoprazole 40 MG tablet Commonly known as:  PROTONIX TAKE 1 TABLET (40 MG TOTAL) BY MOUTH DAILY.   pravastatin 80 MG tablet Commonly known as:  PRAVACHOL TAKE 1 TABLET BY MOUTH DAILY   QUEtiapine 50 MG tablet Commonly known as:  SEROQUEL Take 1 tablet (50 mg total) by mouth at bedtime. What changed:  See the new instructions.       No Known Allergies  Consultations:  Palliative Care   Procedures/Studies: Ct Abdomen Pelvis Wo Contrast  Result Date: 11/26/2016 CLINICAL DATA:  Generalize weakness and increased lethargy. Decreased appetite. Altered mental status. EXAM: CT ABDOMEN AND PELVIS WITHOUT CONTRAST TECHNIQUE: Multidetector CT imaging of the abdomen and pelvis was performed following the standard protocol without IV contrast. COMPARISON:  05/08/2014 FINDINGS: Lower chest: Small bilateral pleural effusions with basilar atelectasis in the lungs. Coronary artery and aortic valve calcifications. There is a lobulated somewhat irregular mass in the left breast measuring 4.4 x 5.9 cm. The patient has a history of breast cancer in the left upper-outer quadrant, likely corresponding to this lesion. This may represent a primary or recurrent lesion, depending on previous treatment history. There appears to be an IV catheter site in the left antecubital fossa which is included on the ages of the film. There appears to be extravasation at the IV site with edema or hematoma in the soft tissues and soft tissue gas. Vascular calcifications are demonstrated in the brachial artery. Hepatobiliary: Heterogeneous appearance  of the liver with multiple low-attenuation lesions throughout. These are new since previous study and likely represent metastatic disease. Largest lesion is demonstrated in the lateral segment left lobe of the liver, measuring about 5.8 x 8.2 cm. Lesions are not well characterized on noncontrast imaging. Surgical absence of the gallbladder. No bile duct dilatation. Pancreas: Noncontrast appearance is unremarkable. Spleen: Normal in size without focal abnormality. Adrenals/Urinary Tract: Adrenal glands are unremarkable. Kidneys are normal, without renal calculi, focal lesion, or hydronephrosis. Bladder is unremarkable. Stomach/Bowel: Stomach and small bowel are decompressed. Scattered stool throughout the colon without abnormal distention. Colonic diverticulosis. No evidence of diverticulitis. Appendix is not identified. Vascular/Lymphatic: Aortic atherosclerosis. No enlarged abdominal or pelvic lymph nodes. Reproductive: Calcified fibroids in the uterus. No abnormal adnexal masses. Other: No free air or free fluid in the abdomen. Abdominal wall musculature appears intact. Musculoskeletal: Destructive and expansile bone lesions are demonstrated in bilateral ribs, throughout the pelvis, and with a large expansile destructive lesion in the right inferior pubic ramus. This corresponds to the abnormality demonstrated on recent plain film. Changes are consistent with diffuse bone metastasis. Diffuse degenerative changes throughout the  spine with small scattered lucencies in vertebra. IMPRESSION: 1. Large lobulated mass in the left breast consistent with breast carcinoma. 2. Multiple liver metastases, new since prior study. 3. Multiple bone metastases, including a large destructive lesion in the right inferior pubic ramus, accounting for plain film finding. 4. IV site in the left upper arm has infiltrated with large hematoma/edema and subcutaneous emphysema. These results were called by telephone at the time of  interpretation on 11/26/2016 at 11:19 pm to Dr. Leo Grosser , who verbally acknowledged these results. Electronically Signed   By: Lucienne Capers M.D.   On: 11/26/2016 23:22   Dg Chest 2 View  Result Date: 11/28/2016 CLINICAL DATA:  Follow-up bilateral infiltrates EXAM: CHEST  2 VIEW COMPARISON:  11/26/2016 FINDINGS: Cardiomegaly with vascular congestion. Bibasilar airspace opacities are again noted, right greater than left, unchanged. Small bilateral effusions. No acute bony abnormality. IMPRESSION: Bibasilar airspace opacities and small effusions. Mild cardiomegaly, vascular congestion. No significant change. Electronically Signed   By: Rolm Baptise M.D.   On: 11/28/2016 09:02   Dg Pelvis 1-2 Views  Result Date: 11/26/2016 CLINICAL DATA:  Acute onset of decreased appetite and lethargy. Initial encounter. EXAM: PELVIS - 1-2 VIEW COMPARISON:  CT of the abdomen and pelvis from 05/08/2014 FINDINGS: There is no evidence of fracture or dislocation. There appears to be new absence of much of the right inferior pubic ramus, raising concern for underlying large lytic mass. Both femoral heads are seated normally within their respective acetabula. Mild degenerative change is noted at the lower lumbar spine. The sacroiliac joints are unremarkable in appearance. The visualized bowel gas pattern is grossly unremarkable in appearance. Scattered vascular calcifications are seen. IMPRESSION: No evidence of fracture or dislocation. Apparent new absence of much of the right inferior pubic ramus, raising concern for new underlying large lytic mass. CT or MRI of the pelvis would be helpful for further evaluation, when and as deemed clinically appropriate. Electronically Signed   By: Garald Balding M.D.   On: 11/26/2016 22:49   Ct Head Wo Contrast  Result Date: 11/26/2016 CLINICAL DATA:  Acute onset of generalized weakness and lethargy. Decreased appetite. Assess for brain metastases. Initial encounter. EXAM: CT HEAD  WITHOUT CONTRAST TECHNIQUE: Contiguous axial images were obtained from the base of the skull through the vertex without intravenous contrast. COMPARISON:  CT of the head performed 06/13/2008 FINDINGS: Brain: No evidence of acute infarction, hemorrhage, hydrocephalus, extra-axial collection or mass lesion/mass effect. Prominence of the ventricles and sulci reflects mild to moderate cortical volume loss. Mild chronic ischemic change is suggested at the right cerebellar hemisphere. Scattered periventricular and subcortical white matter change likely reflects small vessel ischemic microangiopathy. Small chronic lacunar infarcts are noted at the right basal ganglia and left thalamus. The brainstem and fourth ventricle are within normal limits. The cerebral hemispheres demonstrate grossly normal gray-white differentiation. No mass effect or midline shift is seen. Vascular: No hyperdense vessel or unexpected calcification. Skull: There is no evidence of fracture; visualized osseous structures are unremarkable in appearance. Sinuses/Orbits: The orbits are within normal limits. The paranasal sinuses and mastoid air cells are well-aerated. Other: No significant soft tissue abnormalities are seen. IMPRESSION: 1. No acute intracranial pathology seen on CT. Evaluation for metastatic disease is limited on CT, but no abnormal edema is seen. 2. Mild to moderate cortical volume loss and scattered small vessel ischemic microangiopathy. 3. Suggestion of mild chronic ischemic change at the right cerebellar hemisphere. Small chronic lacunar infarcts at the right basal ganglia  and left thalamus. Electronically Signed   By: Garald Balding M.D.   On: 11/26/2016 23:52   Dg Abdomen Acute W/chest  Result Date: 11/26/2016 CLINICAL DATA:  Abdominal distention. Patient has been lethargic and not eating. Altered mental status. EXAM: DG ABDOMEN ACUTE W/ 1V CHEST COMPARISON:  Chest 05/09/2014.  CT abdomen and pelvis 05/08/2014 FINDINGS: Shallow  inspiration. Cardiac enlargement with mild pulmonary vascular congestion. Infiltration or edema in the lung bases. No blunting of costophrenic angles. No pneumothorax. Calcified and tortuous aorta. Scattered gas and stool in the colon. No small or large bowel distention. No free intra- abdominal air. Surgical clips in the right upper quadrant. Vascular calcifications. Examination of the pelvis is limited by patient rotation, but there appear to be fractures of the right superior and inferior pubic rami which were not present on the previous CT scan. Acute pelvic fractures are not excluded. IMPRESSION: Cardiac enlargement with vascular congestion and infiltration or edema in the lung bases. Normal nonobstructive bowel gas pattern. No free air. Possible acute fractures of the right superior and inferior pubic rami. Electronically Signed   By: Lucienne Capers M.D.   On: 11/26/2016 21:59    (Echo, Carotid, EGD, Colonoscopy, ERCP)    Subjective: Patient confused, poor oral intake per nursing, no nausea or vomiting.   Discharge Exam: Vitals:   12/01/16 2100 12/02/16 0542  BP: 127/90 138/86  Pulse: 70 74  Resp: 18 20  Temp: 99.2 F (37.3 C) 97.8 F (36.6 C)   Vitals:   12/01/16 0616 12/01/16 1430 12/01/16 2100 12/02/16 0542  BP: (!) 156/82 (!) 154/67 127/90 138/86  Pulse: 78 67 70 74  Resp: 17 14 18 20   Temp: 98.9 F (37.2 C) 98.7 F (37.1 C) 99.2 F (37.3 C) 97.8 F (36.6 C)  TempSrc: Oral Axillary Axillary Axillary  SpO2: 100% 98% 99% 98%  Weight: 70.4 kg (155 lb 3.3 oz)       General: Pt is somnolent, able to respond to simple questions.  Cardiovascular: RRR, S1/S2 +, no rubs, no gallops Respiratory: CTA bilaterally, no wheezing, no rhonchi Abdominal: Soft, NT, ND, bowel sounds + Extremities: no edema, no cyanosis    The results of significant diagnostics from this hospitalization (including imaging, microbiology, ancillary and laboratory) are listed below for reference.      Microbiology: Recent Results (from the past 240 hour(s))  Blood culture (routine x 2)     Status: None   Collection Time: 11/26/16  9:37 PM  Result Value Ref Range Status   Specimen Description BLOOD LEFT ARM  Final   Special Requests BOTTLES DRAWN AEROBIC AND ANAEROBIC 5CC  Final   Culture   Final    NO GROWTH 5 DAYS Performed at [facility], [address], [place], [place] [address]    Report Status 12/01/2016 FINAL  Final  Urine culture     Status: Abnormal   Collection Time: 11/26/16 10:05 PM  Result Value Ref Range Status   Specimen Description URINE, CATHETERIZED  Final   Special Requests NONE  Final   Culture (A)  Final    >=100,000 COLONIES/mL ENTEROCOCCUS FAECALIS RESULT CALLED TO, READ BACK BY AND VERIFIED WITH: C HUGHEY,RN AT 1207 11/30/16 BY L BENFIELD CONCERNING DELAY IN SENSITIVITY RESULTS Performed at [facility], [place] [address], [place], [place] [address]    Report Status 12/01/2016 FINAL  Final   Organism ID, Bacteria ENTEROCOCCUS FAECALIS (A)  Final      Susceptibility  Enterococcus faecalis - MIC*    AMPICILLIN <=2 SENSITIVE Sensitive     LEVOFLOXACIN >=8 RESISTANT Resistant     NITROFURANTOIN <=16 SENSITIVE Sensitive     VANCOMYCIN 1 SENSITIVE Sensitive     * >=100,000 COLONIES/mL ENTEROCOCCUS FAECALIS  MRSA PCR Screening     Status: None   Collection Time: 11/27/16  2:35 AM  Result Value Ref Range Status   MRSA by PCR NEGATIVE NEGATIVE Final    Comment:        The GeneXpert MRSA Assay (FDA approved for NASAL specimens only), is one component of a comprehensive MRSA colonization surveillance program. It is not intended to diagnose MRSA infection nor to guide or monitor treatment for MRSA infections.   Blood culture (routine x 2)     Status: None (Preliminary result)   Collection Time: 11/27/16  4:20 AM  Result Value Ref Range Status   Specimen Description BLOOD RIGHT HAND  Final   Special Requests IN PEDIATRIC BOTTLE 2CC   Final   Culture   Final    NO GROWTH 4 DAYS Performed at [facility], [place] [address] [place], [place] [address]    Report Status PENDING  Incomplete     Labs: BNP (last 3 results) No results for input(s): BNP in the last 8760 hours. Basic Metabolic Panel:  Recent Labs Lab 11/26/16 2101 11/27/16 0420 11/28/16 0416 11/29/16 0521 11/30/16 0757  NA 145 144 148* 147* 146*  K 4.8 3.9 3.5 3.4* 3.3*  CL 115* 116* 123* 118* 118*  CO2 22 20* 22 23 23   GLUCOSE 185* 195* 170* 185* 175*  BUN 76* 73* 53* 37* 25*  CREATININE 2.50* 2.32* 1.76* 1.47* 1.28*  CALCIUM 12.4* 11.8* 11.4* 11.3* 11.0*   Liver Function Tests:  Recent Labs Lab 11/26/16 2101  AST 57*  ALT 23  ALKPHOS 150*  BILITOT 0.9  PROT 7.1  ALBUMIN 2.8*   No results for input(s): LIPASE, AMYLASE in the last 168 hours. No results for input(s): AMMONIA in the last 168 hours. CBC:  Recent Labs Lab 11/26/16 2101 11/27/16 0420 11/28/16 0416 11/29/16 0521 11/30/16 0757  WBC 16.0* 15.7* 11.9* 12.4* 11.3*  NEUTROABS  --   --  10.4* 10.5* 10.0*  HGB 9.0* 8.1* 7.7* 9.8* 9.9*  HCT 27.4* 24.5* 23.3* 29.6* 29.2*  MCV 87.3 87.2 87.3 85.8 85.9  PLT 188 198 175 178 176   Cardiac Enzymes: No results for input(s): CKTOTAL, CKMB, CKMBINDEX, TROPONINI in the last 168 hours. BNP: Invalid input(s): POCBNP CBG:  Recent Labs Lab 12/01/16 0754 12/01/16 1206 12/01/16 1648 12/01/16 2055 12/02/16 0747  GLUCAP 145* 167* 172* 138* 151*   D-Dimer No results for input(s): DDIMER in the last 72 hours. Hgb A1c No results for input(s): HGBA1C in the last 72 hours. Lipid Profile No results for input(s): CHOL, HDL, LDLCALC, TRIG, CHOLHDL, LDLDIRECT in the last 72 hours. Thyroid function studies No results for input(s): TSH, T4TOTAL, T3FREE, THYROIDAB in the last 72 hours.  Invalid input(s): FREET3 Anemia work up No results for input(s): VITAMINB12, FOLATE, FERRITIN, TIBC, IRON, RETICCTPCT in the last 72  hours. Urinalysis    Component Value Date/Time   COLORURINE YELLOW 11/26/2016 2205   APPEARANCEUR CLOUDY (A) 11/26/2016 2205   LABSPEC 1.011 11/26/2016 2205   PHURINE 5.0 11/26/2016 2205   GLUCOSEU NEGATIVE 11/26/2016 2205   GLUCOSEU NEGATIVE 11/09/2015 1645   HGBUR SMALL (A) 11/26/2016 2205   BILIRUBINUR NEGATIVE 11/26/2016 2205   KETONESUR NEGATIVE 11/26/2016 2205  PROTEINUR 30 (A) 11/26/2016 2205   UROBILINOGEN 0.2 11/09/2015 1645   NITRITE NEGATIVE 11/26/2016 2205   LEUKOCYTESUR NEGATIVE 11/26/2016 2205   Sepsis Labs Invalid input(s): PROCALCITONIN,  WBC,  LACTICIDVEN Microbiology Recent Results (from the past 240 hour(s))  Blood culture (routine x 2)     Status: None   Collection Time: 11/26/16  9:37 PM  Result Value Ref Range Status   Specimen Description BLOOD LEFT ARM  Final   Special Requests BOTTLES DRAWN AEROBIC AND ANAEROBIC 5CC  Final   Culture   Final    NO GROWTH 5 DAYS Performed at [facility], [place] [address], [place], [place] [address]    Report Status 12/01/2016 FINAL  Final  Urine culture     Status: Abnormal   Collection Time: 11/26/16 10:05 PM  Result Value Ref Range Status   Specimen Description URINE, CATHETERIZED  Final   Special Requests NONE  Final   Culture (A)  Final    >=100,000 COLONIES/mL ENTEROCOCCUS FAECALIS RESULT CALLED TO, READ BACK BY AND VERIFIED WITH: C HUGHEY,RN AT 1207 11/30/16 BY L BENFIELD CONCERNING DELAY IN SENSITIVITY RESULTS Performed at [facility], [place] [address], [place], [place] [address]    Report Status 12/01/2016 FINAL  Final   Organism ID, Bacteria ENTEROCOCCUS FAECALIS (A)  Final      Susceptibility   Enterococcus faecalis - MIC*    AMPICILLIN <=2 SENSITIVE Sensitive     LEVOFLOXACIN >=8 RESISTANT Resistant     NITROFURANTOIN <=16 SENSITIVE Sensitive     VANCOMYCIN 1 SENSITIVE Sensitive     * >=100,000 COLONIES/mL ENTEROCOCCUS FAECALIS  MRSA PCR Screening     Status: None   Collection Time:  11/27/16  2:35 AM  Result Value Ref Range Status   MRSA by PCR NEGATIVE NEGATIVE Final    Comment:        The GeneXpert MRSA Assay (FDA approved for NASAL specimens only), is one component of a comprehensive MRSA colonization surveillance program. It is not intended to diagnose MRSA infection nor to guide or monitor treatment for MRSA infections.   Blood culture (routine x 2)     Status: None (Preliminary result)   Collection Time: 11/27/16  4:20 AM  Result Value Ref Range Status   Specimen Description BLOOD RIGHT HAND  Final   Special Requests IN PEDIATRIC BOTTLE 2CC  Final   Culture   Final    NO GROWTH 4 DAYS Performed at [place] [facility], [place] [address], [place], [place] [address]    Report Status PENDING  Incomplete     Time coordinating discharge: 45 minutes  SIGNED:   Tawni Millers, MD  [facility] 12/02/2016, 10:00 AM Pager   If 7PM-7AM, please contact night-coverage [email or web address] Password [email or web address]

## 2016-12-02 NOTE — Progress Notes (Addendum)
Pt discharged via [facility] to [place]. Name and number given to [place] at [place]. Unable to reach RN. Nurse to call if there are questions.

## 2016-12-02 NOTE — Progress Notes (Signed)
Attempted to call report x 2 to [facility].  No answer.  Will attempt again once transport arrives.

## 2016-12-03 ENCOUNTER — Telehealth: Payer: Self-pay | Admitting: *Deleted

## 2016-12-03 NOTE — Telephone Encounter (Signed)
Pt was on TCM list admitted for poor responsive. Pt has hx of dementia & breast cancer. Conditions been getting progressively worse not eating or drinking. Pt has been D/C to SNF. In notes it does states to f/u w/PCP 1 week after being release from SNF...[name]

## 2016-12-04 ENCOUNTER — Non-Acute Institutional Stay (SKILLED_NURSING_FACILITY): Payer: Medicare Other | Admitting: Nurse Practitioner

## 2016-12-04 ENCOUNTER — Encounter: Payer: Self-pay | Admitting: Nurse Practitioner

## 2016-12-04 DIAGNOSIS — Z794 Long term (current) use of insulin: Secondary | ICD-10-CM | POA: Diagnosis not present

## 2016-12-04 DIAGNOSIS — E119 Type 2 diabetes mellitus without complications: Secondary | ICD-10-CM

## 2016-12-04 DIAGNOSIS — N179 Acute kidney failure, unspecified: Secondary | ICD-10-CM | POA: Diagnosis not present

## 2016-12-04 DIAGNOSIS — R627 Adult failure to thrive: Secondary | ICD-10-CM

## 2016-12-04 DIAGNOSIS — R531 Weakness: Secondary | ICD-10-CM | POA: Diagnosis not present

## 2016-12-04 DIAGNOSIS — C50919 Malignant neoplasm of unspecified site of unspecified female breast: Secondary | ICD-10-CM | POA: Diagnosis not present

## 2016-12-04 DIAGNOSIS — J189 Pneumonia, unspecified organism: Secondary | ICD-10-CM

## 2016-12-04 DIAGNOSIS — IMO0001 Reserved for inherently not codable concepts without codable children: Secondary | ICD-10-CM

## 2016-12-04 NOTE — Progress Notes (Signed)
Nursing Home Location:  [facility]   Place of Service: SNF (31)  PCP: Cathlean Cower, MD  No Known Allergies  Chief Complaint  Patient presents with  . Hospitalization Follow-up    [facility] stay 11/26/16 to 12/02/16    HPI:  Patient is a 83 y.o. female seen today at [facility] to follow up hospitalization. Pt with hx of dementia and breast cancer. Per notes patient's condition had been progressively declining over the last several weeks prior to hospitalization and she was refusing water and food. Pt was obtunded when she was brought to the ED with abnormal breath sounds and 2+ edema in LE and would withdrawal from pain only. Patient appeared to be nonfocal, withdrawal from pain only. Sodium 145, potassium 4.8, chloride 115, bicarbonate 22, glucose 185, BUN 76, creatinine 2.50, calcium 12.4, white count 16.0, hemoglobin 9.0, hematocrit 27.4, platelets 188. Urine analysis negative for infection. Head CT with no acute intracranial pathology. Small chronic lacunar infarcts of the right basal ganglia and left thalamus. Chest x-ray with a possible right lower lobe infiltrate. Pelvic x-rays with no fractures. EKG with normal sinus rhythm. She was admitted to the hospital working diagnosis of metabolic encephalopathy complicated by acute kidney injury on chronic kidney disease, leukocytosis with possible community acquired pneumonia. She responded well to IV fluids and IV antibiotics (needing 3 more days of Augmentin due to CAP). Seroquel was adjusted to half dose. PT recommended SNF.  While in hospital palliative care was following due to metastatic breast cancer which was continued at [facility].  Pt also noted to have multiple pressure ulcer stage 2-3 on admission to hospital.  Pt is seen today with significant decline since admission. She is currently nonverbal and does not follow commands. palliative care NP has also seen pt.  No signs of pain at this time.  Pt is unable to  swallow.   Review of Systems:  Review of Systems  Unable to perform ROS: Patient unresponsive    Past Medical History:  Diagnosis Date  . Abdominal pain   . Anemia, unspecified   . Back pain, chronic   . CHF (congestive heart failure) ([place])   . Coronary artery disease   . Depression   . Diabetes mellitus   . Dyslipidemia   . GERD (gastroesophageal reflux disease)   . Gout   . History of cholecystectomy   . History of tonsillectomy   . Hypertension   . IDDM (insulin dependent diabetes mellitus) ([place])   . Renal artery stenosis ([place])   . Tension headache    Past Surgical History:  Procedure Laterality Date  . CHOLECYSTECTOMY  40 years ago  . TONSILLECTOMY     Social History:   reports that she has never smoked. She has never used smokeless tobacco. She reports that she does not drink alcohol or use drugs.  Family History  Problem Relation Age of Onset  . Coronary artery disease Father   . Hypertension Father   . Heart attack Father   . Hyperlipidemia Father   . Diabetes Sister   . Hypertension Sister   . Hyperlipidemia Sister   . Hypertension Brother   . Diabetes Brother   . Hyperlipidemia Brother   . Cancer Neg Hx     Breast and colon    Medications: Patient's Medications  New Prescriptions   No medications on file  Previous Medications   ALLOPURINOL (ZYLOPRIM) 100 MG TABLET    TAKE 1 TABLET (100 MG TOTAL) BY  MOUTH DAILY.   AMLODIPINE (NORVASC) 5 MG TABLET    Take 1 tablet (5 mg total) by mouth daily.   ANASTROZOLE (ARIMIDEX) 1 MG TABLET    Take 1 tablet (1 mg total) by mouth daily.   ASPIRIN EC 81 MG TABLET    Take 81 mg by mouth daily.   BRIMONIDINE (ALPHAGAN P) 0.1 % SOLN    Apply 1 drop to eye 2 (two) times daily.   COLCHICINE 0.6 MG TABLET    Take 1 tablet (0.6 mg total) by mouth 2 (two) times daily as needed (for gout flare up).   [employer] D3 1000 UNITS CAPSULE    TAKE ONE CAPSULE BY MOUTH DAILY   DORZOLAMIDE-TIMOLOL (COSOPT) 22.3-6.8 MG/ML OPHTHALMIC  SOLUTION    Place 1 drop into both eyes 2 (two) times daily.   FERROUS SULFATE 325 (65 FE) MG TABLET    Take 1 tablet (325 mg total) by mouth 3 (three) times daily with meals.   FOOD THICKENER (THICK IT) POWD    Take 1 g by mouth as needed (food thickener).   GABAPENTIN (NEURONTIN) 600 MG TABLET    TAKE 1 TABLET BY MOUTH TWICE A DAY   INSULIN ASPART (NOVOLOG) 100 UNIT/ML INJECTION    Inject 0-9 Units into the skin 3 (three) times daily with meals. Glucose 151-200, 2 units, 201-250, 4 units, 251-300, 6 units, 301-350, 8 units, 351 or greater, 9 units and notify physician.   INSULIN GLARGINE (LANTUS) 100 UNIT/ML INJECTION    Inject 0.06 mLs (6 Units total) into the skin at bedtime.   METOPROLOL TARTRATE (LOPRESSOR) 25 MG TABLET    Take 1 tablet (25 mg total) by mouth 2 (two) times daily.   NITROSTAT 0.4 MG SL TABLET    PLACE 1 TABLET (0.4 MG TOTAL) UNDER THE TONGUE AS NEEDED. FOR CHEST PAIN.   NYSTATIN (MYCOSTATIN) POWDER    Use as directed   PANTOPRAZOLE (PROTONIX) 40 MG TABLET    TAKE 1 TABLET (40 MG TOTAL) BY MOUTH DAILY.   PRAVASTATIN (PRAVACHOL) 80 MG TABLET    TAKE 1 TABLET BY MOUTH DAILY   QUETIAPINE (SEROQUEL) 50 MG TABLET    Take 1 tablet (50 mg total) by mouth at bedtime.  Modified Medications   No medications on file  Discontinued Medications   AMOXICILLIN-CLAVULANATE (AUGMENTIN) 875-125 MG TABLET    Take 1 tablet by mouth 2 (two) times daily.     Physical Exam: Vitals:   12/04/16 1220  BP: 114/66  Pulse: 98  Resp: 18  Temp: 98.1 F (36.7 C)  SpO2: 93%  Weight: 155 lb 3.2 oz (70.4 kg)  Height: 5\' 3"  (1.6 m)    Physical Exam  Constitutional: No distress.  Ill appearing female  Eyes:  Clouded lens bilaterally  Neck: Normal range of motion. Neck supple.  Cardiovascular: Normal rate, regular rhythm and normal heart sounds.   Pulmonary/Chest: Effort normal.  diminished throughout   Abdominal: Soft. Bowel sounds are normal. She exhibits no distension. There is no  tenderness.  Musculoskeletal: She exhibits no edema or tenderness.  Neurological: She is unresponsive.  Skin: Skin is warm.    Labs reviewed: Basic Metabolic Panel:  Recent Labs  11/28/16 0416 11/29/16 0521 11/30/16 0757  NA 148* 147* 146*  K 3.5 3.4* 3.3*  CL 123* 118* 118*  CO2 22 23 23   GLUCOSE 170* 185* 175*  BUN 53* 37* 25*  CREATININE 1.76* 1.47* 1.28*  CALCIUM 11.4* 11.3* 11.0*   Liver Function Tests:  Recent Labs  06/21/16 1521 11/26/16 2101  AST 13 57*  ALT 7 23  ALKPHOS 105 150*  BILITOT 0.3 0.9  PROT 6.6 7.1  ALBUMIN 3.4* 2.8*   No results for input(s): LIPASE, AMYLASE in the last 8760 hours. No results for input(s): AMMONIA in the last 8760 hours. CBC:  Recent Labs  11/28/16 0416 11/29/16 0521 11/30/16 0757  WBC 11.9* 12.4* 11.3*  NEUTROABS 10.4* 10.5* 10.0*  HGB 7.7* 9.8* 9.9*  HCT 23.3* 29.6* 29.2*  MCV 87.3 85.8 85.9  PLT 175 178 176   TSH: No results for input(s): TSH in the last 8760 hours. A1C: Lab Results  Component Value Date   HGBA1C 5.6 06/21/2016   Lipid Panel:  Recent Labs  06/21/16 1521  CHOL 132  HDL 37.90*  LDLCALC 71  TRIG 117.0  CHOLHDL 3    Radiological Exams Ct Abdomen Pelvis Wo Contrast  Result Date: 11/26/2016 CLINICAL DATA:  Generalize weakness and increased lethargy. Decreased appetite. Altered mental status. EXAM: CT ABDOMEN AND PELVIS WITHOUT CONTRAST TECHNIQUE: Multidetector CT imaging of the abdomen and pelvis was performed following the standard protocol without IV contrast. COMPARISON:  05/08/2014 FINDINGS: Lower chest: Small bilateral pleural effusions with basilar atelectasis in the lungs. Coronary artery and aortic valve calcifications. There is a lobulated somewhat irregular mass in the left breast measuring 4.4 x 5.9 cm. The patient has a history of breast cancer in the left upper-outer quadrant, likely corresponding to this lesion. This may represent a primary or recurrent lesion, depending on  previous treatment history. There appears to be an IV catheter site in the left antecubital fossa which is included on the ages of the film. There appears to be extravasation at the IV site with edema or hematoma in the soft tissues and soft tissue gas. Vascular calcifications are demonstrated in the brachial artery. Hepatobiliary: Heterogeneous appearance of the liver with multiple low-attenuation lesions throughout. These are new since previous study and likely represent metastatic disease. Largest lesion is demonstrated in the lateral segment left lobe of the liver, measuring about 5.8 x 8.2 cm. Lesions are not well characterized on noncontrast imaging. Surgical absence of the gallbladder. No bile duct dilatation. Pancreas: Noncontrast appearance is unremarkable. Spleen: Normal in size without focal abnormality. Adrenals/Urinary Tract: Adrenal glands are unremarkable. Kidneys are normal, without renal calculi, focal lesion, or hydronephrosis. Bladder is unremarkable. Stomach/Bowel: Stomach and small bowel are decompressed. Scattered stool throughout the colon without abnormal distention. Colonic diverticulosis. No evidence of diverticulitis. Appendix is not identified. Vascular/Lymphatic: Aortic atherosclerosis. No enlarged abdominal or pelvic lymph nodes. Reproductive: Calcified fibroids in the uterus. No abnormal adnexal masses. Other: No free air or free fluid in the abdomen. Abdominal wall musculature appears intact. Musculoskeletal: Destructive and expansile bone lesions are demonstrated in bilateral ribs, throughout the pelvis, and with a large expansile destructive lesion in the right inferior pubic ramus. This corresponds to the abnormality demonstrated on recent plain film. Changes are consistent with diffuse bone metastasis. Diffuse degenerative changes throughout the spine with small scattered lucencies in vertebra. IMPRESSION: 1. Large lobulated mass in the left breast consistent with breast  carcinoma. 2. Multiple liver metastases, new since prior study. 3. Multiple bone metastases, including a large destructive lesion in the right inferior pubic ramus, accounting for plain film finding. 4. IV site in the left upper arm has infiltrated with large hematoma/edema and subcutaneous emphysema. These results were called by telephone at the time of interpretation on 11/26/2016 at 11:19 pm to Dr. Quillian Quince  KNOTT , who verbally acknowledged these results. Electronically Signed   By: Lucienne Capers M.D.   On: 11/26/2016 23:22   Dg Pelvis 1-2 Views  Result Date: 11/26/2016 CLINICAL DATA:  Acute onset of decreased appetite and lethargy. Initial encounter. EXAM: PELVIS - 1-2 VIEW COMPARISON:  CT of the abdomen and pelvis from 05/08/2014 FINDINGS: There is no evidence of fracture or dislocation. There appears to be new absence of much of the right inferior pubic ramus, raising concern for underlying large lytic mass. Both femoral heads are seated normally within their respective acetabula. Mild degenerative change is noted at the lower lumbar spine. The sacroiliac joints are unremarkable in appearance. The visualized bowel gas pattern is grossly unremarkable in appearance. Scattered vascular calcifications are seen. IMPRESSION: No evidence of fracture or dislocation. Apparent new absence of much of the right inferior pubic ramus, raising concern for new underlying large lytic mass. CT or MRI of the pelvis would be helpful for further evaluation, when and as deemed clinically appropriate. Electronically Signed   By: Garald Balding M.D.   On: 11/26/2016 22:49   Ct Head Wo Contrast  Result Date: 11/26/2016 CLINICAL DATA:  Acute onset of generalized weakness and lethargy. Decreased appetite. Assess for brain metastases. Initial encounter. EXAM: CT HEAD WITHOUT CONTRAST TECHNIQUE: Contiguous axial images were obtained from the base of the skull through the vertex without intravenous contrast. COMPARISON:  CT of the  head performed 06/13/2008 FINDINGS: Brain: No evidence of acute infarction, hemorrhage, hydrocephalus, extra-axial collection or mass lesion/mass effect. Prominence of the ventricles and sulci reflects mild to moderate cortical volume loss. Mild chronic ischemic change is suggested at the right cerebellar hemisphere. Scattered periventricular and subcortical white matter change likely reflects small vessel ischemic microangiopathy. Small chronic lacunar infarcts are noted at the right basal ganglia and left thalamus. The brainstem and fourth ventricle are within normal limits. The cerebral hemispheres demonstrate grossly normal gray-white differentiation. No mass effect or midline shift is seen. Vascular: No hyperdense vessel or unexpected calcification. Skull: There is no evidence of fracture; visualized osseous structures are unremarkable in appearance. Sinuses/Orbits: The orbits are within normal limits. The paranasal sinuses and mastoid air cells are well-aerated. Other: No significant soft tissue abnormalities are seen. IMPRESSION: 1. No acute intracranial pathology seen on CT. Evaluation for metastatic disease is limited on CT, but no abnormal edema is seen. 2. Mild to moderate cortical volume loss and scattered small vessel ischemic microangiopathy. 3. Suggestion of mild chronic ischemic change at the right cerebellar hemisphere. Small chronic lacunar infarcts at the right basal ganglia and left thalamus. Electronically Signed   By: Garald Balding M.D.   On: 11/26/2016 23:52   Dg Abdomen Acute W/chest  Result Date: 11/26/2016 CLINICAL DATA:  Abdominal distention. Patient has been lethargic and not eating. Altered mental status. EXAM: DG ABDOMEN ACUTE W/ 1V CHEST COMPARISON:  Chest 05/09/2014.  CT abdomen and pelvis 05/08/2014 FINDINGS: Shallow inspiration. Cardiac enlargement with mild pulmonary vascular congestion. Infiltration or edema in the lung bases. No blunting of costophrenic angles. No  pneumothorax. Calcified and tortuous aorta. Scattered gas and stool in the colon. No small or large bowel distention. No free intra- abdominal air. Surgical clips in the right upper quadrant. Vascular calcifications. Examination of the pelvis is limited by patient rotation, but there appear to be fractures of the right superior and inferior pubic rami which were not present on the previous CT scan. Acute pelvic fractures are not excluded. IMPRESSION: Cardiac enlargement with vascular congestion  and infiltration or edema in the lung bases. Normal nonobstructive bowel gas pattern. No free air. Possible acute fractures of the right superior and inferior pubic rami. Electronically Signed   By: Lucienne Capers M.D.   On: 11/26/2016 21:59    Assessment/Plan 1. Community acquired pneumonia, unspecified laterality 2. AKI (acute kidney injury) ([place]) 3. Failure to thrive in adult 4. Metastatic breast cancer ([place])  Pt with metastatic breast cancer with FTT and now unresponsive, palliative care involved in hospital.  Care plan meeting in facility was done, palliative care NP spoke with family at that time. Now with changes in mental status hospice as been recommended. Hospice consult ordered. All PO medication will be dc'd since she is unable to swallow. Family would like comfort measures only. Roxanol 5 mg q 8 hours PRN ordered. Atropine drops 2 drops SL q 2 hours as needed secretions.    5. Insulin dependent diabetes mellitus (HCC) Will dc insulin as she is not eating. Comfort measures only     Nicko Daher K. Harle Battiest  [facility] [phone] 8 am - 5 pm) [phone] (after hours)

## 2016-12-05 DIAGNOSIS — R531 Weakness: Secondary | ICD-10-CM | POA: Diagnosis not present

## 2016-12-12 DEATH — deceased
